# Patient Record
Sex: Female | Born: 1935
Health system: Southern US, Community
[De-identification: ages and names within clinical notes are randomized; demographics above are authoritative.]

## PROBLEM LIST (undated history)

## (undated) DIAGNOSIS — I509 Heart failure, unspecified: Secondary | ICD-10-CM

## (undated) DIAGNOSIS — Z86718 Personal history of other venous thrombosis and embolism: Secondary | ICD-10-CM

## (undated) DIAGNOSIS — I639 Cerebral infarction, unspecified: Secondary | ICD-10-CM

## (undated) DIAGNOSIS — Z8711 Personal history of peptic ulcer disease: Secondary | ICD-10-CM

## (undated) DIAGNOSIS — G43909 Migraine, unspecified, not intractable, without status migrainosus: Secondary | ICD-10-CM

## (undated) DIAGNOSIS — I10 Essential (primary) hypertension: Secondary | ICD-10-CM

## (undated) DIAGNOSIS — R634 Abnormal weight loss: Secondary | ICD-10-CM

## (undated) DIAGNOSIS — N3090 Cystitis, unspecified without hematuria: Secondary | ICD-10-CM

## (undated) DIAGNOSIS — J9620 Acute and chronic respiratory failure, unspecified whether with hypoxia or hypercapnia: Secondary | ICD-10-CM

## (undated) DIAGNOSIS — K625 Hemorrhage of anus and rectum: Secondary | ICD-10-CM

## (undated) DIAGNOSIS — I999 Unspecified disorder of circulatory system: Secondary | ICD-10-CM

## (undated) DIAGNOSIS — K279 Peptic ulcer, site unspecified, unspecified as acute or chronic, without hemorrhage or perforation: Secondary | ICD-10-CM

## (undated) DIAGNOSIS — G4700 Insomnia, unspecified: Secondary | ICD-10-CM

## (undated) DIAGNOSIS — J449 Chronic obstructive pulmonary disease, unspecified: Secondary | ICD-10-CM

## (undated) DIAGNOSIS — I723 Aneurysm of iliac artery: Secondary | ICD-10-CM

## (undated) DIAGNOSIS — E7800 Pure hypercholesterolemia, unspecified: Secondary | ICD-10-CM

## (undated) HISTORY — DX: Essential (primary) hypertension: I10

## (undated) HISTORY — DX: Peptic ulcer, site unspecified, unspecified as acute or chronic, without hemorrhage or perforation: K27.9

## (undated) HISTORY — DX: Cystitis, unspecified without hematuria: N30.90

## (undated) HISTORY — DX: Insomnia, unspecified: G47.00

## (undated) HISTORY — DX: Cerebral infarction, unspecified: I63.9

## (undated) HISTORY — DX: Abnormal weight loss: R63.4

## (undated) HISTORY — DX: Chronic obstructive pulmonary disease, unspecified: J44.9

## (undated) HISTORY — PX: APPENDECTOMY: SHX54

## (undated) HISTORY — PX: OTHER SURGICAL HISTORY: SHX169

## (undated) HISTORY — DX: Personal history of other venous thrombosis and embolism: Z86.718

## (undated) HISTORY — DX: Acute and chronic respiratory failure, unspecified whether with hypoxia or hypercapnia: J96.20

## (undated) HISTORY — DX: Pure hypercholesterolemia, unspecified: E78.00

## (undated) HISTORY — DX: Unspecified disorder of circulatory system: I99.9

## (undated) HISTORY — PX: PARTIAL HYSTERECTOMY: SHX80

## (undated) HISTORY — DX: Aneurysm of iliac artery: I72.3

## (undated) HISTORY — DX: Heart failure, unspecified: I50.9

## (undated) HISTORY — DX: Hemorrhage of anus and rectum: K62.5

## (undated) HISTORY — PX: ABDOMINAL HYSTERECTOMY: SHX81

## (undated) HISTORY — DX: Personal history of peptic ulcer disease: Z87.11

## (undated) HISTORY — DX: Migraine, unspecified, not intractable, without status migrainosus: G43.909

## (undated) HISTORY — PX: BACK SURGERY: SHX140

---

## 2004-07-04 ENCOUNTER — Ambulatory Visit: Payer: Self-pay | Admitting: Physician Assistant

## 2004-08-28 ENCOUNTER — Ambulatory Visit: Payer: Self-pay | Admitting: Family Medicine

## 2004-09-04 ENCOUNTER — Ambulatory Visit: Payer: Self-pay | Admitting: Family Medicine

## 2004-09-17 ENCOUNTER — Ambulatory Visit: Payer: Self-pay | Admitting: Family Medicine

## 2004-10-11 ENCOUNTER — Ambulatory Visit: Payer: Self-pay | Admitting: Family Medicine

## 2004-10-16 ENCOUNTER — Ambulatory Visit: Payer: Self-pay | Admitting: Physician Assistant

## 2004-12-19 ENCOUNTER — Ambulatory Visit: Payer: Self-pay

## 2004-12-20 ENCOUNTER — Ambulatory Visit: Payer: Self-pay

## 2005-03-26 ENCOUNTER — Ambulatory Visit: Payer: Self-pay | Admitting: Family Medicine

## 2005-03-29 ENCOUNTER — Ambulatory Visit: Payer: Self-pay | Admitting: Family Medicine

## 2005-04-25 ENCOUNTER — Ambulatory Visit: Payer: Self-pay | Admitting: Physician Assistant

## 2005-08-21 ENCOUNTER — Ambulatory Visit: Payer: Self-pay | Admitting: Physician Assistant

## 2005-12-18 ENCOUNTER — Ambulatory Visit: Payer: Self-pay | Admitting: Physician Assistant

## 2005-12-30 ENCOUNTER — Ambulatory Visit: Payer: Self-pay | Admitting: Pain Medicine

## 2005-12-31 ENCOUNTER — Ambulatory Visit: Payer: Self-pay | Admitting: Pain Medicine

## 2006-01-29 ENCOUNTER — Ambulatory Visit: Payer: Self-pay | Admitting: Physician Assistant

## 2006-04-14 ENCOUNTER — Ambulatory Visit: Payer: Self-pay | Admitting: Physician Assistant

## 2006-07-04 ENCOUNTER — Ambulatory Visit: Payer: Self-pay | Admitting: Family Medicine

## 2006-07-15 HISTORY — PX: ABDOMINAL AORTIC ANEURYSM REPAIR W/ ENDOLUMINAL GRAFT: SUR7

## 2006-07-15 HISTORY — PX: FEMORAL-POPLITEAL BYPASS GRAFT: SHX937

## 2006-07-29 ENCOUNTER — Ambulatory Visit: Payer: Self-pay | Admitting: Vascular Surgery

## 2006-08-04 ENCOUNTER — Ambulatory Visit: Payer: Self-pay | Admitting: Gastroenterology

## 2006-08-26 ENCOUNTER — Ambulatory Visit: Payer: Self-pay | Admitting: Vascular Surgery

## 2006-09-03 ENCOUNTER — Inpatient Hospital Stay: Payer: Self-pay | Admitting: Vascular Surgery

## 2006-09-15 ENCOUNTER — Ambulatory Visit: Payer: Self-pay | Admitting: Physician Assistant

## 2006-10-16 ENCOUNTER — Ambulatory Visit: Payer: Self-pay | Admitting: Physician Assistant

## 2007-01-07 ENCOUNTER — Ambulatory Visit: Payer: Self-pay | Admitting: Physician Assistant

## 2007-04-13 ENCOUNTER — Ambulatory Visit: Payer: Self-pay | Admitting: Physician Assistant

## 2007-10-21 ENCOUNTER — Ambulatory Visit: Payer: Self-pay | Admitting: Physician Assistant

## 2008-01-14 ENCOUNTER — Ambulatory Visit: Payer: Self-pay | Admitting: Physician Assistant

## 2008-04-14 ENCOUNTER — Ambulatory Visit: Payer: Self-pay | Admitting: Physician Assistant

## 2008-05-05 ENCOUNTER — Ambulatory Visit: Payer: Self-pay | Admitting: Family Medicine

## 2008-07-19 ENCOUNTER — Ambulatory Visit: Payer: Self-pay | Admitting: Physician Assistant

## 2008-10-13 ENCOUNTER — Ambulatory Visit: Payer: Self-pay | Admitting: Physician Assistant

## 2008-12-22 ENCOUNTER — Ambulatory Visit: Payer: Self-pay | Admitting: Physician Assistant

## 2008-12-29 ENCOUNTER — Ambulatory Visit: Payer: Self-pay | Admitting: Pain Medicine

## 2009-01-03 ENCOUNTER — Ambulatory Visit: Payer: Self-pay | Admitting: Physician Assistant

## 2009-02-15 ENCOUNTER — Ambulatory Visit: Payer: Self-pay | Admitting: Physician Assistant

## 2009-03-09 ENCOUNTER — Emergency Department: Payer: Self-pay

## 2009-05-17 ENCOUNTER — Ambulatory Visit: Payer: Self-pay | Admitting: Physician Assistant

## 2009-08-10 ENCOUNTER — Ambulatory Visit: Payer: Self-pay | Admitting: Physician Assistant

## 2009-10-27 ENCOUNTER — Ambulatory Visit: Payer: Self-pay | Admitting: Pain Medicine

## 2010-04-12 ENCOUNTER — Ambulatory Visit: Payer: Self-pay | Admitting: Pain Medicine

## 2010-04-26 ENCOUNTER — Ambulatory Visit: Payer: Self-pay | Admitting: Pain Medicine

## 2010-05-22 ENCOUNTER — Ambulatory Visit: Payer: Self-pay | Admitting: Pain Medicine

## 2010-05-24 ENCOUNTER — Ambulatory Visit: Payer: Self-pay | Admitting: Nurse Practitioner

## 2010-06-11 ENCOUNTER — Ambulatory Visit: Payer: Self-pay | Admitting: Pain Medicine

## 2010-07-15 HISTORY — PX: COLONOSCOPY: SHX174

## 2010-08-28 ENCOUNTER — Ambulatory Visit: Payer: Self-pay | Admitting: Gastroenterology

## 2010-09-21 ENCOUNTER — Ambulatory Visit: Payer: Self-pay | Admitting: Gastroenterology

## 2010-10-01 ENCOUNTER — Ambulatory Visit: Payer: Self-pay | Admitting: Pain Medicine

## 2010-11-12 ENCOUNTER — Ambulatory Visit: Payer: Self-pay | Admitting: Unknown Physician Specialty

## 2010-11-14 LAB — PATHOLOGY REPORT

## 2010-12-31 ENCOUNTER — Ambulatory Visit: Payer: Self-pay | Admitting: Pain Medicine

## 2011-03-25 ENCOUNTER — Ambulatory Visit: Payer: Self-pay | Admitting: Pain Medicine

## 2011-06-24 ENCOUNTER — Ambulatory Visit: Payer: Self-pay | Admitting: Pain Medicine

## 2011-07-30 DIAGNOSIS — H26019 Infantile and juvenile cortical, lamellar, or zonular cataract, unspecified eye: Secondary | ICD-10-CM | POA: Diagnosis not present

## 2011-10-01 DIAGNOSIS — E7800 Pure hypercholesterolemia, unspecified: Secondary | ICD-10-CM | POA: Diagnosis not present

## 2011-10-01 DIAGNOSIS — R197 Diarrhea, unspecified: Secondary | ICD-10-CM | POA: Diagnosis not present

## 2011-10-01 DIAGNOSIS — I10 Essential (primary) hypertension: Secondary | ICD-10-CM | POA: Diagnosis not present

## 2011-10-01 DIAGNOSIS — Z79899 Other long term (current) drug therapy: Secondary | ICD-10-CM | POA: Diagnosis not present

## 2011-10-03 DIAGNOSIS — R197 Diarrhea, unspecified: Secondary | ICD-10-CM | POA: Diagnosis not present

## 2011-10-23 ENCOUNTER — Ambulatory Visit: Payer: Self-pay | Admitting: Pain Medicine

## 2011-10-23 DIAGNOSIS — I714 Abdominal aortic aneurysm, without rupture: Secondary | ICD-10-CM | POA: Diagnosis not present

## 2011-10-23 DIAGNOSIS — Z79899 Other long term (current) drug therapy: Secondary | ICD-10-CM | POA: Diagnosis not present

## 2011-10-23 DIAGNOSIS — M545 Low back pain, unspecified: Secondary | ICD-10-CM | POA: Diagnosis not present

## 2011-10-23 DIAGNOSIS — M48061 Spinal stenosis, lumbar region without neurogenic claudication: Secondary | ICD-10-CM | POA: Diagnosis not present

## 2011-10-23 DIAGNOSIS — G8929 Other chronic pain: Secondary | ICD-10-CM | POA: Diagnosis not present

## 2011-10-23 DIAGNOSIS — E7800 Pure hypercholesterolemia, unspecified: Secondary | ICD-10-CM | POA: Diagnosis not present

## 2011-10-23 DIAGNOSIS — M47817 Spondylosis without myelopathy or radiculopathy, lumbosacral region: Secondary | ICD-10-CM | POA: Diagnosis not present

## 2011-10-23 DIAGNOSIS — M4120 Other idiopathic scoliosis, site unspecified: Secondary | ICD-10-CM | POA: Diagnosis not present

## 2011-10-23 DIAGNOSIS — M79609 Pain in unspecified limb: Secondary | ICD-10-CM | POA: Diagnosis not present

## 2011-10-23 DIAGNOSIS — M5106 Intervertebral disc disorders with myelopathy, lumbar region: Secondary | ICD-10-CM | POA: Diagnosis not present

## 2011-10-23 DIAGNOSIS — I10 Essential (primary) hypertension: Secondary | ICD-10-CM | POA: Diagnosis not present

## 2011-10-23 DIAGNOSIS — F17200 Nicotine dependence, unspecified, uncomplicated: Secondary | ICD-10-CM | POA: Diagnosis not present

## 2011-10-23 DIAGNOSIS — IMO0001 Reserved for inherently not codable concepts without codable children: Secondary | ICD-10-CM | POA: Diagnosis not present

## 2011-11-12 ENCOUNTER — Ambulatory Visit: Payer: Self-pay | Admitting: Internal Medicine

## 2011-11-12 ENCOUNTER — Inpatient Hospital Stay: Payer: Self-pay | Admitting: Internal Medicine

## 2011-11-12 DIAGNOSIS — R6889 Other general symptoms and signs: Secondary | ICD-10-CM | POA: Diagnosis not present

## 2011-11-12 DIAGNOSIS — J449 Chronic obstructive pulmonary disease, unspecified: Secondary | ICD-10-CM | POA: Diagnosis not present

## 2011-11-12 DIAGNOSIS — I714 Abdominal aortic aneurysm, without rupture, unspecified: Secondary | ICD-10-CM | POA: Diagnosis not present

## 2011-11-12 DIAGNOSIS — I70229 Atherosclerosis of native arteries of extremities with rest pain, unspecified extremity: Secondary | ICD-10-CM | POA: Diagnosis not present

## 2011-11-12 DIAGNOSIS — I739 Peripheral vascular disease, unspecified: Secondary | ICD-10-CM | POA: Diagnosis not present

## 2011-11-12 DIAGNOSIS — T82898A Other specified complication of vascular prosthetic devices, implants and grafts, initial encounter: Secondary | ICD-10-CM | POA: Diagnosis not present

## 2011-11-12 DIAGNOSIS — T82598A Other mechanical complication of other cardiac and vascular devices and implants, initial encounter: Secondary | ICD-10-CM | POA: Diagnosis not present

## 2011-11-12 DIAGNOSIS — Z6379 Other stressful life events affecting family and household: Secondary | ICD-10-CM | POA: Diagnosis not present

## 2011-11-12 DIAGNOSIS — I701 Atherosclerosis of renal artery: Secondary | ICD-10-CM | POA: Diagnosis not present

## 2011-11-12 DIAGNOSIS — Z9889 Other specified postprocedural states: Secondary | ICD-10-CM | POA: Diagnosis not present

## 2011-11-12 DIAGNOSIS — N390 Urinary tract infection, site not specified: Secondary | ICD-10-CM | POA: Diagnosis not present

## 2011-11-12 DIAGNOSIS — J4489 Other specified chronic obstructive pulmonary disease: Secondary | ICD-10-CM | POA: Diagnosis not present

## 2011-11-12 DIAGNOSIS — E042 Nontoxic multinodular goiter: Secondary | ICD-10-CM | POA: Diagnosis not present

## 2011-11-12 DIAGNOSIS — I999 Unspecified disorder of circulatory system: Secondary | ICD-10-CM | POA: Diagnosis not present

## 2011-11-12 DIAGNOSIS — E785 Hyperlipidemia, unspecified: Secondary | ICD-10-CM | POA: Diagnosis not present

## 2011-11-12 DIAGNOSIS — I743 Embolism and thrombosis of arteries of the lower extremities: Secondary | ICD-10-CM | POA: Diagnosis not present

## 2011-11-12 DIAGNOSIS — D649 Anemia, unspecified: Secondary | ICD-10-CM | POA: Diagnosis not present

## 2011-11-12 DIAGNOSIS — M79609 Pain in unspecified limb: Secondary | ICD-10-CM | POA: Diagnosis not present

## 2011-11-12 DIAGNOSIS — Z87891 Personal history of nicotine dependence: Secondary | ICD-10-CM | POA: Diagnosis not present

## 2011-11-12 DIAGNOSIS — R918 Other nonspecific abnormal finding of lung field: Secondary | ICD-10-CM | POA: Diagnosis not present

## 2011-11-12 DIAGNOSIS — I70209 Unspecified atherosclerosis of native arteries of extremities, unspecified extremity: Secondary | ICD-10-CM | POA: Diagnosis not present

## 2011-11-12 DIAGNOSIS — Z79899 Other long term (current) drug therapy: Secondary | ICD-10-CM | POA: Diagnosis not present

## 2011-11-12 DIAGNOSIS — R109 Unspecified abdominal pain: Secondary | ICD-10-CM | POA: Diagnosis not present

## 2011-11-12 DIAGNOSIS — R0902 Hypoxemia: Secondary | ICD-10-CM | POA: Diagnosis not present

## 2011-11-12 DIAGNOSIS — Z90710 Acquired absence of both cervix and uterus: Secondary | ICD-10-CM | POA: Diagnosis not present

## 2011-11-12 DIAGNOSIS — I779 Disorder of arteries and arterioles, unspecified: Secondary | ICD-10-CM | POA: Diagnosis not present

## 2011-11-12 DIAGNOSIS — I10 Essential (primary) hypertension: Secondary | ICD-10-CM | POA: Diagnosis not present

## 2011-11-12 DIAGNOSIS — Z87440 Personal history of urinary (tract) infections: Secondary | ICD-10-CM | POA: Diagnosis not present

## 2011-11-12 DIAGNOSIS — Z808 Family history of malignant neoplasm of other organs or systems: Secondary | ICD-10-CM | POA: Diagnosis not present

## 2011-11-12 LAB — URINALYSIS, COMPLETE
Bacteria: NONE SEEN
Bilirubin,UR: NEGATIVE
Nitrite: POSITIVE
Ph: 7 (ref 4.5–8.0)
RBC,UR: >30 /HPF (ref 0–5)
RBC,UR: 2 /HPF (ref 0–5)
Squamous Epithelial: NONE SEEN
Squamous Epithelial: NONE SEEN
WBC UR: 18 /HPF (ref 0–5)

## 2011-11-12 LAB — COMPREHENSIVE METABOLIC PANEL
Albumin: 3.7 g/dL (ref 3.4–5.0)
Anion Gap: 5 — ABNORMAL LOW (ref 7–16)
Bilirubin,Total: 0.4 mg/dL (ref 0.2–1.0)
Calcium, Total: 8.9 mg/dL (ref 8.5–10.1)
Chloride: 107 mmol/L (ref 98–107)
Co2: 27 mmol/L (ref 21–32)
Creatinine: 0.99 mg/dL (ref 0.60–1.30)
EGFR (African American): >60
EGFR (Non-African Amer.): 54 — ABNORMAL LOW
Glucose: 104 mg/dL — ABNORMAL HIGH (ref 65–99)
Potassium: 4.6 mmol/L (ref 3.5–5.1)
SGOT(AST): 19 U/L (ref 15–37)
SGPT (ALT): 13 U/L
Sodium: 139 mmol/L (ref 136–145)

## 2011-11-12 LAB — PROTIME-INR
INR: 1
Prothrombin Time: 13.5 secs (ref 11.5–14.7)

## 2011-11-12 LAB — CBC
MCH: 26.2 pg (ref 26.0–34.0)
MCV: 82 fL (ref 80–100)
Platelet: 439 10*3/uL (ref 150–440)
RBC: 4.47 10*6/uL (ref 3.80–5.20)
RDW: 16.7 % — ABNORMAL HIGH (ref 11.5–14.5)

## 2011-11-12 LAB — APTT: Activated PTT: 34.5 secs (ref 23.6–35.9)

## 2011-11-13 LAB — CBC WITH DIFFERENTIAL/PLATELET
Basophil #: 0 10*3/uL (ref 0.0–0.1)
Eosinophil #: 0.1 10*3/uL (ref 0.0–0.7)
Eosinophil %: 1.7 %
HGB: 10 g/dL — ABNORMAL LOW (ref 12.0–16.0)
Lymphocyte %: 29.6 %
Monocyte %: 8.7 %
Neutrophil %: 59.5 %
WBC: 8.1 10*3/uL (ref 3.6–11.0)

## 2011-11-13 LAB — URINALYSIS, COMPLETE
Leukocyte Esterase: NEGATIVE
RBC,UR: 4 /HPF (ref 0–5)
Specific Gravity: 1.046 (ref 1.003–1.030)
Squamous Epithelial: NONE SEEN
WBC UR: 3 /HPF (ref 0–5)

## 2011-11-13 LAB — LIPID PANEL
Cholesterol: 99 mg/dL (ref 0–200)
HDL Cholesterol: 29 mg/dL — ABNORMAL LOW (ref 40–60)
Ldl Cholesterol, Calc: 37 mg/dL (ref 0–100)
VLDL Cholesterol, Calc: 33 mg/dL (ref 5–40)

## 2011-11-13 LAB — TSH: Thyroid Stimulating Horm: 0.431 u[IU]/mL — ABNORMAL LOW

## 2011-11-13 LAB — BASIC METABOLIC PANEL
BUN: 8 mg/dL (ref 7–18)
Chloride: 112 mmol/L — ABNORMAL HIGH (ref 98–107)
Co2: 23 mmol/L (ref 21–32)
Creatinine: 0.89 mg/dL (ref 0.60–1.30)
EGFR (African American): >60
EGFR (Non-African Amer.): >60

## 2011-11-13 LAB — APTT: Activated PTT: 75.2 secs — ABNORMAL HIGH (ref 23.6–35.9)

## 2011-11-14 LAB — BASIC METABOLIC PANEL
Anion Gap: 8 (ref 7–16)
BUN: 8 mg/dL (ref 7–18)
Calcium, Total: 8 mg/dL — ABNORMAL LOW (ref 8.5–10.1)
Co2: 21 mmol/L (ref 21–32)
EGFR (Non-African Amer.): 57 — ABNORMAL LOW
Glucose: 118 mg/dL — ABNORMAL HIGH (ref 65–99)
Osmolality: 284 (ref 275–301)
Potassium: 4 mmol/L (ref 3.5–5.1)
Sodium: 143 mmol/L (ref 136–145)

## 2011-11-14 LAB — T4, FREE: Free Thyroxine: 0.92 ng/dL (ref 0.76–1.46)

## 2011-11-14 LAB — APTT: Activated PTT: 83.8 secs — ABNORMAL HIGH (ref 23.6–35.9)

## 2011-11-15 LAB — APTT: Activated PTT: 110 secs — ABNORMAL HIGH (ref 23.6–35.9)

## 2011-11-15 LAB — CBC WITH DIFFERENTIAL/PLATELET
Basophil #: 0 10*3/uL (ref 0.0–0.1)
Basophil %: 0.5 %
Eosinophil %: 1.8 %
HGB: 7.8 g/dL — ABNORMAL LOW (ref 12.0–16.0)
Lymphocyte %: 33.9 %
MCHC: 32.1 g/dL (ref 32.0–36.0)
MCV: 83 fL (ref 80–100)
Neutrophil #: 4.5 10*3/uL (ref 1.4–6.5)
Neutrophil %: 55.6 %
RBC: 2.94 10*6/uL — ABNORMAL LOW (ref 3.80–5.20)
WBC: 8.1 10*3/uL (ref 3.6–11.0)

## 2011-11-15 LAB — URINE CULTURE

## 2011-11-16 LAB — BASIC METABOLIC PANEL
BUN: 7 mg/dL (ref 7–18)
Chloride: 110 mmol/L — ABNORMAL HIGH (ref 98–107)
Co2: 25 mmol/L (ref 21–32)
Glucose: 93 mg/dL (ref 65–99)
Osmolality: 285 (ref 275–301)
Potassium: 3.4 mmol/L — ABNORMAL LOW (ref 3.5–5.1)
Sodium: 144 mmol/L (ref 136–145)

## 2011-11-16 LAB — HEMOGLOBIN: HGB: 9.7 g/dL — ABNORMAL LOW (ref 12.0–16.0)

## 2011-11-25 DIAGNOSIS — J449 Chronic obstructive pulmonary disease, unspecified: Secondary | ICD-10-CM | POA: Diagnosis not present

## 2011-11-25 DIAGNOSIS — I739 Peripheral vascular disease, unspecified: Secondary | ICD-10-CM | POA: Diagnosis not present

## 2011-11-25 DIAGNOSIS — I10 Essential (primary) hypertension: Secondary | ICD-10-CM | POA: Diagnosis not present

## 2011-11-25 DIAGNOSIS — F17200 Nicotine dependence, unspecified, uncomplicated: Secondary | ICD-10-CM | POA: Diagnosis not present

## 2011-12-06 DIAGNOSIS — E042 Nontoxic multinodular goiter: Secondary | ICD-10-CM | POA: Diagnosis not present

## 2011-12-12 DIAGNOSIS — I739 Peripheral vascular disease, unspecified: Secondary | ICD-10-CM | POA: Diagnosis not present

## 2011-12-12 DIAGNOSIS — I714 Abdominal aortic aneurysm, without rupture: Secondary | ICD-10-CM | POA: Diagnosis not present

## 2011-12-30 DIAGNOSIS — E0520 Thyrotoxicosis with toxic multinodular goiter without thyrotoxic crisis or storm: Secondary | ICD-10-CM | POA: Diagnosis not present

## 2011-12-30 DIAGNOSIS — E041 Nontoxic single thyroid nodule: Secondary | ICD-10-CM | POA: Diagnosis not present

## 2012-01-21 DIAGNOSIS — D649 Anemia, unspecified: Secondary | ICD-10-CM | POA: Diagnosis not present

## 2012-01-21 DIAGNOSIS — K277 Chronic peptic ulcer, site unspecified, without hemorrhage or perforation: Secondary | ICD-10-CM | POA: Diagnosis not present

## 2012-01-21 DIAGNOSIS — E785 Hyperlipidemia, unspecified: Secondary | ICD-10-CM | POA: Diagnosis not present

## 2012-01-21 DIAGNOSIS — I10 Essential (primary) hypertension: Secondary | ICD-10-CM | POA: Diagnosis not present

## 2012-01-27 DIAGNOSIS — N390 Urinary tract infection, site not specified: Secondary | ICD-10-CM | POA: Diagnosis not present

## 2012-02-03 ENCOUNTER — Ambulatory Visit: Payer: Self-pay | Admitting: Pain Medicine

## 2012-02-03 DIAGNOSIS — M545 Low back pain, unspecified: Secondary | ICD-10-CM | POA: Diagnosis not present

## 2012-02-03 DIAGNOSIS — G8929 Other chronic pain: Secondary | ICD-10-CM | POA: Diagnosis not present

## 2012-02-03 DIAGNOSIS — M5137 Other intervertebral disc degeneration, lumbosacral region: Secondary | ICD-10-CM | POA: Diagnosis not present

## 2012-02-03 DIAGNOSIS — M4716 Other spondylosis with myelopathy, lumbar region: Secondary | ICD-10-CM | POA: Diagnosis not present

## 2012-02-03 DIAGNOSIS — Z79899 Other long term (current) drug therapy: Secondary | ICD-10-CM | POA: Diagnosis not present

## 2012-02-03 DIAGNOSIS — I714 Abdominal aortic aneurysm, without rupture: Secondary | ICD-10-CM | POA: Diagnosis not present

## 2012-02-03 DIAGNOSIS — J449 Chronic obstructive pulmonary disease, unspecified: Secondary | ICD-10-CM | POA: Diagnosis not present

## 2012-02-03 DIAGNOSIS — IMO0001 Reserved for inherently not codable concepts without codable children: Secondary | ICD-10-CM | POA: Diagnosis not present

## 2012-02-03 DIAGNOSIS — Z9889 Other specified postprocedural states: Secondary | ICD-10-CM | POA: Diagnosis not present

## 2012-02-03 DIAGNOSIS — F17200 Nicotine dependence, unspecified, uncomplicated: Secondary | ICD-10-CM | POA: Diagnosis not present

## 2012-02-03 DIAGNOSIS — I10 Essential (primary) hypertension: Secondary | ICD-10-CM | POA: Diagnosis not present

## 2012-03-17 DIAGNOSIS — I70219 Atherosclerosis of native arteries of extremities with intermittent claudication, unspecified extremity: Secondary | ICD-10-CM | POA: Diagnosis not present

## 2012-03-17 DIAGNOSIS — I714 Abdominal aortic aneurysm, without rupture: Secondary | ICD-10-CM | POA: Diagnosis not present

## 2012-04-27 ENCOUNTER — Ambulatory Visit: Payer: Self-pay | Admitting: Pain Medicine

## 2012-04-27 DIAGNOSIS — IMO0001 Reserved for inherently not codable concepts without codable children: Secondary | ICD-10-CM | POA: Diagnosis not present

## 2012-04-27 DIAGNOSIS — Z79899 Other long term (current) drug therapy: Secondary | ICD-10-CM | POA: Diagnosis not present

## 2012-04-27 DIAGNOSIS — M4716 Other spondylosis with myelopathy, lumbar region: Secondary | ICD-10-CM | POA: Diagnosis not present

## 2012-04-27 DIAGNOSIS — M25559 Pain in unspecified hip: Secondary | ICD-10-CM | POA: Diagnosis not present

## 2012-04-27 DIAGNOSIS — M5137 Other intervertebral disc degeneration, lumbosacral region: Secondary | ICD-10-CM | POA: Diagnosis not present

## 2012-04-27 DIAGNOSIS — M79609 Pain in unspecified limb: Secondary | ICD-10-CM | POA: Diagnosis not present

## 2012-04-27 DIAGNOSIS — G8929 Other chronic pain: Secondary | ICD-10-CM | POA: Diagnosis not present

## 2012-04-27 DIAGNOSIS — M545 Low back pain: Secondary | ICD-10-CM | POA: Diagnosis not present

## 2012-06-11 IMAGING — XA IR VASCULAR PROCEDURE
14 of 21 series · 15 of 24 positions shown · IV contrast (IODINE)
Comparison: none

[Series 1: aorta · 1 of 2 slices shown (1 of 3)]
[im 1/2]
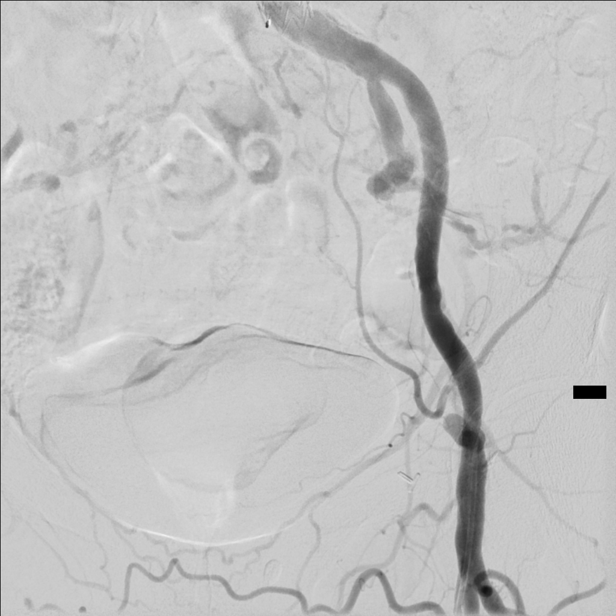

[Series 2: aorta · 1 of 2 slices shown (2 of 3)]
[im 1/2]
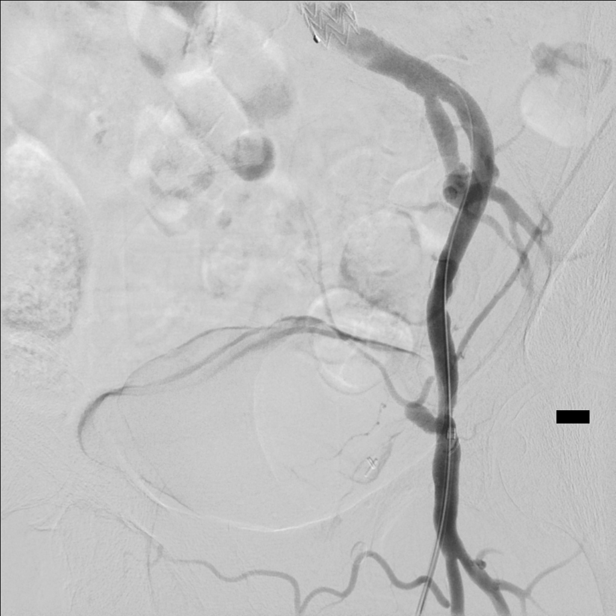

[Series 3: aorta · 2 of 2 slices shown (3 of 3)]
[im 1/2]
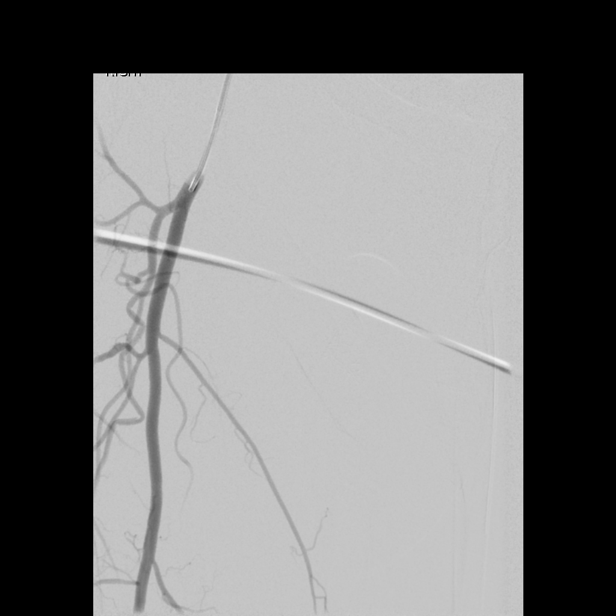
[im 2/2]
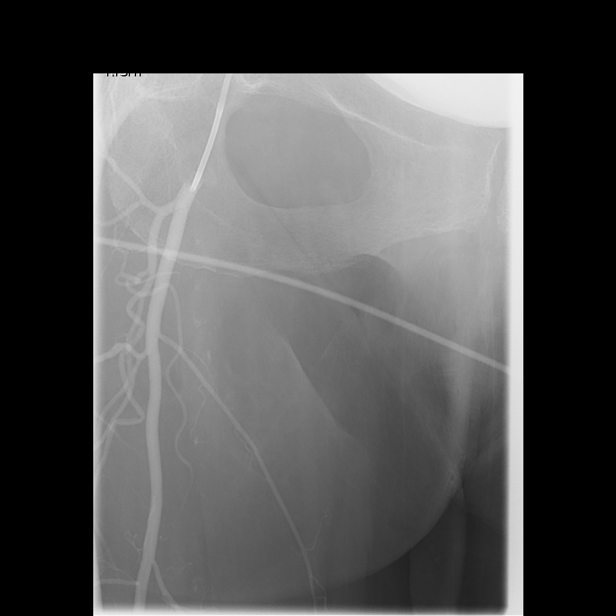

[Series 5: care sfa · 1 of 2 slices shown (1 of 8)]
[im 1/2]
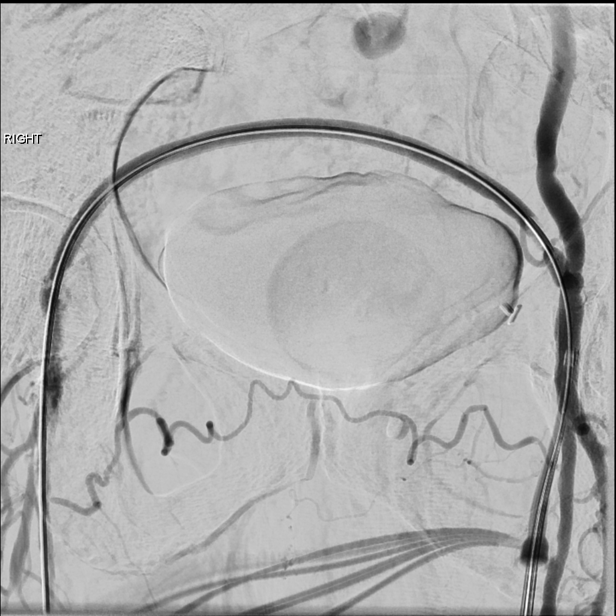

[Series 6: fl - angio · 1 of 1 slices shown (1 of 3)]
[im 1/1]
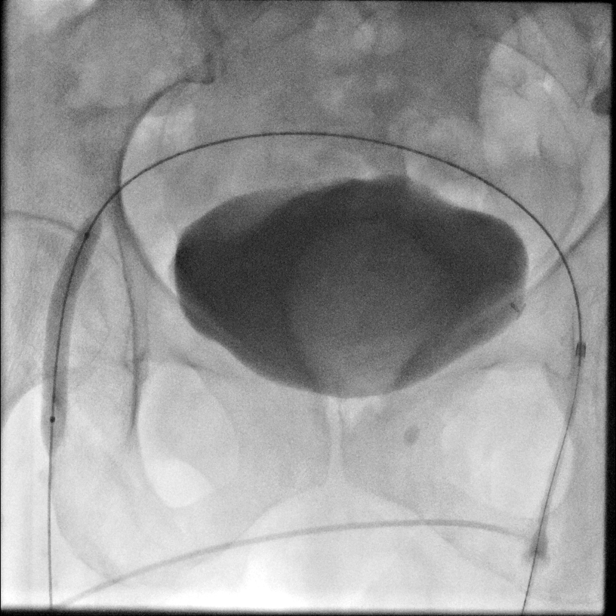

[Series 8: care sfa · 1 of 2 slices shown (2 of 8)]
[im 1/2]
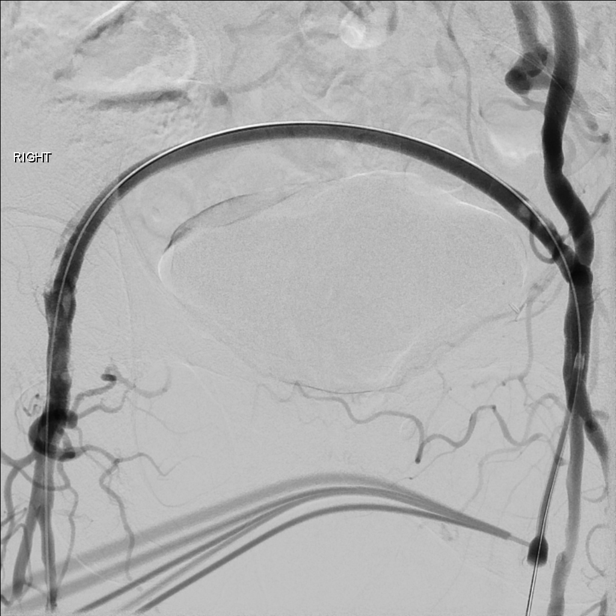

[Series 10: care sfa · 1 of 2 slices shown (3 of 8)]
[im 1/2]
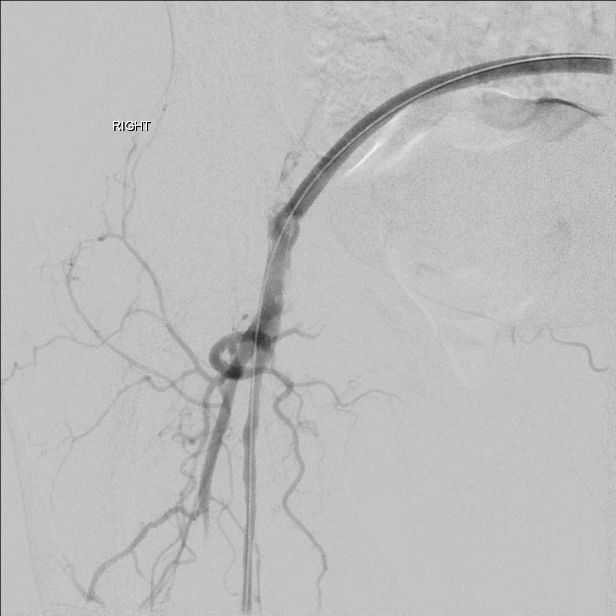

[Series 11: care sfa · 1 of 2 slices shown (4 of 8)]
[im 1/2]
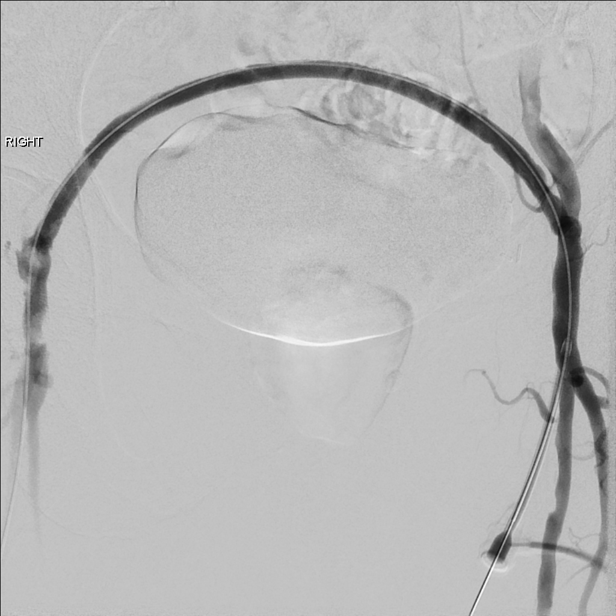

[Series 14: fl - angio · 1 of 1 slices shown (2 of 3)]
[im 1/1]
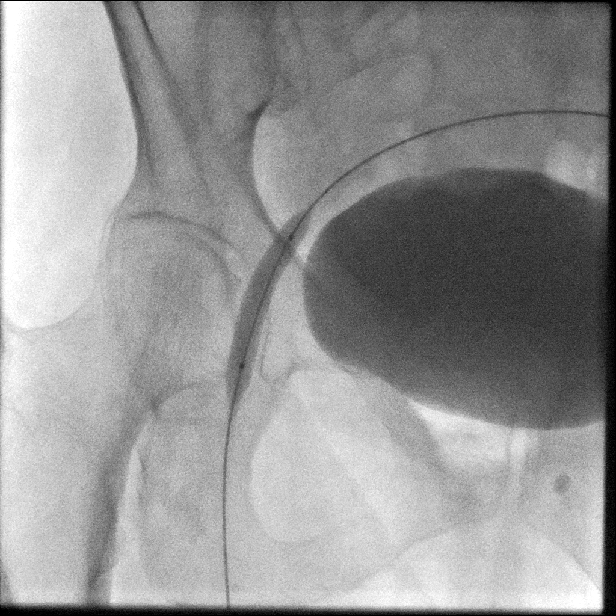

[Series 15: care sfa · 1 of 2 slices shown (5 of 8)]
[im 1/2]
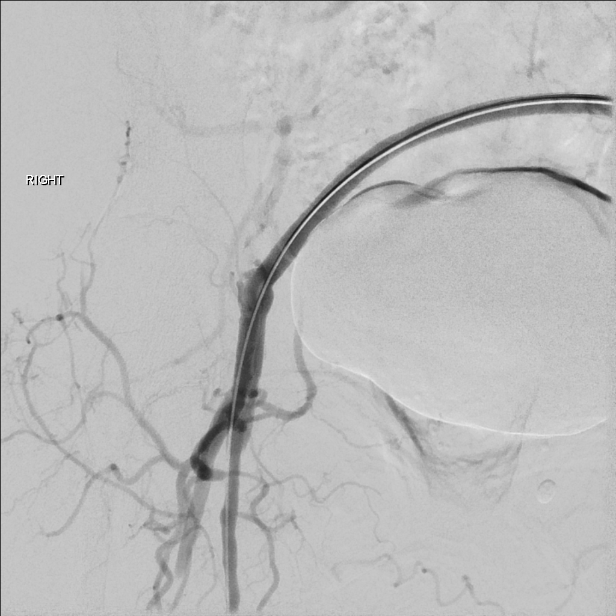

[Series 17: care sfa · 1 of 2 slices shown (6 of 8)]
[im 1/2]
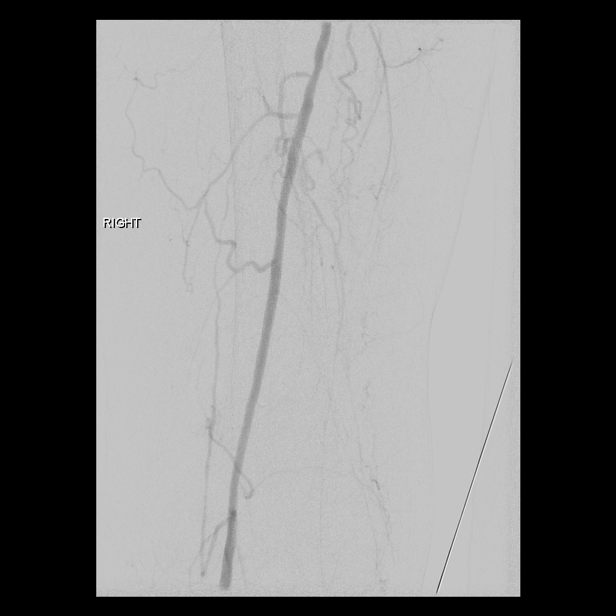

[Series 19: care sfa · 1 of 2 slices shown (7 of 8)]
[im 1/2]
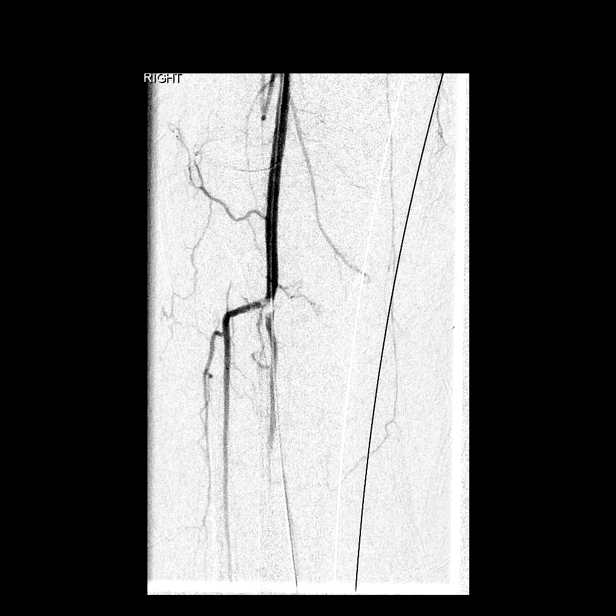

[Series 20: fl - angio · 1 of 1 slices shown (3 of 3)]
[im 1/1]
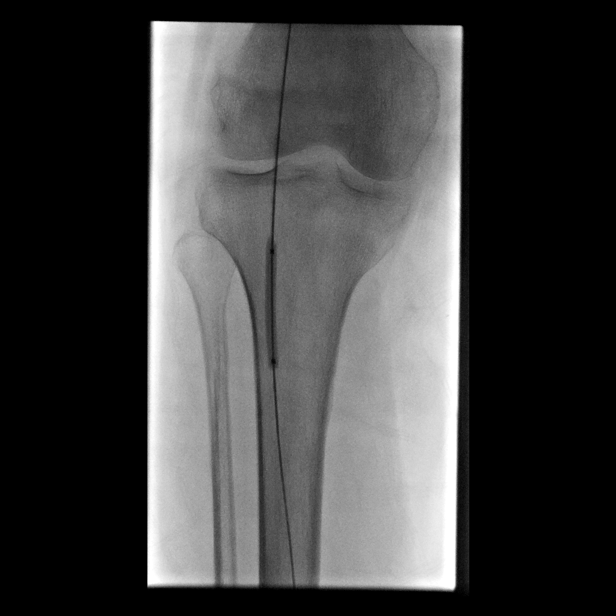

[Series 22: care sfa · 1 of 2 slices shown (8 of 8)]
[im 1/2]
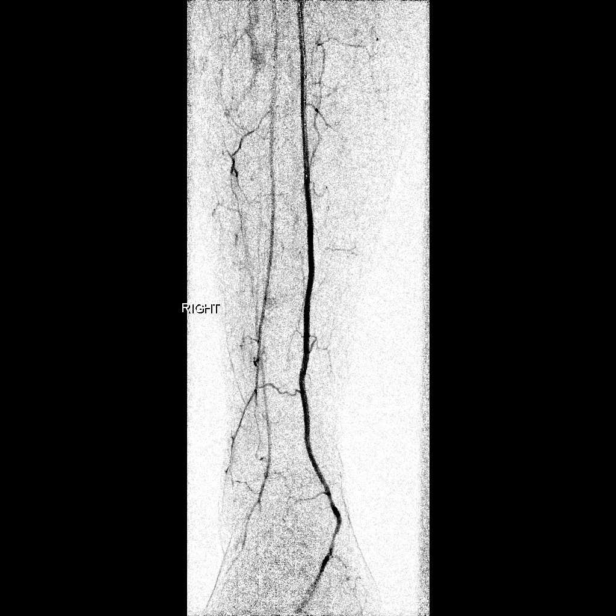

[15 of 24 positions shown; findings below may reference images not displayed]

IMAGES IMPORTED FROM THE [employer] WORKFLOW SYSTEM
NO DICTATION FOR STUDY

## 2012-08-03 ENCOUNTER — Emergency Department: Payer: Self-pay | Admitting: Emergency Medicine

## 2012-08-03 DIAGNOSIS — N10 Acute tubulo-interstitial nephritis: Secondary | ICD-10-CM | POA: Diagnosis not present

## 2012-08-03 DIAGNOSIS — R9431 Abnormal electrocardiogram [ECG] [EKG]: Secondary | ICD-10-CM | POA: Diagnosis not present

## 2012-08-03 DIAGNOSIS — N12 Tubulo-interstitial nephritis, not specified as acute or chronic: Secondary | ICD-10-CM | POA: Diagnosis not present

## 2012-08-03 LAB — COMPREHENSIVE METABOLIC PANEL
Alkaline Phosphatase: 123 U/L (ref 50–136)
BUN: 10 mg/dL (ref 7–18)
Bilirubin,Total: 0.3 mg/dL (ref 0.2–1.0)
Co2: 24 mmol/L (ref 21–32)
EGFR (Non-African Amer.): 52 — ABNORMAL LOW
Osmolality: 269 (ref 275–301)
Potassium: 3.9 mmol/L (ref 3.5–5.1)
Total Protein: 7.6 g/dL (ref 6.4–8.2)

## 2012-08-03 LAB — URINALYSIS, COMPLETE
Bacteria: NONE SEEN
Bilirubin,UR: NEGATIVE
Glucose,UR: NEGATIVE mg/dL (ref 0–75)
Nitrite: POSITIVE
Ph: 6 (ref 4.5–8.0)
Protein: NEGATIVE
RBC,UR: 2 /HPF (ref 0–5)
Specific Gravity: 1.009 (ref 1.003–1.030)
WBC UR: 315 /HPF (ref 0–5)

## 2012-08-03 LAB — CBC
HCT: 32.6 % — ABNORMAL LOW (ref 35.0–47.0)
MCV: 75 fL — ABNORMAL LOW (ref 80–100)
RBC: 4.35 10*6/uL (ref 3.80–5.20)
RDW: 17.8 % — ABNORMAL HIGH (ref 11.5–14.5)

## 2012-08-03 LAB — TROPONIN I: Troponin-I: <0.02 ng/mL

## 2012-08-31 ENCOUNTER — Ambulatory Visit: Payer: Self-pay | Admitting: Pain Medicine

## 2012-08-31 DIAGNOSIS — I10 Essential (primary) hypertension: Secondary | ICD-10-CM | POA: Diagnosis not present

## 2012-08-31 DIAGNOSIS — M4716 Other spondylosis with myelopathy, lumbar region: Secondary | ICD-10-CM | POA: Diagnosis not present

## 2012-08-31 DIAGNOSIS — I714 Abdominal aortic aneurysm, without rupture: Secondary | ICD-10-CM | POA: Diagnosis not present

## 2012-08-31 DIAGNOSIS — Z7902 Long term (current) use of antithrombotics/antiplatelets: Secondary | ICD-10-CM | POA: Diagnosis not present

## 2012-08-31 DIAGNOSIS — Z79899 Other long term (current) drug therapy: Secondary | ICD-10-CM | POA: Diagnosis not present

## 2012-08-31 DIAGNOSIS — J449 Chronic obstructive pulmonary disease, unspecified: Secondary | ICD-10-CM | POA: Diagnosis not present

## 2012-08-31 DIAGNOSIS — G8929 Other chronic pain: Secondary | ICD-10-CM | POA: Diagnosis not present

## 2012-08-31 DIAGNOSIS — F17200 Nicotine dependence, unspecified, uncomplicated: Secondary | ICD-10-CM | POA: Diagnosis not present

## 2012-08-31 DIAGNOSIS — Z86718 Personal history of other venous thrombosis and embolism: Secondary | ICD-10-CM | POA: Diagnosis not present

## 2012-08-31 DIAGNOSIS — G894 Chronic pain syndrome: Secondary | ICD-10-CM | POA: Diagnosis not present

## 2012-08-31 DIAGNOSIS — F449 Dissociative and conversion disorder, unspecified: Secondary | ICD-10-CM | POA: Diagnosis not present

## 2012-08-31 DIAGNOSIS — M5137 Other intervertebral disc degeneration, lumbosacral region: Secondary | ICD-10-CM | POA: Diagnosis not present

## 2012-10-14 DIAGNOSIS — I10 Essential (primary) hypertension: Secondary | ICD-10-CM | POA: Diagnosis not present

## 2012-10-14 DIAGNOSIS — E785 Hyperlipidemia, unspecified: Secondary | ICD-10-CM | POA: Diagnosis not present

## 2012-10-14 DIAGNOSIS — I739 Peripheral vascular disease, unspecified: Secondary | ICD-10-CM | POA: Diagnosis not present

## 2012-10-14 DIAGNOSIS — I714 Abdominal aortic aneurysm, without rupture: Secondary | ICD-10-CM | POA: Diagnosis not present

## 2012-10-27 DIAGNOSIS — I10 Essential (primary) hypertension: Secondary | ICD-10-CM | POA: Diagnosis not present

## 2012-10-27 DIAGNOSIS — G44229 Chronic tension-type headache, not intractable: Secondary | ICD-10-CM | POA: Diagnosis not present

## 2012-10-27 DIAGNOSIS — G4700 Insomnia, unspecified: Secondary | ICD-10-CM | POA: Diagnosis not present

## 2012-10-27 DIAGNOSIS — E785 Hyperlipidemia, unspecified: Secondary | ICD-10-CM | POA: Diagnosis not present

## 2012-12-24 DIAGNOSIS — Z79899 Other long term (current) drug therapy: Secondary | ICD-10-CM | POA: Diagnosis not present

## 2012-12-24 DIAGNOSIS — G894 Chronic pain syndrome: Secondary | ICD-10-CM | POA: Diagnosis not present

## 2012-12-25 ENCOUNTER — Ambulatory Visit: Payer: Self-pay | Admitting: Pain Medicine

## 2012-12-25 DIAGNOSIS — I10 Essential (primary) hypertension: Secondary | ICD-10-CM | POA: Diagnosis not present

## 2012-12-25 DIAGNOSIS — F17200 Nicotine dependence, unspecified, uncomplicated: Secondary | ICD-10-CM | POA: Diagnosis not present

## 2012-12-25 DIAGNOSIS — M4716 Other spondylosis with myelopathy, lumbar region: Secondary | ICD-10-CM | POA: Diagnosis not present

## 2012-12-25 DIAGNOSIS — G8929 Other chronic pain: Secondary | ICD-10-CM | POA: Diagnosis not present

## 2012-12-25 DIAGNOSIS — J449 Chronic obstructive pulmonary disease, unspecified: Secondary | ICD-10-CM | POA: Diagnosis not present

## 2012-12-25 DIAGNOSIS — Z86718 Personal history of other venous thrombosis and embolism: Secondary | ICD-10-CM | POA: Diagnosis not present

## 2012-12-25 DIAGNOSIS — IMO0001 Reserved for inherently not codable concepts without codable children: Secondary | ICD-10-CM | POA: Diagnosis not present

## 2012-12-25 DIAGNOSIS — I714 Abdominal aortic aneurysm, without rupture: Secondary | ICD-10-CM | POA: Diagnosis not present

## 2012-12-25 DIAGNOSIS — M545 Low back pain: Secondary | ICD-10-CM | POA: Diagnosis not present

## 2012-12-25 DIAGNOSIS — Z79899 Other long term (current) drug therapy: Secondary | ICD-10-CM | POA: Diagnosis not present

## 2012-12-25 DIAGNOSIS — G894 Chronic pain syndrome: Secondary | ICD-10-CM | POA: Diagnosis not present

## 2012-12-25 DIAGNOSIS — M5137 Other intervertebral disc degeneration, lumbosacral region: Secondary | ICD-10-CM | POA: Diagnosis not present

## 2013-01-06 DIAGNOSIS — F329 Major depressive disorder, single episode, unspecified: Secondary | ICD-10-CM | POA: Diagnosis not present

## 2013-01-06 DIAGNOSIS — I32 Pericarditis in diseases classified elsewhere: Secondary | ICD-10-CM | POA: Diagnosis not present

## 2013-01-06 DIAGNOSIS — D649 Anemia, unspecified: Secondary | ICD-10-CM | POA: Diagnosis not present

## 2013-01-06 DIAGNOSIS — G2581 Restless legs syndrome: Secondary | ICD-10-CM | POA: Diagnosis not present

## 2013-01-12 ENCOUNTER — Ambulatory Visit: Payer: Self-pay | Admitting: Pain Medicine

## 2013-01-12 DIAGNOSIS — IMO0002 Reserved for concepts with insufficient information to code with codable children: Secondary | ICD-10-CM | POA: Diagnosis not present

## 2013-01-20 ENCOUNTER — Ambulatory Visit: Payer: Self-pay | Admitting: Family Medicine

## 2013-01-20 DIAGNOSIS — B370 Candidal stomatitis: Secondary | ICD-10-CM | POA: Diagnosis not present

## 2013-01-20 DIAGNOSIS — I10 Essential (primary) hypertension: Secondary | ICD-10-CM | POA: Diagnosis not present

## 2013-01-20 DIAGNOSIS — E7800 Pure hypercholesterolemia, unspecified: Secondary | ICD-10-CM | POA: Diagnosis not present

## 2013-01-27 ENCOUNTER — Ambulatory Visit: Payer: Self-pay | Admitting: Pain Medicine

## 2013-01-27 DIAGNOSIS — M4716 Other spondylosis with myelopathy, lumbar region: Secondary | ICD-10-CM | POA: Diagnosis not present

## 2013-01-27 DIAGNOSIS — I714 Abdominal aortic aneurysm, without rupture: Secondary | ICD-10-CM | POA: Diagnosis not present

## 2013-01-27 DIAGNOSIS — J449 Chronic obstructive pulmonary disease, unspecified: Secondary | ICD-10-CM | POA: Diagnosis not present

## 2013-01-27 DIAGNOSIS — I729 Aneurysm of unspecified site: Secondary | ICD-10-CM | POA: Diagnosis not present

## 2013-01-27 DIAGNOSIS — G8929 Other chronic pain: Secondary | ICD-10-CM | POA: Diagnosis not present

## 2013-01-27 DIAGNOSIS — Z7902 Long term (current) use of antithrombotics/antiplatelets: Secondary | ICD-10-CM | POA: Diagnosis not present

## 2013-01-27 DIAGNOSIS — M545 Low back pain: Secondary | ICD-10-CM | POA: Diagnosis not present

## 2013-01-27 DIAGNOSIS — M5137 Other intervertebral disc degeneration, lumbosacral region: Secondary | ICD-10-CM | POA: Diagnosis not present

## 2013-01-27 DIAGNOSIS — Z79899 Other long term (current) drug therapy: Secondary | ICD-10-CM | POA: Diagnosis not present

## 2013-01-27 DIAGNOSIS — Z8673 Personal history of transient ischemic attack (TIA), and cerebral infarction without residual deficits: Secondary | ICD-10-CM | POA: Diagnosis not present

## 2013-01-27 DIAGNOSIS — IMO0001 Reserved for inherently not codable concepts without codable children: Secondary | ICD-10-CM | POA: Diagnosis not present

## 2013-01-27 DIAGNOSIS — F17200 Nicotine dependence, unspecified, uncomplicated: Secondary | ICD-10-CM | POA: Diagnosis not present

## 2013-01-27 DIAGNOSIS — I10 Essential (primary) hypertension: Secondary | ICD-10-CM | POA: Diagnosis not present

## 2013-02-01 DIAGNOSIS — B370 Candidal stomatitis: Secondary | ICD-10-CM | POA: Diagnosis not present

## 2013-02-01 DIAGNOSIS — B3781 Candidal esophagitis: Secondary | ICD-10-CM | POA: Diagnosis not present

## 2013-02-16 DIAGNOSIS — B3781 Candidal esophagitis: Secondary | ICD-10-CM | POA: Diagnosis not present

## 2013-02-16 DIAGNOSIS — F329 Major depressive disorder, single episode, unspecified: Secondary | ICD-10-CM | POA: Diagnosis not present

## 2013-02-16 DIAGNOSIS — B370 Candidal stomatitis: Secondary | ICD-10-CM | POA: Diagnosis not present

## 2013-02-16 DIAGNOSIS — I10 Essential (primary) hypertension: Secondary | ICD-10-CM | POA: Diagnosis not present

## 2013-02-20 ENCOUNTER — Inpatient Hospital Stay: Payer: Self-pay | Admitting: Internal Medicine

## 2013-02-20 DIAGNOSIS — K922 Gastrointestinal hemorrhage, unspecified: Secondary | ICD-10-CM | POA: Diagnosis not present

## 2013-02-20 DIAGNOSIS — K828 Other specified diseases of gallbladder: Secondary | ICD-10-CM | POA: Diagnosis present

## 2013-02-20 DIAGNOSIS — K449 Diaphragmatic hernia without obstruction or gangrene: Secondary | ICD-10-CM | POA: Diagnosis not present

## 2013-02-20 DIAGNOSIS — D649 Anemia, unspecified: Secondary | ICD-10-CM | POA: Diagnosis not present

## 2013-02-20 DIAGNOSIS — K921 Melena: Secondary | ICD-10-CM | POA: Diagnosis not present

## 2013-02-20 DIAGNOSIS — K2980 Duodenitis without bleeding: Secondary | ICD-10-CM | POA: Diagnosis not present

## 2013-02-20 DIAGNOSIS — J4489 Other specified chronic obstructive pulmonary disease: Secondary | ICD-10-CM | POA: Diagnosis not present

## 2013-02-20 DIAGNOSIS — E41 Nutritional marasmus: Secondary | ICD-10-CM | POA: Diagnosis not present

## 2013-02-20 DIAGNOSIS — K259 Gastric ulcer, unspecified as acute or chronic, without hemorrhage or perforation: Secondary | ICD-10-CM | POA: Diagnosis present

## 2013-02-20 DIAGNOSIS — K279 Peptic ulcer, site unspecified, unspecified as acute or chronic, without hemorrhage or perforation: Secondary | ICD-10-CM | POA: Diagnosis not present

## 2013-02-20 DIAGNOSIS — I10 Essential (primary) hypertension: Secondary | ICD-10-CM | POA: Diagnosis present

## 2013-02-20 DIAGNOSIS — Z8711 Personal history of peptic ulcer disease: Secondary | ICD-10-CM | POA: Diagnosis not present

## 2013-02-20 DIAGNOSIS — F17200 Nicotine dependence, unspecified, uncomplicated: Secondary | ICD-10-CM | POA: Diagnosis present

## 2013-02-20 DIAGNOSIS — I739 Peripheral vascular disease, unspecified: Secondary | ICD-10-CM | POA: Diagnosis present

## 2013-02-20 DIAGNOSIS — E785 Hyperlipidemia, unspecified: Secondary | ICD-10-CM | POA: Diagnosis present

## 2013-02-20 DIAGNOSIS — Z681 Body mass index (BMI) 19 or less, adult: Secondary | ICD-10-CM | POA: Diagnosis not present

## 2013-02-20 DIAGNOSIS — K920 Hematemesis: Secondary | ICD-10-CM | POA: Diagnosis not present

## 2013-02-20 DIAGNOSIS — Z8249 Family history of ischemic heart disease and other diseases of the circulatory system: Secondary | ICD-10-CM | POA: Diagnosis not present

## 2013-02-20 DIAGNOSIS — D500 Iron deficiency anemia secondary to blood loss (chronic): Secondary | ICD-10-CM | POA: Diagnosis not present

## 2013-02-20 DIAGNOSIS — K5730 Diverticulosis of large intestine without perforation or abscess without bleeding: Secondary | ICD-10-CM | POA: Diagnosis present

## 2013-02-20 DIAGNOSIS — R109 Unspecified abdominal pain: Secondary | ICD-10-CM | POA: Diagnosis not present

## 2013-02-20 DIAGNOSIS — E871 Hypo-osmolality and hyponatremia: Secondary | ICD-10-CM | POA: Diagnosis present

## 2013-02-20 DIAGNOSIS — R1084 Generalized abdominal pain: Secondary | ICD-10-CM | POA: Diagnosis not present

## 2013-02-20 DIAGNOSIS — K8020 Calculus of gallbladder without cholecystitis without obstruction: Secondary | ICD-10-CM | POA: Diagnosis present

## 2013-02-20 DIAGNOSIS — D62 Acute posthemorrhagic anemia: Secondary | ICD-10-CM | POA: Diagnosis present

## 2013-02-20 DIAGNOSIS — Z86718 Personal history of other venous thrombosis and embolism: Secondary | ICD-10-CM | POA: Diagnosis not present

## 2013-02-20 DIAGNOSIS — Z79899 Other long term (current) drug therapy: Secondary | ICD-10-CM | POA: Diagnosis not present

## 2013-02-20 DIAGNOSIS — Z823 Family history of stroke: Secondary | ICD-10-CM | POA: Diagnosis not present

## 2013-02-20 DIAGNOSIS — I714 Abdominal aortic aneurysm, without rupture: Secondary | ICD-10-CM | POA: Diagnosis present

## 2013-02-20 DIAGNOSIS — R000 Tachycardia, unspecified: Secondary | ICD-10-CM | POA: Diagnosis not present

## 2013-02-20 DIAGNOSIS — J449 Chronic obstructive pulmonary disease, unspecified: Secondary | ICD-10-CM | POA: Diagnosis not present

## 2013-02-20 DIAGNOSIS — E7800 Pure hypercholesterolemia, unspecified: Secondary | ICD-10-CM | POA: Diagnosis present

## 2013-02-20 DIAGNOSIS — K31819 Angiodysplasia of stomach and duodenum without bleeding: Secondary | ICD-10-CM | POA: Diagnosis present

## 2013-02-20 LAB — URINALYSIS, COMPLETE
Bacteria: NONE SEEN
Bilirubin,UR: NEGATIVE
Blood: NEGATIVE
Ph: 6 (ref 4.5–8.0)
RBC,UR: NONE SEEN /HPF (ref 0–5)
Specific Gravity: 1.029 (ref 1.003–1.030)
Squamous Epithelial: NONE SEEN

## 2013-02-20 LAB — COMPREHENSIVE METABOLIC PANEL
Albumin: 3 g/dL — ABNORMAL LOW (ref 3.4–5.0)
Anion Gap: 8 (ref 7–16)
Bilirubin,Total: 0.2 mg/dL (ref 0.2–1.0)
EGFR (African American): >60
SGPT (ALT): 17 U/L (ref 12–78)
Total Protein: 6.4 g/dL (ref 6.4–8.2)

## 2013-02-20 LAB — CBC WITH DIFFERENTIAL/PLATELET
Basophil #: 0.2 10*3/uL — ABNORMAL HIGH (ref 0.0–0.1)
Eosinophil #: 0 10*3/uL (ref 0.0–0.7)
HGB: 8.3 g/dL — ABNORMAL LOW (ref 12.0–16.0)
Lymphocyte #: 1.1 10*3/uL (ref 1.0–3.6)
MCH: 21.7 pg — ABNORMAL LOW (ref 26.0–34.0)
MCV: 68 fL — ABNORMAL LOW (ref 80–100)
Monocyte %: 3.5 %
Neutrophil #: 12.4 10*3/uL — ABNORMAL HIGH (ref 1.4–6.5)
Neutrophil %: 87.2 %
Platelet: 707 10*3/uL — ABNORMAL HIGH (ref 150–440)
RDW: 19.5 % — ABNORMAL HIGH (ref 11.5–14.5)
WBC: 14.2 10*3/uL — ABNORMAL HIGH (ref 3.6–11.0)

## 2013-02-20 LAB — TROPONIN I: Troponin-I: <0.02 ng/mL

## 2013-02-21 DIAGNOSIS — R109 Unspecified abdominal pain: Secondary | ICD-10-CM | POA: Diagnosis not present

## 2013-02-21 DIAGNOSIS — K921 Melena: Secondary | ICD-10-CM | POA: Diagnosis not present

## 2013-02-21 LAB — COMPREHENSIVE METABOLIC PANEL
BUN: 27 mg/dL — ABNORMAL HIGH (ref 7–18)
Calcium, Total: 7.8 mg/dL — ABNORMAL LOW (ref 8.5–10.1)
Chloride: 105 mmol/L (ref 98–107)
Co2: 26 mmol/L (ref 21–32)
Creatinine: 0.72 mg/dL (ref 0.60–1.30)
EGFR (African American): >60
EGFR (Non-African Amer.): >60
Glucose: 103 mg/dL — ABNORMAL HIGH (ref 65–99)
SGPT (ALT): 15 U/L (ref 12–78)
Sodium: 137 mmol/L (ref 136–145)
Total Protein: 5.6 g/dL — ABNORMAL LOW (ref 6.4–8.2)

## 2013-02-21 LAB — CBC WITH DIFFERENTIAL/PLATELET
Basophil #: 0.1 10*3/uL (ref 0.0–0.1)
Basophil %: 0.7 %
Eosinophil #: 0 10*3/uL (ref 0.0–0.7)
Eosinophil %: 0 %
HGB: 10.7 g/dL — ABNORMAL LOW (ref 12.0–16.0)
Lymphocyte #: 2.4 10*3/uL (ref 1.0–3.6)
MCH: 25.7 pg — ABNORMAL LOW (ref 26.0–34.0)
MCHC: 32.9 g/dL (ref 32.0–36.0)
MCV: 78 fL — ABNORMAL LOW (ref 80–100)
Neutrophil #: 9.7 10*3/uL — ABNORMAL HIGH (ref 1.4–6.5)
Platelet: 484 10*3/uL — ABNORMAL HIGH (ref 150–440)
RDW: 24.2 % — ABNORMAL HIGH (ref 11.5–14.5)

## 2013-02-22 LAB — HEMOGLOBIN: HGB: 10.6 g/dL — ABNORMAL LOW (ref 12.0–16.0)

## 2013-02-23 LAB — CBC WITH DIFFERENTIAL/PLATELET
Basophil #: 0.1 10*3/uL (ref 0.0–0.1)
Basophil %: 0.6 %
HCT: 27.1 % — ABNORMAL LOW (ref 35.0–47.0)
Lymphocyte %: 18.9 %
MCH: 28.4 pg (ref 26.0–34.0)
MCHC: 35.5 g/dL (ref 32.0–36.0)
Monocyte #: 0.7 x10 3/mm (ref 0.2–0.9)
Monocyte %: 7.1 %
Neutrophil %: 72.1 %
Platelet: 295 10*3/uL (ref 150–440)
RBC: 3.39 10*6/uL — ABNORMAL LOW (ref 3.80–5.20)
RDW: 20.9 % — ABNORMAL HIGH (ref 11.5–14.5)
WBC: 9.7 10*3/uL (ref 3.6–11.0)

## 2013-02-23 LAB — BASIC METABOLIC PANEL
Anion Gap: 7 (ref 7–16)
Chloride: 105 mmol/L (ref 98–107)
Co2: 25 mmol/L (ref 21–32)
Creatinine: 0.66 mg/dL (ref 0.60–1.30)
EGFR (African American): >60
EGFR (Non-African Amer.): >60
Osmolality: 271 (ref 275–301)
Potassium: 3.4 mmol/L — ABNORMAL LOW (ref 3.5–5.1)
Sodium: 137 mmol/L (ref 136–145)

## 2013-02-24 LAB — BASIC METABOLIC PANEL
Anion Gap: 6 — ABNORMAL LOW (ref 7–16)
Chloride: 104 mmol/L (ref 98–107)
Co2: 28 mmol/L (ref 21–32)
Creatinine: 0.66 mg/dL (ref 0.60–1.30)
EGFR (Non-African Amer.): >60
Glucose: 96 mg/dL (ref 65–99)
Osmolality: 273 (ref 275–301)
Sodium: 138 mmol/L (ref 136–145)

## 2013-02-24 LAB — CBC WITH DIFFERENTIAL/PLATELET
Basophil #: 0 10*3/uL (ref 0.0–0.1)
Eosinophil #: 0.1 10*3/uL (ref 0.0–0.7)
HCT: 28.1 % — ABNORMAL LOW (ref 35.0–47.0)
HGB: 9.7 g/dL — ABNORMAL LOW (ref 12.0–16.0)
Lymphocyte #: 1.8 10*3/uL (ref 1.0–3.6)
Lymphocyte %: 24.7 %
MCHC: 34.6 g/dL (ref 32.0–36.0)
MCV: 80 fL (ref 80–100)
Monocyte #: 0.6 x10 3/mm (ref 0.2–0.9)
Neutrophil #: 4.7 10*3/uL (ref 1.4–6.5)
Neutrophil %: 65.3 %
RBC: 3.5 10*6/uL — ABNORMAL LOW (ref 3.80–5.20)
RDW: 20.9 % — ABNORMAL HIGH (ref 11.5–14.5)

## 2013-02-26 ENCOUNTER — Inpatient Hospital Stay: Payer: Self-pay | Admitting: Internal Medicine

## 2013-02-26 DIAGNOSIS — E785 Hyperlipidemia, unspecified: Secondary | ICD-10-CM | POA: Diagnosis not present

## 2013-02-26 DIAGNOSIS — I998 Other disorder of circulatory system: Secondary | ICD-10-CM | POA: Diagnosis present

## 2013-02-26 DIAGNOSIS — K5730 Diverticulosis of large intestine without perforation or abscess without bleeding: Secondary | ICD-10-CM | POA: Diagnosis not present

## 2013-02-26 DIAGNOSIS — Z9089 Acquired absence of other organs: Secondary | ICD-10-CM | POA: Diagnosis not present

## 2013-02-26 DIAGNOSIS — R42 Dizziness and giddiness: Secondary | ICD-10-CM | POA: Diagnosis not present

## 2013-02-26 DIAGNOSIS — Z7902 Long term (current) use of antithrombotics/antiplatelets: Secondary | ICD-10-CM | POA: Diagnosis not present

## 2013-02-26 DIAGNOSIS — J449 Chronic obstructive pulmonary disease, unspecified: Secondary | ICD-10-CM | POA: Diagnosis not present

## 2013-02-26 DIAGNOSIS — K269 Duodenal ulcer, unspecified as acute or chronic, without hemorrhage or perforation: Secondary | ICD-10-CM | POA: Diagnosis present

## 2013-02-26 DIAGNOSIS — K279 Peptic ulcer, site unspecified, unspecified as acute or chronic, without hemorrhage or perforation: Secondary | ICD-10-CM | POA: Diagnosis not present

## 2013-02-26 DIAGNOSIS — Z79899 Other long term (current) drug therapy: Secondary | ICD-10-CM | POA: Diagnosis not present

## 2013-02-26 DIAGNOSIS — R1013 Epigastric pain: Secondary | ICD-10-CM | POA: Diagnosis not present

## 2013-02-26 DIAGNOSIS — K922 Gastrointestinal hemorrhage, unspecified: Secondary | ICD-10-CM | POA: Diagnosis not present

## 2013-02-26 DIAGNOSIS — Z90710 Acquired absence of both cervix and uterus: Secondary | ICD-10-CM | POA: Diagnosis not present

## 2013-02-26 DIAGNOSIS — K5731 Diverticulosis of large intestine without perforation or abscess with bleeding: Secondary | ICD-10-CM | POA: Diagnosis present

## 2013-02-26 DIAGNOSIS — I82409 Acute embolism and thrombosis of unspecified deep veins of unspecified lower extremity: Secondary | ICD-10-CM | POA: Diagnosis present

## 2013-02-26 DIAGNOSIS — Z86718 Personal history of other venous thrombosis and embolism: Secondary | ICD-10-CM | POA: Diagnosis not present

## 2013-02-26 DIAGNOSIS — H938X9 Other specified disorders of ear, unspecified ear: Secondary | ICD-10-CM | POA: Diagnosis not present

## 2013-02-26 DIAGNOSIS — F17200 Nicotine dependence, unspecified, uncomplicated: Secondary | ICD-10-CM | POA: Diagnosis present

## 2013-02-26 DIAGNOSIS — K219 Gastro-esophageal reflux disease without esophagitis: Secondary | ICD-10-CM | POA: Diagnosis present

## 2013-02-26 DIAGNOSIS — D638 Anemia in other chronic diseases classified elsewhere: Secondary | ICD-10-CM | POA: Diagnosis not present

## 2013-02-26 DIAGNOSIS — K259 Gastric ulcer, unspecified as acute or chronic, without hemorrhage or perforation: Secondary | ICD-10-CM | POA: Diagnosis present

## 2013-02-26 DIAGNOSIS — I10 Essential (primary) hypertension: Secondary | ICD-10-CM | POA: Diagnosis not present

## 2013-02-26 DIAGNOSIS — D649 Anemia, unspecified: Secondary | ICD-10-CM | POA: Diagnosis not present

## 2013-02-26 DIAGNOSIS — Z9889 Other specified postprocedural states: Secondary | ICD-10-CM | POA: Diagnosis not present

## 2013-02-26 LAB — BASIC METABOLIC PANEL
Anion Gap: 4 — ABNORMAL LOW (ref 7–16)
BUN: 7 mg/dL (ref 7–18)
Calcium, Total: 8.3 mg/dL — ABNORMAL LOW (ref 8.5–10.1)
Co2: 28 mmol/L (ref 21–32)
Creatinine: 0.75 mg/dL (ref 0.60–1.30)
EGFR (African American): >60
EGFR (Non-African Amer.): >60
Glucose: 113 mg/dL — ABNORMAL HIGH (ref 65–99)
Potassium: 3.8 mmol/L (ref 3.5–5.1)

## 2013-02-26 LAB — CBC
HCT: 35 % (ref 35.0–47.0)
MCH: 28.1 pg (ref 26.0–34.0)
MCHC: 34.6 g/dL (ref 32.0–36.0)
RBC: 4.31 10*6/uL (ref 3.80–5.20)

## 2013-02-27 LAB — BASIC METABOLIC PANEL
Anion Gap: 6 — ABNORMAL LOW (ref 7–16)
BUN: 4 mg/dL — ABNORMAL LOW (ref 7–18)
Calcium, Total: 8 mg/dL — ABNORMAL LOW (ref 8.5–10.1)
Chloride: 105 mmol/L (ref 98–107)
Co2: 26 mmol/L (ref 21–32)
EGFR (Non-African Amer.): >60
Glucose: 91 mg/dL (ref 65–99)
Potassium: 3.6 mmol/L (ref 3.5–5.1)

## 2013-02-27 LAB — CBC WITH DIFFERENTIAL/PLATELET
Basophil #: 0.1 10*3/uL (ref 0.0–0.1)
Eosinophil #: 0.1 10*3/uL (ref 0.0–0.7)
Eosinophil %: 1.6 %
HCT: 29.9 % — ABNORMAL LOW (ref 35.0–47.0)
Lymphocyte #: 2 10*3/uL (ref 1.0–3.6)
Lymphocyte %: 28.1 %
MCHC: 34.2 g/dL (ref 32.0–36.0)
Monocyte #: 0.8 x10 3/mm (ref 0.2–0.9)
Neutrophil %: 58.6 %
Platelet: 374 10*3/uL (ref 150–440)
RBC: 3.69 10*6/uL — ABNORMAL LOW (ref 3.80–5.20)
WBC: 7.2 10*3/uL (ref 3.6–11.0)

## 2013-02-28 LAB — CBC WITH DIFFERENTIAL/PLATELET
Basophil #: 0.1 10*3/uL (ref 0.0–0.1)
Eosinophil %: 1.3 %
HCT: 30.3 % — ABNORMAL LOW (ref 35.0–47.0)
HGB: 10.3 g/dL — ABNORMAL LOW (ref 12.0–16.0)
Lymphocyte #: 1.3 10*3/uL (ref 1.0–3.6)
MCH: 27.8 pg (ref 26.0–34.0)
MCV: 82 fL (ref 80–100)
Monocyte #: 0.9 x10 3/mm (ref 0.2–0.9)
Monocyte %: 9.9 %
Neutrophil #: 6.4 10*3/uL (ref 1.4–6.5)
Neutrophil %: 73.1 %
Platelet: 402 10*3/uL (ref 150–440)
RBC: 3.7 10*6/uL — ABNORMAL LOW (ref 3.80–5.20)
RDW: 21.1 % — ABNORMAL HIGH (ref 11.5–14.5)
WBC: 8.8 10*3/uL (ref 3.6–11.0)

## 2013-03-06 DIAGNOSIS — I739 Peripheral vascular disease, unspecified: Secondary | ICD-10-CM | POA: Diagnosis not present

## 2013-03-06 DIAGNOSIS — E789 Disorder of lipoprotein metabolism, unspecified: Secondary | ICD-10-CM | POA: Diagnosis not present

## 2013-03-06 DIAGNOSIS — I10 Essential (primary) hypertension: Secondary | ICD-10-CM | POA: Diagnosis not present

## 2013-03-06 DIAGNOSIS — J449 Chronic obstructive pulmonary disease, unspecified: Secondary | ICD-10-CM | POA: Diagnosis not present

## 2013-03-06 DIAGNOSIS — K922 Gastrointestinal hemorrhage, unspecified: Secondary | ICD-10-CM | POA: Diagnosis not present

## 2013-03-09 DIAGNOSIS — E789 Disorder of lipoprotein metabolism, unspecified: Secondary | ICD-10-CM | POA: Diagnosis not present

## 2013-03-09 DIAGNOSIS — I739 Peripheral vascular disease, unspecified: Secondary | ICD-10-CM | POA: Diagnosis not present

## 2013-03-09 DIAGNOSIS — K922 Gastrointestinal hemorrhage, unspecified: Secondary | ICD-10-CM | POA: Diagnosis not present

## 2013-03-09 DIAGNOSIS — I10 Essential (primary) hypertension: Secondary | ICD-10-CM | POA: Diagnosis not present

## 2013-03-09 DIAGNOSIS — J449 Chronic obstructive pulmonary disease, unspecified: Secondary | ICD-10-CM | POA: Diagnosis not present

## 2013-03-17 DIAGNOSIS — I32 Pericarditis in diseases classified elsewhere: Secondary | ICD-10-CM | POA: Diagnosis not present

## 2013-03-17 DIAGNOSIS — K274 Chronic or unspecified peptic ulcer, site unspecified, with hemorrhage: Secondary | ICD-10-CM | POA: Diagnosis not present

## 2013-03-17 DIAGNOSIS — M109 Gout, unspecified: Secondary | ICD-10-CM | POA: Diagnosis not present

## 2013-03-17 DIAGNOSIS — D649 Anemia, unspecified: Secondary | ICD-10-CM | POA: Diagnosis not present

## 2013-03-30 DIAGNOSIS — K259 Gastric ulcer, unspecified as acute or chronic, without hemorrhage or perforation: Secondary | ICD-10-CM | POA: Diagnosis not present

## 2013-04-14 HISTORY — PX: ESOPHAGOGASTRODUODENOSCOPY: SHX1529

## 2013-04-16 ENCOUNTER — Ambulatory Visit: Payer: Self-pay | Admitting: Pain Medicine

## 2013-04-16 DIAGNOSIS — M4716 Other spondylosis with myelopathy, lumbar region: Secondary | ICD-10-CM | POA: Diagnosis not present

## 2013-04-16 DIAGNOSIS — IMO0001 Reserved for inherently not codable concepts without codable children: Secondary | ICD-10-CM | POA: Diagnosis not present

## 2013-04-16 DIAGNOSIS — M545 Low back pain: Secondary | ICD-10-CM | POA: Diagnosis not present

## 2013-04-16 DIAGNOSIS — I714 Abdominal aortic aneurysm, without rupture: Secondary | ICD-10-CM | POA: Diagnosis not present

## 2013-04-16 DIAGNOSIS — I729 Aneurysm of unspecified site: Secondary | ICD-10-CM | POA: Diagnosis not present

## 2013-04-16 DIAGNOSIS — M5137 Other intervertebral disc degeneration, lumbosacral region: Secondary | ICD-10-CM | POA: Diagnosis not present

## 2013-04-16 DIAGNOSIS — Z86718 Personal history of other venous thrombosis and embolism: Secondary | ICD-10-CM | POA: Diagnosis not present

## 2013-04-16 DIAGNOSIS — Z9889 Other specified postprocedural states: Secondary | ICD-10-CM | POA: Diagnosis not present

## 2013-04-16 DIAGNOSIS — F17200 Nicotine dependence, unspecified, uncomplicated: Secondary | ICD-10-CM | POA: Diagnosis not present

## 2013-04-16 DIAGNOSIS — Z7902 Long term (current) use of antithrombotics/antiplatelets: Secondary | ICD-10-CM | POA: Diagnosis not present

## 2013-04-16 DIAGNOSIS — J449 Chronic obstructive pulmonary disease, unspecified: Secondary | ICD-10-CM | POA: Diagnosis not present

## 2013-04-16 DIAGNOSIS — I10 Essential (primary) hypertension: Secondary | ICD-10-CM | POA: Diagnosis not present

## 2013-04-16 DIAGNOSIS — Z5181 Encounter for therapeutic drug level monitoring: Secondary | ICD-10-CM | POA: Diagnosis not present

## 2013-04-16 DIAGNOSIS — Z79899 Other long term (current) drug therapy: Secondary | ICD-10-CM | POA: Diagnosis not present

## 2013-04-21 DIAGNOSIS — F17200 Nicotine dependence, unspecified, uncomplicated: Secondary | ICD-10-CM | POA: Diagnosis not present

## 2013-04-21 DIAGNOSIS — M79609 Pain in unspecified limb: Secondary | ICD-10-CM | POA: Diagnosis not present

## 2013-04-21 DIAGNOSIS — I739 Peripheral vascular disease, unspecified: Secondary | ICD-10-CM | POA: Diagnosis not present

## 2013-04-21 DIAGNOSIS — I70219 Atherosclerosis of native arteries of extremities with intermittent claudication, unspecified extremity: Secondary | ICD-10-CM | POA: Diagnosis not present

## 2013-05-13 ENCOUNTER — Ambulatory Visit: Payer: Self-pay | Admitting: Unknown Physician Specialty

## 2013-05-13 DIAGNOSIS — I739 Peripheral vascular disease, unspecified: Secondary | ICD-10-CM | POA: Diagnosis not present

## 2013-05-13 DIAGNOSIS — M545 Low back pain, unspecified: Secondary | ICD-10-CM | POA: Diagnosis not present

## 2013-05-13 DIAGNOSIS — F17200 Nicotine dependence, unspecified, uncomplicated: Secondary | ICD-10-CM | POA: Diagnosis not present

## 2013-05-13 DIAGNOSIS — K922 Gastrointestinal hemorrhage, unspecified: Secondary | ICD-10-CM | POA: Diagnosis not present

## 2013-05-13 DIAGNOSIS — Z79899 Other long term (current) drug therapy: Secondary | ICD-10-CM | POA: Diagnosis not present

## 2013-05-13 DIAGNOSIS — K31819 Angiodysplasia of stomach and duodenum without bleeding: Secondary | ICD-10-CM | POA: Diagnosis not present

## 2013-05-13 DIAGNOSIS — IMO0001 Reserved for inherently not codable concepts without codable children: Secondary | ICD-10-CM | POA: Diagnosis not present

## 2013-05-13 DIAGNOSIS — I10 Essential (primary) hypertension: Secondary | ICD-10-CM | POA: Diagnosis not present

## 2013-05-13 DIAGNOSIS — M810 Age-related osteoporosis without current pathological fracture: Secondary | ICD-10-CM | POA: Diagnosis not present

## 2013-05-13 DIAGNOSIS — F341 Dysthymic disorder: Secondary | ICD-10-CM | POA: Diagnosis not present

## 2013-05-13 DIAGNOSIS — Z885 Allergy status to narcotic agent status: Secondary | ICD-10-CM | POA: Diagnosis not present

## 2013-05-13 DIAGNOSIS — K449 Diaphragmatic hernia without obstruction or gangrene: Secondary | ICD-10-CM | POA: Diagnosis not present

## 2013-08-19 ENCOUNTER — Ambulatory Visit: Payer: Self-pay

## 2013-08-19 DIAGNOSIS — Z79899 Other long term (current) drug therapy: Secondary | ICD-10-CM | POA: Diagnosis not present

## 2013-08-19 DIAGNOSIS — N3090 Cystitis, unspecified without hematuria: Secondary | ICD-10-CM | POA: Diagnosis not present

## 2013-08-19 DIAGNOSIS — I10 Essential (primary) hypertension: Secondary | ICD-10-CM | POA: Diagnosis not present

## 2013-08-19 DIAGNOSIS — F17200 Nicotine dependence, unspecified, uncomplicated: Secondary | ICD-10-CM | POA: Diagnosis not present

## 2013-08-19 DIAGNOSIS — E7800 Pure hypercholesterolemia, unspecified: Secondary | ICD-10-CM | POA: Diagnosis not present

## 2013-08-19 DIAGNOSIS — N390 Urinary tract infection, site not specified: Secondary | ICD-10-CM | POA: Diagnosis not present

## 2013-08-19 LAB — URINALYSIS, COMPLETE: BACTERIA: NEGATIVE

## 2013-08-20 ENCOUNTER — Ambulatory Visit: Payer: Self-pay | Admitting: Pain Medicine

## 2013-08-20 DIAGNOSIS — F17200 Nicotine dependence, unspecified, uncomplicated: Secondary | ICD-10-CM | POA: Diagnosis not present

## 2013-08-20 DIAGNOSIS — M545 Low back pain, unspecified: Secondary | ICD-10-CM | POA: Diagnosis not present

## 2013-08-20 DIAGNOSIS — Z79899 Other long term (current) drug therapy: Secondary | ICD-10-CM | POA: Diagnosis not present

## 2013-08-20 DIAGNOSIS — I714 Abdominal aortic aneurysm, without rupture, unspecified: Secondary | ICD-10-CM | POA: Diagnosis not present

## 2013-08-20 DIAGNOSIS — Z86718 Personal history of other venous thrombosis and embolism: Secondary | ICD-10-CM | POA: Diagnosis not present

## 2013-08-20 DIAGNOSIS — Z7902 Long term (current) use of antithrombotics/antiplatelets: Secondary | ICD-10-CM | POA: Diagnosis not present

## 2013-08-20 DIAGNOSIS — M51379 Other intervertebral disc degeneration, lumbosacral region without mention of lumbar back pain or lower extremity pain: Secondary | ICD-10-CM | POA: Diagnosis not present

## 2013-08-20 DIAGNOSIS — J449 Chronic obstructive pulmonary disease, unspecified: Secondary | ICD-10-CM | POA: Diagnosis not present

## 2013-08-20 DIAGNOSIS — G894 Chronic pain syndrome: Secondary | ICD-10-CM | POA: Diagnosis not present

## 2013-08-20 DIAGNOSIS — IMO0001 Reserved for inherently not codable concepts without codable children: Secondary | ICD-10-CM | POA: Diagnosis not present

## 2013-08-20 DIAGNOSIS — I10 Essential (primary) hypertension: Secondary | ICD-10-CM | POA: Diagnosis not present

## 2013-08-20 DIAGNOSIS — M5137 Other intervertebral disc degeneration, lumbosacral region: Secondary | ICD-10-CM | POA: Diagnosis not present

## 2013-08-20 DIAGNOSIS — Z9889 Other specified postprocedural states: Secondary | ICD-10-CM | POA: Diagnosis not present

## 2013-08-21 LAB — URINE CULTURE

## 2013-09-21 ENCOUNTER — Ambulatory Visit: Payer: Self-pay | Admitting: Pain Medicine

## 2013-09-21 DIAGNOSIS — IMO0002 Reserved for concepts with insufficient information to code with codable children: Secondary | ICD-10-CM | POA: Diagnosis not present

## 2013-10-11 ENCOUNTER — Ambulatory Visit: Payer: Self-pay | Admitting: Pain Medicine

## 2013-10-11 DIAGNOSIS — I714 Abdominal aortic aneurysm, without rupture, unspecified: Secondary | ICD-10-CM | POA: Diagnosis not present

## 2013-10-11 DIAGNOSIS — G8929 Other chronic pain: Secondary | ICD-10-CM | POA: Diagnosis not present

## 2013-10-11 DIAGNOSIS — Z79899 Other long term (current) drug therapy: Secondary | ICD-10-CM | POA: Diagnosis not present

## 2013-10-11 DIAGNOSIS — M4716 Other spondylosis with myelopathy, lumbar region: Secondary | ICD-10-CM | POA: Diagnosis not present

## 2013-10-11 DIAGNOSIS — J449 Chronic obstructive pulmonary disease, unspecified: Secondary | ICD-10-CM | POA: Diagnosis not present

## 2013-10-11 DIAGNOSIS — M5137 Other intervertebral disc degeneration, lumbosacral region: Secondary | ICD-10-CM | POA: Diagnosis not present

## 2013-10-11 DIAGNOSIS — F17200 Nicotine dependence, unspecified, uncomplicated: Secondary | ICD-10-CM | POA: Diagnosis not present

## 2013-10-11 DIAGNOSIS — I10 Essential (primary) hypertension: Secondary | ICD-10-CM | POA: Diagnosis not present

## 2013-10-11 DIAGNOSIS — Z86718 Personal history of other venous thrombosis and embolism: Secondary | ICD-10-CM | POA: Diagnosis not present

## 2013-10-11 DIAGNOSIS — K279 Peptic ulcer, site unspecified, unspecified as acute or chronic, without hemorrhage or perforation: Secondary | ICD-10-CM | POA: Diagnosis not present

## 2013-10-11 DIAGNOSIS — M545 Low back pain, unspecified: Secondary | ICD-10-CM | POA: Diagnosis not present

## 2013-10-11 DIAGNOSIS — IMO0001 Reserved for inherently not codable concepts without codable children: Secondary | ICD-10-CM | POA: Diagnosis not present

## 2013-10-21 DIAGNOSIS — I714 Abdominal aortic aneurysm, without rupture, unspecified: Secondary | ICD-10-CM | POA: Diagnosis not present

## 2013-10-21 DIAGNOSIS — F17200 Nicotine dependence, unspecified, uncomplicated: Secondary | ICD-10-CM | POA: Diagnosis not present

## 2013-10-21 DIAGNOSIS — I70219 Atherosclerosis of native arteries of extremities with intermittent claudication, unspecified extremity: Secondary | ICD-10-CM | POA: Diagnosis not present

## 2013-10-21 DIAGNOSIS — I739 Peripheral vascular disease, unspecified: Secondary | ICD-10-CM | POA: Diagnosis not present

## 2013-11-22 ENCOUNTER — Ambulatory Visit: Payer: Self-pay | Admitting: Family Medicine

## 2013-11-22 DIAGNOSIS — S9030XA Contusion of unspecified foot, initial encounter: Secondary | ICD-10-CM | POA: Diagnosis not present

## 2013-11-22 DIAGNOSIS — G8929 Other chronic pain: Secondary | ICD-10-CM | POA: Diagnosis not present

## 2013-11-22 DIAGNOSIS — S8990XA Unspecified injury of unspecified lower leg, initial encounter: Secondary | ICD-10-CM | POA: Diagnosis not present

## 2013-11-22 DIAGNOSIS — N3090 Cystitis, unspecified without hematuria: Secondary | ICD-10-CM | POA: Diagnosis not present

## 2013-11-22 DIAGNOSIS — S99919A Unspecified injury of unspecified ankle, initial encounter: Secondary | ICD-10-CM | POA: Diagnosis not present

## 2013-11-22 DIAGNOSIS — M549 Dorsalgia, unspecified: Secondary | ICD-10-CM | POA: Diagnosis not present

## 2013-11-22 DIAGNOSIS — S93409A Sprain of unspecified ligament of unspecified ankle, initial encounter: Secondary | ICD-10-CM | POA: Diagnosis not present

## 2013-11-22 LAB — URINALYSIS, COMPLETE

## 2013-11-24 LAB — URINE CULTURE

## 2013-12-17 ENCOUNTER — Ambulatory Visit: Payer: Self-pay | Admitting: Pain Medicine

## 2013-12-17 DIAGNOSIS — M545 Low back pain, unspecified: Secondary | ICD-10-CM | POA: Diagnosis not present

## 2013-12-17 DIAGNOSIS — M4716 Other spondylosis with myelopathy, lumbar region: Secondary | ICD-10-CM | POA: Diagnosis not present

## 2013-12-17 DIAGNOSIS — I10 Essential (primary) hypertension: Secondary | ICD-10-CM | POA: Diagnosis not present

## 2013-12-17 DIAGNOSIS — J449 Chronic obstructive pulmonary disease, unspecified: Secondary | ICD-10-CM | POA: Diagnosis not present

## 2013-12-17 DIAGNOSIS — F17200 Nicotine dependence, unspecified, uncomplicated: Secondary | ICD-10-CM | POA: Diagnosis not present

## 2013-12-17 DIAGNOSIS — Z79899 Other long term (current) drug therapy: Secondary | ICD-10-CM | POA: Diagnosis not present

## 2013-12-17 DIAGNOSIS — Z86718 Personal history of other venous thrombosis and embolism: Secondary | ICD-10-CM | POA: Diagnosis not present

## 2013-12-17 DIAGNOSIS — IMO0001 Reserved for inherently not codable concepts without codable children: Secondary | ICD-10-CM | POA: Diagnosis not present

## 2013-12-17 DIAGNOSIS — Z7902 Long term (current) use of antithrombotics/antiplatelets: Secondary | ICD-10-CM | POA: Diagnosis not present

## 2013-12-17 DIAGNOSIS — M5137 Other intervertebral disc degeneration, lumbosacral region: Secondary | ICD-10-CM | POA: Diagnosis not present

## 2013-12-17 DIAGNOSIS — I714 Abdominal aortic aneurysm, without rupture, unspecified: Secondary | ICD-10-CM | POA: Diagnosis not present

## 2013-12-17 DIAGNOSIS — K279 Peptic ulcer, site unspecified, unspecified as acute or chronic, without hemorrhage or perforation: Secondary | ICD-10-CM | POA: Diagnosis not present

## 2013-12-17 DIAGNOSIS — G8929 Other chronic pain: Secondary | ICD-10-CM | POA: Diagnosis not present

## 2013-12-17 DIAGNOSIS — G894 Chronic pain syndrome: Secondary | ICD-10-CM | POA: Diagnosis not present

## 2014-01-18 DIAGNOSIS — K277 Chronic peptic ulcer, site unspecified, without hemorrhage or perforation: Secondary | ICD-10-CM | POA: Diagnosis not present

## 2014-01-18 DIAGNOSIS — G2581 Restless legs syndrome: Secondary | ICD-10-CM | POA: Diagnosis not present

## 2014-01-18 DIAGNOSIS — Z0000 Encounter for general adult medical examination without abnormal findings: Secondary | ICD-10-CM | POA: Diagnosis not present

## 2014-01-18 DIAGNOSIS — Z23 Encounter for immunization: Secondary | ICD-10-CM | POA: Diagnosis not present

## 2014-01-18 DIAGNOSIS — E785 Hyperlipidemia, unspecified: Secondary | ICD-10-CM | POA: Diagnosis not present

## 2014-01-18 DIAGNOSIS — D369 Benign neoplasm, unspecified site: Secondary | ICD-10-CM | POA: Diagnosis not present

## 2014-04-01 ENCOUNTER — Ambulatory Visit: Payer: Self-pay | Admitting: Pain Medicine

## 2014-04-01 DIAGNOSIS — M545 Low back pain, unspecified: Secondary | ICD-10-CM | POA: Diagnosis not present

## 2014-04-01 DIAGNOSIS — G894 Chronic pain syndrome: Secondary | ICD-10-CM | POA: Diagnosis not present

## 2014-04-01 DIAGNOSIS — M5137 Other intervertebral disc degeneration, lumbosacral region: Secondary | ICD-10-CM | POA: Diagnosis not present

## 2014-04-01 DIAGNOSIS — M546 Pain in thoracic spine: Secondary | ICD-10-CM | POA: Diagnosis not present

## 2014-04-01 DIAGNOSIS — Z79899 Other long term (current) drug therapy: Secondary | ICD-10-CM | POA: Diagnosis not present

## 2014-04-01 DIAGNOSIS — M79609 Pain in unspecified limb: Secondary | ICD-10-CM | POA: Diagnosis not present

## 2014-04-01 DIAGNOSIS — IMO0002 Reserved for concepts with insufficient information to code with codable children: Secondary | ICD-10-CM | POA: Diagnosis not present

## 2014-04-01 DIAGNOSIS — IMO0001 Reserved for inherently not codable concepts without codable children: Secondary | ICD-10-CM | POA: Diagnosis not present

## 2014-04-20 DIAGNOSIS — K274 Chronic or unspecified peptic ulcer, site unspecified, with hemorrhage: Secondary | ICD-10-CM | POA: Diagnosis not present

## 2014-04-20 DIAGNOSIS — D649 Anemia, unspecified: Secondary | ICD-10-CM | POA: Diagnosis not present

## 2014-04-20 DIAGNOSIS — I10 Essential (primary) hypertension: Secondary | ICD-10-CM | POA: Diagnosis not present

## 2014-04-20 DIAGNOSIS — F3341 Major depressive disorder, recurrent, in partial remission: Secondary | ICD-10-CM | POA: Diagnosis not present

## 2014-04-20 DIAGNOSIS — I739 Peripheral vascular disease, unspecified: Secondary | ICD-10-CM | POA: Diagnosis not present

## 2014-04-20 DIAGNOSIS — G44229 Chronic tension-type headache, not intractable: Secondary | ICD-10-CM | POA: Diagnosis not present

## 2014-04-20 DIAGNOSIS — R300 Dysuria: Secondary | ICD-10-CM | POA: Diagnosis not present

## 2014-04-25 DIAGNOSIS — I739 Peripheral vascular disease, unspecified: Secondary | ICD-10-CM | POA: Diagnosis not present

## 2014-05-03 DIAGNOSIS — I739 Peripheral vascular disease, unspecified: Secondary | ICD-10-CM | POA: Diagnosis not present

## 2014-08-16 DIAGNOSIS — G8929 Other chronic pain: Secondary | ICD-10-CM | POA: Diagnosis not present

## 2014-08-16 DIAGNOSIS — M545 Low back pain: Secondary | ICD-10-CM | POA: Diagnosis not present

## 2014-11-01 DIAGNOSIS — F17200 Nicotine dependence, unspecified, uncomplicated: Secondary | ICD-10-CM | POA: Diagnosis not present

## 2014-11-01 DIAGNOSIS — I714 Abdominal aortic aneurysm, without rupture: Secondary | ICD-10-CM | POA: Diagnosis not present

## 2014-11-01 DIAGNOSIS — I70212 Atherosclerosis of native arteries of extremities with intermittent claudication, left leg: Secondary | ICD-10-CM | POA: Diagnosis not present

## 2014-11-04 NOTE — Consult Note (Signed)
PATIENT NAME:  Tasha Reyes, Tasha Reyes MR#:  [id number] DATE OF BIRTH:  04-08-1931  DATE OF CONSULTATION:  02/27/2013  CONSULTING PHYSICIAN:  Lucilla Lame, MD CONSULTING SERVICE: Gastroenterology  REASON FOR CONSULTATION: Hematochezia, lower GI bleed.   HISTORY OF PRESENT ILLNESS: This patient is an 81 year old woman who was recently discharged from the hospital for GI bleed. At that time, the patient had been seen by Dr. Vira Agar and had an upper endoscopy on 2 consecutive days that showed the patient to have gastric ulcers and duodenitis without any active bleeding, and conservative treatment with a PPI and antacids was recommended. The patient reports that she had a colonoscopy approximately 2 years ago and was told that she had diverticulosis. This also was reported to be done by Dr. Vira Agar. The patient has a history of abdominal aortic aneurysm repair and had 2 attempts at a colonoscopy by Dr. Gustavo Lah that were incomplete. Then she followed up with Dr. Vira Agar, who was able to complete the colonoscopy, and she states that she was told that she had diverticulosis at that time. The patient was now admitted with bright red blood per rectum and some dark stools but states that she was put on iron. She was not hypotensive, nor did she have a very large drop in her hemoglobin except for some dilutional changes. On admission her hemoglobin was 12.2, and later that day it was 10.9 and has remained at 10.2. The patient denies any abdominal pain, nausea, vomiting, fevers or chills. She also has not had any further GI bleeding.   PAST MEDICAL HISTORY: Hypertension, hyperlipidemia, duodenal ulcers, duodenal angiectasia, right lower extremity deep vein thrombosis, history of abdominal aortic aneurysm status post repair, appendectomy, hysterectomy, hemorrhoidectomy.   ALLERGIES: No known drug allergies.  HOME MEDICATIONS: Lipitor, atenolol, iron, Protonix. She was on Plavix but stopped it 2 days ago at the time of  discharge because of her GI bleed.   SOCIAL HISTORY: The patient denies alcohol or drug abuse but smokes 1/2 pack of cigarettes per day.   FAMILY HISTORY: Noncontributory.   REVIEW OF SYSTEMS: Ten-point review of systems reviewed and was positive for only what was mentioned in the HPI.   PHYSICAL EXAMINATION: VITAL SIGNS: Temperature 98.4, pulse 67, respirations 19, blood pressure 126/71, pulse oximetry 93%.  HEENT: Normocephalic, atraumatic. Extraocular motor intact. Pupils equally round and reactive to light and accommodation. NECK: Without JVD, without lymphadenopathy.  LUNGS: Clear to auscultation bilaterally.  HEART: Regular rate and rhythm without murmurs, rubs or gallops.  ABDOMEN: Soft, nontender, nondistended without hepatosplenomegaly.  EXTREMITIES: Without cyanosis, clubbing or edema.  SKIN: Without any rashes or lesion. NEUROLOGICAL: Grossly intact.  LABORATORY DATA: The patient's white cell count is normal. Hemoglobin as stated above. Fecal occult stools positive.   ASSESSMENT AND PLAN: This patient is an 81 year old woman who comes in with what appears to be a lower gastrointestinal bleed. It is unlikely a rapid upper gastrointestinal bleed because the bleeding has stopped and she has not had continued melena, which would have resulted from the cathartic effect of having a massive upper gastrointestinal bleed. She also does not have hypotension and a large drop in hemoglobin, which would be indicative of an upper gastrointestinal bleed as the cause of her bright red blood per rectum. The patient has not had any further bleeding, therefore, will be monitored due to her relatively recent colonoscopy 2 years ago. The patient has been explained this and although she may need a repeat colonoscopy, that can be considered as  an outpatient. Acutely, I do not believe a colonoscopy is needed unless she continues to bleed.  Thank you very much for involving me in the care of this patient.  If you have any questions, please do not hesitate to call.   ____________________________ Lucilla Lame, MD dw:jm D: 02/27/2013 18:20:00 ET T: 02/27/2013 22:17:25 ET JOB#: [id number]  cc: Lucilla Lame, MD, <Dictator> Lucilla Lame MD ELECTRONICALLY SIGNED 03/01/2013 10:40

## 2014-11-04 NOTE — Discharge Summary (Signed)
PATIENT NAME:  Tasha Reyes, Tasha Reyes MR#:  [id number] DATE OF BIRTH:  [birth year]  DATE OF ADMISSION:  02/26/2013 DATE OF DISCHARGE:  03/01/2013  ADMITTING PHYSICIAN: Gladstone Lighter, MD  DISCHARGING PHYSICIAN:  Gladstone Lighter, MD  PRIMARY CARE PHYSICIAN: Dr. Halina Maidens.  PRIMARY GASTROENTEROLOGIST: Dr. Vira Agar.   CONSULTATIONS IN THE HOSPITAL: GI consultation by Dr. Lucilla Lame.     DISCHARGE DIAGNOSES: 1.  Lower gastrointestinal bleed from diverticulosis.  2.  Recent admission for upper gastrointestinal bleed last week from gastritis and ulcerative disease.  3.  Status post blood transfusions during past admission.  4.  Anemia of chronic disease from gastrointestinal bleed.  5.  Hypertension.  6.  Hyperlipidemia.  7.  Ongoing smoking.  8.  Dizziness related to inner ear problems.   DISCHARGE HOME MEDICATIONS:  1.  Lipitor 10 mg p.o. daily.  2.  Atenolol 25 mg p.o. daily.  3.  Protonix 40 mg p.o. b.i.d.  4.  Ferrous sulfate 325 mg p.o. 3 times a day.  5.  Tylenol 650 mg p.o. q.6 hours p.r.n. for pain.   DISCHARGE DIET: Low-sodium diet.   DISCHARGE ACTIVITY: As tolerated.     FOLLOWUP INSTRUCTIONS: 1.  Follow up with Dr. Vira Agar in 2 weeks.  2a PCP followup in 3 weeks.   LABORATORY DATA AND IMAGING STUDIES: Prior to discharge, WBC 8.8, hemoglobin 10.3, hematocrit 30.3, platelet count 402.   Sodium 137, potassium 3.6, chloride 105, bicarb 26, BUN 4, creatinine 0.72, glucose 91.   Abdominal x-ray for epigastric pain showing COPD changes in the lower lung fields. No evidence of bowel obstruction or perforation noted.   BRIEF HOSPITAL COURSE: Ms. Mossbarger is an 81 year old Caucasian female with past medical history significant for chronic anemia on iron supplements, hypertension, ongoing smoking, who was recently in the hospital from 20 February 2013 to 24 February 2013 for upper GI bleed requiring blood transfusions, and EGD showing ulcerative disease and gastritis. She was  discharged home on 02/24/2013, but came back again on 02/26/2013, to the ER secondary to blood in the stool. In the past admission, as mentioned above, the patient has required blood transfusion, but when she comes to the ER this admission, her hemoglobin was stable.    1.  Rectal bleed, likely lower gastrointestinal bleed this time. The patient had recent colonoscopies done twice in 2004, which showed diverticulosis at both times. She was admitted to the hospital, seen by GI, has not had further rectal bleed while in the hospital, and her hemoglobin has remained stable, around 10 without requiring any transfusion this admission. She was advised to follow up with GI as an outpatient. She was on Plavix prior to last hospitalization for previous history of thrombosis and that was discontinued, and the patient was advised to stay away from it for now.  2.  Dizziness. The patient has had dizziness, mostly related to position, so she was monitored for an extra day.  Orthostatic vital signs were taken, did not show any hypotension, and it was more like a benign positional vertigo. The patient has had this for a long period of time and has not required any meclizine while in the hospital.  3. Hypertension, continue atenolol.  4.  Gastroesophageal reflux disease. She is on Protonix b.i.d.  5.  Hyperlipidemia. She is on Lipitor.   Her course has been otherwise uneventful in the hospital.   DISCHARGE CONDITION: Stable.   DISCHARGE DISPOSITION: Home with home health.   TIME SPENT ON DISCHARGE: 40 minutes.  ____________________________ Gladstone Lighter, MD rk:dmm D: 03/08/2013 20:56:00 ET T: 03/08/2013 21:28:14 ET JOB#: [id number]  cc: Gladstone Lighter, MD, <Dictator> Manya Silvas, MD Halina Maidens, MD

## 2014-11-04 NOTE — Discharge Summary (Signed)
PATIENT NAME:  Tasha Reyes, Tasha Reyes MR#:  [id number] DATE OF BIRTH:  1931-02-16  DATE OF ADMISSION:  02/20/2013 DATE OF DISCHARGE:  02/24/2013  DISCHARGE DIAGNOSES: 1.  Upper gastrointestinal bleed. 2.  Peptic ulcer disease. 3.  Anemia due to blood loss.  4.  Tachycardia due to blood loss.  5.  Chronic obstructive pulmonary disease, chronic smoker.   CONDITION ON DISCHARGE: Stable.   CODE STATUS: FULL CODE.   DISCHARGE MEDICATIONS: 1.  Lipitor 10 mg oral tablet once a day.  2.  Atenolol 25 mg once a day.  3.  Pantoprazole 40 mg 2 times a day.  4.  Nicotine patch transdermal once a day.  5.  Ferrous sulfate 325 mg oral tablet 3 times a day   HOME HEALTH ON DISCHARGE:  Yes. Nurse Aide.   DIET ON DISCHARGE: Low sodium diet. Supplement: Ensure 2 times a day. Diet consistency: Regular.    FOLLOWUP:  Advised to follow within 2 to 4 weeks with Dr. Vira Agar to check hemoglobin and follow on gastric ulcer.   HISTORY OF PRESENT ILLNESS:  An 81 year old female with history of hypertension and  hyperlipidemia had AAA repair, right lower extremity DVT and was on Plavix, had problem with orthostasis over 1 to 2 weeks and was taken off atenolol  , but just adjusted the blood pressure medication.  Then next, she had onset of severe abdominal pain associated with black stool and vomiting of blood so she came to the Emergency Room. She was noted having tachycardia and anemia. She was pale, stool was guaiac-positive, so admitted with further management.    HOSPITAL COURSE AND STAY:  1.  For gastrointestinal bleed, she was given 2 units blood transfusion on admission. EGD was done which showed nonbleeding ulcers so she was transferred to floor but then again that night, she had bleeding and her hemoglobin dropped from 10 to 6.9. She received two more units of blood transfusion and continued on Protonix drip.  Repeat EGD was done which confirmed the findings with improvement in ulcers so she was monitored  with gradually starting diet, liquids and then regular. She tolerated it very well. No more episode of bleeding. Hemoglobin remained stable after transfusion of 2 more units on 11th of August and so discharged home with further intervention as outpatient with Dr. Percell [place] office.  2.  Hyperglycemia. The patient was n.p.o., so did not treat it and then later on it became normal. 3.  Hyponatremia. We corrected with normal saline.  4.  Peripheral vascular disease. Was on Plavix. Plavix was held due to peptic ulcer disease and advised to follow in GI clinic and can be started in the future. 5.  Benign hypertension. Blood pressure under control. No medications were needed.  6.  Hyperlipidemia. Initially she was n.p.o. so we stopped the medication, but then we resumed her medications on discharge.  7.  Chronic obstructive pulmonary disease, tobacco abuse, smoker. Smoking cessation counseling was done for 5 minutes, nicotine patch was offered and at the time of discharge prescribed for home use.    CONSULTATION IN THE HOSPITAL: Dr. Vira Agar for GI.    IMPORTANT LAB RESULTS IS IN THE HOSPITAL: WBC was 14,000, hemoglobin on presentation was 8.3, platelet count 707 and MCV was 68. Creatinine was 0.78 on presentation. CT abdomen and pelvis with contrast: Edema of the pyloric region and proximal portion of the duodenum may reflect peptic ulcer disease. No evidence of perforation.  A few stones in the dependent portion of  gallbladder. No pericholecystic inflammation . Hemoglobin came up to 10.7 on the 10th of August. Stool for occult blood was positive and hemoglobin dropped to 6.8 on 11th of August in the morning. After transfusion in the evening it came up to 10.6 again and then it remained at 9.6 and 9.7 on further follow-up.   TOTAL TIME SPENT ON THIS DISCHARGE: 45 minutes.     ____________________________ Ceasar Lund Anselm Jungling, MD vgv:dp D: 02/26/2013 08:08:00 ET T: 02/26/2013 08:49:28  ET JOB#: [id number]  cc: Ceasar Lund. Anselm Jungling, MD, <Dictator> Vaughan Basta MD ELECTRONICALLY SIGNED 03/16/2013 0:42

## 2014-11-04 NOTE — Consult Note (Signed)
PATIENT NAME:  Tasha Reyes, Tasha Reyes MR#:  [id number] DATE OF BIRTH:  Apr 07, 1931  DATE OF CONSULTATION:  02/20/2013  CONSULTING PHYSICIAN:  Manya Silvas, MD  The patient is an 81 year old white female who has onset of upper GI bleeding and she started having vomiting of blood about 11:00 a.m. this morning. She vomited initially red blood and then dark blood. She also has had dark blood in her stools today that started. For this problem, she came to the ER and was evaluated and made to admit her to the hospital for GI bleeding. I was asked to see her in consultation.   The patient has been taking BC and Goody's for pain. She also has been on Plavix because of a previous DVT and for a previous AAA/aneurysm, which was treated with a stent a few years ago by Dr. Hulda Humphrey.   MEDICATIONS: Lipitor 10 mg a day, Plavix 75 mg a day, Mag-Ox 400 mg daily.   PAST MEDICAL HISTORY: Status post AAA repair, status post hysterectomy, history of right lower extremity DVT, hypertension, recurrent cystitis.   ALLERGIES: No known drug allergies.   HABITS: History of tobacco use, at least 1/2 pack a day or more.   FAMILY HISTORY: Positive for strokes and coronary artery disease.   REVIEW OF SYSTEMS: The patient complaints of abdominal pain in the upper abdomen. Has vomiting and rectal bleeding. Denies wheezing. Denies coughing up blood. No chest pains. She did have a syncopal spell after a melenic stool today. No dysuria or hematuria. She has some back and abdominal pain. She did have a stroke in the 1970s. She has had previous colonoscopies and upper endoscopies at [facility]. Upper endoscopy done 02/12 showed grade B erosive esophagitis and gastritis, nonobstructing gastric ulcers with clean base. One nonbleeding crater duodenal ulcer was found in the second portion of the duodenum, 3 mm in size. She had a colonoscopy in 07/2006 that showed small-mouthed diverticula, a small 8 mm polyp that was removed, LA grade A reflux  esophagitis.   PHYSICAL EXAMINATION: GENERAL: Elderly white female, looks ill. VITAL SIGNS: Blood pressure 130/63 after a small fluid bolus, temp 97.5, pulse 114, respirations 18.  HEENT: Sclerae anicteric. Conjunctivae very pale. Tongue is not as pale. The head is atraumatic.  CHEST: Clear, with global decreased air flow.  HEART: Shows I/VI  systolic murmur.  ABDOMEN: Slightly distended. There is definite epigastric tenderness to palpation. No hepatosplenomegaly.  SKIN: Warm and dry.  NEUROLOGIC: The patient is awake, alert and oriented   LABORATORY AND RADIOLOGICAL DATA: Glucose 127, BUN 33, creatinine 0.78, sodium 135, potassium 4.2, chloride 103, CO2 of 24, calcium 8, total protein 6.4, albumin 3, total bilirubin 0.2, alkaline phosphatase 101, SGOT 26, SGPT 17. White blood count 14.2, hemoglobin 8.3, platelet count 707, MCV 68, MCH 21.7. A positive blood with a negative antibody screen. Urinalysis shows 1+ leukocyte esterase, 315 white cells per high-powered field. Chest x-ray shows no evidence of acute pneumonia or CHF, hyperinflation consistent with underlying COPD. A CAT scan of the abdomen showed fullness in the region of the edema in region of the pylorus and first portion of duodenum, possibly reflecting the presence of inflammation or ulceration, no evidence of perforation, no evidence of leakage from her previous AAA stent.   ASSESSMENT: Bleeding from nonsteroidal anti-inflammatory drugs causing ulceration and inflammation in the distal stomach and proximal duodenum with gastrointestinal bleeding in a patient who is also on Plavix. Her blood work indicates that she has probable iron deficiency  anemia, although iron studies have not been done yet. She has a very elevated platelet count and hypochromic microcytic indices. She may have been slowly bleeding in the past. The last hemoglobin I can pull up was in January of this year, and it was around a hemoglobin of 10.   PLAN: I agree with  plan to transfuse at least 2 units of blood. Will have a CBC drawn an hour after the second unit. Would like to transfuse her up before doing endoscopy. Plan to do endoscopy tomorrow morning after transfusions. I will follow with you.   ____________________________ Manya Silvas, MD rte:jm D: 02/20/2013 19:03:13 ET T: 02/20/2013 19:49:52 ET JOB#: [id number]  cc: Manya Silvas, MD, <Dictator> Leonie Douglas. Doy Hutching, MD Halina Maidens, MD Conni Slipper, MD Manya Silvas MD ELECTRONICALLY SIGNED 03/14/2013 16:22

## 2014-11-04 NOTE — H&P (Signed)
PATIENT NAME:  Tasha Reyes, STREET MR#:  [id number] DATE OF BIRTH:  May 16, 1931  DATE OF ADMISSION:  02/26/2013  ADMITTING PHYSICIAN:  Dr. Gladstone Lighter   PRIMARY CARE PHYSICIAN:   Dr. Halina Maidens  PRIMARY GI PHYSICIAN:  Dr. Vira Agar  CHIEF COMPLAINT:  Rectal bleed.   HISTORY OF PRESENT ILLNESS:  Ms. Ullman is an 81 year old Caucasian female with past medical history significant for ongoing smoking, abdominal aortic aneurysm status post repair, hypertension, hyperlipidemia, history of DVT on Plavix, who was here last week from 02/20/2013, and just got discharged on 02/24/2013 for upper GI bleed. Had an upper GI endoscopy done for hematemesis, which showed duodenal angiectasias and gastric and duodenal ulcers. The patient has actually required transfusion during that admission. She was discharged home. She felt fine, but woke up this morning and had 2 episodes of rectal bleeding. Fresh blood, according to the patient. Denies any abdominal cramping. Could not quantify the amount, but said it was mixed with stool and had filled the commode. She denies any nausea, vomiting, or hematemesis at this time. She comes back to the hospital, and her hemoglobin is actually 12.1, whereas her hemoglobin 2 days ago was 9.7, so she is being admitted for possible lower gastrointestinal bleed. The patient had colonoscopies twice in 2012, which showed diverticulosis at the time. Her upper GI endoscopy from last week in the past showed multiple gastric duodenal ulcers, and also angiectasias.    PAST MEDICAL HISTORY:  1.  Hypertension.  2.  Hyperlipidemia.  3.  History of gastric duodenal ulcers in duodenal angiectasias, resulting in upper GI bleed.  4.  Right lower extremity deep vein thrombosis.  5.  History of abdominal aortic aneurysm, status post repair.  6.  Ongoing smoking.   PAST SURGICAL HISTORY: 1.  Hemorrhoidectomy.  2.  Appendectomy.  3.  Hysterectomy.  4.  AAA repair.   ALLERGIES TO  MEDICATIONS:  No known drug allergies.   HOME MEDICATIONS: 1.  Lipitor 10 mg p.o. daily.  2.  Atenolol 25 mg p.o. daily.  3.  Ferrous sulfate 325 mg p.o. 3 times a day.  4.  Protonix 40 mg p.o. b.i.d.  The patient's Plavix was stopped 2 days ago at time of discharge because of her recent GI bleed.   SOCIAL HISTORY:  Lives at home with her son. Denies any alcohol use, but smokes about 1/2 pack per day.   FAMILY HISTORY:  Mom died during childbirth, and dad had a brain tumor and passed away in his 22s.   REVIEW OF SYSTEMS:    CONSTITUTIONAL:  No fever, fatigue or weakness.  EYES:  No blurry vision, double vision, inflammation or glaucoma. Uses reading glasses.  EARS, NOSE, THROAT:  No tinnitus, ear pain, hearing loss, epistaxis or discharge.  RESPIRATORY:  No cough, wheeze, hemoptysis or COPD.   CARDIOVASCULAR:  No chest pain, orthopnea, edema, arrhythmia, palpitations or syncope.  GASTROINTESTINAL: No nausea, vomiting, abdominal pain, hematemesis, melena. Positive for rectal bleed.  GENITOURINARY:  No dysuria, hematuria, renal calculus, frequency or incontinence.  ENDOCRINE:  No polyuria, nocturia, thyroid problems, heat or cold intolerance.  HEMATOLOGY:  No anemia, easy bruising or bleeding.  SKIN:  No acne, rash or lesions.  MUSCULOSKELETAL:  No neck, back, shoulder pain, arthritis or gout.  NEUROLOGIC:  No numbness, weakness, CVA, TIA, or seizures.  PSYCHOLOGICAL:  No anxiety, insomnia or depression.   PHYSICAL EXAMINATION: VITAL SIGNS:  Temperature 97.2 degrees Fahrenheit, pulse 91, respirations 18, blood pressure 125/60, pulse ox  94% on room air.  GENERAL EXAM:   A well-built, well-nourished female lying in bed, not in any acute distress.  HEENT: Normocephalic, atraumatic. Pupils equal, round, reacting to light. Anicteric sclerae. Extraocular movements intact. Oropharynx clear, without erythema, mass or exudates.  NECK:  Supple. No thyromegaly, JVD, or carotid bruits. No  lymphadenopathy.  LUNGS:  Moving air bilaterally. No wheeze or crackles. No use of accessory muscles for breathing.  CARDIOVASCULAR:  S1, S2. Regular rate and rhythm. No murmurs, rubs or gallops.  ABDOMEN:  Soft, nontender, nondistended. No hepatosplenomegaly. Normal bowel sounds.  EXTREMITIES:  No pedal edema. No clubbing or cyanosis. There are 2+ dorsalis pedis pulses palpable bilaterally.  SKIN:  No acne, rash or lesions.  LYMPHATIC:  No cervical or inguinal lymphadenopathy.  NEUROLOGIC:  Cranial nerves intact. No focal motor or sensory deficits.  PSYCHOLOGICAL:  The patient is awake, alert, oriented x 3.   LABORATORY DATA:  WBC 9.5, hemoglobin 12.1, hematocrit 35.0, platelet count 430. Sodium 131, potassium 3.8, chloride 99, bicarb 28, BUN 10, creatinine 0.75, glucose 113, and calcium of 8.3.   ASSESSMENT AND PLAN:  An 81 year old female with a history of hypertension, hyperlipidemia, history of deep vein thrombosis, recent admission 2 days ago for gastrointestinal bleed and esophagogastroduodenoscopy showing multiple ulcers. Discharged, and comes back with rectal bleed today.    1. Gastrointestinal bleed. It could be rapid upper GI bleed versus lower GI bleed. Last colonoscopy in 2012 twice revealed diverticulosis. EGD last week showing  duodenal and gastric ulcers and angiectasias. Start on Protonix IV drip, monitor hemoglobin. Hemoglobin appears to be stable, actually better than baseline. Baseline hemoglobin is at 10. She did require transfusion last admission. IV fluids and consult GI again.    2.  Hypertension. Continue atenolol. Monitor for any hypotension.   3.  Hyperlipidemia. She is on statin.   4.  Right leg deep vein thrombosis, off Plavix now, stable.  5.  Gastrointestinal and deep vein thrombosis prophylaxis. On Protonix drip, and also TEDS and SCDs.   CODE STATUS:  Full code.   Time spent on admission is  50 minutes.    ____________________________ Gladstone Lighter,  MD rk:mr D: 02/26/2013 21:33:40 ET T: 02/26/2013 22:25:02 ET JOB#: [id number]  cc: Gladstone Lighter, MD, <Dictator> Manya Silvas, MD Halina Maidens, MD   Gladstone Lighter MD ELECTRONICALLY SIGNED 03/08/2013 22:14

## 2014-11-04 NOTE — Consult Note (Signed)
Chief Complaint:  Subjective/Chief Complaint c/o mild epigastric distress but had been drinking OJ this AM.  Denies nausea or vomiting.   VITAL SIGNS/ANCILLARY NOTES: **Vital Signs.:   18-Aug-14 05:10  Temperature Temperature (F) 98  Celsius 36.6  Temperature Source oral  Pulse Pulse 61  Respirations Respirations 20  Systolic BP Systolic BP 599  Diastolic BP (mmHg) Diastolic BP (mmHg) 74  Mean BP 95  Systolic BP Systolic BP 774  Diastolic BP (mmHg) Diastolic BP (mmHg) 74  Systolic BP Systolic BP 142  Diastolic BP (mmHg) Diastolic BP (mmHg) 73  Systolic BP Systolic BP 395  Diastolic BP (mmHg) Diastolic BP (mmHg) 74  Pulse Ox % Pulse Ox % 92  Pulse Ox Activity Level  At rest  Oxygen Delivery Room Air/ 21 %   Brief Assessment:  GEN well developed, well nourished, no acute distress, A/Ox3.   Cardiac Regular   Respiratory normal resp effort   Gastrointestinal Normal   Gastrointestinal details normal Soft  Nontender  Nondistended  Bowel sounds normal  No rebound tenderness  No gaurding   EXTR negative cyanosis/clubbing, negative edema   Additional Physical Exam Skin: pink, warm, dry   Assessment/Plan:  Assessment/Plan:  Assessment Lower GI bleed: resolved Anemia: Hgb stable PUD   Plan 1) FU with [facility] for possible colonoscopy 2) Continue PPI daily   Electronic Signatures: Andria Meuse (NP)  (Signed 18-Aug-14 09:46)  Authored: Chief Complaint, VITAL SIGNS/ANCILLARY NOTES, Brief Assessment, Assessment/Plan   Last Updated: 18-Aug-14 09:46 by Andria Meuse (NP)

## 2014-11-04 NOTE — Consult Note (Signed)
Chief Complaint:  Subjective/Chief Complaint patient without any further bleeding. She has no complaints today. Her Hb is stable.   VITAL SIGNS/ANCILLARY NOTES: **Vital Signs.:   17-Aug-14 05:29  Vital Signs Type Routine  Temperature Temperature (F) 98.3  Celsius 36.8  Temperature Source oral  Pulse Pulse 74  Respirations Respirations 20  Systolic BP Systolic BP 975  Diastolic BP (mmHg) Diastolic BP (mmHg) 65  Mean BP 88  Pulse Ox % Pulse Ox % 91  Pulse Ox Activity Level  At rest  Oxygen Delivery Room Air/ 21 %   Brief Assessment:  GEN well developed, well nourished, no acute distress   Respiratory normal resp effort   Additional Physical Exam Alert and orientated times 3   Lab Results: Routine Hem:  17-Aug-14 06:48   WBC (CBC) 8.8  RBC (CBC)  3.70  Hemoglobin (CBC)  10.3  Hematocrit (CBC)  30.3  Platelet Count (CBC) 402  MCV 82  MCH 27.8  MCHC 33.9  RDW  21.1  Neutrophil % 73.1  Lymphocyte % 15.1  Monocyte % 9.9  Eosinophil % 1.3  Basophil % 0.6  Neutrophil # 6.4  Lymphocyte # 1.3  Monocyte # 0.9  Eosinophil # 0.1  Basophil # 0.1 (Result(s) reported on 28 Feb 2013 at 07:23AM.)   Assessment/Plan:  Assessment/Plan:  Assessment Lower GI bleed.   Plan No further bleeding. Last colonoscopy 2 years ago. Follow up as outpatient for possible repeat colonoscopy.   Electronic Signatures: Lucilla Lame (MD)  (Signed 17-Aug-14 07:53)  Authored: Chief Complaint, VITAL SIGNS/ANCILLARY NOTES, Brief Assessment, Lab Results, Assessment/Plan   Last Updated: 17-Aug-14 07:53 by Lucilla Lame (MD)

## 2014-11-04 NOTE — H&P (Signed)
PATIENT NAME:  Tasha Reyes, Tasha Reyes MR#:  [id number] DATE OF BIRTH:  Nov 13, 1930  DATE OF ADMISSION:  02/20/2013  REFERRING PHYSICIAN:  Dr. Cinda Quest  FAMILY PHYSICIAN:  Dr. Army Melia   REASON FOR ADMISSION:  Upper GI bleed.   HISTORY OF PRESENT ILLNESS:  The patient is an 81 year old female with a history of hypertension and hyperlipidemia. Has a history of AAA repair and right lower extremity DVT, on Plavix therapy. Has been having problems of orthostasis over the past 1 to 2 weeks as an outpatient. Was recently taken off atenolol. Presents to the Emergency Room today with acute onset of abdominal pain associated with melena and hematemesis. In the Emergency Room, the patient was noted to be tachycardic and anemic. She was pale. Stools guaiac-positive. She is now admitted for further evaluation.   PAST MEDICAL HISTORY: 1.  Benign hypertension.  2.  Hyperlipidemia.  3.  History of cystitis.  4.  History of right lower extremity DVT.   5.  Status post AAA repair.  6.  Status post hysterectomy.   MEDICATIONS: 1.  Lipitor 10 mg p.o. at bedtime.  2.  Plavix 75 mg p.o. daily.  3.  Mag-Ox 400 mg p.o. daily.   ALLERGIES:  No known drug allergies.   SOCIAL HISTORY:  The patient does have a history of tobacco abuse. No history of alcohol abuse.   FAMILY HISTORY:  Positive for stroke and coronary artery disease. Negative for breast or colon cancer.   REVIEW OF SYSTEMS:   CONSTITUTIONAL:  No fever or change in weight.  EYES:  No blurry or double vision. No glaucoma.  EARS, NOSE, THROAT: No tinnitus or hearing loss. No nasal discharge or bleeding. No difficulty swallowing.  RESPIRATORY:  No cough or wheezing. Denies hemoptysis.  CARDIOVASCULAR:  No chest pain or orthopnea. No palpitations. Did have syncope after a melanotic stool earlier today.  GASTROINTESTINAL: As per HPI.   GENITOURINARY:  No dysuria or hematuria. No incontinence.  ENDOCRINE:  No polyuria or polydipsia. No heat or cold  intolerance.  HEMATOLOGIC: The patient denies anemia or easy bruising. Bleeding as per HPI. LYMPHATIC:  No swollen glands.  MUSCULOSKELETAL: The patient does have some back and abdominal pain. No neck, shoulder, knee or hip pain. No gout.  NEUROLOGIC:  No numbness or migraines. Denies stroke or seizures. Does have weakness.  PSYCHIATRIC:  The patient denies anxiety, insomnia or depression.   PHYSICAL EXAMINATION: GENERAL:  The patient is acutely ill-appearing, in mild distress.  VITAL SIGNS:  Remarkable for a blood pressure of 129/60, heart rate of 115, and respiratory rate of 18. She is afebrile.  HEENT: Normocephalic, atraumatic. Pupils equally round and reactive to light and accommodation. Extraocular movements are intact. Sclerae are nonicteric. Conjunctivae are pale.  Oropharynx is dry, but clear.  NECK:  Supple, without JVD. No adenopathy or thyromegaly is noted.  LUNGS: Reveal decreased breath sounds, without wheezes, rales or rhonchi. No dullness. Respiratory effort is normal.  CARDIAC:  Exam is a rapid rate with a regular rhythm. Normal S1, S2. There is a 2/6 systolic murmur noted. No rubs or gallops are present.  ABDOMEN:  Soft, but diffusely tender. There is guarding, but no rebound. Normoactive bowel sounds. No organomegaly or masses were appreciated. No hernias or bruits were noted.  RECTAL:  Exam revealed guaiac stool, per the Emergency Room physician.  EXTREMITIES:  Without clubbing, cyanosis, edema. Pulses were 2+ bilaterally.  SKIN:  Warm and dry, without rash or lesions.  NEUROLOGIC: Exam revealed  cranial nerves II through XII grossly intact. Deep tendon reflexes were symmetric. Motor and sensory exams nonfocal.  PSYCHIATRIC: Revealed the patient was alert and oriented to person, place and time. She was cooperative and used good judgment.   LABORATORY DATA:  Glucose is 127, with a BUN of 33, creatinine of 0.78, with a GFR of greater than 60. Sodium 135, with potassium of 4.2.  Troponin less than 0.02. White count was 14.2, with a hemoglobin of 8.3, and a platelet count of 707. MCV was 68. Chest x-ray revealed COPD, but was otherwise unremarkable. CT of the abdomen and pelvis revealed edema of the pyloric region and proximal portion of the duodenum. There was no gastric outlet obstruction noted. She did have some gallstones. EKG revealed sinus tachycardia, with no acute ischemic changes.   ASSESSMENT: 1.  Acute upper gastrointestinal bleed, with hematemesis and melena.  2.  Anemia from acute hemorrhage.  3.  Tachycardia.  4.  Hyperglycemia.  5.  Hyponatremia.  6.  Peripheral vascular disease.  7.  Benign hypertension.  8.  Hyperlipidemia.  9.  Chronic obstructive pulmonary disease/tobacco abuse.   PLAN:  The patient will be admitted as a FULL CODE to the Intensive Care Unit on a Protonix drip and IV fluids. She will be n.p.o. except for ice chips and medications. Will go ahead and crossmatch 4 units of red blood cells, but transfuse 2 units at this time. Will consult GI urgently for further treatment and evaluation. Will guaiac all stools and follow her hemoglobin closely. Follow up routine labs in the morning post-transfusion. Further treatment and evaluation will depend upon the patient's progress.   Total time spent on this patient was 50 minutes.      ____________________________ Leonie Douglas Doy Hutching, MD jds:mr D: 02/20/2013 18:04:27 ET T: 02/20/2013 18:26:46 ET JOB#: [id number]  cc: Leonie Douglas. Doy Hutching, MD, <Dictator> Halina Maidens, MD   JEFFREY Lennice Sites MD ELECTRONICALLY SIGNED 02/20/2013 19:52

## 2014-11-04 NOTE — Consult Note (Signed)
CC: UGI bleed.  two ulcers in antrum, one with pigmented spots, no active bleeding at this time, no blood in stomach, low likelyhood of rebleeding.  Given on Plavix and its effect on clotting i decided to not treat this ulcer at this time.  Continue medical treatment and add in prn Mylanta, start clear liq diet without carbonated beverages.  Can go to floor later today.  Electronic Signatures: Manya Silvas (MD)  (Signed on 10-Aug-14 10:37)  Authored  Last Updated: 10-Aug-14 10:37 by Manya Silvas (MD)

## 2014-11-04 NOTE — Discharge Summary (Signed)
PATIENT NAME:  Tasha Reyes, Tasha Reyes MR#:  [id number] DATE OF BIRTH:  November 01, 1930  DATE OF ADMISSION:  02/26/2013 DATE OF DISCHARGE:  03/01/2013  ADMITTING PHYSICIAN: Gladstone Lighter, MD  DISCHARGING PHYSICIAN:  Gladstone Lighter, MD  PRIMARY CARE PHYSICIAN: Dr. Halina Maidens.  PRIMARY GASTROENTEROLOGIST: Dr. Vira Agar.   CONSULTATIONS IN THE HOSPITAL: GI consultation by Dr. Lucilla Lame.     DISCHARGE DIAGNOSES: 1.  Lower gastrointestinal bleed from diverticulosis.  2.  Recent admission for upper gastrointestinal bleed last week from gastritis and ulcerative disease.  3.  Status post blood transfusions during past admission.  4.  Anemia of chronic disease from gastrointestinal bleed.  5.  Hypertension.  6.  Hyperlipidemia.  7.  Ongoing smoking.  8.  Dizziness related to inner ear problems.   DISCHARGE HOME MEDICATIONS:  1.  Lipitor 10 mg p.o. daily.  2.  Atenolol 25 mg p.o. daily.  3.  Protonix 40 mg p.o. b.i.d.  4.  Ferrous sulfate 325 mg p.o. 3 times a day.  5.  Tylenol 650 mg p.o. q.6 hours p.r.n. for pain.   DISCHARGE DIET: Low-sodium diet.   DISCHARGE ACTIVITY: As tolerated.     FOLLOWUP INSTRUCTIONS: 1.  Follow up with Dr. Vira Agar in 2 weeks.  2a PCP followup in 3 weeks.   LABORATORY DATA AND IMAGING STUDIES: Prior to discharge, WBC 8.8, hemoglobin 10.3, hematocrit 30.3, platelet count 402.   Sodium 137, potassium 3.6, chloride 105, bicarb 26, BUN 4, creatinine 0.72, glucose 91.   Abdominal x-ray for epigastric pain showing COPD changes in the lower lung fields. No evidence of bowel obstruction or perforation noted.   BRIEF HOSPITAL COURSE: Tasha Reyes is an 81 year old Caucasian female with past medical history significant for chronic anemia on iron supplements, hypertension, ongoing smoking, who was recently in the hospital from 20 February 2013 to 24 February 2013 for upper GI bleed requiring blood transfusions, and EGD showing ulcerative disease and gastritis. She was  discharged home on 02/24/2013, but came back again on 02/26/2013, to the ER secondary to blood in the stool. In the past admission, as mentioned above, the patient has required blood transfusion, but when she comes to the ER this admission, her hemoglobin was stable.    1.  Rectal bleed, likely lower gastrointestinal bleed this time. The patient had recent colonoscopies done twice in 2004, which showed diverticulosis at both times. She was admitted to the hospital, seen by GI, has not had further rectal bleed while in the hospital, and her hemoglobin has remained stable, around 10 without requiring any transfusion this admission. She was advised to follow up with GI as an outpatient. She was on Plavix prior to last hospitalization for previous history of thrombosis and that was discontinued, and the patient was advised to stay away from it for now.  2.  Dizziness. The patient has had dizziness, mostly related to position, so she was monitored for an extra day.  Orthostatic vital signs were taken, did not show any hypotension, and it was more like a benign positional vertigo. The patient has had this for a long period of time and has not required any meclizine while in the hospital.  3. Hypertension, continue atenolol.  4.  Gastroesophageal reflux disease. She is on Protonix b.i.d.  5.  Hyperlipidemia. She is on Lipitor.   Her course has been otherwise uneventful in the hospital.   DISCHARGE CONDITION: Stable.   DISCHARGE DISPOSITION: Home with home health.   TIME SPENT ON DISCHARGE: 40 minutes.  ____________________________ Gladstone Lighter, MD rk:dmm D: 03/08/2013 20:56:00 ET T: 03/08/2013 21:28:14 ET JOB#: [id number]  cc: Gladstone Lighter, MD, <Dictator> Manya Silvas, MD Halina Maidens, MD  Gladstone Lighter MD ELECTRONICALLY SIGNED 03/24/2013 16:07

## 2014-11-04 NOTE — Consult Note (Signed)
CHIEF COMPLAINT and HISTORY:  Subjective/Chief Complaint GI bleed, consulted to r/o vascular stent related bleed   History of Present Illness 81 year old white female admitted 02/20/13 with GI bleed. Has had 1-2 weeks of orthostasis as outpatient. On Saturday (2 days ago) she began having hematemesis and melena which was the reason she presented to the emergency department. Found to be tachycardic and anemic. Also reports diffuse abdominal pain- currently 8/10 on pain scale. Has not had a bowel movement today but did he melena last night. No further vomiting. Denies prior GI bleed. She has hx of gastric and esophageal ulcers on EGD in 2012 and diverticulosis on colonoscopy in the past. She has received 4 units PRBC. Hgb today 10.7  Upper endoscopy 8/10 and 8/11 finding gastric ulcers with clean base, 2 nonbleeding angioectasias in stomach and one in duodenum treated with thermal therapy.  She has history of AAA and right iliac occlusion s/p endovascular repair and femoral to femoral bypass in 2008. S/p declot of femfem bypass in May 2013. Denies claudication or lower extremity ulcerations.   PAST MEDICAL/SURGICAL HISTORY:  Past Medical History:   Cystitis:    HTN:    high cholesterol:    Femoral femoral bypass: 2008   blood clot removed from right leg:    Hysterectomy - Partial:    AAA - Abdominal Aortic Aneurysm Repair: Feb 2008  ALLERGIES:  Allergies:  No Known Allergies:   HOME MEDICATIONS:  Home Medications: Medication Instructions Status  Lipitor 10 mg oral tablet 1 tab(s) orally once a day (at bedtime) Active  Plavix 75 mg oral tablet 1 tab(s) orally once a day (at bedtime) Active   Family and Social History:  Family History Hypertension  Cancer   Social History negative tobacco, negative Illicit drugs   Review of Systems:  Fever/Chills No   Abdominal Pain Yes   Diarrhea No   Nausea/Vomiting Yes   SOB/DOE No   Chest Pain No   Physical Exam:  GEN no acute  distress   HEENT PERRL, hearing intact to voice, moist oral mucosa   RESP normal resp effort  clear BS   CARD regular rate  no carotid bruits   VASCULAR ACCESS palpable femfem pulsation   ABD positive tenderness  soft  diffuse abdominal tenderness, nondistended, no guarding   LYMPH negative neck   EXTR negative cyanosis/clubbing, negative edema, no discoloration   SKIN normal to palpation, No rashes, No ulcers, pale   NEURO cranial nerves intact   PSYCH alert, A+O to time, place, person   LABS:  Laboratory Results: Routine UA:    09-Aug-14 18:57, Urinalysis  Color (UA) Yellow  Clarity (UA) Hazy  Glucose (UA) Negative  Bilirubin (UA) Negative  Ketones (UA) Trace  Specific Gravity (UA) 1.029  Blood (UA) Negative  pH (UA) 6.0  Protein (UA) Negative  Nitrite (UA) Negative  Leukocyte Esterase (UA) Negative  Result(s) reported on 20 Feb 2013 at 07:17PM.  RBC (UA)   NONE SEEN  WBC (UA) <1 /HPF  Bacteria (UA)   NONE SEEN  Epithelial Cells (UA)   NONE SEEN   Result(s) reported on 20 Feb 2013 at 07:17PM.  Routine Hem:    11-Aug-14 04:49, Hemoglobin  Hemoglobin (CBC) 6.8  Result(s) reported on 22 Feb 2013 at 06:07AM.   RADIOLOGY:  Radiology Results: LabUnknown:    09-Aug-14 16:39, CT Abdomen and Pelvis With Contrast  PACS Image  CT:  CT Abdomen and Pelvis With Contrast  REASON FOR EXAM:    (  1) abd pain, gi bleed; (2) abd pain, gi bleed  COMMENTS:       PROCEDURE: CT  - CT ABDOMEN / PELVIS  W  - Feb 20 2013  4:39PM     RESULT: Axial CT scanning was performed through the abdomen and pelvis   with reconstructions at 3 mm intervals and slice thicknesses following   intravenous administration of 100 cc of Isovue-300. The patient did not   receive oral contrast material.    The stomach is moderately distended with gas and food particles and   radiodense material. Someof the increased density may reflect blood   products. The perigastric soft tissues are normal in  appearance. There is   a small hiatal hernia. There is subjective edema of the pylorus and first   portion of the duodenum which may reflect the presence of the ulcer     disease. I do not see evidence to suggest perforation.     The second and third and fourth portions of the duodenum appear normal.   The small and large bowel exhibit no evidence of ileus nor obstruction.   There is sigmoid diverticulosis without evidence of acute diverticulitis.    There are a few radiodense stones layering in the dependent portion the   mildly distended gallbladder. The liver, pancreas, spleen, adrenal   glands, and kidneys are normal in appearance.    The patient has undergone gone previous aortoiliac stent grafting for a   mid abdominal aortic aneurysm. The maximal measured dimension of the   native lumen is 3.4 cm. There is no definite evidence of periaortic   leakage of blood. There is no contrasted blood within the right channel   of the iliac portion of the stent but there is a patent channel extending     from the proximal portion of the stent graft into the left iliac   component. The patient has undergone previous femoral-femoral bypass   grafting which supplies the right lower extremity. Overall the findings   appear stable since 2010.    In the pelvis there are hypodensities in the adnexal regions most   compatible with cystic ovarian processes. On the right the largest   measures 4.5 cmin greatest dimension. On the left at in greatest   dimension is 3.2 cm. There is no free pelvic fluid. The uterus is   surgically absent. The partially distended urinary bladder is normal in   appearance. There is no inguinal nor umbilical hernia.    IMPRESSION:   1. There is edema of the pyloric region and proximal portion of the   duodenum that may reflect peptic ulcer disease. I do not see evidence of     a perforated ulcer. There is no gastric outlet obstruction or duodenal   obstruction.  2.  There are a few stones layering in the dependent portion of the   gallbladder. There is no pericholecystic inflammatory change.  3. There is no acute urinary tract abnormality. There are hypodensities   within the adnexal regions bilaterally. On thestudy of August 2010 there   were similar findings demonstrated in the pelvis.  3. There is no acute bowel abnormality beyond the duodenum.  4. There are extensive vascular abnormalities associated with the   abdominal aorta, common iliac vessels, and femoral vessels. When compared   to the study of August 2010, there is no significant interval change.     Dictation Site: 5      Verified By: [employer]  A. [place], M.D., MD   ASSESSMENT AND PLAN:  Assessment/Admission Diagnosis 81 year old white female admitted 02/20/13 with GI bleed. Has had 1-2 weeks of orthostasis as outpatient. On Saturday (2 days ago) she began having hematemesis and melena which was the reason she presented to the emergency department. Found to be tachycardic and anemic. Also reports diffuse abdominal pain- currently 8/10 on pain scale. Has not had a bowel movement today but did he melena last night. No further vomiting. Denies prior GI bleed. She has hx of gastric and esophageal ulcers on EGD in 2012 and diverticulosis on colonoscopy in the past. She has received 4 units PRBC. Hgb today 10.7  Upper endoscopy 8/10 and 8/11 finding gastric ulcers with clean base, 2 nonbleeding angioectasias in stomach and one in duodenum treated with thermal therapy.  She has history of AAA and right iliac occlusion s/p endovascular repair and femoral to femoral bypass in 2008. S/p declot of femfem bypass in May 2013.   Plan Currently being treated for gastric ulcerations- PPI.  Ddx: GI bleed from gastric ulcerations or angioectasias, diverticular bleed, aortoenteric fistula hx AAA repair  D/w Dr. Delana Meyer. Dr. Delana Meyer will review CT scan. Consulted to r/o vascular stent related bleed. Her AAA was  repaired with endograft which has not been known to cause aortoenteric fistula. No emergent surgery. Continue treatment of gastric ulcerations and closey monitor H&H. Will follow.   Electronic Signatures: Su Grand (PA-C)  (Signed 11-Aug-14 19:17)  Authored: Chief Complaint and History, PAST MEDICAL/SURGICAL HISTORY, ALLERGIES, HOME MEDICATIONS, Family and Social History, Review of Systems, Physical Exam, LABS, RADIOLOGY, Assessment and Plan   Last Updated: 11-Aug-14 19:17 by Su Grand (PA-C)

## 2014-11-04 NOTE — Consult Note (Signed)
CC: GI bleeding.  Pt without bleeding during nite.  Hgb 9.6 today.  Vascular surgery did not feel previous grafts playing a role in current problems.  VSS afebrile.  Will start full liquids without grits.  If stable could go home tomorrow on carafate and PPI.  Her ulcers improved from Sunday to Monday with no visible vessell or pigmented areas yesterday.  Electronic Signatures: Manya Silvas (MD)  (Signed on 12-Aug-14 07:01)  Authored  Last Updated: 12-Aug-14 07:01 by Manya Silvas (MD)

## 2014-11-04 NOTE — Consult Note (Signed)
Brief Consult Note: Diagnosis: The patient came with what appears to be a lower GI bleed. She was ust discharged with an upper GI bleed but now has BRBPR and stable Hb and stable BP.   Patient was seen by consultant.   Comments: The patent has not had any bleeding since last nigh. Last colonoscopy 2 years ago with diverticulosis. Recent upper GI ulcer. Not likely the cause of BRBPR. Will follow.  Electronic Signatures: Lucilla Lame (MD)  (Signed 16-Aug-14 09:20)  Authored: Brief Consult Note   Last Updated: 16-Aug-14 09:20 by Lucilla Lame (MD)

## 2014-11-06 NOTE — Consult Note (Signed)
PATIENT NAME:  Tasha Reyes, Tasha Reyes MR#:  [id number] DATE OF BIRTH:  Jan 26, 1931  DATE OF CONSULTATION:  11/13/2011  REFERRING PHYSICIAN:   CONSULTING PHYSICIAN:  Algernon Huxley, MD  REASON FOR CONSULTATION: Right lower extremity ischemia.   HISTORY OF PRESENT ILLNESS: This is a 81 year old white female who was admitted with urinary tract infection symptoms, some hypoxia and right leg pain. We were consulted by the internal medicine service due to the fact that her right leg had been painful and cool for approximately a week. She has extensive vascular history and had a right common iliac artery occlusion and an abdominal aortic aneurysm treated with an aortouniiliac stent graft to the left with a femoral to femoral bypass left to right in 2008 by Dr. Hulda Humphrey. This has not been checked in some years. She was walking normally and not having leg pain up until about a week ago. She presents with some numbness and tingling of her forefoot and she has been started on a heparin drip due to presumed ischemia and this has not really improved her symptoms. A CT angiogram of the abdomen and pelvis as well as been performed which I have independently reviewed. This shows a patent stent graft from the aorta to the left iliac system. The left to right femoral to femoral bypass is occluded. Her right femoral artery and the common femoral, proximal profunda femoris, and superficial femoral artery are occluded. She does reconstitute more distally and seems to have reasonable runoff distally. The visualization was somewhat limited due to her malperfusion, in the right lower extremity though.  PAST MEDICAL HISTORY:  1. Hypertension.  2. Hyperlipidemia.  3. Chronic obstructive pulmonary disease.  4. Peripheral arterial disease, as described above.  5. Abdominal aortic aneurysm, as described above.   PAST SURGICAL HISTORY:  1. Abdominal aortic aneurysm repair.  2. Femoral to femoral bypass for right iliac occlusion.   3. Back surgery.  4. Hysterectomy.   ALLERGIES: No known drug allergies.   HOME MEDICATIONS:  1. Atenolol 25 mg daily. 2. Lipitor 10 mg daily.   SOCIAL HISTORY: She lives at home with her husband and smoked for many years but quit. She smoked at least a pack a day prior to quitting No alcohol abuse.  FAMILY HISTORY: Father had brain cancer. Mother died from childbirth.   REVIEW OF SYSTEMS: CONSTITUTIONAL: She did have some low-grade fevers and fatigue. No intentional weight loss or gain. EYES: No blurred or double vision. EARS: No tinnitus or ear pain. CARDIOVASCULAR: No chest pain or palpitations. RESPIRATORY: Chronic shortness of breath and chronic obstructive pulmonary disease, no acute worsening. GI: Positive for some nausea. No vomiting or diarrhea. GENITOURINARY: Positive for dysuria and frequency of urine. ENDOCRINE: No heat or cold intolerance. PSYCH: No anxiety or depression. NEURO: Some numbness in her right foot, but no other TIA, stroke, or seizure symptoms. SKIN: No new rashes or ulcers. MUSCULOSKELETAL: Right leg pain.   PHYSICAL EXAMINATION:   GENERAL: This is a well-developed, well-nourished white female who is not in acute distress.   VITAL SIGNS: Temperature 98, pulse 66, blood pressure 125/78, and saturations 95%.   HEAD: Normocephalic and atraumatic.   EYES: Sclerae anicteric. Conjunctivae are clear.   EARS: Normal external appearance. Hearing is slightly decreased.   NECK: Supple without adenopathy or jugular venous distention. Carotids have good upstroke. I do not appreciate bruits.  HEART: Regular rate and rhythm without murmurs.   LUNGS: Slightly diminished but clear bilaterally.   ABDOMEN: Soft, nondistended,  and nontender. There is not an increased aortic pulsation.  PULSE EXAM: She has no palpable right femoral pulse and no palpable right pedal pulse. She does have a monophasic posterior tibial and dorsalis pedis pulse by Doppler. On the left, her  femoral pulse is 2+. Her left posterior tibial pulse is 1+. Her dorsalis pedis pulses are trace.   NEUROLOGIC: She has some mild decreased sensation in the right foot. Gross motor and tone is normal.   PSYCH: Normal affect and mood.   SKIN: Warm and dry with the exception of the right lower extremity being cool, but not cold to the touch.   It is not mottled or cyanotic.  LABS/STUDIES: Sodium 142, potassium 3.9, chloride 112, CO2 23, BUN 8, creatinine 0.89, and glucose 116. White blood cell count 8.1, hemoglobin 10.0, and platelet count 343,000.   ASSESSMENT AND PLAN: This is a 81 year old white female with right lower extremity ischemia which is acute. She has chronic right iliac occlusion. Her femoral to femoral bypass graft is down on her CT scan and clinically. Right now she has ischemic rest pain due to her malperfusion and she was unable to bear weight or walk over the past couple of days. This is clearly a limb threatening situation. I have discussed with her the likelihood of amputation if we were unable to revascularize her. I have discussed different options for revascularization and will begin with an angiogram. In reviewing her CT scan, I do believe we can gain access to the graft from the left common femoral artery below the anastomosis. If this is technically possible, she would likely need a thrombolysis procedure. I have discussed the risks and benefits of that including life-threatening hemorrhage. She is interested in taking any measure to save her leg and we will proceed urgently.  This is a level 5 consultation.  ____________________________ Algernon Huxley, MD jsd:slb D: 11/14/2011 13:35:00 ET     T: 11/14/2011 16:41:32 ET        JOB#: [id number] cc: Algernon Huxley, MD, <Dictator> Algernon Huxley MD ELECTRONICALLY SIGNED 12/03/2011 12:09

## 2014-11-06 NOTE — H&P (Signed)
PATIENT NAME:  Tasha Reyes, Tasha Reyes MR#:  [id number] DATE OF BIRTH:  April 11, 1931  DATE OF ADMISSION:  11/12/2011  ADMITTING PHYSICIAN: Gladstone Lighter, MD   PRIMARY CARE PHYSICIAN: Currently none. She used to see Dr. Meredith Staggers but has not seen him for more than one year.   CHIEF COMPLAINT: Urinary tract infection symptoms and also right leg pain.   HISTORY OF PRESENT ILLNESS: Tasha Reyes is a 81 year old pleasant Caucasian female with past medical history significant for hypertension, hyperlipidemia, COPD, peripheral arterial disease with history of AAA and right common iliac artery obstruction in the past status post repair by Dr. Hulda Humphrey in 2008 who comes to the hospital with the above-mentioned complaints. The patient has had history of chronic cystitis and previous UTIs, the last one being more than six months ago treated with Bactrim orally. She actually went to Urgent Care today because she has been having increased frequency of urination, incontinent, and dysuria for two days now. Low-grade fevers of 100 degrees Fahrenheit at home with some nausea, vomiting, and hematuria. When she was at Urgent Care, she also mentioned that she has been having right leg pain, knee down, mostly on walking and also very tender to touch. Her right leg was cool, not able to palpate pulses, so she was sent in to ER. She did have Dopplerable femoral and popliteal pulse on the right leg and very diminished posterior tibial right leg pulse but her left leg pulses were normal. Dr. Lucky Cowboy from V[facility] Surgery was contacted and the patient will be having CT angiography tomorrow morning and started on heparin drip now.   PAST MEDICAL HISTORY:  1. Hypertension.  2. Hyperlipidemia.  3. COPD with prior tobacco use.  4. Peripheral arterial disease with history of right iliac artery obstruction status post fem-fem bypass in 2008 by Dr. Hulda Humphrey.  5. AAA status post endovascular stent placement in 2008.   PAST SURGICAL HISTORY:   1. AAA repair.  2. Fem-fem bypass for right leg.  3. Back surgery.  4. Partial hysterectomy.   ALLERGIES TO MEDICATIONS: No known drug allergies.   CURRENT MEDICATIONS AT HOME:  1. Atenolol 25 mg p.o. daily.  2. Lipitor 10 mg p.o. daily.   SOCIAL HISTORY: Lives at home with husband. Used to smoke about 1 pack per day, quit more than 10 years ago. No alcohol use.   FAMILY HISTORY: Dad had brain cancer and mom died from childbirth very young.  REVIEW OF SYSTEMS: CONSTITUTIONAL: Positive for low-grade fever, fatigue. No weight loss or weight gain. EYES: Uses reading glasses. No blurred vision, double vision, glaucoma, or cataracts. ENT: No tinnitus, ear pain, hearing loss, epistaxis, or discharge. RESPIRATORY: No cough, wheeze, hemoptysis, or painful respiration. Positive for COPD. CARDIOVASCULAR: No chest pain, orthopnea, edema, arrhythmia, palpitations, or syncope. GI: Positive for nausea. No vomiting, diarrhea, abdominal pain, hematemesis, or rectal bleed. GU: Positive for dysuria, hematuria, increased frequency, and incontinence. No renal calculus. ENDOCRINE: No polyuria, nocturia, thyroid problems, heat or cold intolerance. HEMATOLOGY: No anemia, easy bruising or bleeding. SKIN: No acne, rash, or lesions. MUSCULOSKELETAL: Positive for right leg pain and lower back pain. NEUROLOGIC: No numbness, weakness, CVA, TIA, or seizures. PSYCHOLOGICAL: No anxiety, insomnia, or depression.   PHYSICAL EXAMINATION:   VITAL SIGNS: Temperature 97.6 degrees Fahrenheit, pulse 70, respirations 16, blood pressure 171/76, pulse oximetry 91% on room air.   GENERAL: Well built, well nourished female lying in bed not in any acute distress.   HEENT: Normocephalic, atraumatic. Pupils equal, round, reacting to  light. Anicteric sclerae. Extraocular movements intact. Oropharynx clear without erythema, mass, or exudates.   NECK: Supple. No lymphadenopathy. Possible mild thyromegaly, maybe right lobe of the thyroid  gland enlarged. No tenderness. No JVD.   LUNGS: Moving air bilaterally. No wheeze or crackles. No use of accessory muscles for breathing.   CARDIOVASCULAR: S1, S2 regular rate and rhythm. No murmurs, rubs, or gallops.   ABDOMEN: Soft, nontender, nondistended. No hepatosplenomegaly. Normal bowel sounds.   EXTREMITIES: No pedal edema. No clubbing or cyanosis. Right foot is slightly mottled and unable to palpate any dorsalis pedis or popliteal pulse. Very feeble femoral pulse palpable. Left foot normal dorsalis pedis pulses and tibial pulses.   LYMPHATICS: No cervical lymphadenopathy.   NEUROLOGIC: Cranial nerves intact. No focal motor or sensory deficits.   PSYCHOLOGICAL: The patient is awake, alert, oriented x3.   LABORATORY, DIAGNOSTIC, AND RADIOLOGICAL DATA: WBC 10.7, hemoglobin 11.7, hematocrit 36.5, platelet count 439, sodium 139, potassium 4.6, chloride 107, bicarb 27, BUN 9, creatinine 0.99, glucose 104, calcium 8.9, ALT 13, AST 19, alkaline phosphatase 109, total bilirubin 0.4, albumin 3.7. Urinalysis showing nitrite positive, leukocyte esterase 1+, few WBCs, no bacteria seen. INR 1.0.   Chest x-ray showing mild hyperinflation consistent with COPD and early atelectasis or infiltrate in the right lower lobe posteriorly.   CT of the chest for pulmonary embolism showing no evidence of acute PE. Emphysematous changes in both lungs. No evidence of CHF or pneumonia. Soft tissue fullness in the superior mediastinum which may be related to thyroid gland.   ASSESSMENT AND PLAN: This is a 81 year old female with history of hypertension, hyperlipidemia, peripheral arterial disease with prior right iliac artery occlusion status post bypass surgery in 2008 who presents with urinary tract infection symptoms and also claudication of her right leg.  1. Peripheral arterial disease with right leg claudication and poor pulses for one week. Known history of PAD and right iliac artery occlusion disease in  the past and AAA status post repair by Dr. Hulda Humphrey in 2008. Discussed with Dr. Lucky Cowboy. Started on IV heparin drip. CT angiography for aortoiliac and fem artery in the morning and further treatment after that.  2. Urinary tract infection symptoms. Known history of chronic urinary tract infections. Urinalysis with nitrite and leukocyte esterase positivity. Urine culture sent. Started on Levaquin.  3. Hypertension. Continue atenolol.  4. Hyperlipidemia. On Lipitor.  5. COPD with transient hypoxia in ED. CT chest negative for PE. Currently stable. Wean oxygen as tolerated.   CODE STATUS: FULL CODE.   TIME SPENT ON ADMISSION: 50 minutes.   ____________________________ Gladstone Lighter, MD rk:drc D: 11/12/2011 22:19:14 ET T: 11/13/2011 08:28:11 ET JOB#: [id number]  cc: Gladstone Lighter, MD, <Dictator> Gladstone Lighter MD ELECTRONICALLY SIGNED 11/19/2011 14:25

## 2014-11-06 NOTE — Op Note (Signed)
PATIENT NAME:  Tasha Reyes, Tasha Reyes MR#:  [id number] DATE OF BIRTH:  06/16/1931  DATE OF PROCEDURE:  11/13/2011  PREOPERATIVE DIAGNOSES:  1. Acute right lower extremity ischemia with rest pain.  2. Occluded femoral to femoral bypass.  3. Abdominal aortic aneurysm, status post repair.   POSTOPERATIVE DIAGNOSES:  1. Acute right lower extremity ischemia with rest pain.  2. Occluded femoral to femoral bypass.  3. Abdominal aortic aneurysm, status post repair.   PROCEDURE: 1. Angiogram through existing catheters, fem-fem bypass right lower extremity. 2. Catheter placement into right posterior tibial artery from left femoral approach.  3. Mechanical rheolytic thrombectomy to right femoral anastomosis with the AngioJet AVX catheter.  4. Mechanical rheolytic thrombectomy to right distal popliteal artery, tibioperoneal trunk, and proximal posterior tibial artery with the Omni AngioJet catheter.  5. Percutaneous transluminal angioplasty of right femoral anastomosis with a 7- mm diameter angioplasty balloon.  6. Percutaneous transluminal angioplasty of distal popliteal artery, tibioperoneal trunk, and proximal posterior tibial artery with 4- mm diameter angioplasty balloon.  7. StarClose closure device left femoral artery.   SURGEON: Algernon Huxley, M.D.   ANESTHESIA: Local with moderate conscious sedation.   ESTIMATED BLOOD LOSS: During this procedure was minimal.   INDICATION FOR PROCEDURE: 81 year old white female who had lysis started earlier today, is brought back for a second-look angiogram and further revascularization if possible. Risks and benefits were discussed. Informed consent was obtained.   DESCRIPTION OF PROCEDURE: The patient is returned to the vascular interventional radiology suite. Her existing lysis catheter was rewired and removed, and angiogram through the left femoral sheath demonstrated the left femoral anastomosis to be patent. The proximal portion of the graft is patent.  There was residual thrombosis at the right femoral anastomosis, which represented the distal anastomosis.  A wire was placed into the superficial femoral artery and the AngioJet AVX catheter was used to treat this area with mechanical rheolytic thrombectomy. I then ballooned it with a 7-mm diameter angioplasty balloon. Following this, the graft was now patent. On completion of the runoff there was thrombosis at the tibioperoneal trunk. This was crossed with the Magic torque wire and the AngioJet Omni catheter was used to perform mechanical rheolytic thrombectomy in the distal popliteal artery, tibioperoneal trunk, and proximal posterior tibial artery. When there was residual thrombosis after mechanical rheolytic thrombectomy, a 4-mm diameter angioplasty balloon was then inflated in these locations. Waists were taken. Completion angiogram showed this now to be patent with two-vessel runoff to the foot, and I elected to terminate the procedure.  Imaging through the left femoral sheath was performed. We were below the bypass. A StarClose closure device was deployed in the usual fashion with excellent hemostatic result. The patient tolerated the procedure well and was taken to the recovery room in stable condition.    ____________________________ Algernon Huxley, MD jsd:bjt D: 11/13/2011 16:53:56 ET T: 11/14/2011 10:38:24 ET JOB#: [id number]  cc: Algernon Huxley, MD, <Dictator> Willene Hatchet, FNP Algernon Huxley MD ELECTRONICALLY SIGNED 11/14/2011 13:57

## 2014-11-06 NOTE — Consult Note (Signed)
PATIENT NAME:  Tasha Reyes, Tasha Reyes MR#:  [id number] DATE OF BIRTH:  1930/12/07  DATE OF CONSULTATION:  11/14/2011  REFERRING PHYSICIAN:  Mena Pauls, MD  CONSULTING PHYSICIAN:  A. Lavone Orn, MD  CHIEF COMPLAINT: Thyroid nodules.   HISTORY OF PRESENT ILLNESS: This is a 81 year old female admitted on 11/12/2011 for right leg pain and concern about urinary tract infection. She has a history of peripheral vascular disease and chronic obstructive pulmonary disease. She underwent angioplasty of the right lower extremity yesterday due to acute ischemia of the right leg. As part of her preoperative work-up she underwent carotid ultrasound which was notable for nodules within the thyroid gland. This was followed by a thyroid ultrasound yesterday and that report was reviewed. It showed that the right lobe of the thyroid measures 3.9 x 1.3 x 0.96 cm and the left lobe measures 5.1 x 2.3 x 2.9 cm. Isthmus measurement is not provided. Within the right lobe were reported "three tiny colloid cysts with the largest measuring 3.3 mm in diameter" and a 9 mm solid right lower pole hypoechoic nodule. Within the left lobe were three solid hypoechoic nodules. An upper lobe nodule measured 1.68 cm, a mid pole nodule measured 1.18 cm, and a lower pole nodule measured 2.1 cm. No calcifications were seen. The patient has not had a prior thyroid ultrasound. She has no known history of thyroid disease. She denies a family history of thyroid disease. She had thyroid function tests performed and her TSH was mildly low at 0.43 uIU/mL (reference range 0.45-4.5) with a normal FT4 of 0.92 ng/DL. She denies any heat intolerance. She denies tremor. She denies palpitations. She reports she has had some weight loss in the last few months, not known how much, which she attributes to decreased intake which she attributes to decreased intake. 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   PAST MEDICAL HISTORY:  1. Peripheral vascular disease.  2. Chronic obstructive pulmonary disease.  3. Hypertension.  4. Hyperlipidemia.   PAST SURGICAL HISTORY:  1. AAA repair, 2008.  2. Fem-pop bypass right lower extremity, 2008.  3. History of spine surgery.  4. Partial hysterectomy.   ALLERGIES: No known drug allergies.   SOCIAL HISTORY: The patient lives with her husband, who has pancreatic cancer. She does not smoke cigarettes, she quit more than 10 years ago. She denies alcohol use.   FAMILY HISTORY: No known thyroid disease.   CURRENT INPATIENT MEDICATIONS:  1. Vitamin C 2000 mg daily.  2. Tenormin 25 mg daily.  3. Lipitor 10 mg daily.  4. Levaquin 250 mg daily.  5. Plavix 75 mg daily.  6. IV heparin.  7. P.r.n. Tylenol.  8. P.r.n. Norco.  9. P.r.n. morphine.   REVIEW OF SYSTEMS: HEENT: Denies blurred vision. Denies headache. NECK: Denies neck pain. Denies dysphasia. CARDIAC: Denies chest pain or palpitation. PULMONARY: Denies cough. She has mild shortness of breath on exertion, no shortness of breath at rest. ABDOMEN: Denies abdominal pain. Reports fair appetite. No recent change in bowel habits. EXTREMITIES: No leg pain currently at rest. Denies leg swelling. SKIN: Denies rash or recent skin changes. Denies pruritus. ENDOCRINE: Denies heat or cold intolerance. HEME: Denies easy bruisability or recent bleeding.   PHYSICAL EXAMINATION:  VITAL SIGNS: Temperature 98.9, pulse 80, respirations 20, blood pressure 109/71, pulse oximetry 95% on room air.   GENERAL: Well-developed, well-nourished white female in no distress.  HEENT: Extraocular movements are intact. No proptosis. No lid lag or stare. Oropharynx is clear. Mucous membranes moist.   NECK: Supple. Thyroid is not enlarged. There is a palpable 2 cm left-sided thyroid nodule which is firm, nontender. No neck tenderness elicited.   LYMPH: No submandibular or anterior cervical lymphadenopathy.    CARDIAC: Regular rate and rhythm without murmur.   PULMONARY: Clear, bilaterally decreased breath sounds throughout.   ABDOMEN: Diffusely soft, nontender, nondistended.   EXTREMITIES: No edema is present.   SKIN: No appreciable rash or dermatopathy.   PSYCHIATRIC: Alert and oriented x3.   LABORATORY, DIAGNOSTIC, AND RADIOLOGICAL DATA: Glucose 118, BUN 8, creatinine 0.95, sodium 143, potassium 4, chloride 114, CO2 8, eGFR 57. Thyroid labs as per history of present illness.   ASSESSMENT: This is a 81 year old female with finding of incidental thyroid nodules. A dominant nodule is located in the left lobe in the lower pole and measures 2.1 cm in maximum diameter. A second issue is a mildly low TSH with a normal free T4 level. This is suggesting of subclinical hyperthyroidism versus euthyroid sick syndrome. Subclinical hyperthyroidism could be explain by a toxic multinodular goiter.  PLAN: We discussed thyroid nodules. She was advised that the vast majority of thyroid nodules are benign. However, in general, a biopsy is warranted for nodules greater than 1 cm in size. I gave her the option of scheduling outpatient biopsy of the nodules or perhaps a serial ultrasound to assess for growth in six months. She was not ready to make a decision today and thus,  I will schedule her for follow up in clinic in a few weeks and we can discuss her options again at that time. At minimum, she should have a repeat ultrasound in six months to assess for growth or change.   Thank you for the kind request for consultation.    ____________________________ A. Lavone Orn, MD ams:ap D: 11/14/2011 09:38:29 ET            T: 11/14/2011 11:35:49 ET            JOB#: [id number] cc: A. Lavone Orn, MD, <Dictator>  Domenick Gong, M.D.  Gracy Bruins Charlen Bakula MD ELECTRONICALLY SIGNED 11/20/2011 16:46

## 2014-11-06 NOTE — Consult Note (Signed)
Chief Complaint and History:   Referring Physician Dr. Lyndel Safe    Chief Complaint thyroid nodules   Allergies:  No Known Allergies:   Assessment/Plan:   Assessment/Plan This is a 81 year old female with a h/o HTN, HLP, COPD, and PVD admitted with rt leg pain and UTI symptoms. Found to have rt leg ischemia s/p angioplasty of rt leg. She had incidental finding of thyroid nodules on cartoid [place] from 5/1. Report was reviewed. There are 3 nodules in lt lobe >1 cm in size. She has no prior h/o thyroid disease. TSH slightly low at 0.43 uIU/ml with a normal free T4.  A/ 1. Multinodular goiter 2. Mildly low TSH (may have toxic MNG)  P/ Will schedule a recheck of thyroid labs and then follow-up in 2-3 weeks in clinic. If she has a repeat low TSH, then a thyroid uptake/scan may be warranted to differentiate between hot and cold nodules. If TSH is normal, then we can consider a biopsy of the larger nodules.   Full consult has been dictated.    Case Discussed With patient   Electronic Signatures: Judi Cong (MD)  (Signed [phone] 09:43)  Authored: Chief Complaint and History, ALLERGIES, Assessment/Plan   Last Updated: 02-May-13 09:43 by Judi Cong (MD)

## 2014-11-06 NOTE — Discharge Summary (Signed)
PATIENT NAME:  Tasha Reyes, Tasha Reyes MR#:  [id number] DATE OF BIRTH:  May 31, 1931  DATE OF ADMISSION:  11/12/2011 DATE OF DISCHARGE:  11/16/2011  DIAGNOSES:  1. Acute right lower extremity arterial occlusion with severe PVD status post thrombectomy. 2. Urinary tract infection. 3. Hypertension. 4. Hyperlipidemia. 5. Thyroid enlargement with nodules on ultrasound.  6. History of AAA, status post repair. 7. History of peptic ulcer disease. 8. Chronic obstructive pulmonary disease. 9. Anemia.   DISPOSITION: The patient is being discharged home.   FOLLOW-UP: Follow-up with Dr. Gabriel Carina, Dr. Lucky Cowboy, and Dr. Domenick Gong in 1 to 2 weeks after discharge.   DIET: Low sodium.   ACTIVITY: As tolerated.   OXYGEN: The patient was advised home oxygen, home health and PT, however, she refused.   DISCHARGE MEDICATIONS:  1. Levaquin 250 mg daily for five days.  2. Plavix 75 mg daily.  3. Vitamin C 2000 mg daily.  4. Omeprazole 40 mg daily.  5. Combivent 2 puffs q.6 hours p.r.n.  6. Atenolol 25 mg daily.  7. Lipitor 10 mg daily.    CONSULTATIONS:  1. Vascular Surgery consultation with Dr. Lucky Cowboy  2. Endocrinology consultation with Dr. Gabriel Carina   LABORATORY, DIAGNOSTIC, AND RADIOLOGICAL DATA: CT chest for PE showed no evidence of any PE, emphysema, soft tissue swelling in the superior mediastinum which appears to be related to thyroid gland.   CT angio stent graft is patent with AAA. No evidence of leakage. Occluded right common iliac artery aneurysm. The right common iliac artery is occluded. High-grade stenosis right renal artery. Stenosis left common femoral artery. High-grade stenosis right superficial femoral artery.   Thyroid ultrasound the left lobe is larger than the right. There are three solid nodules in the left lobe with the largest being at the lower pole and measuring 2.1 cm. At the lower pole of the right lobe there is a 9 mm hypoechoic solid nodule. In addition, there are three tiny anechoic  nodules compatible with tiny colloid cysts. No thyroid calcifications seen. Free T3 2.2 normal range. Urine culture grew 20,000 CFU Pseudomonas. Repeat culture no growth so far. Normal renal function. Potassium 3.4, supplemented. VLDL 33, LDL 37, cholesterol 164, HDL 29. TSH 0.431. Free T4 normal. Hemoglobin 11.7 to 9.7.   HOSPITAL COURSE: The patient is a 81 year old female with past medical history of PVD, hypertension, hyperlipidemia, AAA status post repair, and COPD who presented with right lower extremity foot pain. She was found to have acute arterial occlusion. She was started on a heparin drip. A Vascular Surgery consultation was obtained and the patient underwent thrombectomy. After thrombectomy her heparin drip was discontinued and she has been started on Plavix. She will need to follow-up with Vascular Surgery as an outpatient. The patient's hemoglobin was 11 on admission and it dropped down to 7.8. She received 1 unit of blood and her hemoglobin has come up to 9.7. The patient had a history of peptic ulcer disease and was still taking [employer]. She was advised to stop taking all NSAIDs and she has been started on a PPI. She was found to have a Pseudomonas urinary tract infection which was treated with Levaquin. Her hypertension remained controlled on atenolol. Her LDL is at goal on her current dose of Lipitor. CAT scan of the chest revealed that the patient also had thyroid enlargement with thyroid nodules. Her TSH was low but her T3/T4 were normal. She was evaluated by Dr. Gabriel Carina in the hospital and is to follow-up with Endocrinology as  an outpatient. The patient was mildly hypoxic on room, therefore, she was advised home oxygen therapy which the patient refused. She was also advised home health and physical therapy which the patient refused. The patient was discharged home in a stable condition.    TIME SPENT: 45 minutes.   ____________________________ Cherre Huger,  MD sp:drc D: 11/17/2011 13:35:43 ET T: 11/17/2011 14:21:53 ET JOB#: [id number]  cc: Cherre Huger, MD, <Dictator> A. Lavone Orn, MD Algernon Huxley, MD Fonnie Jarvis Ilene Qua, MD Cherre Huger MD ELECTRONICALLY SIGNED 11/18/2011 13:17

## 2014-11-06 NOTE — Op Note (Signed)
PATIENT NAME:  Tasha Reyes, Tasha Reyes MR#:  [id number] DATE OF BIRTH:  [birth year]  DATE OF PROCEDURE:  11/13/2011  PREOPERATIVE DIAGNOSES:  1. Acute ischemia of right lower extremity with rest pain.  2. Occluded femoral to femoral bypass.  3. History of abdominal aortic aneurysm status post aortouniiliac stent graft with femoral to femoral bypass.  4. Hypertension.   POSTOPERATIVE DIAGNOSES:  1. Acute ischemia of right lower extremity with rest pain.  2. Occluded femoral to femoral bypass.  3. History of abdominal aortic aneurysm status post aortouniiliac stent graft with femoral to femoral bypass.  4. Hypertension.   PROCEDURES PERFORMED: 1. Ultrasound guidance for vascular access, left femoral artery.  2. Catheter placement into right superficial femoral artery from left femoral approach.  3. Iliofemoral arteriogram bilaterally.  4. Catheter-directed thrombolysis with 4 mg of TPA directed through the AngioJet AVX catheter and power pulse spray to the bypass graft in the common femoral and superficial femoral arteries.  5. Mechanical rheolytic thrombectomy to same vessels.  6. Percutaneous transluminal angioplasty with a 7 mm diameter angioplasty balloon to proximal portion of the femorofemoral bypass graft (left).  7. Percutaneous transluminal angioplasty of right femoral anastomosis and proximal right superficial femoral artery with 7 mm diameter angioplasty balloon.  8. Placement of thrombolysis catheter for ongoing infusion of thrombolysis.   SURGEON: Algernon Huxley, M.D.   ANESTHESIA: Local with moderate conscious sedation.   ESTIMATED BLOOD LOSS: 25 mL.   INDICATION FOR PROCEDURE: This is a 81 year old white female who was admitted to the hospital with a litany of issues. She has several day history of right leg pain and inability to bear weight or walk. She was found to have occlusion on her left to right femoral to femoral bypass graft. She is brought down for an angiogram for  further evaluation for hopes of revascularization. The risks and benefits were discussed and informed consent was obtained.   DESCRIPTION OF PROCEDURE: The patient was brought to the vascular interventional radiology suite, groins were shaved and prepped, and a sterile surgical field was created. From her previous imaging it was found that her bypass graft was originally over the proximal and common femoral artery on the left and there were several centimeters of the common femoral artery below. I was to access just above the femoral bifurcation and place a 5 [place] sheath. With the help of a Kumpe catheter and stiff angled Glidewire and imaging through this sheath, the left iliac system was found to be patent. There was a short stump of the graft which was patent and occluded. I was able to access the graft with a Kumpe catheter and stiff angled Glidewire and advanced through the occluded graft and confirm intraluminal flow in the right superficial femoral artery below the occlusion. A Magic torque wire was then placed. Catheter-directed thrombolysis with 4 mg of TPA was instilled in a power pulse spray fashion from the origin of the left to right femoral to femoral bypass graft into the right superficial femoral artery.  This was performed and this was allowed to dwell for 20 minutes. Mechanical rheolytic thrombectomy was then performed over the same locations. Following this there was an area of stenosis with residual thrombosis at the bend in the proximal portion of the graft and at the right femoral anastomosis. These were both treated with a 7 mm diameter angioplasty balloon, but there was still some residual thrombosis remaining, and I elected to leave a lysis catheter for continuous indwelling infusion of TPA  to lyse the remaining thrombus. A 90 cm total length x 20 cm working length thrombolysis catheter was placed. Heparin will be infused through the 6 French sheath in the groin and she will be brought  back later in the day for second look angiography.  ____________________________ Algernon Huxley, MD jsd:slb D: 11/13/2011 15:55:30 ET     T: 11/13/2011 16:48:49 ET         JOB#: [id number] cc: Algernon Huxley, MD, <Dictator> Algernon Huxley MD ELECTRONICALLY SIGNED 11/14/2011 13:56

## 2014-11-06 NOTE — Consult Note (Signed)
Patient admitted with multiple ongoing issues, but has 1-2 weeks of right foot pain and weakness.  Foot is cooler but not cold.  Has no palpable pulses on right, present but decreased on the left.  CTA reviewed.  Had AUI with fem-fem in 2008, and the fem-fem is occluded.  Will see if this can be reopened, if not may need ax-fem.  If no good options for revascularization, amputation is possible.  Electronic Signatures: Algernon Huxley (MD)  (Signed on 01-May-13 09:51)  Authored  Last Updated: 01-May-13 09:51 by Algernon Huxley (MD)

## 2014-11-14 DIAGNOSIS — M4727 Other spondylosis with radiculopathy, lumbosacral region: Secondary | ICD-10-CM | POA: Diagnosis not present

## 2014-11-14 DIAGNOSIS — M5416 Radiculopathy, lumbar region: Secondary | ICD-10-CM | POA: Diagnosis not present

## 2014-11-14 DIAGNOSIS — Z79891 Long term (current) use of opiate analgesic: Secondary | ICD-10-CM | POA: Diagnosis not present

## 2014-11-14 DIAGNOSIS — G8929 Other chronic pain: Secondary | ICD-10-CM | POA: Diagnosis not present

## 2014-11-14 DIAGNOSIS — M545 Low back pain: Secondary | ICD-10-CM | POA: Diagnosis not present

## 2014-12-23 ENCOUNTER — Encounter: Payer: Self-pay | Admitting: Internal Medicine

## 2014-12-23 ENCOUNTER — Other Ambulatory Visit: Payer: Self-pay | Admitting: Internal Medicine

## 2014-12-23 DIAGNOSIS — G44221 Chronic tension-type headache, intractable: Secondary | ICD-10-CM | POA: Insufficient documentation

## 2014-12-23 DIAGNOSIS — G2581 Restless legs syndrome: Secondary | ICD-10-CM | POA: Insufficient documentation

## 2014-12-23 DIAGNOSIS — D369 Benign neoplasm, unspecified site: Secondary | ICD-10-CM | POA: Insufficient documentation

## 2014-12-23 DIAGNOSIS — Z862 Personal history of diseases of the blood and blood-forming organs and certain disorders involving the immune mechanism: Secondary | ICD-10-CM | POA: Insufficient documentation

## 2014-12-23 DIAGNOSIS — J449 Chronic obstructive pulmonary disease, unspecified: Secondary | ICD-10-CM | POA: Insufficient documentation

## 2014-12-23 DIAGNOSIS — Z8711 Personal history of peptic ulcer disease: Secondary | ICD-10-CM | POA: Insufficient documentation

## 2014-12-23 DIAGNOSIS — I999 Unspecified disorder of circulatory system: Secondary | ICD-10-CM | POA: Insufficient documentation

## 2014-12-23 DIAGNOSIS — E785 Hyperlipidemia, unspecified: Secondary | ICD-10-CM | POA: Insufficient documentation

## 2014-12-23 DIAGNOSIS — F17200 Nicotine dependence, unspecified, uncomplicated: Secondary | ICD-10-CM | POA: Insufficient documentation

## 2014-12-23 DIAGNOSIS — F325 Major depressive disorder, single episode, in full remission: Secondary | ICD-10-CM | POA: Insufficient documentation

## 2014-12-23 DIAGNOSIS — I10 Essential (primary) hypertension: Secondary | ICD-10-CM | POA: Insufficient documentation

## 2014-12-23 MED ORDER — PANTOPRAZOLE SODIUM 40 MG PO TBEC: 40.0000 mg | DELAYED_RELEASE_TABLET | Freq: Every day | ORAL | Status: DC

## 2014-12-28 ENCOUNTER — Ambulatory Visit
Admission: RE | Admit: 2014-12-28 | Discharge: 2014-12-28 | Disposition: A | Discharge status: Home / Self Care | Payer: Medicare Other | Source: Ambulatory Visit | Attending: Internal Medicine | Admitting: Internal Medicine

## 2014-12-28 ENCOUNTER — Other Ambulatory Visit: Payer: Self-pay | Admitting: Internal Medicine

## 2014-12-28 ENCOUNTER — Encounter: Payer: Self-pay | Admitting: Internal Medicine

## 2014-12-28 ENCOUNTER — Ambulatory Visit (INDEPENDENT_AMBULATORY_CARE_PROVIDER_SITE_OTHER): Payer: Medicare Other | Admitting: Internal Medicine

## 2014-12-28 ENCOUNTER — Encounter (INDEPENDENT_AMBULATORY_CARE_PROVIDER_SITE_OTHER): Payer: Self-pay

## 2014-12-28 VITALS — BP 141/76 | HR 80 | Ht 64.0 in | Wt 113.6 lb

## 2014-12-28 DIAGNOSIS — M25532 Pain in left wrist: Secondary | ICD-10-CM

## 2014-12-28 DIAGNOSIS — F17210 Nicotine dependence, cigarettes, uncomplicated: Secondary | ICD-10-CM | POA: Insufficient documentation

## 2014-12-28 DIAGNOSIS — R079 Chest pain, unspecified: Secondary | ICD-10-CM | POA: Diagnosis not present

## 2014-12-28 DIAGNOSIS — M25512 Pain in left shoulder: Secondary | ICD-10-CM | POA: Insufficient documentation

## 2014-12-28 DIAGNOSIS — F17200 Nicotine dependence, unspecified, uncomplicated: Secondary | ICD-10-CM | POA: Diagnosis not present

## 2014-12-28 DIAGNOSIS — F3341 Major depressive disorder, recurrent, in partial remission: Secondary | ICD-10-CM

## 2014-12-28 MED ORDER — MELOXICAM 7.5 MG PO TABS: 7.5000 mg | ORAL_TABLET | Freq: Every day | ORAL | Status: DC

## 2014-12-28 NOTE — Progress Notes (Signed)
Date:  12/28/2014   Name:  Tasha Reyes   DOB:  [birth year]   MRN:  [id number]   History of Present Illness:  This is a 81 y.o. female who is presenting with arm pain. Arm Pain  There was no injury mechanism. The pain is present in the left wrist and left shoulder. The quality of the pain is described as aching. The pain radiates to the left neck. The pain is mild. The pain has been fluctuating (worse in the AM - better with movement) since the incident. Associated symptoms include numbness (tingling in thumb and index finger). Pertinent negatives include no chest pain or tingling. Treatments tried: hydrocodone. The treatment provided mild relief.     Review of Systems:  Review of Systems  Constitutional: Negative for fever, appetite change and unexpected weight change.  Respiratory: Positive for cough and shortness of breath. Negative for wheezing.   Cardiovascular: Negative for chest pain and leg swelling.  Musculoskeletal: Positive for joint swelling and arthralgias.  Neurological: Positive for numbness (tingling in thumb and index finger). Negative for tingling and weakness (but there is pain and stiffness in the AM).    Patient Active Problem List   Diagnosis Date Noted  . Dyslipidemia 12/23/2014  . Hx of iron deficiency anemia 12/23/2014  . Compulsive tobacco user syndrome 12/23/2014  . Benign neoplasm 12/23/2014  . Chronic peptic ulcer with bleeding 12/23/2014  . Essential (primary) hypertension 12/23/2014  . Depression, major, recurrent, in partial remission 12/23/2014  . Alveolar emphysema of lung 12/23/2014  . Vascular disorder of lower extremity 12/23/2014  . Restless leg 12/23/2014  . Chronic tension-type headache, intractable 12/23/2014    Prior to Admission medications   Medication Sig Start Date End Date Taking? Authorizing Provider  atenolol (TENORMIN) 25 MG tablet Take 1 tablet by mouth daily. 09/21/14  Yes Historical Provider, MD  atorvastatin (LIPITOR) 10  MG tablet Take 1 tablet by mouth at bedtime. 07/25/14  Yes Historical Provider, MD  clopidogrel (PLAVIX) 75 MG tablet Take 1 tablet by mouth daily.   Yes Historical Provider, MD  ferrous sulfate 325 (65 FE) MG EC tablet Take 1 tablet by mouth daily. 04/15/14  Yes Historical Provider, MD  HYDROcodone-acetaminophen (NORCO/VICODIN) 5-325 MG per tablet Take 1 tablet by mouth 3 (three) times daily as needed. 12/15/14  Yes Historical Provider, MD  methocarbamol (ROBAXIN) 750 MG tablet Take 1 tablet by mouth 3 (three) times daily as needed. 10/30/14  Yes Historical Provider, MD  mirtazapine (REMERON SOL-TAB) 15 MG disintegrating tablet Take 1 tablet by mouth at bedtime. 08/24/14  Yes Historical Provider, MD  pantoprazole (PROTONIX) 40 MG tablet Take 1 tablet (40 mg total) by mouth daily. 12/23/14  Yes Glean Hess, MD  sertraline (ZOLOFT) 50 MG tablet Take 1.5 tablets by mouth at bedtime. 07/13/14  Yes Historical Provider, MD  MAGNESIUM-OXIDE 400 (241.3 MG) MG tablet Take 1 tablet by mouth daily. 10/30/14   Historical Provider, MD  pramipexole (MIRAPEX) 0.5 MG tablet Take 1 tablet by mouth at bedtime. 04/15/14   Historical Provider, MD    No Known Allergies  Past Surgical History  Procedure Laterality Date  . Partial hysterectomy    . Abdominal aortic aneurysm repair w/ endoluminal graft  2008  . Femoral-popliteal bypass graft Right 2008  . Back surgery    . Colonoscopy  2012  . Esophagogastroduodenoscopy  04/2013    gastric ulcers    History  Substance Use Topics  . Smoking status: Current Every Day Smoker  .  Smokeless tobacco: Not on file     Comment: patient not ready  . Alcohol Use: No     Medication list has been reviewed and updated.  Physical Examination:  Physical Exam  Constitutional: She appears well-developed. No distress.  Neck: Normal range of motion. Neck supple. No thyromegaly present.  Cardiovascular: Normal rate, regular rhythm and normal heart sounds.    Pulmonary/Chest: Effort normal. She has decreased breath sounds. She has no wheezes. She has no rhonchi.  Musculoskeletal:       Left shoulder: She exhibits decreased range of motion. She exhibits no bony tenderness and no effusion.       Left elbow: Normal.       Left wrist: She exhibits tenderness and swelling. She exhibits no crepitus and no deformity.  Lymphadenopathy:    She has no cervical adenopathy.    She has no axillary adenopathy.       Right: No supraclavicular adenopathy present.       Left: No supraclavicular adenopathy present.  Neurological: She has normal strength and normal reflexes.  Skin: She is not diaphoretic.  Psychiatric: Her speech is normal and behavior is normal. Thought content normal. Her affect is blunt.  Nursing note and vitals reviewed.   BP 168/78 mmHg  Pulse 80  Ht '5\' 4"'$  (1.626 m)  Wt 113 lb 9.6 oz (51.529 kg)  BMI 19.49 kg/m2  Assessment and Plan: 1. Left wrist pain - DG Wrist Complete Left; Future - meloxicam (MOBIC) 7.5 MG tablet; Take 1 tablet (7.5 mg total) by mouth daily.  Dispense: 30 tablet; Refill: 0 May need Ortho referral 2. Compulsive tobacco user syndrome Rule out pulmonary neoplasm as cause of left arm symptoms - DG Chest 2 View; Future 3. Depression Continue on zoloft and mirtazepine If symptoms persist, will discuss change in therapy  Halina Maidens, MD [facility]  12/28/2014

## 2015-01-11 ENCOUNTER — Other Ambulatory Visit: Payer: Self-pay | Admitting: Internal Medicine

## 2015-01-18 ENCOUNTER — Other Ambulatory Visit: Payer: Self-pay | Admitting: Internal Medicine

## 2015-01-24 ENCOUNTER — Other Ambulatory Visit: Payer: Self-pay | Admitting: Internal Medicine

## 2015-02-15 ENCOUNTER — Other Ambulatory Visit: Payer: Self-pay | Admitting: Internal Medicine

## 2015-03-01 ENCOUNTER — Other Ambulatory Visit: Payer: Self-pay | Admitting: Internal Medicine

## 2015-03-20 ENCOUNTER — Other Ambulatory Visit: Payer: Self-pay | Admitting: Internal Medicine

## 2015-03-31 ENCOUNTER — Other Ambulatory Visit: Payer: Self-pay | Admitting: Internal Medicine

## 2015-04-19 ENCOUNTER — Other Ambulatory Visit: Payer: Self-pay | Admitting: Internal Medicine

## 2015-05-03 ENCOUNTER — Encounter: Payer: Medicare Other | Admitting: Pain Medicine

## 2015-05-09 DIAGNOSIS — I739 Peripheral vascular disease, unspecified: Secondary | ICD-10-CM | POA: Diagnosis not present

## 2015-05-09 DIAGNOSIS — F17200 Nicotine dependence, unspecified, uncomplicated: Secondary | ICD-10-CM | POA: Diagnosis not present

## 2015-05-09 DIAGNOSIS — I70212 Atherosclerosis of native arteries of extremities with intermittent claudication, left leg: Secondary | ICD-10-CM | POA: Diagnosis not present

## 2015-05-09 DIAGNOSIS — I70213 Atherosclerosis of native arteries of extremities with intermittent claudication, bilateral legs: Secondary | ICD-10-CM | POA: Diagnosis not present

## 2015-05-09 DIAGNOSIS — I714 Abdominal aortic aneurysm, without rupture: Secondary | ICD-10-CM | POA: Diagnosis not present

## 2015-05-11 ENCOUNTER — Other Ambulatory Visit: Payer: Self-pay | Admitting: Pain Medicine

## 2015-05-11 ENCOUNTER — Encounter: Payer: Self-pay | Admitting: Pain Medicine

## 2015-05-11 ENCOUNTER — Ambulatory Visit: Payer: Medicare Other | Attending: Pain Medicine | Admitting: Pain Medicine

## 2015-05-11 VITALS — BP 141/60 | HR 62 | Temp 98.4°F | Resp 18 | Ht 65.0 in | Wt 113.0 lb

## 2015-05-11 DIAGNOSIS — I728 Aneurysm of other specified arteries: Secondary | ICD-10-CM | POA: Diagnosis not present

## 2015-05-11 DIAGNOSIS — M5417 Radiculopathy, lumbosacral region: Secondary | ICD-10-CM

## 2015-05-11 DIAGNOSIS — Z79899 Other long term (current) drug therapy: Secondary | ICD-10-CM

## 2015-05-11 DIAGNOSIS — G43909 Migraine, unspecified, not intractable, without status migrainosus: Secondary | ICD-10-CM | POA: Diagnosis not present

## 2015-05-11 DIAGNOSIS — F1190 Opioid use, unspecified, uncomplicated: Secondary | ICD-10-CM | POA: Insufficient documentation

## 2015-05-11 DIAGNOSIS — M47816 Spondylosis without myelopathy or radiculopathy, lumbar region: Secondary | ICD-10-CM | POA: Insufficient documentation

## 2015-05-11 DIAGNOSIS — Z8711 Personal history of peptic ulcer disease: Secondary | ICD-10-CM

## 2015-05-11 DIAGNOSIS — Z7902 Long term (current) use of antithrombotics/antiplatelets: Secondary | ICD-10-CM | POA: Insufficient documentation

## 2015-05-11 DIAGNOSIS — G8929 Other chronic pain: Secondary | ICD-10-CM | POA: Diagnosis not present

## 2015-05-11 DIAGNOSIS — Z79891 Long term (current) use of opiate analgesic: Secondary | ICD-10-CM | POA: Insufficient documentation

## 2015-05-11 DIAGNOSIS — M5410 Radiculopathy, site unspecified: Secondary | ICD-10-CM

## 2015-05-11 DIAGNOSIS — M5416 Radiculopathy, lumbar region: Secondary | ICD-10-CM

## 2015-05-11 DIAGNOSIS — Z9889 Other specified postprocedural states: Secondary | ICD-10-CM

## 2015-05-11 DIAGNOSIS — E7800 Pure hypercholesterolemia, unspecified: Secondary | ICD-10-CM | POA: Insufficient documentation

## 2015-05-11 DIAGNOSIS — J449 Chronic obstructive pulmonary disease, unspecified: Secondary | ICD-10-CM | POA: Diagnosis not present

## 2015-05-11 DIAGNOSIS — Z8679 Personal history of other diseases of the circulatory system: Secondary | ICD-10-CM | POA: Diagnosis not present

## 2015-05-11 DIAGNOSIS — M545 Low back pain, unspecified: Secondary | ICD-10-CM

## 2015-05-11 DIAGNOSIS — Z86718 Personal history of other venous thrombosis and embolism: Secondary | ICD-10-CM | POA: Insufficient documentation

## 2015-05-11 DIAGNOSIS — M47896 Other spondylosis, lumbar region: Secondary | ICD-10-CM | POA: Diagnosis not present

## 2015-05-11 DIAGNOSIS — I10 Essential (primary) hypertension: Secondary | ICD-10-CM | POA: Insufficient documentation

## 2015-05-11 DIAGNOSIS — Z9229 Personal history of other drug therapy: Secondary | ICD-10-CM | POA: Insufficient documentation

## 2015-05-11 DIAGNOSIS — Z7901 Long term (current) use of anticoagulants: Secondary | ICD-10-CM

## 2015-05-11 DIAGNOSIS — M79604 Pain in right leg: Secondary | ICD-10-CM | POA: Diagnosis not present

## 2015-05-11 DIAGNOSIS — M792 Neuralgia and neuritis, unspecified: Secondary | ICD-10-CM

## 2015-05-11 DIAGNOSIS — I739 Peripheral vascular disease, unspecified: Secondary | ICD-10-CM

## 2015-05-11 DIAGNOSIS — M5441 Lumbago with sciatica, right side: Secondary | ICD-10-CM

## 2015-05-11 DIAGNOSIS — M4726 Other spondylosis with radiculopathy, lumbar region: Secondary | ICD-10-CM

## 2015-05-11 DIAGNOSIS — M549 Dorsalgia, unspecified: Secondary | ICD-10-CM | POA: Diagnosis present

## 2015-05-11 DIAGNOSIS — I723 Aneurysm of iliac artery: Secondary | ICD-10-CM

## 2015-05-11 DIAGNOSIS — G894 Chronic pain syndrome: Secondary | ICD-10-CM

## 2015-05-11 HISTORY — DX: Aneurysm of iliac artery: I72.3

## 2015-05-11 HISTORY — DX: Personal history of peptic ulcer disease: Z87.11

## 2015-05-11 HISTORY — DX: Personal history of other venous thrombosis and embolism: Z86.718

## 2015-05-11 MED ORDER — HYDROCODONE-ACETAMINOPHEN 5-325 MG PO TABS: 1.0000 | ORAL_TABLET | Freq: Three times a day (TID) | ORAL | Status: DC | PRN

## 2015-05-11 NOTE — Progress Notes (Signed)
Patient's Name: Tasha Reyes MRN: [id number] DOB: [birth year] DOS: 05/11/2015  Primary Reason(s) for Visit: Encounter for Medication Management. CC: Back Pain   HPI:   Tasha Reyes is a 81 y.o. year old, female patient, who returns today as an established patient. She has Dyslipidemia; Hx of iron deficiency anemia; Compulsive tobacco user syndrome; Benign neoplasm; Chronic peptic ulcer with bleeding; Essential (primary) hypertension; Depression, major, recurrent, in partial remission ([place]); Alveolar emphysema of lung ([place]); Vascular disorder of lower extremity; Restless leg; Chronic tension-type headache, intractable; Chronic pain; Chronic low back pain; Chronic pain of right lower extremity; Lumbosacral Radiculopathy (Right); Chronic radicular lumbar pain (Right); Long term current use of opiate analgesic; Long term prescription opiate use; Opiate use; History of abdominal aortic aneurysm (AAA) repair; Peripheral vascular disease ([place]); Lumbar spondylosis; Hx of long term use of blood thinners (Plavix); Pain of right lower extremity; Chronic pain syndrome; Neurogenic pain; History of DVT (deep vein thrombosis); History of peptic ulcer disease; Facet hypertrophy of lumbar region; Facet syndrome, lumbar; and Aneurysm of iliac artery ([place]) on her problem list.. Her primarily concern today is the Back Pain   The patient returns today for follow-up evaluation. The patient indicates that she is doing rather well with her current medications. She is taking a total of 3 tablets of Norco per day. This is controlling her pain. The patient describes her primary pain as being that of the right lower extremity. The pain seems to be going all the way down into her foot. She has difficulty determining what area of the foot she's having more pain on. She describes this pain as a deep ache. Of note is the fact that in 2008 patient had an Court take abdominal aneurysm corrected with some bypass towards to the right  lower extremity. She indicates that her cardiologist have told her that some of the right leg pain may be secondary to poor blood flow. However, the patient denies any changes in color or temperature of the leg. In addition, the patient presented with bilateral low back pain with the right side also being worse than the left. This combination of the low back pain and leg pain would indicate that there are some problems in the lumbar region as well. She does admit that when she goes shopping she has to lean over the cart which is typical of patients with spinal stenosis. She is 81 years old and it is very possible that she has some degree of either central or foraminal stenosis. We will go ahead and proceed with a repeat MRI to evaluate the extent of its progress.  Pharmacotherapy Review: Side-effects or Adverse reactions: None reported. Effectiveness: Described as relatively effective, allowing for increase in activities of daily living (ADL). Onset of action: Within expected pharmacological parameters. Duration of action: Within normal limits for medication. Peak effect: Timing and results are as within normal expected parameters. [place] PMP: Compliant with practice rules and regulations. DST: Compliant with practice rules and regulations. Lab work: No new labs ordered by our practice. Treatment compliance: Compliant. Substance Use Disorder (SUD) Risk Level: Low Planned course of action: Continue therapy as is.  Allergies: Tasha Reyes has No Known Allergies.  Meds: The patient has a current medication list which includes the following prescription(s): atenolol, atorvastatin, clopidogrel, ferrous sulfate, hydrocodone-acetaminophen, magnesium-oxide, methocarbamol, mirtazapine, pantoprazole, pramipexole, promethazine, hydrocodone-acetaminophen, hydrocodone-acetaminophen, and hydrocodone-acetaminophen. Requested Prescriptions   Signed Prescriptions Disp Refills  . HYDROcodone-acetaminophen  (NORCO/VICODIN) 5-325 MG tablet 90 tablet 0    Sig: Take 1  tablet by mouth every 8 (eight) hours as needed for moderate pain.  Marland Kitchen HYDROcodone-acetaminophen (NORCO/VICODIN) 5-325 MG tablet 90 tablet 0    Sig: Take 1 tablet by mouth every 8 (eight) hours as needed for moderate pain.  Marland Kitchen HYDROcodone-acetaminophen (NORCO/VICODIN) 5-325 MG tablet 90 tablet 0    Sig: Take 1 tablet by mouth every 8 (eight) hours as needed for moderate pain.    ROS: Constitutional: Afebrile, no chills, well hydrated and well nourished Gastrointestinal: negative Musculoskeletal:negative Neurological: negative Behavioral/Psych: negative  PFSH: Medical:  Tasha Reyes  has a past medical history of [name]; Hypertension; COPD (chronic obstructive pulmonary disease) ([place]); Cystitis; Migraines; Stroke ([place]); [name]; Vascular disease; History of DVT (deep vein thrombosis) (05/11/2015); History of peptic ulcer disease (05/11/2015); and Aneurysm of iliac artery ([place]) (05/11/2015). Family: family history includes Brain cancer in her father; Kidney disease in her mother. Surgical:  has past surgical history that includes Partial hysterectomy; [name]; [name], 2008); Back surgery; Colonoscopy (2012); Esophagogastroduodenoscopy (04/2013); Abdominal hysterectomy; Appendectomy; and blood clot . Tobacco:  reports that she has been smoking.  She does not have any smokeless tobacco history on file. Alcohol:  reports that she does not drink alcohol. Drug:  has no drug history on file.  Physical Exam: Vitals:  Today's Vitals   05/11/15 1034 05/11/15 1037  BP: 141/60   Pulse: 62   Temp: 98.4 F (36.9 C)   TempSrc: Oral   Resp: 18   Height: '5\' 5"'$  (1.651 m)   Weight: 113 lb (51.256 kg)   SpO2: 93%   PainSc:  2   Calculated BMI: Body mass index is 18.8 kg/(m^2). General appearance: alert, cooperative, appears  stated age and no distress Eyes: conjunctivae/corneas clear. PERRL, EOM's intact. Fundi benign. Lungs: No evidence respiratory distress, no audible rales or ronchi and no use of accessory muscles of respiration Neck: no adenopathy, no carotid bruit, no JVD, supple, symmetrical, trachea midline and thyroid not enlarged, symmetric, no tenderness/mass/nodules Back: symmetric, no curvature. ROM normal. No CVA tenderness. Extremities: extremities normal, atraumatic, no cyanosis or edema Pulses: 2+ and symmetric Skin: Skin color, texture, turgor normal. No rashes or lesions Neurologic: Grossly normal    Assessment: Encounter Diagnosis:  Primary Diagnosis: Chronic pain [G89.29]  Plan: Orena was seen today for back pain.  Diagnoses and all orders for this visit:  Chronic pain -     HYDROcodone-acetaminophen (NORCO/VICODIN) 5-325 MG tablet; Take 1 tablet by mouth every 8 (eight) hours as needed for moderate pain. -     HYDROcodone-acetaminophen (NORCO/VICODIN) 5-325 MG tablet; Take 1 tablet by mouth every 8 (eight) hours as needed for moderate pain. -     HYDROcodone-acetaminophen (NORCO/VICODIN) 5-325 MG tablet; Take 1 tablet by mouth every 8 (eight) hours as needed for moderate pain.  Chronic low back pain  Chronic pain of right lower extremity  Lumbosacral radiculopathy -     MR Lumbar Spine Wo Contrast; Future  Chronic radicular lumbar pain (Right)  Long term current use of opiate analgesic  Long term prescription opiate use  Opiate use  History of abdominal aortic aneurysm (AAA) repair  Peripheral vascular disease ([facility])  Osteoarthritis of spine with radiculopathy, lumbar region  Hx of long term use of blood thinners (Plavix)  Pain of right lower extremity  Chronic pain syndrome  Neurogenic pain  History of DVT (deep vein thrombosis)  History of peptic ulcer disease  Facet hypertrophy of lumbar region  Facet syndrome, lumbar  Aneurysm of iliac artery  ([facility])     There are no Patient Instructions on file for this visit. Medications discontinued today:  Medications Discontinued During This Encounter  Medication Reason  . meloxicam (MOBIC) 7.5 MG tablet Error  . sertraline (ZOLOFT) 50 MG tablet Error   Medications administered today:  Ms. Kapfer had no medications administered during this visit.  Primary Care Physician: Halina Maidens, MD Location: [facility] Note by: Paulette Rockford A. Dossie Arbour, M.D, DABA, DABAPM, DABPM, DABIPP, FIPP

## 2015-05-11 NOTE — Progress Notes (Signed)
Pt did not bring meds today - instructed to bring for appt. States she has 2 pills left

## 2015-05-20 LAB — TOXASSURE SELECT 13 (MW), URINE: PDF: 0

## 2015-05-22 ENCOUNTER — Other Ambulatory Visit: Payer: Self-pay | Admitting: Internal Medicine

## 2015-05-22 MED ORDER — FERROUS SULFATE 325 (65 FE) MG PO TBEC: 1.0000 | DELAYED_RELEASE_TABLET | Freq: Every day | ORAL | Status: DC

## 2015-05-26 ENCOUNTER — Ambulatory Visit
Admission: RE | Admit: 2015-05-26 | Discharge: 2015-05-26 | Disposition: A | Discharge status: Home / Self Care | Payer: Medicare Other | Source: Ambulatory Visit | Attending: Pain Medicine | Admitting: Pain Medicine

## 2015-05-26 DIAGNOSIS — M4806 Spinal stenosis, lumbar region: Secondary | ICD-10-CM | POA: Insufficient documentation

## 2015-05-26 DIAGNOSIS — M5136 Other intervertebral disc degeneration, lumbar region: Secondary | ICD-10-CM | POA: Diagnosis not present

## 2015-05-26 DIAGNOSIS — M545 Low back pain: Secondary | ICD-10-CM | POA: Diagnosis present

## 2015-05-26 DIAGNOSIS — M79604 Pain in right leg: Secondary | ICD-10-CM | POA: Diagnosis present

## 2015-05-26 DIAGNOSIS — M5417 Radiculopathy, lumbosacral region: Secondary | ICD-10-CM

## 2015-05-26 DIAGNOSIS — M5126 Other intervertebral disc displacement, lumbar region: Secondary | ICD-10-CM | POA: Diagnosis not present

## 2015-06-05 ENCOUNTER — Other Ambulatory Visit: Payer: Self-pay | Admitting: Pain Medicine

## 2015-06-20 ENCOUNTER — Other Ambulatory Visit: Payer: Self-pay | Admitting: Pain Medicine

## 2015-06-20 DIAGNOSIS — M48061 Spinal stenosis, lumbar region without neurogenic claudication: Secondary | ICD-10-CM

## 2015-07-12 ENCOUNTER — Other Ambulatory Visit: Payer: Self-pay | Admitting: Internal Medicine

## 2015-07-25 ENCOUNTER — Other Ambulatory Visit: Payer: Self-pay

## 2015-07-25 MED ORDER — PANTOPRAZOLE SODIUM 40 MG PO TBEC: 40.0000 mg | DELAYED_RELEASE_TABLET | Freq: Two times a day (BID) | ORAL | Status: DC

## 2015-07-25 NOTE — Telephone Encounter (Signed)
Received fax from pharmacy requesting medication.

## 2015-07-26 NOTE — Telephone Encounter (Signed)
pts coming in on 3/31 for cpe

## 2015-08-09 ENCOUNTER — Ambulatory Visit: Payer: Medicare Other | Attending: Pain Medicine | Admitting: Pain Medicine

## 2015-08-09 ENCOUNTER — Encounter: Payer: Self-pay | Admitting: Pain Medicine

## 2015-08-09 ENCOUNTER — Other Ambulatory Visit: Payer: Self-pay | Admitting: Internal Medicine

## 2015-08-09 ENCOUNTER — Other Ambulatory Visit: Payer: Self-pay | Admitting: Pain Medicine

## 2015-08-09 VITALS — BP 160/73 | HR 67 | Temp 98.0°F | Resp 16 | Ht 64.0 in | Wt 113.0 lb

## 2015-08-09 DIAGNOSIS — D509 Iron deficiency anemia, unspecified: Secondary | ICD-10-CM | POA: Diagnosis not present

## 2015-08-09 DIAGNOSIS — J439 Emphysema, unspecified: Secondary | ICD-10-CM | POA: Diagnosis not present

## 2015-08-09 DIAGNOSIS — M5417 Radiculopathy, lumbosacral region: Secondary | ICD-10-CM | POA: Diagnosis not present

## 2015-08-09 DIAGNOSIS — F1190 Opioid use, unspecified, uncomplicated: Secondary | ICD-10-CM | POA: Diagnosis not present

## 2015-08-09 DIAGNOSIS — I10 Essential (primary) hypertension: Secondary | ICD-10-CM | POA: Insufficient documentation

## 2015-08-09 DIAGNOSIS — Z5181 Encounter for therapeutic drug level monitoring: Secondary | ICD-10-CM

## 2015-08-09 DIAGNOSIS — G44229 Chronic tension-type headache, not intractable: Secondary | ICD-10-CM | POA: Diagnosis not present

## 2015-08-09 DIAGNOSIS — Z7189 Other specified counseling: Secondary | ICD-10-CM

## 2015-08-09 DIAGNOSIS — M5416 Radiculopathy, lumbar region: Secondary | ICD-10-CM

## 2015-08-09 DIAGNOSIS — M4806 Spinal stenosis, lumbar region: Secondary | ICD-10-CM

## 2015-08-09 DIAGNOSIS — F329 Major depressive disorder, single episode, unspecified: Secondary | ICD-10-CM | POA: Insufficient documentation

## 2015-08-09 DIAGNOSIS — Z79891 Long term (current) use of opiate analgesic: Secondary | ICD-10-CM

## 2015-08-09 DIAGNOSIS — M25559 Pain in unspecified hip: Secondary | ICD-10-CM | POA: Diagnosis present

## 2015-08-09 DIAGNOSIS — E785 Hyperlipidemia, unspecified: Secondary | ICD-10-CM | POA: Insufficient documentation

## 2015-08-09 DIAGNOSIS — G8929 Other chronic pain: Secondary | ICD-10-CM

## 2015-08-09 DIAGNOSIS — Z9889 Other specified postprocedural states: Secondary | ICD-10-CM | POA: Insufficient documentation

## 2015-08-09 DIAGNOSIS — M48061 Spinal stenosis, lumbar region without neurogenic claudication: Secondary | ICD-10-CM

## 2015-08-09 MED ORDER — HYDROCODONE-ACETAMINOPHEN 5-325 MG PO TABS: 1.0000 | ORAL_TABLET | Freq: Three times a day (TID) | ORAL | Status: DC | PRN

## 2015-08-09 NOTE — Progress Notes (Signed)
Patient's Name: Tasha Reyes MRN: [id number] DOB: 14-Apr-1931 DOS: 08/09/2015  Primary Reason(s) for Visit: Encounter for Medication Management CC: Hip Pain   HPI  [name] is a 82 y.o. year old, female patient, who returns today as an established patient. She has Dyslipidemia; Hx of iron deficiency anemia; Compulsive tobacco user syndrome; Benign neoplasm; Chronic peptic ulcer with bleeding; Essential (primary) hypertension; Depression, major, recurrent, in partial remission ([place]); Alveolar emphysema of lung ([place]); Vascular disorder of lower extremity; Restless leg; Chronic tension-type headache, intractable; Chronic pain; Chronic low back pain (Location of Secondary source of pain) (Bilateral) (R>L); Chronic lower extremity pain (Location of Primary Source of Pain) (Right); Lumbosacral Radiculopathy (Right); Chronic radicular lumbar pain (Right); Long term current use of opiate analgesic; Long term prescription opiate use; Opiate use; History of abdominal aortic aneurysm (AAA) repair; Peripheral vascular disease ([place]); Lumbar spondylosis; Hx of long term use of blood thinners (Plavix); Lower extremity pain (Right); Chronic pain syndrome; Neurogenic pain; History of DVT (deep vein thrombosis); History of peptic ulcer disease; Lumbar facet hypertrophy; Facet syndrome, lumbar; Aneurysm of iliac artery ([place]); Lumbar foraminal stenosis (Right) (Severe at L4-5); Encounter for therapeutic drug level monitoring; and Encounter for chronic pain management on her problem list.. Her primarily concern today is the Hip Pain   The patient returns to the clinics today for pharmacological management of her chronic pain. In addition, today we have discussed in detail her MRI results. She has evidence of a severe right-sided L4-5 foraminal stenosis. We have recommended a neurosurgical consult with a late this. In addition, we have also offered the patient a right sided L4-5 transforaminal epidural steroid  injection under fluoroscopic guidance. She indicated being interested in this last option. We have provided him as a PRN procedure.  Today we spoke to the patient about her alternatives and we recommended a neurosurgical consult.  Reported Pain Score: 2  Reported level is inconsistent with clinical obrservations. Pain Type: Chronic pain Pain Location: Hip Pain Orientation: Lower Pain Descriptors / Indicators: Aching, Throbbing, Spasm Pain Frequency: Intermittent  Date of Last Visit: 05/11/15 Service Provided on Last Visit: Med Refill  Pharmacotherapy  Medication(s): Retreating this patient with Norco 5/325 1 tablet every 8 hours when necessary for the pain. Onset of action: Within expected pharmacological parameters Time to Peak effect: Timing and results are as within normal expected parameters Analgesic Effect: More than 50% Activity Facilitation: Medication(s) allow patient to sit, stand, walk, and do the basic ADLs Perceived Effectiveness: Described as relatively effective, allowing for increase in activities of daily living (ADL) Side-effects or Adverse reactions: None reported Duration of action: Within normal limits for medication [place] PMP: Compliant with practice rules and regulations UDS Results: The patient's last UDS was done on 05/11/2015 and found to be within normal limits for her opioids with no unexpected results except for Butalbital. UDS Interpretation: Patient appears to be compliant with practice rules and regulations Medication Assessment Form: Reviewed. Patient indicates being compliant with therapy Treatment compliance: Compliant Substance Use Disorder (SUD) Risk Level: Low Pharmacologic Plan: Continue therapy as is  Lab Work: Illicit Drugs No results found for: THCU, COCAINSCRNUR, PCPSCRNUR, MDMA, AMPHETMU, METHADONE, ETOH  Inflammation Markers No results found for: ESRSEDRATE, CRP  Renal Function Lab Results  Component Value Date   BUN 4* 02/27/2013    CREATININE 0.72 02/27/2013   GFRAA >60 02/27/2013   GFRNONAA >60 02/27/2013    Hepatic Function Lab Results  Component Value Date   AST 17 02/21/2013   ALT  15 02/21/2013   ALBUMIN 2.7* 02/21/2013    Electrolytes Lab Results  Component Value Date   NA 137 02/27/2013   K 3.6 02/27/2013   CL 105 02/27/2013   CALCIUM 8.0* 02/27/2013    Allergies  [name] has No Known Allergies.  Meds  The patient has a current medication list which includes the following prescription(s): atenolol, clopidogrel, ferrous sulfate, hydrocodone-acetaminophen, hydrocodone-acetaminophen, hydrocodone-acetaminophen, magnesium-oxide, methocarbamol, pantoprazole, promethazine, atorvastatin, and sertraline.  Current Outpatient Prescriptions on File Prior to Visit  Medication Sig  . atenolol (TENORMIN) 25 MG tablet TAKE 1 TABLET BY MOUTH DAILY  . clopidogrel (PLAVIX) 75 MG tablet Take 1 tablet by mouth daily.  . ferrous sulfate 325 (65 FE) MG EC tablet Take 1 tablet (325 mg total) by mouth daily.  Marland Kitchen MAGNESIUM-OXIDE 400 (241.3 MG) MG tablet Take 1 tablet by mouth daily.  . methocarbamol (ROBAXIN) 750 MG tablet Take 1 tablet by mouth 3 (three) times daily as needed.  . pantoprazole (PROTONIX) 40 MG tablet Take 1 tablet (40 mg total) by mouth 2 (two) times daily.  . promethazine (PHENERGAN) 12.5 MG tablet Take 12.5 mg by mouth every 6 (six) hours as needed for nausea or vomiting.   No current facility-administered medications on file prior to visit.    ROS  Constitutional: Afebrile, no chills, well hydrated and well nourished Gastrointestinal: negative Musculoskeletal:negative Neurological: negative Behavioral/Psych: negative  PFSH  Medical:  Ms. Nored  has a past medical history of [name]; Hypertension; COPD (chronic obstructive pulmonary disease) ([place]); Cystitis; Migraines; Stroke ([place]); [name]; Vascular disease; History of DVT (deep vein thrombosis)  (05/11/2015); History of peptic ulcer disease (05/11/2015); and Aneurysm of iliac artery ([place]) (05/11/2015). Family: family history includes Brain cancer in her father; Kidney disease in her mother. Surgical:  has past surgical history that includes Partial hysterectomy; [name]; [name], 2008); Back surgery; Colonoscopy (2012); Esophagogastroduodenoscopy (04/2013); Abdominal hysterectomy; Appendectomy; and blood clot . Tobacco:  reports that she has been smoking.  She does not have any smokeless tobacco history on file. Alcohol:  reports that she does not drink alcohol. Drug:  has no drug history on file.  Physical Exam  Vitals:  Today's Vitals   08/09/15 1021 08/09/15 1022  BP: 160/73   Pulse: 67   Temp: 98 F (36.7 C)   Resp: 16   Height: '5\' 4"'$  (1.626 m)   Weight: 113 lb (51.256 kg)   SpO2: 96%   PainSc: 2  2   PainLoc: Hip     Calculated BMI: Body mass index is 19.39 kg/(m^2).  General appearance: alert, cooperative, appears stated age and no distress Eyes: PERLA Respiratory: No evidence respiratory distress, no audible rales or ronchi and no use of accessory muscles of respiration  Lumbar Spine Inspection: No gross anomalies detected Alignment: Symetrical ROM: Decreased Palpation: Tender Gait: Antalgic (limping)  Lower Extremities Inspection: No gross anomalies detected ROM: Grossly normal Sensory: Normal Motor: Guarding  Toe walk (S1): WNL  Heal walk (L5): WNL Pulses: Palpable  Assessment & Plan  Primary Diagnosis & Pertinent Problem List: The primary encounter diagnosis was Chronic pain. Diagnoses of Long term current use of opiate analgesic, Encounter for therapeutic drug level monitoring, Encounter for chronic pain management, Lumbar foraminal stenosis (severe right L4-5), Lumbosacral Radiculopathy (Right), and Chronic radicular lumbar pain (Right) were also pertinent to this  visit.  Visit Diagnosis: 1. Chronic pain   2. Long term current use of opiate  analgesic   3. Encounter for therapeutic drug level monitoring   4. Encounter for chronic pain management   5. Lumbar foraminal stenosis (severe right L4-5)   6. Lumbosacral Radiculopathy (Right)   7. Chronic radicular lumbar pain (Right)     Assessment: No problem-specific assessment & plan notes found for this encounter.   Plan of Care  Pharmacotherapy (Medications Ordered): Meds ordered this encounter  Medications  . HYDROcodone-acetaminophen (NORCO/VICODIN) 5-325 MG tablet    Sig: Take 1 tablet by mouth every 8 (eight) hours as needed for moderate pain or severe pain.    Dispense:  90 tablet    Refill:  0    Do not place this medication, or any other prescription from our practice, on "Automatic Refill". Patient may have prescription filled one day early if pharmacy is closed on scheduled refill date. Do not fill until: 08/09/15 To last until: 09/08/15  . HYDROcodone-acetaminophen (NORCO/VICODIN) 5-325 MG tablet    Sig: Take 1 tablet by mouth every 8 (eight) hours as needed for moderate pain or severe pain.    Dispense:  90 tablet    Refill:  0    Do not place this medication, or any other prescription from our practice, on "Automatic Refill". Patient may have prescription filled one day early if pharmacy is closed on scheduled refill date. Do not fill until: 09/08/15 To last until: 10/05/15  . HYDROcodone-acetaminophen (NORCO/VICODIN) 5-325 MG tablet    Sig: Take 1 tablet by mouth every 8 (eight) hours as needed for moderate pain or severe pain.    Dispense:  90 tablet    Refill:  0    Do not place this medication, or any other prescription from our practice, on "Automatic Refill". Patient may have prescription filled one day early if pharmacy is closed on scheduled refill date. Do not fill until: 10/05/15 To last until: 11/04/15    [facility] & Procedure Ordered: Orders Placed This Encounter   Procedures  . EPIDURAL STEROID INJECTION    Standing Status: Standing     Number of Occurrences: 1     Standing Expiration Date: 08/08/2016    Scheduling Instructions:     Side: Right-sided     Sedation: Patient's choice     Timeframe: PRN Procedure. Patient will call to schedule.     Remember to stop Plavix before the procedure.  . Drugs of abuse screen w/o alc, rtn urine-sln    Volume: 10 ml(s). Minimum 3 ml of urine is needed. Document temperature of fresh sample. Indications: Long term (current) use of opiate analgesic (Z79.891) Test#: [id number] ([employer] Select-13)  . Ambulatory referral to Neurosurgery    Referral Priority:  Routine    Referral Type:  Surgical    Referral Reason:  Specialty Services Required    Requested Specialty:  Neurosurgery    Number of Visits Requested:  1    Imaging Ordered: AMB REFERRAL TO NEUROSURGERY  Interventional Therapies: Scheduled: None at this time. PRN Procedures: Right-sided L4-5 transforaminal epidural steroid injection under fluoroscopic guidance and IV sedation.    Referral(s) or Consult(s): Neurosurgery for surgical evaluation of the right L4-5 severe foraminal stenosis. The patient is afraid of surgery, but she is interested in looking at all of her possible alternatives.  Medications administered during this visit: Ms. Guarisco had no medications administered during this visit.  Future Appointments Date Time Provider [place]  10/13/2015 10:30 AM Glean Hess, MD [facility] None  10/30/2015 2:20 PM Milinda Pointer, MD [facility] None  Primary Care Physician: Halina Maidens, MD Location: [facility] Note by: Pinky Ravan A. Dossie Arbour, M.D, DABA, DABAPM, DABPM, DABIPP, FIPP

## 2015-08-09 NOTE — Patient Instructions (Addendum)
Smoking Cessation, Tips for Success If you are ready to quit smoking, congratulations! You have chosen to help yourself be healthier. Cigarettes bring nicotine, tar, carbon monoxide, and other irritants into your body. Your lungs, heart, and blood vessels will be able to work better without these poisons. There are many different ways to quit smoking. Nicotine gum, nicotine patches, a nicotine inhaler, or nicotine nasal spray can help with physical craving. Hypnosis, support groups, and medicines help break the habit of smoking. WHAT THINGS CAN I DO TO MAKE QUITTING EASIER?  Here are some tips to help you quit for good: 1. Pick a date when you will quit smoking completely. Tell all of your friends and family about your plan to quit on that date. 2. Do not try to slowly cut down on the number of cigarettes you are smoking. Pick a quit date and quit smoking completely starting on that day. 3. Throw away all cigarettes.  4. Clean and remove all ashtrays from your home, work, and car. 5. On a card, write down your reasons for quitting. Carry the card with you and read it when you get the urge to smoke. 6. Cleanse your body of nicotine. Drink enough water and fluids to keep your urine clear or pale yellow. Do this after quitting to flush the nicotine from your body. 7. Learn to predict your moods. Do not let a bad situation be your excuse to have a cigarette. Some situations in your life might tempt you into wanting a cigarette. 8. Never have "just one" cigarette. It leads to wanting another and another. Remind yourself of your decision to quit. 9. Change habits associated with smoking. If you smoked while driving or when feeling stressed, try other activities to replace smoking. Stand up when drinking your coffee. Brush your teeth after eating. Sit in a different chair when you read the paper. Avoid alcohol while trying to quit, and try to drink fewer caffeinated beverages. Alcohol and caffeine may urge you  to smoke. 10. Avoid foods and drinks that can trigger a desire to smoke, such as sugary or spicy foods and alcohol. 11. Ask people who smoke not to smoke around you. 12. Have something planned to do right after eating or having a cup of coffee. For example, plan to take a walk or exercise. 13. Try a relaxation exercise to calm you down and decrease your stress. Remember, you may be tense and nervous for the first 2 weeks after you quit, but this will pass. 14. Find new activities to keep your hands busy. Play with a pen, coin, or rubber band. Doodle or draw things on paper. 15. Brush your teeth right after eating. This will help cut down on the craving for the taste of tobacco after meals. You can also try mouthwash.  16. Use oral substitutes in place of cigarettes. Try using lemon drops, carrots, cinnamon sticks, or chewing gum. Keep them handy so they are available when you have the urge to smoke. 17. When you have the urge to smoke, try deep breathing. 18. Designate your home as a nonsmoking area. 19. If you are a heavy smoker, ask your health care provider about a prescription for nicotine chewing gum. It can ease your withdrawal from nicotine. 20. Reward yourself. Set aside the cigarette money you save and buy yourself something nice. 21. Look for support from others. Join a support group or smoking cessation program. Ask someone at home or at work to help you with your plan   to quit smoking. 22. Always ask yourself, "Do I need this cigarette or is this just a reflex?" Tell yourself, "Today, I choose not to smoke," or "I do not want to smoke." You are reminding yourself of your decision to quit. 23. Do not replace cigarette smoking with electronic cigarettes (commonly called e-cigarettes). The safety of e-cigarettes is unknown, and some may contain harmful chemicals. 24. If you relapse, do not give up! Plan ahead and think about what you will do the next time you get the urge to smoke. HOW WILL  I FEEL WHEN I QUIT SMOKING? You may have symptoms of withdrawal because your body is used to nicotine (the addictive substance in cigarettes). You may crave cigarettes, be irritable, feel very hungry, cough often, get headaches, or have difficulty concentrating. The withdrawal symptoms are only temporary. They are strongest when you first quit but will go away within 10-14 days. When withdrawal symptoms occur, stay in control. Think about your reasons for quitting. Remind yourself that these are signs that your body is healing and getting used to being without cigarettes. Remember that withdrawal symptoms are easier to treat than the major diseases that smoking can cause.  Even after the withdrawal is over, expect periodic urges to smoke. However, these cravings are generally short lived and will go away whether you smoke or not. Do not smoke! WHAT RESOURCES ARE AVAILABLE TO HELP ME QUIT SMOKING? Your health care provider can direct you to community resources or hospitals for support, which may include: 1. Group support. 2. Education. 3. Hypnosis. 4. Therapy.   This information is not intended to replace advice given to you by your health care provider. Make sure you discuss any questions you have with your health care provider.   Document Released: 03/29/2004 Document Revised: 07/22/2014 Document Reviewed: 12/17/2012 Elsevier Interactive Patient Education 2016 [employer] Epidural Steroid Injection Patient Information  Description: The epidural space surrounds the nerves as they exit the spinal cord.  In some patients, the nerves can be compressed and inflamed by a bulging disc or a tight spinal canal (spinal stenosis).  By injecting steroids into the epidural space, we can bring irritated nerves into direct contact with a potentially helpful medication.  These steroids act directly on the irritated nerves and can reduce swelling and inflammation which often leads to decreased pain.  Epidural  steroids may be injected anywhere along the spine and from the neck to the low back depending upon the location of your pain.   After numbing the skin with local anesthetic (like Novocaine), a small needle is passed into the epidural space slowly.  You may experience a sensation of pressure while this is being done.  The entire block usually last less than 10 minutes.  Conditions which may be treated by epidural steroids:   Low back and leg pain  Neck and arm pain  Spinal stenosis  Post-laminectomy syndrome  Herpes zoster (shingles) pain  Pain from compression fractures  Preparation for the injection:  5. Do not eat any solid food or dairy products within 6 hours of your appointment.  6. You may drink clear liquids up to 2 hours before appointment.  Clear liquids include water, black coffee, juice or soda.  No milk or cream please. 7. You may take your regular medication, including pain medications, with a sip of water before your appointment  Diabetics should hold regular insulin (if taken separately) and take 1/2 normal NPH dos the morning of the procedure.  Carry some  sugar containing items with you to your appointment. 8. A driver must accompany you and be prepared to drive you home after your procedure.  9. Bring all your current medications with your. 10. An IV may be inserted and sedation may be given at the discretion of the physician.   11. A blood pressure cuff, EKG and other monitors will often be applied during the procedure.  Some patients may need to have extra oxygen administered for a short period. 12. You will be asked to provide medical information, including your allergies, prior to the procedure.  We must know immediately if you are taking blood thinners (like Coumadin/Warfarin)  Or if you are allergic to IV iodine contrast (dye). We must know if you could possible be pregnant.  Possible side-effects:  Bleeding from needle site  Infection (rare, may require  surgery)  Nerve injury (rare)  Numbness & tingling (temporary)  Difficulty urinating (rare, temporary)  Spinal headache ( a headache worse with upright posture)  Light -headedness (temporary)  Pain at injection site (several days)  Decreased blood pressure (temporary)  Weakness in arm/leg (temporary)  Pressure sensation in back/neck (temporary)  Call if you experience:  Fever/chills associated with headache or increased back/neck pain.  Headache worsened by an upright position.  New onset weakness or numbness of an extremity below the injection site  Hives or difficulty breathing (go to the emergency room)  Inflammation or drainage at the infection site  Severe back/neck pain  Any new symptoms which are concerning to you  Please note:  Although the local anesthetic injected can often make your back or neck feel good for several hours after the injection, the pain will likely return.  It takes 3-7 days for steroids to work in the epidural space.  You may not notice any pain relief for at least that one week.  If effective, we will often do a series of three injections spaced 3-6 weeks apart to maximally decrease your pain.  After the initial series, we generally will wait several months before considering a repeat injection of the same type.  If you have any questions, please call [phone] [place]

## 2015-08-09 NOTE — Progress Notes (Signed)
Safety precautions to be maintained throughout the outpatient stay will include: orient to surroundings, keep bed in low position, maintain call bell within reach at all times, provide assistance with transfer out of bed and ambulation. Did not bring pill bottles.  Reminded to bring to all appointment.  Patient states she has 11 pills left.

## 2015-08-14 ENCOUNTER — Other Ambulatory Visit: Payer: Self-pay

## 2015-08-14 MED ORDER — MIRTAZAPINE 15 MG PO TBDP: 15.0000 mg | ORAL_TABLET | Freq: Every day | ORAL | Status: DC

## 2015-08-14 NOTE — Telephone Encounter (Signed)
Pharmacy Called requesting refill.

## 2015-08-17 LAB — TOXASSURE SELECT 13 (MW), URINE: PDF: 0

## 2015-10-11 ENCOUNTER — Other Ambulatory Visit: Payer: Self-pay | Admitting: Internal Medicine

## 2015-10-13 ENCOUNTER — Encounter: Payer: Self-pay | Admitting: Internal Medicine

## 2015-10-13 ENCOUNTER — Ambulatory Visit (INDEPENDENT_AMBULATORY_CARE_PROVIDER_SITE_OTHER): Payer: Medicare Other | Admitting: Internal Medicine

## 2015-10-13 VITALS — BP 141/80 | HR 76 | Ht 64.0 in | Wt 114.0 lb

## 2015-10-13 DIAGNOSIS — I10 Essential (primary) hypertension: Secondary | ICD-10-CM

## 2015-10-13 DIAGNOSIS — J431 Panlobular emphysema: Secondary | ICD-10-CM

## 2015-10-13 DIAGNOSIS — E785 Hyperlipidemia, unspecified: Secondary | ICD-10-CM

## 2015-10-13 DIAGNOSIS — F3341 Major depressive disorder, recurrent, in partial remission: Secondary | ICD-10-CM | POA: Diagnosis not present

## 2015-10-13 DIAGNOSIS — I739 Peripheral vascular disease, unspecified: Secondary | ICD-10-CM | POA: Diagnosis not present

## 2015-10-13 DIAGNOSIS — K274 Chronic or unspecified peptic ulcer, site unspecified, with hemorrhage: Secondary | ICD-10-CM | POA: Diagnosis not present

## 2015-10-13 DIAGNOSIS — Z0000 Encounter for general adult medical examination without abnormal findings: Secondary | ICD-10-CM

## 2015-10-13 DIAGNOSIS — Z23 Encounter for immunization: Secondary | ICD-10-CM | POA: Diagnosis not present

## 2015-10-13 LAB — POCT URINALYSIS DIPSTICK
Bilirubin, UA: NEGATIVE
GLUCOSE UA: NEGATIVE
Ketones, UA: NEGATIVE
Leukocytes, UA: NEGATIVE
NITRITE UA: NEGATIVE
PH UA: 6
Protein, UA: NEGATIVE
RBC UA: NEGATIVE
Spec Grav, UA: 1.01
UROBILINOGEN UA: 0.2

## 2015-10-13 NOTE — Progress Notes (Signed)
Patient: Tasha Reyes, Female    DOB: 1930/10/09, 82 y.o.   MRN: [id number] Visit Date: 10/13/2015  Today's Provider: Halina Maidens, MD   Chief Complaint  Patient presents with  . Medicare Wellness  . Hypertension  . Hyperlipidemia   Subjective:    Annual wellness visit Tasha Reyes is a 82 y.o. female who presents today for her Subsequent Annual Wellness Visit. She feels well. She reports exercising none. She reports she is sleeping fairly well.   ----------------------------------------------------------- Hypertension This is a chronic problem. The current episode started more than 1 year ago. The problem is unchanged. The problem is controlled. Associated symptoms include shortness of breath. Pertinent negatives include no chest pain, headaches or palpitations. Past treatments include beta blockers.  Hyperlipidemia This is a chronic problem. The current episode started more than 1 year ago. The problem is controlled. Recent lipid tests were reviewed and are normal. Associated symptoms include myalgias and shortness of breath. Pertinent negatives include no chest pain. Current antihyperlipidemic treatment includes statins.   Severe nerve impingement right L4-5 - she sees Dr. Wynona Canes for pain medication and ESI.  He feels that she needs to have surgery.  Depression/insomnia - she has been treated with zoloft and mirtazepine.  She weaned off due to somnolence and questionable benefit.  She feels that she is doing well currently and does not wish to try other medication.  Review of Systems  Constitutional: Negative for fever, chills, fatigue and unexpected weight change.  HENT: Negative for hearing loss.   Eyes: Negative for visual disturbance.  Respiratory: Positive for shortness of breath. Negative for cough, chest tightness and wheezing.   Cardiovascular: Negative for chest pain, palpitations and leg swelling.  Gastrointestinal: Positive for abdominal pain. Negative  for diarrhea and constipation.  Genitourinary: Negative for dysuria and hematuria.  Musculoskeletal: Positive for myalgias and back pain. Negative for joint swelling and neck stiffness.  Skin: Negative for rash and wound.  Neurological: Negative for dizziness, tremors, seizures, syncope and headaches.  Hematological: Negative for adenopathy.  Psychiatric/Behavioral: Negative for sleep disturbance and dysphoric mood. The patient is not nervous/anxious.     Social History   Social History  . Marital Status: Widowed    Spouse Name: N/A  . Number of Children: N/A  . Years of Education: N/A   Occupational History  . Not on file.   Social History Main Topics  . Smoking status: Current Every Day Smoker -- 0.50 packs/day for 61 years    Types: Cigarettes  . Smokeless tobacco: Not on file     Comment: patient not ready  . Alcohol Use: No  . Drug Use: No  . Sexual Activity: Not on file   Other Topics Concern  . Not on file   Social History Narrative    Patient Active Problem List   Diagnosis Date Noted  . Encounter for therapeutic drug level monitoring 08/09/2015  . Encounter for chronic pain management 08/09/2015  . Lumbar foraminal stenosis (Right) (Severe at L4-5) 06/20/2015  . Chronic pain 05/11/2015  . Chronic low back pain (Location of Secondary source of pain) (Bilateral) (R>L) 05/11/2015  . Chronic lower extremity pain (Location of Primary Source of Pain) (Right) 05/11/2015  . Lumbosacral Radiculopathy (Right) 05/11/2015  . Chronic radicular lumbar pain (Right) 05/11/2015  . Long term current use of opiate analgesic 05/11/2015  . Long term prescription opiate use 05/11/2015  . Opiate use 05/11/2015  . History of abdominal aortic aneurysm (AAA) repair 05/11/2015  .  Peripheral vascular disease ([place]) 05/11/2015  . Lumbar spondylosis 05/11/2015  . Hx of long term use of blood thinners (Plavix) 05/11/2015  . Lower extremity pain (Right) 05/11/2015  . Chronic pain  syndrome 05/11/2015  . Neurogenic pain 05/11/2015  . History of DVT (deep vein thrombosis) 05/11/2015  . History of peptic ulcer disease 05/11/2015  . Lumbar facet hypertrophy 05/11/2015  . Facet syndrome, lumbar 05/11/2015  . Aneurysm of iliac artery (HCC) 05/11/2015    Class: History of  . Dyslipidemia 12/23/2014  . Hx of iron deficiency anemia 12/23/2014  . Compulsive tobacco user syndrome 12/23/2014  . Benign neoplasm 12/23/2014  . Chronic peptic ulcer with bleeding 12/23/2014  . Essential (primary) hypertension 12/23/2014  . Depression, major, recurrent, in partial remission ([place]) 12/23/2014  . Alveolar emphysema of lung ([place]) 12/23/2014  . Vascular disorder of lower extremity 12/23/2014  . Restless leg 12/23/2014  . Chronic tension-type headache, intractable 12/23/2014    Past Surgical History  Procedure Laterality Date  . Partial hysterectomy    . Abdominal aortic aneurysm repair w/ endoluminal graft  2008  . Femoral-popliteal bypass graft Right 2008  . Back surgery    . Colonoscopy  2012  . Esophagogastroduodenoscopy  04/2013    gastric ulcers  . Abdominal hysterectomy    . Appendectomy    . Blood clot       removal right leg    Her family history includes Brain cancer in her father; Kidney disease in her mother.    Previous Medications   ATENOLOL (TENORMIN) 25 MG TABLET    TAKE 1 TABLET BY MOUTH DAILY   ATORVASTATIN (LIPITOR) 10 MG TABLET    TAKE 1 TABLET BY MOUTH EVERY NIGHT AT BEDTIME   CLOPIDOGREL (PLAVIX) 75 MG TABLET    Take 1 tablet by mouth daily.   FERROUS SULFATE 325 (65 FE) MG EC TABLET    Take 1 tablet (325 mg total) by mouth daily.   HYDROCODONE-ACETAMINOPHEN (NORCO/VICODIN) 5-325 MG TABLET    Take 1 tablet by mouth every 8 (eight) hours as needed for moderate pain or severe pain.   MAGNESIUM-OXIDE 400 (241.3 MG) MG TABLET    Take 1 tablet by mouth daily.   METHOCARBAMOL (ROBAXIN) 750 MG TABLET    Take 1 tablet by mouth 3 (three) times daily as  needed.   MIRTAZAPINE (REMERON SOL-TAB) 15 MG DISINTEGRATING TABLET    DISSOLVE 1 TABLET BY MOUTH AT BEDTIME ON THE TONGUE   PANTOPRAZOLE (PROTONIX) 40 MG TABLET    Take 1 tablet (40 mg total) by mouth 2 (two) times daily.   PROMETHAZINE (PHENERGAN) 12.5 MG TABLET    Take 12.5 mg by mouth every 6 (six) hours as needed for nausea or vomiting.    Patient Care Team: Glean Hess, MD as PCP - General (Family Medicine) [facility] as Surgeon (Vascular Surgery) Manya Silvas, MD (Gastroenterology)     Objective:   Vitals: BP 141/80 mmHg  Pulse 76  Ht '5\' 4"'$  (1.626 m)  Wt 114 lb (51.71 kg)  BMI 19.56 kg/m2  Physical Exam  Constitutional: She is oriented to person, place, and time. She appears well-developed and well-nourished. No distress.  HENT:  Head: Normocephalic and atraumatic.  Right Ear: No decreased hearing is noted.  Left Ear: No decreased hearing is noted.  Nose: Right sinus exhibits no maxillary sinus tenderness. Left sinus exhibits no maxillary sinus tenderness.  Mouth/Throat: Uvula is midline and oropharynx is clear and moist.  Excessive  cerumen bilaterally Portion of TM visualized is normal  Eyes: Conjunctivae and EOM are normal. Right eye exhibits no discharge. Left eye exhibits no discharge. No scleral icterus.  Neck: Normal range of motion. Carotid bruit is not present. No erythema present. No thyromegaly present.  Cardiovascular: Normal rate, regular rhythm and normal heart sounds.   Pulses:      Dorsalis pedis pulses are 1+ on the right side, and 1+ on the left side.       Posterior tibial pulses are 2+ on the right side, and 2+ on the left side.  Pulmonary/Chest: Effort normal. No respiratory distress. She has decreased breath sounds. She has no wheezes. She has no rhonchi. Right breast exhibits no mass, no nipple discharge, no skin change and no tenderness. Left breast exhibits no mass, no nipple discharge, no skin change and no tenderness.    Abdominal: Soft. Bowel sounds are normal. There is no hepatosplenomegaly. There is no tenderness. There is no CVA tenderness.  Musculoskeletal:       Right knee: She exhibits no swelling and no effusion.       Left knee: She exhibits no swelling and no effusion.  Lymphadenopathy:    She has no cervical adenopathy.    She has no axillary adenopathy.  Neurological: She is alert and oriented to person, place, and time. She has normal strength and normal reflexes. No cranial nerve deficit or sensory deficit.  Skin: Skin is warm, dry and intact. No rash noted.  Psychiatric: She has a normal mood and affect. Her speech is normal and behavior is normal. Thought content normal.  Nursing note and vitals reviewed.   Activities of Daily Living In your present state of health, do you have any difficulty performing the following activities: 12/28/2014  Hearing? N  Vision? N  Difficulty concentrating or making decisions? N  Walking or climbing stairs? N  Dressing or bathing? N  Doing errands, shopping? N    Fall Risk Assessment Fall Risk  08/09/2015 05/11/2015 12/28/2014  Falls in the past year? No No No      Depression Screen PHQ 2/9 Scores 08/09/2015 05/11/2015 12/28/2014  PHQ - 2 Score 0 0 5  PHQ- 9 Score - - 7    Cognitive Testing - 6-CIT   Correct? Score   What year is it? yes 0 Yes = 0    No = 4  What month is it? yes 0 Yes = 0    No = 3  Remember:     [address], [place], [place]     What time is it? yes 0 Yes = 0    No = 3  Count backwards from 20 to 1 yes 0 Correct = 0    1 error = 2   More than 1 error = 4  Say the months of the year in reverse. yes 0 Correct = 0    1 error = 2   More than 1 error = 4  What address did I ask you to remember? yes 0 Correct = 0  1 error = 2    2 error = 4    3 error = 6    4 error = 8    All wrong = 10       TOTAL SCORE  0/28   Interpretation:  Normal  Normal (0-7) Abnormal (8-28)        Medicare Annual Wellness Visit  Summary:  Reviewed patient's Family Medical History  Reviewed and updated list of patient's medical providers Assessment of cognitive impairment was done Assessed patient's functional ability Established a written schedule for health screening [place] Completed and Reviewed  Exercise Activities and Dietary recommendations Goals    None      Immunization History  Administered Date(s) Administered  . Pneumococcal Polysaccharide-23 01/18/2014    Health Maintenance  Topic Date Due  . TETANUS/TDAP  [birth year]28/1949  . ZOSTAVAX  07/27/1992  . DEXA SCAN  07/27/1997  . PNA vac Low Risk Adult (2 of 2 - PCV13) 01/19/2015  . INFLUENZA VACCINE  02/13/2016 (Originally 02/13/2015)     Discussed health benefits of physical activity, and encouraged her to engage in regular exercise appropriate for her age and condition.    ------------------------------------------------------------------------------------------------------------   Assessment & Plan:  1. Medicare annual wellness visit, subsequent Measures satisfied - POCT Urinalysis Dipstick  2. Essential (primary) hypertension controlled - CBC with Differential/Platelet - Comprehensive metabolic panel - TSH  3. Peripheral vascular disease ([place]) Stable; followed by Vascular surgery Continue Plavix - Lipid panel  4. Alveolar emphysema of lung ([place]) Pt is not interested in quitting smoking at this time and does not feel limited by her pulmonary status  5. Chronic peptic ulcer with bleeding No evidence of recurrence - continue PPI - CBC with Differential/Platelet  6. Dyslipidemia On statin therapy - Lipid panel  7. Depression, major, recurrent, in partial remission ([place]) Patient has weaned off of medication - does not want further treatment at this time  8. Need for pneumococcal vaccination - Pneumococcal conjugate vaccine 13-valent IM   Halina Maidens, MD [facility]  10/13/2015

## 2015-10-13 NOTE — Patient Instructions (Addendum)
Health Maintenance  Topic Date Due  . TETANUS/TDAP  07/28/1949  . ZOSTAVAX  07/27/1992  . DEXA SCAN  07/27/1997  . PNA vac Low Risk Adult (2 of 2 - PCV13) 01/19/2015  . INFLUENZA VACCINE  02/13/2016 (Originally 02/13/2015)   Breast Self-Awareness Practicing breast self-awareness may pick up problems early, prevent significant medical complications, and possibly save your life. By practicing breast self-awareness, you can become familiar with how your breasts look and feel and if your breasts are changing. This allows you to notice changes early. It can also offer you some reassurance that your breast health is good. One way to learn what is normal for your breasts and whether your breasts are changing is to do a breast self-exam. If you find a lump or something that was not present in the past, it is best to contact your caregiver right away. Other findings that should be evaluated by your caregiver include nipple discharge, especially if it is bloody; skin changes or reddening; areas where the skin seems to be pulled in (retracted); or new lumps and bumps. Breast pain is seldom associated with cancer (malignancy), but should also be evaluated by a caregiver. HOW TO PERFORM A BREAST SELF-EXAM The best time to examine your breasts is 5-7 days after your menstrual period is over. During menstruation, the breasts are lumpier, and it may be more difficult to pick up changes. If you do not menstruate, have reached menopause, or had your uterus removed (hysterectomy), you should examine your breasts at regular intervals, such as monthly. If you are breastfeeding, examine your breasts after a feeding or after using a breast pump. Breast implants do not decrease the risk for lumps or tumors, so continue to perform breast self-exams as recommended. Talk to your caregiver about how to determine the difference between the implant and breast tissue. Also, talk about the amount of pressure you should use during the  exam. Over time, you will become more familiar with the variations of your breasts and more comfortable with the exam. A breast self-exam requires you to remove all your clothes above the waist.  Look at your breasts and nipples. Stand in front of a mirror in a room with good lighting. With your hands on your hips, push your hands firmly downward. Look for a difference in shape, contour, and size from one breast to the other (asymmetry). Asymmetry includes puckers, dips, or bumps. Also, look for skin changes, such as reddened or scaly areas on the breasts. Look for nipple changes, such as discharge, dimpling, repositioning, or redness.  Carefully feel your breasts. This is best done either in the shower or tub while using soapy water or when flat on your back. Place the arm (on the side of the breast you are examining) above your head. Use the pads (not the fingertips) of your three middle fingers on your opposite hand to feel your breasts. Start in the underarm area and use  inch (2 cm) overlapping circles to feel your breast. Use 3 different levels of pressure (light, medium, and firm pressure) at each circle before moving to the next circle. The light pressure is needed to feel the tissue closest to the skin. The medium pressure will help to feel breast tissue a little deeper, while the firm pressure is needed to feel the tissue close to the ribs. Continue the overlapping circles, moving downward over the breast until you feel your ribs below your breast. Then, move one finger-width towards the center of the  body. Continue to use the  inch (2 cm) overlapping circles to feel your breast as you move slowly up toward the collar bone (clavicle) near the base of the neck. Continue the up and down exam using all 3 pressures until you reach the middle of the chest. Do this with each breast, carefully feeling for lumps or changes.   Keep a written record with breast changes or normal findings for each breast. By  writing this information down, you do not need to depend only on memory for size, tenderness, or location. Write down where you are in your menstrual cycle, if you are still menstruating. Breast tissue can have some lumps or thick tissue. However, see your caregiver if you find anything that concerns you.  SEEK MEDICAL CARE IF:  You see a change in shape, contour, or size of your breasts or nipples.   You see skin changes, such as reddened or scaly areas on the breasts or nipples.   You have an unusual discharge from your nipples.   You feel a new lump or unusually thick areas.    This information is not intended to replace advice given to you by your health care provider. Make sure you discuss any questions you have with your health care provider.   Document Released: 07/01/2005 Document Revised: 06/17/2012 Document Reviewed: 10/16/2011 Elsevier Interactive Patient Education 2016 Elsevier Inc. Pneumococcal Conjugate Vaccine (PCV13)  1. Why get vaccinated? Vaccination can protect both children and adults from pneumococcal disease. Pneumococcal disease is caused by bacteria that can spread from person to person through close contact. It can cause ear infections, and it can also lead to more serious infections of the:  Lungs (pneumonia),  Blood (bacteremia), and  Covering of the brain and spinal cord (meningitis). Pneumococcal pneumonia is most common among adults. Pneumococcal meningitis can cause deafness and brain damage, and it kills about 1 child in 10 who get it. Anyone can get pneumococcal disease, but children under 22 years of age and adults 11 years and older, people with certain medical conditions, and cigarette smokers are at the highest risk. Before there was a vaccine, the [place] saw:  more than 700 cases of meningitis,  about 13,000 blood infections,  about 5 million ear infections, and  about 200 deaths in children under 5 each year from pneumococcal  disease. Since vaccine became available, severe pneumococcal disease in these children has fallen by 88%. About 18,000 older adults die of pneumococcal disease each year in the [place]. Treatment of pneumococcal infections with penicillin and other drugs is not as effective as it used to be, because some strains of the disease have become resistant to these drugs. This makes prevention of the disease, through vaccination, even more important. 2. PCV13 vaccine Pneumococcal conjugate vaccine (called PCV13) protects against 13 types of pneumococcal bacteria. PCV13 is routinely given to children at 2, 4, 6, and 2-35 months of age. It is also recommended for children and adults 47 to 42 years of age with certain health conditions, and for all adults 65 years of age and older. Your doctor can give you details. 3. Some people should not get this vaccine Anyone who has ever had a life-threatening allergic reaction to a dose of this vaccine, to an earlier pneumococcal vaccine called PCV7, or to any vaccine containing diphtheria toxoid (for example, DTaP), should not get PCV13. Anyone with a severe allergy to any component of PCV13 should not get the vaccine. Tell your doctor if the  person being vaccinated has any severe allergies. If the person scheduled for vaccination is not feeling well, your healthcare provider might decide to reschedule the shot on another day. 4. Risks of a vaccine reaction With any medicine, including vaccines, there is a chance of reactions. These are usually mild and go away on their own, but serious reactions are also possible. Problems reported following PCV13 varied by age and dose in the series. The most common problems reported among children were:  About half became drowsy after the shot, had a temporary loss of appetite, or had redness or tenderness where the shot was given.  About 1 out of 3 had swelling where the shot was given.  About 1 out of 3 had a mild fever,  and about 1 in 20 had a fever over 102.71F.  Up to about 8 out of 10 became fussy or irritable. Adults have reported pain, redness, and swelling where the shot was given; also mild fever, fatigue, headache, chills, or muscle pain. Young children who get PCV13 along with inactivated flu vaccine at the same time may be at increased risk for seizures caused by fever. Ask your doctor for more information. Problems that could happen after any vaccine:  People sometimes faint after a medical procedure, including vaccination. Sitting or lying down for about 15 minutes can help prevent fainting, and injuries caused by a fall. Tell your doctor if you feel dizzy, or have vision changes or ringing in the ears.  Some older children and adults get severe pain in the shoulder and have difficulty moving the arm where a shot was given. This happens very rarely.  Any medication can cause a severe allergic reaction. Such reactions from a vaccine are very rare, estimated at about 1 in a million doses, and would happen within a few minutes to a few hours after the vaccination. As with any medicine, there is a very small chance of a vaccine causing a serious injury or death. The safety of vaccines is always being monitored. For more information, visit: http://www.aguilar.org/ 5. What if there is a serious reaction? What should I look for?  Look for anything that concerns you, such as signs of a severe allergic reaction, very high fever, or unusual behavior. Signs of a severe allergic reaction can include hives, swelling of the face and throat, difficulty breathing, a fast heartbeat, dizziness, and weakness-usually within a few minutes to a few hours after the vaccination. What should I do?  If you think it is a severe allergic reaction or other emergency that can't wait, call 9-1-1 or get the person to the nearest hospital. Otherwise, call your doctor. Reactions should be reported to the Vaccine Adverse Event  Reporting System (VAERS). Your doctor should file this report, or you can do it yourself through the VAERS web site at www.vaers.SamedayNews.es, or by calling 667-147-6180. VAERS does not give medical advice. 6. The National Vaccine Injury Compensation Program The [employer] Vaccine Injury Compensation Program (VICP) is a federal program that was created to compensate people who may have been injured by certain vaccines. Persons who believe they may have been injured by a vaccine can learn about the program and about filing a claim by calling 918 266 9630 or visiting the [place] website at GoldCloset.com.ee. There is a time limit to file a claim for compensation. 7. How can I learn more?  Ask your healthcare provider. He or she can give you the vaccine package insert or suggest other sources of information.  Call your  local or state health department.  Contact the Centers for Disease Control and Prevention (CDC):  Call 820-711-1663 (1-800-CDC-INFO) or  Visit CDC's website at http://hunter.com/ Vaccine Information Statement PCV13 Vaccine (05/19/2014)   This information is not intended to replace advice given to you by your health care provider. Make sure you discuss any questions you have with your health care provider.   Document Released: 04/28/2006 Document Revised: 07/22/2014 Document Reviewed: 05/26/2014 Elsevier Interactive Patient Education [employer].

## 2015-10-14 LAB — COMPREHENSIVE METABOLIC PANEL
ALT: 10 IU/L (ref 0–32)
AST: 19 IU/L (ref 0–40)
Albumin/Globulin Ratio: 1.4 (ref 1.2–2.2)
Albumin: 4.5 g/dL (ref 3.5–4.7)
Alkaline Phosphatase: 96 IU/L (ref 39–117)
BUN/Creatinine Ratio: 11 (ref 11–26)
BUN: 9 mg/dL (ref 8–27)
Bilirubin Total: 0.3 mg/dL (ref 0.0–1.2)
CALCIUM: 9.5 mg/dL (ref 8.7–10.3)
CO2: 25 mmol/L (ref 18–29)
CREATININE: 0.85 mg/dL (ref 0.57–1.00)
Chloride: 97 mmol/L (ref 96–106)
GFR, EST AFRICAN AMERICAN: 73 mL/min/{1.73_m2} (ref >59)
GFR, EST NON AFRICAN AMERICAN: 64 mL/min/{1.73_m2} (ref >59)
GLUCOSE: 99 mg/dL (ref 65–99)
Globulin, Total: 3.2 g/dL (ref 1.5–4.5)
Potassium: 4.5 mmol/L (ref 3.5–5.2)
Sodium: 139 mmol/L (ref 134–144)
TOTAL PROTEIN: 7.7 g/dL (ref 6.0–8.5)

## 2015-10-14 LAB — CBC WITH DIFFERENTIAL/PLATELET
BASOS ABS: 0 10*3/uL (ref 0.0–0.2)
BASOS: 0 %
EOS (ABSOLUTE): 0.1 10*3/uL (ref 0.0–0.4)
Eos: 1 %
Hematocrit: 44.7 % (ref 34.0–46.6)
Hemoglobin: 15 g/dL (ref 11.1–15.9)
IMMATURE GRANS (ABS): 0 10*3/uL (ref 0.0–0.1)
IMMATURE GRANULOCYTES: 0 %
LYMPHS: 26 %
Lymphocytes Absolute: 2.3 10*3/uL (ref 0.7–3.1)
MCH: 29.5 pg (ref 26.6–33.0)
MCHC: 33.6 g/dL (ref 31.5–35.7)
MCV: 88 fL (ref 79–97)
MONOCYTES: 9 %
Monocytes Absolute: 0.8 10*3/uL (ref 0.1–0.9)
NEUTROS PCT: 64 %
Neutrophils Absolute: 5.6 10*3/uL (ref 1.4–7.0)
PLATELETS: 469 10*3/uL — AB (ref 150–379)
RBC: 5.08 x10E6/uL (ref 3.77–5.28)
RDW: 14.2 % (ref 12.3–15.4)
WBC: 8.9 10*3/uL (ref 3.4–10.8)

## 2015-10-14 LAB — LIPID PANEL
CHOL/HDL RATIO: 2.7 ratio (ref 0.0–4.4)
Cholesterol, Total: 149 mg/dL (ref 100–199)
HDL: 55 mg/dL (ref >39)
LDL Calculated: 66 mg/dL (ref 0–99)
Triglycerides: 141 mg/dL (ref 0–149)
VLDL CHOLESTEROL CAL: 28 mg/dL (ref 5–40)

## 2015-10-14 LAB — TSH: TSH: 3.54 u[IU]/mL (ref 0.450–4.500)

## 2015-10-18 ENCOUNTER — Telehealth: Payer: Self-pay

## 2015-10-18 ENCOUNTER — Other Ambulatory Visit: Payer: Self-pay | Admitting: Internal Medicine

## 2015-10-18 NOTE — Telephone Encounter (Signed)
-----   Message from Glean Hess, MD sent at 10/15/2015  1:59 PM EDT ----- Labs are all normal.  Continue same medications.

## 2015-10-18 NOTE — Telephone Encounter (Signed)
Spoke with patient. Patient advised of all results and verbalized understanding. Will call back with any future questions or concerns. MAH  

## 2015-10-30 ENCOUNTER — Encounter: Payer: Medicare Other | Admitting: Pain Medicine

## 2015-11-01 ENCOUNTER — Encounter: Payer: Medicare Other | Admitting: Pain Medicine

## 2015-11-07 ENCOUNTER — Encounter: Payer: Self-pay | Admitting: Pain Medicine

## 2015-11-07 ENCOUNTER — Ambulatory Visit: Payer: Medicare Other | Attending: Pain Medicine | Admitting: Pain Medicine

## 2015-11-07 VITALS — BP 159/68 | HR 70 | Temp 97.7°F | Resp 16 | Ht 64.0 in | Wt 114.0 lb

## 2015-11-07 DIAGNOSIS — M7918 Myalgia, other site: Secondary | ICD-10-CM

## 2015-11-07 DIAGNOSIS — F329 Major depressive disorder, single episode, unspecified: Secondary | ICD-10-CM | POA: Insufficient documentation

## 2015-11-07 DIAGNOSIS — K5903 Drug induced constipation: Secondary | ICD-10-CM

## 2015-11-07 DIAGNOSIS — K219 Gastro-esophageal reflux disease without esophagitis: Secondary | ICD-10-CM | POA: Diagnosis not present

## 2015-11-07 DIAGNOSIS — G44221 Chronic tension-type headache, intractable: Secondary | ICD-10-CM | POA: Diagnosis not present

## 2015-11-07 DIAGNOSIS — M810 Age-related osteoporosis without current pathological fracture: Secondary | ICD-10-CM

## 2015-11-07 DIAGNOSIS — M79604 Pain in right leg: Secondary | ICD-10-CM | POA: Diagnosis not present

## 2015-11-07 DIAGNOSIS — Z79891 Long term (current) use of opiate analgesic: Secondary | ICD-10-CM

## 2015-11-07 DIAGNOSIS — M545 Low back pain: Secondary | ICD-10-CM | POA: Diagnosis not present

## 2015-11-07 DIAGNOSIS — J438 Other emphysema: Secondary | ICD-10-CM | POA: Diagnosis not present

## 2015-11-07 DIAGNOSIS — E785 Hyperlipidemia, unspecified: Secondary | ICD-10-CM | POA: Diagnosis not present

## 2015-11-07 DIAGNOSIS — M791 Myalgia: Secondary | ICD-10-CM | POA: Diagnosis not present

## 2015-11-07 DIAGNOSIS — Z86718 Personal history of other venous thrombosis and embolism: Secondary | ICD-10-CM | POA: Diagnosis not present

## 2015-11-07 DIAGNOSIS — R29898 Other symptoms and signs involving the musculoskeletal system: Secondary | ICD-10-CM | POA: Insufficient documentation

## 2015-11-07 DIAGNOSIS — G2581 Restless legs syndrome: Secondary | ICD-10-CM | POA: Diagnosis not present

## 2015-11-07 DIAGNOSIS — Z5181 Encounter for therapeutic drug level monitoring: Secondary | ICD-10-CM

## 2015-11-07 DIAGNOSIS — F17210 Nicotine dependence, cigarettes, uncomplicated: Secondary | ICD-10-CM | POA: Diagnosis not present

## 2015-11-07 DIAGNOSIS — G8929 Other chronic pain: Secondary | ICD-10-CM | POA: Diagnosis not present

## 2015-11-07 DIAGNOSIS — I739 Peripheral vascular disease, unspecified: Secondary | ICD-10-CM | POA: Diagnosis not present

## 2015-11-07 DIAGNOSIS — Z90710 Acquired absence of both cervix and uterus: Secondary | ICD-10-CM | POA: Diagnosis not present

## 2015-11-07 DIAGNOSIS — Z7902 Long term (current) use of antithrombotics/antiplatelets: Secondary | ICD-10-CM | POA: Diagnosis not present

## 2015-11-07 DIAGNOSIS — M5417 Radiculopathy, lumbosacral region: Secondary | ICD-10-CM | POA: Insufficient documentation

## 2015-11-07 DIAGNOSIS — I10 Essential (primary) hypertension: Secondary | ICD-10-CM | POA: Diagnosis not present

## 2015-11-07 DIAGNOSIS — K254 Chronic or unspecified gastric ulcer with hemorrhage: Secondary | ICD-10-CM | POA: Insufficient documentation

## 2015-11-07 DIAGNOSIS — M549 Dorsalgia, unspecified: Secondary | ICD-10-CM | POA: Diagnosis present

## 2015-11-07 DIAGNOSIS — T402X5A Adverse effect of other opioids, initial encounter: Secondary | ICD-10-CM | POA: Insufficient documentation

## 2015-11-07 DIAGNOSIS — J449 Chronic obstructive pulmonary disease, unspecified: Secondary | ICD-10-CM | POA: Insufficient documentation

## 2015-11-07 DIAGNOSIS — M4806 Spinal stenosis, lumbar region: Secondary | ICD-10-CM | POA: Diagnosis not present

## 2015-11-07 DIAGNOSIS — Z8679 Personal history of other diseases of the circulatory system: Secondary | ICD-10-CM | POA: Insufficient documentation

## 2015-11-07 DIAGNOSIS — D509 Iron deficiency anemia, unspecified: Secondary | ICD-10-CM | POA: Insufficient documentation

## 2015-11-07 MED ORDER — HYDROCODONE-ACETAMINOPHEN 5-325 MG PO TABS: 1.0000 | ORAL_TABLET | Freq: Three times a day (TID) | ORAL | Status: DC | PRN

## 2015-11-07 MED ORDER — LUBIPROSTONE 8 MCG PO CAPS: 8.0000 ug | ORAL_CAPSULE | Freq: Two times a day (BID) | ORAL | Status: DC

## 2015-11-07 MED ORDER — BENEFIBER PO POWD: ORAL | Status: DC

## 2015-11-07 MED ORDER — METHOCARBAMOL 750 MG PO TABS: 750.0000 mg | ORAL_TABLET | Freq: Three times a day (TID) | ORAL | Status: DC | PRN

## 2015-11-07 MED ORDER — MAGNESIUM-OXIDE 400 (241.3 MG) MG PO TABS: 1.0000 | ORAL_TABLET | Freq: Every day | ORAL | Status: DC

## 2015-11-07 NOTE — Progress Notes (Signed)
Patient's Name: Tasha Reyes  Patient type: Established  MRN: [id number]  Service setting: Ambulatory outpatient  DOB: 06-Sep-1930  Location: [facility]  DOS: 11/07/2015  Primary Care Physician: Halina Maidens, MD  Note by: Kathlen Brunswick. Dossie Arbour, M.D, DABA, DABAPM, DABPM, Milagros Evener, [place]  Referring Physician: Glean Hess, MD  Specialty: Board-Certified Interventional Pain Management     Primary Reason(s) for Visit: Encounter for prescription drug management (Level of risk: moderate) CC: Back Pain   HPI  Ms. Pinheiro is a 82 y.o. year old, female patient, who returns today as an established patient. She has Dyslipidemia; Hx of iron deficiency anemia; Compulsive tobacco user syndrome; Benign neoplasm; Chronic peptic ulcer with bleeding; Essential (primary) hypertension; Depression, major, recurrent, in partial remission ([place]); Alveolar emphysema of lung ([place]); Vascular disorder of lower extremity; Restless leg; Chronic tension-type headache, intractable; Chronic pain; Chronic low back pain (Location of Secondary source of pain) (Bilateral) (R>L); Chronic lower extremity pain (Location of Primary Source of Pain) (Right); Lumbosacral Radiculopathy (Right); Chronic radicular lumbar pain (Right); Long term current use of opiate analgesic; Long term prescription opiate use; Opiate use; History of abdominal aortic aneurysm (AAA) repair; Peripheral vascular disease ([place]); Lumbar spondylosis; Hx of long term use of blood thinners (Plavix); Lower extremity pain (Right); Chronic pain syndrome; Neurogenic pain; History of DVT (deep vein thrombosis); History of peptic ulcer disease; Lumbar facet hypertrophy; Facet syndrome, lumbar; Aneurysm of iliac artery ([place]); Lumbar foraminal stenosis (Right) (Severe at L4-5); Encounter for therapeutic drug level monitoring; Encounter for chronic pain management; Opioid-induced constipation (OIC); Osteoporosis, post-menopausal; Musculoskeletal  pain; and GERD (gastroesophageal reflux disease) on her problem list.. Her primarily concern today is the Back Pain   Pain Assessment: Self-Reported Pain Score: 3  Reported level is compatible with observation Pain Type: Chronic pain Pain Location: Back Pain Orientation: Lower Pain Descriptors / Indicators: Aching, Nagging Pain Frequency: Constant  The patient comes into the clinics today for pharmacological management of her chronic pain.  Date of Last Visit: 08/09/15 Service Provided on Last Visit: Med Refill  Controlled Substance Pharmacotherapy Assessment & REMS (Risk Evaluation and Mitigation Strategy)  Analgesic: Hydrocodone/APAP 5/325 one tablet every 8 hours (15 mg/day) Pill Count: Hydrocodone pill count # 26/90 Filled 10-13-15 MME/day: 15 mg/day Pharmacokinetics: Onset of action (Liberation/Absorption): Within expected pharmacological parameters Time to Peak effect (Distribution): Timing and results are as within normal expected parameters Duration of action (Metabolism/Excretion): Within normal limits for medication Pharmacodynamics: Analgesic Effect: More than 50% Activity Facilitation: Medication(s) allow patient to sit, stand, walk, and do the basic ADLs Perceived Effectiveness: Described as relatively effective, allowing for increase in activities of daily living (ADL) Side-effects or Adverse reactions: None reported Monitoring: [place] PMP: Online review of the past 70-monthperiod conducted. Compliant with practice rules and regulations UDS Results/interpretation: Last UDS done on 08/09/2015 came back within normal limits with no unexpected results. Medication Assessment Form: Reviewed. Patient indicates being compliant with therapy Treatment compliance: Compliant Risk Assessment: Aberrant Behavior: None observed today Substance Use Disorder (SUD) Risk Level: No change since last visit Risk of opioid abuse or dependence: 0.7-3.0% with doses ? 36 MME/day and 6.1-26%  with doses ? 120 MME/day. Opioid Risk Tool (ORT) Score: Total Score: 0 Low Risk for SUD (Score <3) Depression Scale Score: PHQ-2: PHQ-2 Total Score: 0 No depression (0) PHQ-9: PHQ-9 Total Score: 0 No depression (0-4)  Pharmacologic Plan: No change in therapy, at this time  Laboratory Chemistry  Inflammation Markers No results found for: ESRSEDRATE, CRP  Renal Function Lab Results  Component Value Date   BUN 9 10/13/2015   CREATININE 0.85 10/13/2015   GFRAA 73 10/13/2015   GFRNONAA 64 10/13/2015    Hepatic Function Lab Results  Component Value Date   AST 19 10/13/2015   ALT 10 10/13/2015   ALBUMIN 4.5 10/13/2015    Electrolytes Lab Results  Component Value Date   NA 139 10/13/2015   K 4.5 10/13/2015   CL 97 10/13/2015   CALCIUM 9.5 10/13/2015    Pain Modulating Vitamins No results found for: VD25OH, [id number], DE0814GY1, EH6314HF0, VITAMINB12  Coagulation Parameters Lab Results  Component Value Date   INR 1.1 02/21/2013   LABPROT 14.2 02/21/2013    Note: I personally reviewed the above data. Results shared with patient.  Meds  The patient has a current medication list which includes the following prescription(s): atenolol, atorvastatin, clopidogrel, ferrous sulfate, hydrocodone-acetaminophen, hydrocodone-acetaminophen, hydrocodone-acetaminophen, magnesium-oxide, methocarbamol, mirtazapine, pantoprazole, lubiprostone, and benefiber.  Current Outpatient Prescriptions on File Prior to Visit  Medication Sig  . atenolol (TENORMIN) 25 MG tablet TAKE 1 TABLET BY MOUTH DAILY  . atorvastatin (LIPITOR) 10 MG tablet TAKE 1 TABLET BY MOUTH EVERY NIGHT AT BEDTIME  . clopidogrel (PLAVIX) 75 MG tablet Take 1 tablet by mouth daily.  . ferrous sulfate 325 (65 FE) MG EC tablet Take 1 tablet (325 mg total) by mouth daily.  . pantoprazole (PROTONIX) 40 MG tablet TAKE 1 TABLET(40 MG) BY MOUTH TWICE DAILY   No current facility-administered medications on file prior to visit.     ROS  Constitutional: Afebrile, no chills, well hydrated and well nourished Gastrointestinal: No upper or lower GI bleeding, no nausea, no vomiting and no acute GI distress Musculoskeletal: No acute joint swelling or redness, no acute loss of range of motion and no acute onset weakness Neurological: Denies any acute onset apraxia, no episodes of paralysis, no acute loss of coordination, no acute loss of consciousness and no acute onset aphasia, dysarthria, agnosia, or amnesia  Allergies  Ms. Tillman has No Known Allergies.  [place]  Medical:  Ms. Pha  has a past medical history of [name]; Hypertension; COPD (chronic obstructive pulmonary disease) ([place]); Cystitis; Migraines; Stroke ([place]); [name]; Vascular disease; History of DVT (deep vein thrombosis) (05/11/2015); History of peptic ulcer disease (05/11/2015); and Aneurysm of iliac artery ([place]) (05/11/2015). Family: family history includes Brain cancer in her father; Kidney disease in her mother. Surgical:  has past surgical history that includes Partial hysterectomy; [name]; [name], 2008); Back surgery; Colonoscopy (2012); Esophagogastroduodenoscopy (04/2013); Abdominal hysterectomy; Appendectomy; and blood clot . Tobacco:  reports that she has been smoking Cigarettes.  She has a 30.5 pack-year smoking history. She does not have any smokeless tobacco history on file. Alcohol:  reports that she does not drink alcohol. Drug:  reports that she does not use illicit drugs.  Physical Examination  Constitutional Vitals: Blood pressure 159/68, pulse 70, temperature 97.7 F (36.5 C), resp. rate 16, height '5\' 4"'$  (1.626 m), weight 114 lb (51.71 kg), SpO2 97 %. Calculated BMI: Body mass index is 19.56 kg/(m^2).       General appearance: Alert, cooperative, oriented x 3, in no acute distress, well nourished, well developed, well  hydrated Eyes: PERLA Respiratory: No evidence respiratory distress, no audible rales or ronchi and no use of accessory muscles of respiration Psych: Alert, oriented to person, oriented to place and oriented to time  Cervical Spine Exam  Inspection: Normal  anatomy, no anomalies observed Cervical Lordosis: Normal Alignment: Symetrical Functional ROM: Within functional limits (WFL) AROM: WFL Sensory: No sensory anomalies reported or detected  Upper Extremity Exam    Right  Left  Inspection: No gross anomalies detected  Inspection: No gross anomalies detected  Functional ROM: Adequate  Functional ROM: Adequate  AROM: Adequate  AROM: Adequate  Sensory: No sensory anomalies reported or detected  Sensory: No sensory anomalies reported or detected  Motor: Unremarkable  Motor: Unremarkable  Vascular: Normal skin color, temperature, and hair growth. No peripheral edema or cyanosis  Vascular: Normal skin color, temperature, and hair growth. No peripheral edema or cyanosis   Thoracic Spine  Inspection: No gross anomalies detected Alignment: Symetrical Functional ROM: Within functional limits [facility]) AROM: Adequate Palpation: WNL  Lumbar Spine  Inspection: No gross anomalies detected Alignment: Symetrical Functional ROM: Within functional limits (WFL) AROM: Adequate Sensory: No sensory anomalies reported or detected Palpation: WNL Provocative Tests: Lumbar Hyperextension and rotation test: deferred Patrick's Maneuver: deferred  Gait Assessment  Gait: WNL  Lower Extremities    Right  Left  Inspection: No gross anomalies detected  Inspection: No gross anomalies detected  Functional ROM: Within functional limits [facility])  Functional ROM: Within functional limits (WFL)  AROM: Adequate  AROM: Adequate  Sensory: No sensory anomalies reported or detected  Sensory: No sensory anomalies reported or detected  Motor: Unremarkable  Motor: Unremarkable  Toe walk (S1): WNL  Toe walk (S1): WNL   Heal walk (L5): WNL  Heal walk (L5): WNL   Assessment & Plan  Primary Diagnosis & Pertinent Problem List: The primary encounter diagnosis was Chronic pain. Diagnoses of Encounter for therapeutic drug level monitoring, Long term current use of opiate analgesic, Chronic lower extremity pain (Location of Primary Source of Pain) (Right), Opioid-induced constipation (OIC), Osteoporosis, post-menopausal, Musculoskeletal pain, and Gastroesophageal reflux disease without esophagitis were also pertinent to this visit.  Visit Diagnosis: 1. Chronic pain   2. Encounter for therapeutic drug level monitoring   3. Long term current use of opiate analgesic   4. Chronic lower extremity pain (Location of Primary Source of Pain) (Right)   5. Opioid-induced constipation (OIC)   6. Osteoporosis, post-menopausal   7. Musculoskeletal pain   8. Gastroesophageal reflux disease without esophagitis     Problems updated and reviewed during this visit: Problem  Musculoskeletal Pain  Opioid-induced constipation (OIC)  Osteoporosis, Post-Menopausal  Gerd (Gastroesophageal Reflux Disease)    Problem-specific Plan(s): No problem-specific assessment & plan notes found for this encounter.  No new assessment & plan notes have been filed under this hospital service since the last note was generated. Service: Pain Management   Plan of Care   Problem List Items Addressed This Visit      High   Chronic lower extremity pain (Location of Primary Source of Pain) (Right) (Chronic)   Relevant Medications   mirtazapine (REMERON SOL-TAB) 15 MG disintegrating tablet   HYDROcodone-acetaminophen (NORCO/VICODIN) 5-325 MG tablet   HYDROcodone-acetaminophen (NORCO/VICODIN) 5-325 MG tablet   HYDROcodone-acetaminophen (NORCO/VICODIN) 5-325 MG tablet   methocarbamol (ROBAXIN) 750 MG tablet   Chronic pain - Primary (Chronic)   Relevant Medications   mirtazapine (REMERON SOL-TAB) 15 MG disintegrating tablet    HYDROcodone-acetaminophen (NORCO/VICODIN) 5-325 MG tablet   HYDROcodone-acetaminophen (NORCO/VICODIN) 5-325 MG tablet   HYDROcodone-acetaminophen (NORCO/VICODIN) 5-325 MG tablet   methocarbamol (ROBAXIN) 750 MG tablet   Other Relevant Orders   Comprehensive metabolic panel   C-reactive protein   Magnesium   Sedimentation rate   Musculoskeletal  pain (Chronic)   Relevant Medications   methocarbamol (ROBAXIN) 750 MG tablet     Medium   Encounter for therapeutic drug level monitoring   Long term current use of opiate analgesic (Chronic)   Relevant Orders   ToxASSURE Select 13 (MW), Urine   Opioid-induced constipation (OIC) (Chronic)   Relevant Medications   Wheat Dextrin (BENEFIBER) POWD   lubiprostone (AMITIZA) 8 MCG capsule     Low   GERD (gastroesophageal reflux disease)   Relevant Medications   MAGNESIUM-OXIDE 400 (241.3 Mg) MG tablet   Wheat Dextrin (BENEFIBER) POWD   lubiprostone (AMITIZA) 8 MCG capsule   Osteoporosis, post-menopausal   Relevant Orders   Vitamin D 1,25 dihydroxy       Pharmacotherapy (Medications Ordered): Meds ordered this encounter  Medications  . HYDROcodone-acetaminophen (NORCO/VICODIN) 5-325 MG tablet    Sig: Take 1 tablet by mouth every 8 (eight) hours as needed for moderate pain or severe pain.    Dispense:  90 tablet    Refill:  0    Do not place this medication, or any other prescription from our practice, on "Automatic Refill". Patient may have prescription filled one day early if pharmacy is closed on scheduled refill date. Do not fill until: 11/07/15 To last until: 12/07/15  . HYDROcodone-acetaminophen (NORCO/VICODIN) 5-325 MG tablet    Sig: Take 1 tablet by mouth every 8 (eight) hours as needed for moderate pain or severe pain.    Dispense:  90 tablet    Refill:  0    Do not place this medication, or any other prescription from our practice, on "Automatic Refill". Patient may have prescription filled one day early if pharmacy is closed  on scheduled refill date. Do not fill until: 12/07/15 To last until: 01/06/16  . HYDROcodone-acetaminophen (NORCO/VICODIN) 5-325 MG tablet    Sig: Take 1 tablet by mouth every 8 (eight) hours as needed for moderate pain or severe pain.    Dispense:  90 tablet    Refill:  0    Do not place this medication, or any other prescription from our practice, on "Automatic Refill". Patient may have prescription filled one day early if pharmacy is closed on scheduled refill date. Do not fill until: 01/06/16 To last until: 02/05/16  . MAGNESIUM-OXIDE 400 (241.3 Mg) MG tablet    Sig: Take 1 tablet (400 mg total) by mouth daily. Reported on 11/07/2015    Dispense:  30 tablet    Refill:  2    Do not add this medication to the electronic "Automatic Refill" notification system. Patient may have prescription filled one day early if pharmacy is closed on scheduled refill date.  . methocarbamol (ROBAXIN) 750 MG tablet    Sig: Take 1 tablet (750 mg total) by mouth 3 (three) times daily as needed for muscle spasms.    Dispense:  90 tablet    Refill:  2    Do not add this medication to the electronic "Automatic Refill" notification system. Patient may have prescription filled one day early if pharmacy is closed on scheduled refill date.  . Wheat Dextrin (BENEFIBER) POWD    Sig: Stir 2 tsp. TID into 4-8 oz of any non-carbonated beverage or soft food (hot or cold)    Dispense:  500 g    Refill:  PRN    Do not place this medication, or any other prescription from our practice, on "Automatic Refill". Patient may have prescription filled one day early if pharmacy is closed on scheduled  refill date.  . lubiprostone (AMITIZA) 8 MCG capsule    Sig: Take 1 capsule (8 mcg total) by mouth 2 (two) times daily with a meal. Swallow the medication whole. Do not break or chew the medication.    Dispense:  60 capsule    Refill:  PRN    Do not place this medication, or any other prescription from our practice, on "Automatic  Refill". Patient may have prescription filled one day early if pharmacy is closed on scheduled refill date.    Lab-work & Procedure Ordered: Orders Placed This Encounter  Procedures  . ToxASSURE Select 13 (MW), Urine  . Comprehensive metabolic panel  . C-reactive protein  . Magnesium  . Sedimentation rate  . Vitamin D 1,25 dihydroxy    Imaging Ordered: None  Interventional Therapies: Scheduled:  None at this time.    Considering:  Lumbar epidural steroid injection under fluoroscopic guidance and IV sedation.    PRN Procedures:  Palliative right-sided lumbar epidural steroid injection at the L4-L5 level under fluoroscopic guidance and IV sedation.    Referral(s) or Consult(s): None at this time.  New Prescriptions   LUBIPROSTONE (AMITIZA) 8 MCG CAPSULE    Take 1 capsule (8 mcg total) by mouth 2 (two) times daily with a meal. Swallow the medication whole. Do not break or chew the medication.   WHEAT DEXTRIN (BENEFIBER) POWD    Stir 2 tsp. TID into 4-8 oz of any non-carbonated beverage or soft food (hot or cold)    Medications administered during this visit: Ms. Muise had no medications administered during this visit.  Future Appointments Date Time Provider [place]  10/18/2016 10:30 AM Glean Hess, MD [facility] None    Primary Care Physician: Halina Maidens, MD Location: [facility] Note by: Kathlen Brunswick. Dossie Arbour, M.D, DABA, DABAPM, DABPM, DABIPP, FIPP  Pain Score Disclaimer: We use the NRS-11 scale. This is a self-reported, subjective measurement of pain severity with only modest accuracy. It is used primarily to identify changes within a particular patient. It must be understood that outpatient pain scales are significantly less accurate that those used for research, where they can be applied under ideal controlled circumstances with minimal exposure to variables. In reality, the score is likely to be a combination of pain  intensity and pain affect, where pain affect describes the degree of emotional arousal or changes in action readiness caused by the sensory experience of pain. Factors such as social and work situation, setting, emotional state, anxiety levels, expectation, and prior pain experience may influence pain perception and show large inter-individual differences that may also be affected by time variables.

## 2015-11-07 NOTE — Patient Instructions (Addendum)
Smoking Cessation, Tips for Success If you are ready to quit smoking, congratulations! You have chosen to help yourself be healthier. Cigarettes bring nicotine, tar, carbon monoxide, and other irritants into your body. Your lungs, heart, and blood vessels will be able to work better without these poisons. There are many different ways to quit smoking. Nicotine gum, nicotine patches, a nicotine inhaler, or nicotine nasal spray can help with physical craving. Hypnosis, support groups, and medicines help break the habit of smoking. WHAT THINGS CAN I DO TO MAKE QUITTING EASIER?  Here are some tips to help you quit for good:  Pick a date when you will quit smoking completely. Tell all of your friends and family about your plan to quit on that date.  Do not try to slowly cut down on the number of cigarettes you are smoking. Pick a quit date and quit smoking completely starting on that day.  Throw away all cigarettes.   Clean and remove all ashtrays from your home, work, and car.  On a card, write down your reasons for quitting. Carry the card with you and read it when you get the urge to smoke.  Cleanse your body of nicotine. Drink enough water and fluids to keep your urine clear or pale yellow. Do this after quitting to flush the nicotine from your body.  Learn to predict your moods. Do not let a bad situation be your excuse to have a cigarette. Some situations in your life might tempt you into wanting a cigarette.  Never have "just one" cigarette. It leads to wanting another and another. Remind yourself of your decision to quit.  Change habits associated with smoking. If you smoked while driving or when feeling stressed, try other activities to replace smoking. Stand up when drinking your coffee. Brush your teeth after eating. Sit in a different chair when you read the paper. Avoid alcohol while trying to quit, and try to drink fewer caffeinated beverages. Alcohol and caffeine may urge you to  smoke.  Avoid foods and drinks that can trigger a desire to smoke, such as sugary or spicy foods and alcohol.  Ask people who smoke not to smoke around you.  Have something planned to do right after eating or having a cup of coffee. For example, plan to take a walk or exercise.  Try a relaxation exercise to calm you down and decrease your stress. Remember, you may be tense and nervous for the first 2 weeks after you quit, but this will pass.  Find new activities to keep your hands busy. Play with a pen, coin, or rubber band. Doodle or draw things on paper.  Brush your teeth right after eating. This will help cut down on the craving for the taste of tobacco after meals. You can also try mouthwash.   Use oral substitutes in place of cigarettes. Try using lemon drops, carrots, cinnamon sticks, or chewing gum. Keep them handy so they are available when you have the urge to smoke.  When you have the urge to smoke, try deep breathing.  Designate your home as a nonsmoking area.  If you are a heavy smoker, ask your health care provider about a prescription for nicotine chewing gum. It can ease your withdrawal from nicotine.  Reward yourself. Set aside the cigarette money you save and buy yourself something nice.  Look for support from others. Join a support group or smoking cessation program. Ask someone at home or at work to help you with your plan   to quit smoking.  Always ask yourself, "Do I need this cigarette or is this just a reflex?" Tell yourself, "Today, I choose not to smoke," or "I do not want to smoke." You are reminding yourself of your decision to quit.  Do not replace cigarette smoking with electronic cigarettes (commonly called e-cigarettes). The safety of e-cigarettes is unknown, and some may contain harmful chemicals.  If you relapse, do not give up! Plan ahead and think about what you will do the next time you get the urge to smoke. HOW WILL I FEEL WHEN I QUIT SMOKING? You  may have symptoms of withdrawal because your body is used to nicotine (the addictive substance in cigarettes). You may crave cigarettes, be irritable, feel very hungry, cough often, get headaches, or have difficulty concentrating. The withdrawal symptoms are only temporary. They are strongest when you first quit but will go away within 10-14 days. When withdrawal symptoms occur, stay in control. Think about your reasons for quitting. Remind yourself that these are signs that your body is healing and getting used to being without cigarettes. Remember that withdrawal symptoms are easier to treat than the major diseases that smoking can cause.  Even after the withdrawal is over, expect periodic urges to smoke. However, these cravings are generally short lived and will go away whether you smoke or not. Do not smoke! WHAT RESOURCES ARE AVAILABLE TO HELP ME QUIT SMOKING? Your health care provider can direct you to community resources or hospitals for support, which may include:  Group support.  Education.  Hypnosis.  Therapy.   This information is not intended to replace advice given to you by your health care provider. Make sure you discuss any questions you have with your health care provider.   Document Released: 03/29/2004 Document Revised: 07/22/2014 Document Reviewed: 12/17/2012 Elsevier Interactive Patient Education 2016 [place] to get labwork drawn today in [facility] mall

## 2015-11-07 NOTE — Progress Notes (Signed)
Safety precautions to be maintained throughout the outpatient stay will include: orient to surroundings, keep bed in low position, maintain call bell within reach at all times, provide assistance with transfer out of bed and ambulation. Hydrocodone pill count # 26/90  Filled 10-13-15

## 2015-11-14 LAB — TOXASSURE SELECT 13 (MW), URINE: PDF: 0

## 2015-11-15 ENCOUNTER — Other Ambulatory Visit: Payer: Self-pay | Admitting: Internal Medicine

## 2015-11-15 MED ORDER — MIRTAZAPINE 15 MG PO TABS: 15.0000 mg | ORAL_TABLET | Freq: Every day | ORAL | Status: DC

## 2015-12-08 ENCOUNTER — Telehealth: Payer: Self-pay | Admitting: *Deleted

## 2015-12-08 NOTE — Telephone Encounter (Signed)
Made pt aware that on  01/23/16 she will need to come in at 10:20am instead of 1pm....TD

## 2015-12-13 DIAGNOSIS — F17200 Nicotine dependence, unspecified, uncomplicated: Secondary | ICD-10-CM | POA: Diagnosis not present

## 2015-12-13 DIAGNOSIS — I739 Peripheral vascular disease, unspecified: Secondary | ICD-10-CM | POA: Diagnosis not present

## 2015-12-13 DIAGNOSIS — I70212 Atherosclerosis of native arteries of extremities with intermittent claudication, left leg: Secondary | ICD-10-CM | POA: Diagnosis not present

## 2015-12-13 DIAGNOSIS — I10 Essential (primary) hypertension: Secondary | ICD-10-CM | POA: Diagnosis not present

## 2015-12-13 DIAGNOSIS — I714 Abdominal aortic aneurysm, without rupture: Secondary | ICD-10-CM | POA: Diagnosis not present

## 2015-12-13 DIAGNOSIS — I70213 Atherosclerosis of native arteries of extremities with intermittent claudication, bilateral legs: Secondary | ICD-10-CM | POA: Diagnosis not present

## 2015-12-13 DIAGNOSIS — E785 Hyperlipidemia, unspecified: Secondary | ICD-10-CM | POA: Diagnosis not present

## 2016-01-08 ENCOUNTER — Other Ambulatory Visit
Admission: RE | Admit: 2016-01-08 | Discharge: 2016-01-08 | Disposition: A | Discharge status: Home / Self Care | Payer: Medicare Other | Source: Ambulatory Visit | Attending: Pain Medicine | Admitting: Pain Medicine

## 2016-01-08 DIAGNOSIS — Z79891 Long term (current) use of opiate analgesic: Secondary | ICD-10-CM | POA: Diagnosis not present

## 2016-01-08 DIAGNOSIS — Z5181 Encounter for therapeutic drug level monitoring: Secondary | ICD-10-CM | POA: Insufficient documentation

## 2016-01-08 DIAGNOSIS — M79604 Pain in right leg: Secondary | ICD-10-CM | POA: Insufficient documentation

## 2016-01-08 DIAGNOSIS — G8929 Other chronic pain: Secondary | ICD-10-CM | POA: Insufficient documentation

## 2016-01-08 DIAGNOSIS — M791 Myalgia: Secondary | ICD-10-CM | POA: Insufficient documentation

## 2016-01-08 DIAGNOSIS — M545 Low back pain: Secondary | ICD-10-CM | POA: Insufficient documentation

## 2016-01-08 DIAGNOSIS — M810 Age-related osteoporosis without current pathological fracture: Secondary | ICD-10-CM | POA: Insufficient documentation

## 2016-01-08 DIAGNOSIS — K219 Gastro-esophageal reflux disease without esophagitis: Secondary | ICD-10-CM | POA: Diagnosis not present

## 2016-01-08 DIAGNOSIS — K5903 Drug induced constipation: Secondary | ICD-10-CM | POA: Insufficient documentation

## 2016-01-08 LAB — MAGNESIUM: MAGNESIUM: 2 mg/dL (ref 1.7–2.4)

## 2016-01-08 LAB — SEDIMENTATION RATE: SED RATE: 3 mm/h (ref 0–30)

## 2016-01-08 LAB — COMPREHENSIVE METABOLIC PANEL
ALBUMIN: 4.2 g/dL (ref 3.5–5.0)
ALT: 12 U/L — ABNORMAL LOW (ref 14–54)
ANION GAP: 9 (ref 5–15)
AST: 18 U/L (ref 15–41)
Alkaline Phosphatase: 82 U/L (ref 38–126)
BUN: 11 mg/dL (ref 6–20)
CALCIUM: 9.1 mg/dL (ref 8.9–10.3)
CHLORIDE: 102 mmol/L (ref 101–111)
CO2: 25 mmol/L (ref 22–32)
Creatinine, Ser: 0.91 mg/dL (ref 0.44–1.00)
GFR calc non Af Amer: 57 mL/min — ABNORMAL LOW (ref >60)
GLUCOSE: 116 mg/dL — AB (ref 65–99)
POTASSIUM: 4.2 mmol/L (ref 3.5–5.1)
SODIUM: 136 mmol/L (ref 135–145)
Total Bilirubin: 0.5 mg/dL (ref 0.3–1.2)
Total Protein: 7.9 g/dL (ref 6.5–8.1)

## 2016-01-08 LAB — C-REACTIVE PROTEIN: CRP: 1.1 mg/dL — ABNORMAL HIGH (ref <1.0)

## 2016-01-09 LAB — VITAMIN D 25 HYDROXY (VIT D DEFICIENCY, FRACTURES): Vit D, 25-Hydroxy: 5 ng/mL — ABNORMAL LOW (ref 30.0–100.0)

## 2016-01-10 ENCOUNTER — Ambulatory Visit: Payer: Medicare Other | Attending: Pain Medicine | Admitting: Pain Medicine

## 2016-01-10 ENCOUNTER — Encounter (INDEPENDENT_AMBULATORY_CARE_PROVIDER_SITE_OTHER): Payer: Self-pay

## 2016-01-10 ENCOUNTER — Encounter: Payer: Self-pay | Admitting: Pain Medicine

## 2016-01-10 VITALS — BP 159/61 | HR 63 | Temp 97.8°F | Resp 18 | Ht 64.0 in | Wt 113.0 lb

## 2016-01-10 DIAGNOSIS — E559 Vitamin D deficiency, unspecified: Secondary | ICD-10-CM | POA: Diagnosis not present

## 2016-01-10 DIAGNOSIS — M47896 Other spondylosis, lumbar region: Secondary | ICD-10-CM

## 2016-01-10 DIAGNOSIS — M4806 Spinal stenosis, lumbar region: Secondary | ICD-10-CM | POA: Diagnosis not present

## 2016-01-10 DIAGNOSIS — G8929 Other chronic pain: Secondary | ICD-10-CM | POA: Insufficient documentation

## 2016-01-10 DIAGNOSIS — M79606 Pain in leg, unspecified: Secondary | ICD-10-CM | POA: Diagnosis present

## 2016-01-10 DIAGNOSIS — G44229 Chronic tension-type headache, not intractable: Secondary | ICD-10-CM | POA: Insufficient documentation

## 2016-01-10 DIAGNOSIS — D509 Iron deficiency anemia, unspecified: Secondary | ICD-10-CM | POA: Diagnosis not present

## 2016-01-10 DIAGNOSIS — M545 Low back pain: Secondary | ICD-10-CM | POA: Diagnosis not present

## 2016-01-10 DIAGNOSIS — Z8673 Personal history of transient ischemic attack (TIA), and cerebral infarction without residual deficits: Secondary | ICD-10-CM | POA: Diagnosis not present

## 2016-01-10 DIAGNOSIS — Z8711 Personal history of peptic ulcer disease: Secondary | ICD-10-CM | POA: Diagnosis not present

## 2016-01-10 DIAGNOSIS — M79604 Pain in right leg: Secondary | ICD-10-CM | POA: Insufficient documentation

## 2016-01-10 DIAGNOSIS — K219 Gastro-esophageal reflux disease without esophagitis: Secondary | ICD-10-CM | POA: Insufficient documentation

## 2016-01-10 DIAGNOSIS — Z5181 Encounter for therapeutic drug level monitoring: Secondary | ICD-10-CM

## 2016-01-10 DIAGNOSIS — M5417 Radiculopathy, lumbosacral region: Secondary | ICD-10-CM

## 2016-01-10 DIAGNOSIS — J449 Chronic obstructive pulmonary disease, unspecified: Secondary | ICD-10-CM | POA: Diagnosis not present

## 2016-01-10 DIAGNOSIS — M5416 Radiculopathy, lumbar region: Secondary | ICD-10-CM

## 2016-01-10 DIAGNOSIS — Z79891 Long term (current) use of opiate analgesic: Secondary | ICD-10-CM | POA: Insufficient documentation

## 2016-01-10 DIAGNOSIS — E785 Hyperlipidemia, unspecified: Secondary | ICD-10-CM | POA: Insufficient documentation

## 2016-01-10 DIAGNOSIS — M4726 Other spondylosis with radiculopathy, lumbar region: Secondary | ICD-10-CM | POA: Diagnosis not present

## 2016-01-10 DIAGNOSIS — J438 Other emphysema: Secondary | ICD-10-CM | POA: Insufficient documentation

## 2016-01-10 DIAGNOSIS — M47816 Spondylosis without myelopathy or radiculopathy, lumbar region: Secondary | ICD-10-CM

## 2016-01-10 DIAGNOSIS — G2581 Restless legs syndrome: Secondary | ICD-10-CM | POA: Diagnosis not present

## 2016-01-10 DIAGNOSIS — M549 Dorsalgia, unspecified: Secondary | ICD-10-CM | POA: Diagnosis present

## 2016-01-10 DIAGNOSIS — F17210 Nicotine dependence, cigarettes, uncomplicated: Secondary | ICD-10-CM | POA: Insufficient documentation

## 2016-01-10 DIAGNOSIS — Z86718 Personal history of other venous thrombosis and embolism: Secondary | ICD-10-CM | POA: Insufficient documentation

## 2016-01-10 DIAGNOSIS — M791 Myalgia: Secondary | ICD-10-CM

## 2016-01-10 DIAGNOSIS — M7918 Myalgia, other site: Secondary | ICD-10-CM

## 2016-01-10 DIAGNOSIS — M48061 Spinal stenosis, lumbar region without neurogenic claudication: Secondary | ICD-10-CM

## 2016-01-10 MED ORDER — METHOCARBAMOL 750 MG PO TABS: 750.0000 mg | ORAL_TABLET | Freq: Three times a day (TID) | ORAL | Status: DC | PRN

## 2016-01-10 MED ORDER — VITAMIN D3 50 MCG (2000 UT) PO CAPS: ORAL_CAPSULE | ORAL | Status: DC

## 2016-01-10 MED ORDER — HYDROCODONE-ACETAMINOPHEN 5-325 MG PO TABS: 1.0000 | ORAL_TABLET | Freq: Three times a day (TID) | ORAL | Status: DC | PRN

## 2016-01-10 MED ORDER — ERGOCALCIFEROL 1.25 MG (50000 UT) PO CAPS: 50000.0000 [IU] | ORAL_CAPSULE | ORAL | Status: DC

## 2016-01-10 MED ORDER — MAGNESIUM-OXIDE 400 (241.3 MG) MG PO TABS: 1.0000 | ORAL_TABLET | Freq: Every day | ORAL | Status: DC

## 2016-01-10 NOTE — Patient Instructions (Signed)
Smoking Cessation, Tips for Success If you are ready to quit smoking, congratulations! You have chosen to help yourself be healthier. Cigarettes bring nicotine, tar, carbon monoxide, and other irritants into your body. Your lungs, heart, and blood vessels will be able to work better without these poisons. There are many different ways to quit smoking. Nicotine gum, nicotine patches, a nicotine inhaler, or nicotine nasal spray can help with physical craving. Hypnosis, support groups, and medicines help break the habit of smoking. WHAT THINGS CAN I DO TO MAKE QUITTING EASIER?  Here are some tips to help you quit for good:  Pick a date when you will quit smoking completely. Tell all of your friends and family about your plan to quit on that date.  Do not try to slowly cut down on the number of cigarettes you are smoking. Pick a quit date and quit smoking completely starting on that day.  Throw away all cigarettes.   Clean and remove all ashtrays from your home, work, and car.  On a card, write down your reasons for quitting. Carry the card with you and read it when you get the urge to smoke.  Cleanse your body of nicotine. Drink enough water and fluids to keep your urine clear or pale yellow. Do this after quitting to flush the nicotine from your body.  Learn to predict your moods. Do not let a bad situation be your excuse to have a cigarette. Some situations in your life might tempt you into wanting a cigarette.  Never have "just one" cigarette. It leads to wanting another and another. Remind yourself of your decision to quit.  Change habits associated with smoking. If you smoked while driving or when feeling stressed, try other activities to replace smoking. Stand up when drinking your coffee. Brush your teeth after eating. Sit in a different chair when you read the paper. Avoid alcohol while trying to quit, and try to drink fewer caffeinated beverages. Alcohol and caffeine may urge you to  smoke.  Avoid foods and drinks that can trigger a desire to smoke, such as sugary or spicy foods and alcohol.  Ask people who smoke not to smoke around you.  Have something planned to do right after eating or having a cup of coffee. For example, plan to take a walk or exercise.  Try a relaxation exercise to calm you down and decrease your stress. Remember, you may be tense and nervous for the first 2 weeks after you quit, but this will pass.  Find new activities to keep your hands busy. Play with a pen, coin, or rubber band. Doodle or draw things on paper.  Brush your teeth right after eating. This will help cut down on the craving for the taste of tobacco after meals. You can also try mouthwash.   Use oral substitutes in place of cigarettes. Try using lemon drops, carrots, cinnamon sticks, or chewing gum. Keep them handy so they are available when you have the urge to smoke.  When you have the urge to smoke, try deep breathing.  Designate your home as a nonsmoking area.  If you are a heavy smoker, ask your health care provider about a prescription for nicotine chewing gum. It can ease your withdrawal from nicotine.  Reward yourself. Set aside the cigarette money you save and buy yourself something nice.  Look for support from others. Join a support group or smoking cessation program. Ask someone at home or at work to help you with your plan   to quit smoking.  Always ask yourself, "Do I need this cigarette or is this just a reflex?" Tell yourself, "Today, I choose not to smoke," or "I do not want to smoke." You are reminding yourself of your decision to quit.  Do not replace cigarette smoking with electronic cigarettes (commonly called e-cigarettes). The safety of e-cigarettes is unknown, and some may contain harmful chemicals.  If you relapse, do not give up! Plan ahead and think about what you will do the next time you get the urge to smoke. HOW WILL I FEEL WHEN I QUIT SMOKING? You  may have symptoms of withdrawal because your body is used to nicotine (the addictive substance in cigarettes). You may crave cigarettes, be irritable, feel very hungry, cough often, get headaches, or have difficulty concentrating. The withdrawal symptoms are only temporary. They are strongest when you first quit but will go away within 10-14 days. When withdrawal symptoms occur, stay in control. Think about your reasons for quitting. Remind yourself that these are signs that your body is healing and getting used to being without cigarettes. Remember that withdrawal symptoms are easier to treat than the major diseases that smoking can cause.  Even after the withdrawal is over, expect periodic urges to smoke. However, these cravings are generally short lived and will go away whether you smoke or not. Do not smoke! WHAT RESOURCES ARE AVAILABLE TO HELP ME QUIT SMOKING? Your health care provider can direct you to community resources or hospitals for support, which may include:  Group support.  Education.  Hypnosis.  Therapy.   This information is not intended to replace advice given to you by your health care provider. Make sure you discuss any questions you have with your health care provider.   Document Released: 03/29/2004 Document Revised: 07/22/2014 Document Reviewed: 12/17/2012 [employer] Interactive Patient Education 2016 [employer]  

## 2016-01-10 NOTE — Progress Notes (Signed)
Patient's Name: Tasha Reyes  Patient type: Established  MRN: [id number]  Service setting: Ambulatory outpatient  DOB: 1931-02-18  Location: [facility]  DOS: 01/10/2016  Primary Care Physician: Halina Maidens, MD  Note by: Kathlen Brunswick. Dossie Arbour, M.D, DABA, [place], DABPM, Milagros Evener, [place]  Referring Physician: Glean Hess, MD  Specialty: Board-Certified Interventional Pain Management  Last Visit to Pain Management: 12/08/2015   Primary Reason(s) for Visit: Encounter for prescription drug management (Level of risk: moderate) CC: Back Pain and Leg Pain   HPI  Ms. Trowbridge is a 82 y.o. year old, female patient, who returns today as an established patient. She has Dyslipidemia; Hx of iron deficiency anemia; Compulsive tobacco user syndrome; Benign neoplasm; Chronic peptic ulcer with bleeding; Essential (primary) hypertension; Depression, major, recurrent, in partial remission ([place]); Alveolar emphysema of lung ([place]); Vascular disorder of lower extremity; Restless leg; Chronic tension-type headache, intractable; Chronic pain; Chronic low back pain (Location of Secondary source of pain) (Bilateral) (R>L); Chronic lower extremity pain (Location of Primary Source of Pain) (Right); Lumbosacral Radiculopathy (Right); Chronic radicular lumbar pain (Right); Long term current use of opiate analgesic; Long term prescription opiate use; Opiate use; History of abdominal aortic aneurysm (AAA) repair; Peripheral vascular disease ([place]); Lumbar spondylosis; Hx of long term use of blood thinners (Plavix); Lower extremity pain (Right); Chronic pain syndrome; Neurogenic pain; History of DVT (deep vein thrombosis); History of peptic ulcer disease; Lumbar facet hypertrophy; Facet syndrome, lumbar; Aneurysm of iliac artery ([place]); Lumbar foraminal stenosis (Right) (Severe at L4-5); Encounter for therapeutic drug level monitoring; Encounter for chronic pain management; Opioid-induced constipation  ([facility]IC); Osteoporosis, post-menopausal; Musculoskeletal pain; GERD (gastroesophageal reflux disease); and Vitamin D deficiency on her problem list.. Her primarily concern today is the Back Pain and Leg Pain   Pain Assessment: Self-Reported Pain Score: 3  (pain scale information given) Reported level is compatible with observation          The patient comes into the clinics today for pharmacological management of her chronic pain. I last saw this patient on 11/07/2015. The patient  reports that she does not use illicit drugs. Her body mass index is 19.39 kg/(m^2).  Date of Last Visit: 11/07/15 Service Provided on Last Visit: Med Refill  Controlled Substance Pharmacotherapy Assessment & REMS (Risk Evaluation and Mitigation Strategy)  Analgesic: Hydrocodone/APAP 5/325 one tablet every 8 hours (15 mg/day) MME/day: 15 mg/day Pill Count: Hydrocodone pill count # 29/90 Filled 12-21-15. Pharmacokinetics: Onset of action (Liberation/Absorption): Within expected pharmacological parameters Time to Peak effect (Distribution): Timing and results are as within normal expected parameters Duration of action (Metabolism/Excretion): Within normal limits for medication Pharmacodynamics: Analgesic Effect: More than 50% Activity Facilitation: Medication(s) allow patient to sit, stand, walk, and do the basic ADLs Perceived Effectiveness: Described as relatively effective, allowing for increase in activities of daily living (ADL) Side-effects or Adverse reactions: None reported Monitoring: [place] PMP: Online review of the past 50-monthperiod conducted. Compliant with practice rules and regulations Last UDS on record: TOXASSURE SELECT 13  Date Value Ref Range Status  11/07/2015 FINAL  Final    Comment:    ==================================================================== TOXASSURE SELECT 13 (MW) ==================================================================== Test                             Result        Flag       Units Drug Present and Declared for Prescription Verification   Hydrocodone  775          EXPECTED   ng/mg creat   Hydromorphone                  333          EXPECTED   ng/mg creat   Dihydrocodeine                 140          EXPECTED   ng/mg creat   Norhydrocodone                 968          EXPECTED   ng/mg creat    Sources of hydrocodone include scheduled prescription    medications. Hydromorphone, dihydrocodeine and norhydrocodone are    expected metabolites of hydrocodone. Hydromorphone and    dihydrocodeine are also available as scheduled prescription    medications. ==================================================================== Test                      Result    Flag   Units      Ref Range   Creatinine              104              mg/dL      >=20 ==================================================================== Declared Medications:  The flagging and interpretation on this report are based on the  following declared medications.  Unexpected results may arise from  inaccuracies in the declared medications.  **Note: The testing scope of this panel includes these medications:  Hydrocodone (Norco)  Hydrocodone (Vicodin)  **Note: The testing scope of this panel does not include following  reported medications:  Acetaminophen (Norco)  Acetaminophen (Vicodin)  Atenolol (Tenormin)  Atorvastatin (Lipitor)  Clopidogrel (Plavix)  Iron (Ferrous Sulfate)  Lubiprostone (Amitiza)  Magnesium (Mag-Ox)  Methocarbamol (Robaxin)  Mirtazapine (Remeron)  Pantoprazole (Protonix)  Supplement ==================================================================== For clinical consultation, please call [phone]. ====================================================================    UDS interpretation: Compliant          Medication Assessment Form: Reviewed. Patient indicates being compliant with therapy Treatment compliance: Compliant Risk  Assessment: Aberrant Behavior: None observed today Substance Use Disorder (SUD) Risk Level: No change since last visit Risk of opioid abuse or dependence: 0.7-3.0% with doses ? 36 MME/day and 6.1-26% with doses ? 120 MME/day. Opioid Risk Tool (ORT) Score: Total Score: 0 Low Risk for SUD (Score <3) Depression Scale Score: PHQ-2: PHQ-2 Total Score: 0 No depression (0) PHQ-9: PHQ-9 Total Score: 0 No depression (0-4)  Pharmacologic Plan: No change in therapy, at this time  Laboratory Chemistry  Inflammation Markers Lab Results  Component Value Date   ESRSEDRATE 3 01/08/2016   CRP 1.1* 01/08/2016    Renal Function Lab Results  Component Value Date   BUN 11 01/08/2016   CREATININE 0.91 01/08/2016   GFRAA >60 01/08/2016   GFRNONAA 57* 01/08/2016    Hepatic Function Lab Results  Component Value Date   AST 18 01/08/2016   ALT 12* 01/08/2016   ALBUMIN 4.2 01/08/2016    Electrolytes Lab Results  Component Value Date   NA 136 01/08/2016   K 4.2 01/08/2016   CL 102 01/08/2016   CALCIUM 9.1 01/08/2016   MG 2.0 01/08/2016    Pain Modulating Vitamins Lab Results  Component Value Date   VD25OH 5.0* 01/08/2016    Coagulation Parameters Lab Results  Component Value Date   INR 1.1 02/21/2013  LABPROT 14.2 02/21/2013   APTT 110.0* 11/15/2011   PLT 469* 10/13/2015    Note: Labs Reviewed.  Recent Diagnostic Imaging  No results found.  Meds  The patient has a current medication list which includes the following prescription(s): atenolol, atorvastatin, clopidogrel, ferrous sulfate, hydrocodone-acetaminophen, hydrocodone-acetaminophen, hydrocodone-acetaminophen, magnesium-oxide, methocarbamol, mirtazapine, pantoprazole, vitamin d3, and ergocalciferol.  Current Outpatient Prescriptions on File Prior to Visit  Medication Sig  . atenolol (TENORMIN) 25 MG tablet TAKE 1 TABLET BY MOUTH DAILY  . atorvastatin (LIPITOR) 10 MG tablet TAKE 1 TABLET BY MOUTH EVERY NIGHT AT BEDTIME   . clopidogrel (PLAVIX) 75 MG tablet Take 1 tablet by mouth daily.  . ferrous sulfate 325 (65 FE) MG EC tablet Take 1 tablet (325 mg total) by mouth daily.  . mirtazapine (REMERON) 15 MG tablet Take 1 tablet (15 mg total) by mouth at bedtime.  . pantoprazole (PROTONIX) 40 MG tablet TAKE 1 TABLET(40 MG) BY MOUTH TWICE DAILY   No current facility-administered medications on file prior to visit.    ROS  Constitutional: Denies any fever or chills Gastrointestinal: No reported hemesis, hematochezia, vomiting, or acute GI distress Musculoskeletal: Denies any acute onset joint swelling, redness, loss of ROM, or weakness Neurological: No reported episodes of acute onset apraxia, aphasia, dysarthria, agnosia, amnesia, paralysis, loss of coordination, or loss of consciousness  Allergies  Ms. Sitar has No Known Allergies.  [place]  Medical:  Ms. Wrenn  has a past medical history of [name]; Hypertension; COPD (chronic obstructive pulmonary disease) ([place]); Cystitis; Migraines; Stroke ([place]); [name]; Vascular disease; History of DVT (deep vein thrombosis) (05/11/2015); History of peptic ulcer disease (05/11/2015); and Aneurysm of iliac artery ([place]) (05/11/2015). Family: family history includes Brain cancer in her father; Kidney disease in her mother. Surgical:  has past surgical history that includes Partial hysterectomy; [name]; [name], 2008); Back surgery; Colonoscopy (2012); Esophagogastroduodenoscopy (04/2013); Abdominal hysterectomy; Appendectomy; and blood clot . Tobacco:  reports that she has been smoking Cigarettes.  She has a 30.5 pack-year smoking history. She does not have any smokeless tobacco history on file. Alcohol:  reports that she does not drink alcohol. Drug:  reports that she does not use illicit drugs.  Constitutional Exam  Vitals: Blood pressure 159/61, pulse  63, temperature 97.8 F (36.6 C), resp. rate 18, height '5\' 4"'$  (1.626 m), weight 113 lb (51.256 kg), SpO2 98 %. General appearance: Well nourished, well developed, and well hydrated. In no acute distress Calculated BMI/Body habitus: Body mass index is 19.39 kg/(m^2). (18.5-24.9 kg/m2) Ideal body weight Psych/Mental status: Alert and oriented x 3 (person, place, & time) Eyes: PERLA Respiratory: No evidence of acute respiratory distress  Cervical Spine Exam  Inspection: No masses, redness, or swelling Alignment: Symmetrical ROM: Functional: ROM is within functional limits [facility]) Stability: No instability detected Muscle strength & Tone: Functionally intact Sensory: Unimpaired Palpation: No complaints of tenderness  Upper Extremity (UE) Exam    Side: Right upper extremity  Side: Left upper extremity  Inspection: No masses, redness, swelling, or asymmetry  Inspection: No masses, redness, swelling, or asymmetry  ROM:  ROM:  Functional: ROM is within functional limits [facility])  Functional: ROM is within functional limits [facility])  Muscle strength & Tone: Functionally intact  Muscle strength & Tone: Functionally intact  Sensory: Unimpaired  Sensory: Unimpaired  Palpation: Non-contributory  Palpation: Non-contributory   Thoracic Spine Exam  Inspection: No masses, redness, or swelling Alignment: Symmetrical ROM: Functional: ROM is  within functional limits [facility]) Stability: No instability detected Sensory: Unimpaired Muscle strength & Tone: Functionally intact Palpation: No complaints of tenderness  Lumbar Spine Exam  Inspection: No masses, redness, or swelling Alignment: Symmetrical ROM: Functional: ROM is within functional limits [facility]) Stability: No instability detected Muscle strength & Tone: Functionally intact Sensory: Unimpaired Palpation: No complaints of tenderness Provocative Tests: Lumbar Hyperextension and rotation test: deferred Patrick's Maneuver: deferred  Gait & Posture  Assessment  Ambulation: Unassisted Gait: Unaffected Posture: WNL  Lower Extremity Exam    Side: Right lower extremity  Side: Left lower extremity  Inspection: No masses, redness, swelling, or asymmetry ROM:  Inspection: No masses, redness, swelling, or asymmetry ROM:  Functional: ROM is within functional limits [facility])  Functional: ROM is within functional limits [facility])  Muscle strength & Tone: Functionally intact  Muscle strength & Tone: Functionally intact  Sensory: Unimpaired  Sensory: Unimpaired  Palpation: Non-contributory  Palpation: Non-contributory   Assessment & Plan  Primary Diagnosis & Pertinent Problem List: The primary encounter diagnosis was Chronic pain. Diagnoses of Encounter for therapeutic drug level monitoring, Long term current use of opiate analgesic, Chronic low back pain (Location of Secondary source of pain) (Bilateral) (R>L), Chronic lower extremity pain (Location of Primary Source of Pain) (Right), Lumbar foraminal stenosis (Right) (Severe at L4-5), Musculoskeletal pain, Gastroesophageal reflux disease without esophagitis, Vitamin D deficiency, Chronic radicular lumbar pain (Right), Facet syndrome, lumbar, Lower extremity pain (Right), Lumbar facet hypertrophy, Lumbar spondylosis, unspecified spinal osteoarthritis, and Lumbosacral Radiculopathy (Right) were also pertinent to this visit.  Visit Diagnosis: 1. Chronic pain   2. Encounter for therapeutic drug level monitoring   3. Long term current use of opiate analgesic   4. Chronic low back pain (Location of Secondary source of pain) (Bilateral) (R>L)   5. Chronic lower extremity pain (Location of Primary Source of Pain) (Right)   6. Lumbar foraminal stenosis (Right) (Severe at L4-5)   7. Musculoskeletal pain   8. Gastroesophageal reflux disease without esophagitis   9. Vitamin D deficiency   10. Chronic radicular lumbar pain (Right)   11. Facet syndrome, lumbar   12. Lower extremity pain (Right)   13. Lumbar  facet hypertrophy   14. Lumbar spondylosis, unspecified spinal osteoarthritis   15. Lumbosacral Radiculopathy (Right)     Problems updated and reviewed during this visit: No problems updated.  Problem-specific Plan(s): No problem-specific assessment & plan notes found for this encounter.  No new assessment & plan notes have been filed under this hospital service since the last note was generated. Service: Pain Management   Plan of Care   Problem List Items Addressed This Visit      High   Chronic low back pain (Location of Secondary source of pain) (Bilateral) (R>L) (Chronic)   Relevant Medications   methocarbamol (ROBAXIN) 750 MG tablet   HYDROcodone-acetaminophen (NORCO/VICODIN) 5-325 MG tablet   HYDROcodone-acetaminophen (NORCO/VICODIN) 5-325 MG tablet   HYDROcodone-acetaminophen (NORCO/VICODIN) 5-325 MG tablet   Other Relevant Orders   LUMBAR FACET(MEDIAL BRANCH NERVE BLOCK) MBNB   Chronic lower extremity pain (Location of Primary Source of Pain) (Right) (Chronic)   Relevant Medications   methocarbamol (ROBAXIN) 750 MG tablet   HYDROcodone-acetaminophen (NORCO/VICODIN) 5-325 MG tablet   HYDROcodone-acetaminophen (NORCO/VICODIN) 5-325 MG tablet   HYDROcodone-acetaminophen (NORCO/VICODIN) 5-325 MG tablet   Other Relevant Orders   Lumbar Transforaminal epidural without steroid   LUMBAR EPIDURAL STEROID INJECTION   Chronic pain - Primary (Chronic)   Relevant Medications   methocarbamol (ROBAXIN) 750 MG tablet  HYDROcodone-acetaminophen (NORCO/VICODIN) 5-325 MG tablet   HYDROcodone-acetaminophen (NORCO/VICODIN) 5-325 MG tablet   HYDROcodone-acetaminophen (NORCO/VICODIN) 5-325 MG tablet   Chronic radicular lumbar pain (Right) (Chronic)   Relevant Orders   Lumbar Transforaminal epidural without steroid   LUMBAR EPIDURAL STEROID INJECTION   Facet syndrome, lumbar (Chronic)   Relevant Medications   methocarbamol (ROBAXIN) 750 MG tablet   HYDROcodone-acetaminophen  (NORCO/VICODIN) 5-325 MG tablet   HYDROcodone-acetaminophen (NORCO/VICODIN) 5-325 MG tablet   HYDROcodone-acetaminophen (NORCO/VICODIN) 5-325 MG tablet   Other Relevant Orders   LUMBAR FACET(MEDIAL BRANCH NERVE BLOCK) MBNB   Lower extremity pain (Right) (Chronic)   Relevant Orders   Lumbar Transforaminal epidural without steroid   LUMBAR EPIDURAL STEROID INJECTION   Lumbar facet hypertrophy (Chronic)   Relevant Orders   LUMBAR FACET(MEDIAL BRANCH NERVE BLOCK) MBNB   Lumbar foraminal stenosis (Right) (Severe at L4-5) (Chronic)   Relevant Orders   Lumbar Transforaminal epidural without steroid   Lumbar spondylosis (Chronic)   Relevant Medications   methocarbamol (ROBAXIN) 750 MG tablet   HYDROcodone-acetaminophen (NORCO/VICODIN) 5-325 MG tablet   HYDROcodone-acetaminophen (NORCO/VICODIN) 5-325 MG tablet   HYDROcodone-acetaminophen (NORCO/VICODIN) 5-325 MG tablet   Other Relevant Orders   Lumbar Transforaminal epidural without steroid   LUMBAR EPIDURAL STEROID INJECTION   LUMBAR FACET(MEDIAL BRANCH NERVE BLOCK) MBNB   Lumbosacral Radiculopathy (Right) (Chronic)   Relevant Medications   methocarbamol (ROBAXIN) 750 MG tablet   Other Relevant Orders   Lumbar Transforaminal epidural without steroid   LUMBAR EPIDURAL STEROID INJECTION   Musculoskeletal pain (Chronic)   Relevant Medications   methocarbamol (ROBAXIN) 750 MG tablet     Medium   Encounter for therapeutic drug level monitoring   Long term current use of opiate analgesic (Chronic)     Low   GERD (gastroesophageal reflux disease)   Relevant Medications   MAGNESIUM-OXIDE 400 (241.3 Mg) MG tablet   Vitamin D deficiency   Relevant Medications   Cholecalciferol (VITAMIN D3) 2000 units capsule   ergocalciferol (VITAMIN D2) 50000 units capsule       Pharmacotherapy (Medications Ordered): Meds ordered this encounter  Medications  . methocarbamol (ROBAXIN) 750 MG tablet    Sig: Take 1 tablet (750 mg total) by mouth 3  (three) times daily as needed for muscle spasms.    Dispense:  90 tablet    Refill:  2    Do not add this medication to the electronic "Automatic Refill" notification system. Patient may have prescription filled one day early if pharmacy is closed on scheduled refill date.  Marland Kitchen MAGNESIUM-OXIDE 400 (241.3 Mg) MG tablet    Sig: Take 1 tablet (400 mg total) by mouth daily. Reported on 11/07/2015    Dispense:  30 tablet    Refill:  2    Do not add this medication to the electronic "Automatic Refill" notification system. Patient may have prescription filled one day early if pharmacy is closed on scheduled refill date.  Marland Kitchen HYDROcodone-acetaminophen (NORCO/VICODIN) 5-325 MG tablet    Sig: Take 1 tablet by mouth every 8 (eight) hours as needed for severe pain.    Dispense:  90 tablet    Refill:  0    Do not place this medication, or any other prescription from our practice, on "Automatic Refill". Patient may have prescription filled one day early if pharmacy is closed on scheduled refill date. Do not fill until: 02/05/16 To last until: 03/06/16  . HYDROcodone-acetaminophen (NORCO/VICODIN) 5-325 MG tablet    Sig: Take 1 tablet by mouth every 8 (eight)  hours as needed for severe pain.    Dispense:  90 tablet    Refill:  0    Do not place this medication, or any other prescription from our practice, on "Automatic Refill". Patient may have prescription filled one day early if pharmacy is closed on scheduled refill date. Do not fill until: 03/06/16 To last until: 04/05/16  . HYDROcodone-acetaminophen (NORCO/VICODIN) 5-325 MG tablet    Sig: Take 1 tablet by mouth every 8 (eight) hours as needed for severe pain.    Dispense:  90 tablet    Refill:  0    Do not place this medication, or any other prescription from our practice, on "Automatic Refill". Patient may have prescription filled one day early if pharmacy is closed on scheduled refill date. Do not fill until: 04/05/16 To last until: 05/05/16  .  Cholecalciferol (VITAMIN D3) 2000 units capsule    Sig: Take 1 capsule (2,000 Units total) by mouth daily.    Dispense:  30 capsule    Refill:  PRN    Do not place this medication, or any other prescription from our practice, on "Automatic Refill".  . ergocalciferol (VITAMIN D2) 50000 units capsule    Sig: Take 1 capsule (50,000 Units total) by mouth 2 (two) times a week. X 6 weeks.    Dispense:  12 capsule    Refill:  0    Do not add this medication to the electronic "Automatic Refill" notification system. Patient may have prescription filled one day early if pharmacy is closed on scheduled refill date.    Lab-work & Procedure Ordered: Orders Placed This Encounter  Procedures  . Lumbar Transforaminal epidural without steroid  . LUMBAR EPIDURAL STEROID INJECTION  . LUMBAR FACET(MEDIAL BRANCH NERVE BLOCK) MBNB    Imaging Ordered: None  Interventional Therapies: Scheduled:  None at this time.    Considering:   1. Diagnostic right L4-5 transforaminal epidural steroid injection under fluoroscopic guidance, with a without sedation.  2. Diagnostic right L4-5 lumbar epidural steroid injection under fluoroscopic guidance, with a without sedation.  3. Diagnostic bilateral lumbar facet block under fluoroscopic guidance and IV sedation.  4. Possible bilateral lumbar facet radiofrequency ablation under fluoroscopic guidance and IV sedation.    PRN Procedures:   1. Diagnostic right L4-5 transforaminal epidural steroid injection under fluoroscopic guidance, with a without sedation.  2. Diagnostic right L4-5 lumbar epidural steroid injection under fluoroscopic guidance, with a without sedation.  3. Diagnostic bilateral lumbar facet block under fluoroscopic guidance and IV sedation.    Referral(s) or Consult(s): None at this time.  New Prescriptions   CHOLECALCIFEROL (VITAMIN D3) 2000 UNITS CAPSULE    Take 1 capsule (2,000 Units total) by mouth daily.   ERGOCALCIFEROL (VITAMIN D2) 50000  UNITS CAPSULE    Take 1 capsule (50,000 Units total) by mouth 2 (two) times a week. X 6 weeks.    Medications administered during this visit: Ms. Scurlock had no medications administered during this visit.  Requested PM Follow-up: Return in about 3 months (around 04/15/2016) for Medication Management, (3-Mo), Procedure (PRN - Patient will call).  Future Appointments Date Time Provider [place]  04/15/2016 1:20 PM Milinda Pointer, MD [facility] None  10/18/2016 10:30 AM Glean Hess, MD [facility] None    Primary Care Physician: Halina Maidens, MD Location: [facility] Note by: Kathlen Brunswick. Dossie Arbour, M.D, DABA, DABAPM, DABPM, DABIPP, FIPP  Pain Score Disclaimer: We use the NRS-11 scale. This is a self-reported, subjective measurement of  pain severity with only modest accuracy. It is used primarily to identify changes within a particular patient. It must be understood that outpatient pain scales are significantly less accurate that those used for research, where they can be applied under ideal controlled circumstances with minimal exposure to variables. In reality, the score is likely to be a combination of pain intensity and pain affect, where pain affect describes the degree of emotional arousal or changes in action readiness caused by the sensory experience of pain. Factors such as social and work situation, setting, emotional state, anxiety levels, expectation, and prior pain experience may influence pain perception and show large inter-individual differences that may also be affected by time variables.  Patient instructions provided during this appointment: Patient Instructions  Smoking Cessation, Tips for Success If you are ready to quit smoking, congratulations! You have chosen to help yourself be healthier. Cigarettes bring nicotine, tar, carbon monoxide, and other irritants into your body. Your lungs, heart, and blood vessels will be able to work  better without these poisons. There are many different ways to quit smoking. Nicotine gum, nicotine patches, a nicotine inhaler, or nicotine nasal spray can help with physical craving. Hypnosis, support groups, and medicines help break the habit of smoking. WHAT THINGS CAN I DO TO MAKE QUITTING EASIER?  Here are some tips to help you quit for good:  Pick a date when you will quit smoking completely. Tell all of your friends and family about your plan to quit on that date.  Do not try to slowly cut down on the number of cigarettes you are smoking. Pick a quit date and quit smoking completely starting on that day.  Throw away all cigarettes.   Clean and remove all ashtrays from your home, work, and car.  On a card, write down your reasons for quitting. Carry the card with you and read it when you get the urge to smoke.  Cleanse your body of nicotine. Drink enough water and fluids to keep your urine clear or pale yellow. Do this after quitting to flush the nicotine from your body.  Learn to predict your moods. Do not let a bad situation be your excuse to have a cigarette. Some situations in your life might tempt you into wanting a cigarette.  Never have "just one" cigarette. It leads to wanting another and another. Remind yourself of your decision to quit.  Change habits associated with smoking. If you smoked while driving or when feeling stressed, try other activities to replace smoking. Stand up when drinking your coffee. Brush your teeth after eating. Sit in a different chair when you read the paper. Avoid alcohol while trying to quit, and try to drink fewer caffeinated beverages. Alcohol and caffeine may urge you to smoke.  Avoid foods and drinks that can trigger a desire to smoke, such as sugary or spicy foods and alcohol.  Ask people who smoke not to smoke around you.  Have something planned to do right after eating or having a cup of coffee. For example, plan to take a walk or  exercise.  Try a relaxation exercise to calm you down and decrease your stress. Remember, you may be tense and nervous for the first 2 weeks after you quit, but this will pass.  Find new activities to keep your hands busy. Play with a pen, coin, or rubber band. Doodle or draw things on paper.  Brush your teeth right after eating. This will help cut down on the craving for the  taste of tobacco after meals. You can also try mouthwash.   Use oral substitutes in place of cigarettes. Try using lemon drops, carrots, cinnamon sticks, or chewing gum. Keep them handy so they are available when you have the urge to smoke.  When you have the urge to smoke, try deep breathing.  Designate your home as a nonsmoking area.  If you are a heavy smoker, ask your health care provider about a prescription for nicotine chewing gum. It can ease your withdrawal from nicotine.  Reward yourself. Set aside the cigarette money you save and buy yourself something nice.  Look for support from others. Join a support group or smoking cessation program. Ask someone at home or at work to help you with your plan to quit smoking.  Always ask yourself, "Do I need this cigarette or is this just a reflex?" Tell yourself, "Today, I choose not to smoke," or "I do not want to smoke." You are reminding yourself of your decision to quit.  Do not replace cigarette smoking with electronic cigarettes (commonly called e-cigarettes). The safety of e-cigarettes is unknown, and some may contain harmful chemicals.  If you relapse, do not give up! Plan ahead and think about what you will do the next time you get the urge to smoke. HOW WILL I FEEL WHEN I QUIT SMOKING? You may have symptoms of withdrawal because your body is used to nicotine (the addictive substance in cigarettes). You may crave cigarettes, be irritable, feel very hungry, cough often, get headaches, or have difficulty concentrating. The withdrawal symptoms are only temporary.  They are strongest when you first quit but will go away within 10-14 days. When withdrawal symptoms occur, stay in control. Think about your reasons for quitting. Remind yourself that these are signs that your body is healing and getting used to being without cigarettes. Remember that withdrawal symptoms are easier to treat than the major diseases that smoking can cause.  Even after the withdrawal is over, expect periodic urges to smoke. However, these cravings are generally short lived and will go away whether you smoke or not. Do not smoke! WHAT RESOURCES ARE AVAILABLE TO HELP ME QUIT SMOKING? Your health care provider can direct you to community resources or hospitals for support, which may include:  Group support.  Education.  Hypnosis.  Therapy.   This information is not intended to replace advice given to you by your health care provider. Make sure you discuss any questions you have with your health care provider.   Document Released: 03/29/2004 Document Revised: 07/22/2014 Document Reviewed: 12/17/2012 Elsevier Interactive Patient Education [employer].

## 2016-01-10 NOTE — Progress Notes (Signed)
Safety precautions to be maintained throughout the outpatient stay will include: orient to surroundings, keep bed in low position, maintain call bell within reach at all times, provide assistance with transfer out of bed and ambulation. Hydrocodone pill count # [phone]  Filled 12-21-15

## 2016-01-22 ENCOUNTER — Other Ambulatory Visit: Payer: Self-pay | Admitting: Pain Medicine

## 2016-01-22 DIAGNOSIS — E559 Vitamin D deficiency, unspecified: Secondary | ICD-10-CM

## 2016-01-22 NOTE — Progress Notes (Signed)
Quick Note:  Low Vitamin D Results Normal levels: between 30 and 100 ng/mL. Vitamin D Insufficiency: Levels between 20-30 ng/ml are defined as a "Vitamin D insufficiency". Vitamin D Deficiency: Levels below 20 ng/ml, is diagnosed as a "Vitamin D Deficiency".  Common causes include: dietary insufficiency; inadequate sun exposure; inability to absorb vitamin D from the intestines; or inability to process it due to kidney or liver disease. Low 25-hydroxyvitamin D: A low blood level of 25-hydroxyvitamin D may mean that a person is not getting enough exposure to sunlight or enough dietary vitamin D to meet his or her body's demand or that there is a problem with its absorption from the intestines. Occasionally, drugs used to treat seizures, particularly phenytoin (Dilantin), can interfere with the production of 25-hydroxyvitamin D in the liver. There is some evidence that vitamin D deficiency may increase the risk of some cancers, immune diseases, and cardiovascular disease. Low 1,25-dihydroxyvitamin D: A low level of 1,25-dihydroxyvitamin D can be seen in kidney disease and is one of the earliest changes to occur in persons with early kidney failure. Associated complications may include: hypocalcemia, hypophosphatemia, and reduced bone density. Associated symptoms: Vitamin D deficiencies and insufficiencies may be associated with fatigue, weakness, bone pain, joint pain, and muscle pain. Recommendations: Patient may benefit from taking over-the-counter Vitamin D3 supplements. I recommend a vitamin D + Calcium supplements. "Natures Bounty", a brand easily found in most pharmacies, has a formulation containing Calcium 1200 mg plus Vitamin D3 1000 IU, in Softgels capsules that are easy to swallow. This should be taken once a day, preferably in the morning as vitamin D will increase energy levels and make it difficult to fall asleep, if taken at night. Patients with levels lower than 20 ng/ml should contact  their primary care physicians to receive replacement therapy. Vitamin D3 can be obtained over-the-counter, without a prescription. Vitamin D2 requires a prescription and it is used for replacement therapy.  Normal levels of C-Reactive Protein for our Lab are less than 1.0 mg/L. C-reactive protein (CRP) is produced by the liver. The level of CRP rises when there is inflammation throughout the body. CRP goes up in response to inflammation. High levels suggests the presence of chronic inflammation but do not identify its location or cause. High levels have been observed in obese patients, individuals with bacterial infections, chronic inflammation, or flare-ups of inflammatory conditions. Drops of previously elevated levels suggest that the inflammation or infection is subsiding and/or responding to treatment.  Normal fasting (NPO x 8 hours) glucose levels are between 65-99 mg/dl, with 2 hour fasting, levels are usually less than 140 mg/dl. Any random blood glucose level greater than 200 mg/dl is considered to be Diabetes.  While most low ALT level results indicate a normal healthy liver, that may not always be the case. A low-functioning or non-functioning liver, lacking normal levels of ALT activity to begin with, would not release a lot of ALT into the blood when damaged. People infected with the hepatitis C virus initially show high ALT levels in their blood, but these levels fall over time. Because the ALT test measures ALT levels at only one point in time, people with chronic hepatitis C infection may already have experienced the ALT peak well before blood was drawn for the ALT test. Urinary tract infections or malnutrition may also cause low blood ALT levels. eGFR (Estimated Glomerular Filtration Rate) results are reported as milliliters/minute/1.70m (mL/min/1.767m. Because some laboratories do not collect information on a patient's race when the sample  is collected for testing, they may report  calculated results for both African Americans and non-African Americans.  The [employer] [facility]) suggests only reporting actual results once values are < 60 mL/min. 1. Normal values: 90-120 mL/min 2. Below 60 mL/min suggests that some kidney damage has occurred. 3. Between 24 and 30 indicate (Moderate) Stage 3 kidney disease. 4. Between 29 and 15 represent (Severe) Stage 4 kidney disease. 5. Less than 15 is considered (Kidney Failure) Stage 5. ______

## 2016-01-23 ENCOUNTER — Encounter: Payer: Medicare Other | Admitting: Pain Medicine

## 2016-01-30 ENCOUNTER — Other Ambulatory Visit: Payer: Self-pay | Admitting: Internal Medicine

## 2016-02-16 ENCOUNTER — Other Ambulatory Visit: Payer: Self-pay | Admitting: Pain Medicine

## 2016-02-16 DIAGNOSIS — K219 Gastro-esophageal reflux disease without esophagitis: Secondary | ICD-10-CM

## 2016-02-18 ENCOUNTER — Other Ambulatory Visit: Payer: Self-pay | Admitting: Pain Medicine

## 2016-02-18 DIAGNOSIS — E559 Vitamin D deficiency, unspecified: Secondary | ICD-10-CM

## 2016-04-07 ENCOUNTER — Other Ambulatory Visit: Payer: Self-pay | Admitting: Internal Medicine

## 2016-04-15 ENCOUNTER — Other Ambulatory Visit
Admission: RE | Admit: 2016-04-15 | Discharge: 2016-04-15 | Disposition: A | Discharge status: Home / Self Care | Payer: Medicare Other | Source: Ambulatory Visit | Attending: Pain Medicine | Admitting: Pain Medicine

## 2016-04-15 ENCOUNTER — Ambulatory Visit: Payer: Medicare Other | Attending: Pain Medicine | Admitting: Pain Medicine

## 2016-04-15 ENCOUNTER — Encounter: Payer: Self-pay | Admitting: Pain Medicine

## 2016-04-15 VITALS — BP 155/54 | HR 61 | Temp 98.3°F | Resp 18 | Ht 64.0 in | Wt 114.0 lb

## 2016-04-15 DIAGNOSIS — E559 Vitamin D deficiency, unspecified: Secondary | ICD-10-CM | POA: Diagnosis not present

## 2016-04-15 DIAGNOSIS — G8929 Other chronic pain: Secondary | ICD-10-CM

## 2016-04-15 DIAGNOSIS — T402X5A Adverse effect of other opioids, initial encounter: Secondary | ICD-10-CM

## 2016-04-15 DIAGNOSIS — M79604 Pain in right leg: Secondary | ICD-10-CM

## 2016-04-15 DIAGNOSIS — F1190 Opioid use, unspecified, uncomplicated: Secondary | ICD-10-CM

## 2016-04-15 DIAGNOSIS — K5903 Drug induced constipation: Secondary | ICD-10-CM

## 2016-04-15 DIAGNOSIS — M791 Myalgia: Secondary | ICD-10-CM

## 2016-04-15 DIAGNOSIS — K219 Gastro-esophageal reflux disease without esophagitis: Secondary | ICD-10-CM

## 2016-04-15 DIAGNOSIS — M7918 Myalgia, other site: Secondary | ICD-10-CM

## 2016-04-15 DIAGNOSIS — Z79891 Long term (current) use of opiate analgesic: Secondary | ICD-10-CM

## 2016-04-15 MED ORDER — ERGOCALCIFEROL 1.25 MG (50000 UT) PO CAPS: 50000.0000 [IU] | ORAL_CAPSULE | ORAL | 0 refills | Status: DC

## 2016-04-15 MED ORDER — HYDROCODONE-ACETAMINOPHEN 5-325 MG PO TABS: 1.0000 | ORAL_TABLET | Freq: Three times a day (TID) | ORAL | 0 refills | Status: DC | PRN

## 2016-04-15 MED ORDER — BENEFIBER PO POWD: ORAL | 2 refills | Status: DC

## 2016-04-15 MED ORDER — METHOCARBAMOL 750 MG PO TABS: 750.0000 mg | ORAL_TABLET | Freq: Three times a day (TID) | ORAL | 2 refills | Status: DC | PRN

## 2016-04-15 MED ORDER — MAGNESIUM-OXIDE 400 (241.3 MG) MG PO TABS: 1.0000 | ORAL_TABLET | Freq: Every day | ORAL | 0 refills | Status: DC

## 2016-04-15 NOTE — Progress Notes (Signed)
Bottle labeled hydrocodone/apap #[id number] last filled 02/20/16

## 2016-04-15 NOTE — Progress Notes (Signed)
Safety precautions to be maintained throughout the outpatient stay will include: orient to surroundings, keep bed in low position, maintain call bell within reach at all times, provide assistance with transfer out of bed and ambulation.  

## 2016-04-15 NOTE — Progress Notes (Signed)
Patient's Name: Tasha Reyes  MRN: [id number]  Referring Provider: Glean Hess, MD  DOB: Jun 21, 1931  PCP: Halina Maidens, MD  DOS: 04/15/2016  Note by: Kathlen Brunswick. Dossie Arbour, MD  Service setting: Ambulatory outpatient  Specialty: Interventional Pain Management  Location: [facility] (AMB) Pain Management Facility    Patient type: Established   Primary Reason(s) for Visit: Encounter for prescription drug management (Level of risk: moderate) CC: Back Pain (lower back right side )  HPI  Tasha Reyes is a 82 y.o. year old, female patient, who comes today for an initial evaluation. She has Dyslipidemia; Hx of iron deficiency anemia; Compulsive tobacco user syndrome; Benign neoplasm; Chronic peptic ulcer with bleeding; Essential (primary) hypertension; Depression, major, recurrent, in partial remission ([place]); Alveolar emphysema of lung ([place]); Vascular disorder of lower extremity; Restless leg; Chronic tension-type headache, intractable; Chronic pain; Chronic low back pain (Location of Secondary source of pain) (Bilateral) (R>L); Chronic lower extremity pain (Location of Primary Source of Pain) (Right); Lumbosacral Radiculopathy (Right); Chronic radicular lumbar pain (Right); Long term current use of opiate analgesic; Long term prescription opiate use; Opiate use; History of abdominal aortic aneurysm (AAA) repair; Peripheral vascular disease ([place]); Lumbar spondylosis; Hx of long term use of blood thinners (Plavix); Lower extremity pain (Right); Chronic pain syndrome; Neurogenic pain; History of DVT (deep vein thrombosis); History of peptic ulcer disease; Lumbar facet hypertrophy; Facet syndrome, lumbar; Aneurysm of iliac artery ([place]); Lumbar foraminal stenosis (Right) (Severe at L4-5); Encounter for therapeutic drug level monitoring; Encounter for chronic pain management; Opioid-induced constipation (OIC); Osteoporosis, post-menopausal; Musculoskeletal pain; GERD (gastroesophageal reflux disease); and Vitamin  D deficiency on her problem list.. Her primarily concern today is the Back Pain (lower back right side )  Pain Assessment: Self-Reported Pain Score: 2 /10             Reported level is compatible with observation.       Pain Type: Chronic pain Pain Location: Back (lower) Pain Orientation: Right, Lower Pain Descriptors / Indicators: Aching Pain Frequency: Constant  The patient comes into the clinics today for pharmacological management of her chronic pain. I last saw this patient on 02/18/2016. The patient  reports that she does not use drugs. Her body mass index is 19.57 kg/m. She recently completed her vitamin D2 replacement. Today we will check the levels again since she started so low. If it comes back low again, we will have to repeat the therapy with the vitamin D2.  Date of Last Visit: 01/10/16 Service Provided on Last Visit: Med Refill  Controlled Substance Pharmacotherapy Assessment & REMS (Risk Evaluation and Mitigation Strategy)  Analgesic: Hydrocodone/APAP 5/325 one tablet every 8 hours (15 mg/day) MME/day: 15 mg/day Pill Count: Bottle labeled hydrocodone/apap #59/90 last filled 02/20/16. Pharmacokinetics: Onset of action (Liberation/Absorption): Within expected pharmacological parameters Time to Peak effect (Distribution): Timing and results are as within normal expected parameters Duration of action (Metabolism/Excretion): Within normal limits for medication Pharmacodynamics: Analgesic Effect: More than 50% Activity Facilitation: Medication(s) allow patient to sit, stand, walk, and do the basic ADLs Perceived Effectiveness: Described as relatively effective, allowing for increase in activities of daily living (ADL) Side-effects or Adverse reactions: None reported Monitoring: [place] PMP: Online review of the past 71-monthperiod conducted. Compliant with practice rules and regulations List of all UDS test(s) done:  Lab Results  Component Value Date   TOXASSSELUR FINAL  11/07/2015   TOXASSSELUR FINAL 08/09/2015   TOXASSSELUR FINAL 05/11/2015   Last UDS on record: ToxAssure Select 13  Date  Value Ref Range Status  11/07/2015 FINAL  Final    Comment:    ==================================================================== TOXASSURE SELECT 13 (MW) ==================================================================== Test                             Result       Flag       Units Drug Present and Declared for Prescription Verification   Hydrocodone                    775          EXPECTED   ng/mg creat   Hydromorphone                  333          EXPECTED   ng/mg creat   Dihydrocodeine                 140          EXPECTED   ng/mg creat   Norhydrocodone                 968          EXPECTED   ng/mg creat    Sources of hydrocodone include scheduled prescription    medications. Hydromorphone, dihydrocodeine and norhydrocodone are    expected metabolites of hydrocodone. Hydromorphone and    dihydrocodeine are also available as scheduled prescription    medications. ==================================================================== Test                      Result    Flag   Units      Ref Range   Creatinine              104              mg/dL      >=20 ==================================================================== Declared Medications:  The flagging and interpretation on this report are based on the  following declared medications.  Unexpected results may arise from  inaccuracies in the declared medications.  **Note: The testing scope of this panel includes these medications:  Hydrocodone (Norco)  Hydrocodone (Vicodin)  **Note: The testing scope of this panel does not include following  reported medications:  Acetaminophen (Norco)  Acetaminophen (Vicodin)  Atenolol (Tenormin)  Atorvastatin (Lipitor)  Clopidogrel (Plavix)  Iron (Ferrous Sulfate)  Lubiprostone (Amitiza)  Magnesium (Mag-Ox)  Methocarbamol (Robaxin)  Mirtazapine (Remeron)   Pantoprazole (Protonix)  Supplement ==================================================================== For clinical consultation, please call [phone]. ====================================================================    UDS interpretation: Compliant          Medication Assessment Form: Reviewed. Patient indicates being compliant with therapy Treatment compliance: Compliant Risk Assessment: Aberrant Behavior: None observed today Substance Use Disorder (SUD) Risk Level: Low-to-moderate Risk of opioid abuse or dependence: 0.7-3.0% with doses ? 36 MME/day and 6.1-26% with doses ? 120 MME/day. Opioid Risk Tool (ORT) Score: 3   Low Risk for SUD (Score <3) Depression Scale Score: PHQ-2: 0   No depression (0) PHQ-9: 0   No depression (0-4)  Pharmacologic Plan: No change in therapy, at this time  Laboratory Chemistry  Inflammation Markers Lab Results  Component Value Date   ESRSEDRATE 3 01/08/2016   CRP 1.1 (H) 01/08/2016   Renal Function Lab Results  Component Value Date   BUN 11 01/08/2016   CREATININE 0.91 01/08/2016   GFRAA >60 01/08/2016   GFRNONAA 57 (L) 01/08/2016   Hepatic Function Lab Results  Component Value Date   AST 18 01/08/2016   ALT 12 (L) 01/08/2016   ALBUMIN 4.2 01/08/2016   Electrolytes Lab Results  Component Value Date   NA 136 01/08/2016   K 4.2 01/08/2016   CL 102 01/08/2016   CALCIUM 9.1 01/08/2016   MG 2.0 01/08/2016   Pain Modulating Vitamins Lab Results  Component Value Date   VD25OH 5.0 (L) 01/08/2016   Coagulation Parameters Lab Results  Component Value Date   INR 1.1 02/21/2013   LABPROT 14.2 02/21/2013   APTT 110.0 (H) 11/15/2011   PLT 469 (H) 10/13/2015   Cardiovascular Lab Results  Component Value Date   HGB 10.3 (L) 02/28/2013   HCT 44.7 10/13/2015    Note: Lab results reviewed.  Recent Diagnostic Imaging  No results found. Meds  The patient has a current medication list which includes the following  prescription(s): atenolol, atorvastatin, vitamin d3, clopidogrel, ergocalciferol, ferrous sulfate, hydrocodone-acetaminophen, hydrocodone-acetaminophen, hydrocodone-acetaminophen, magnesium-oxide, methocarbamol, mirtazapine, pantoprazole, polyethylene glycol powder, and benefiber.  Current Outpatient Prescriptions on File Prior to Visit  Medication Sig  . atenolol (TENORMIN) 25 MG tablet TAKE 1 TABLET BY MOUTH DAILY  . atorvastatin (LIPITOR) 10 MG tablet TAKE 1 TABLET BY MOUTH EVERY NIGHT AT BEDTIME  . Cholecalciferol (VITAMIN D3) 2000 units capsule Take 1 capsule (2,000 Units total) by mouth daily.  . clopidogrel (PLAVIX) 75 MG tablet Take 1 tablet by mouth daily.  . ferrous sulfate 325 (65 FE) MG EC tablet Take 1 tablet (325 mg total) by mouth daily.  . mirtazapine (REMERON) 15 MG tablet Take 1 tablet (15 mg total) by mouth at bedtime.  . pantoprazole (PROTONIX) 40 MG tablet TAKE 1 TABLET(40 MG) BY MOUTH TWICE DAILY   No current facility-administered medications on file prior to visit.    ROS  Constitutional: Denies any fever or chills Gastrointestinal: No reported hemesis, hematochezia, vomiting, or acute GI distress Musculoskeletal: Denies any acute onset joint swelling, redness, loss of ROM, or weakness Neurological: No reported episodes of acute onset apraxia, aphasia, dysarthria, agnosia, amnesia, paralysis, loss of coordination, or loss of consciousness  Allergies  Ms. Yuhas has No Known Allergies.  [place]  Medical:  Ms. Lesh  has a past medical history of Aneurysm of iliac artery ([place]) (05/11/2015); COPD (chronic obstructive pulmonary disease) ([place]); Cystitis; History of DVT (deep vein thrombosis) (05/11/2015); History of peptic ulcer disease (05/11/2015); [name]; Hypertension; Migraines; [name]; Stroke [facility]); and Vascular disease. Family: family history includes Brain cancer in her father; Kidney disease in her mother. Surgical:  has a past  surgical history that includes Partial hysterectomy; [name]; [name], 2008); Back surgery; Colonoscopy (2012); Esophagogastroduodenoscopy (04/2013); Abdominal hysterectomy; Appendectomy; and blood clot . Tobacco:  reports that she has been smoking Cigarettes.  She has a 30.50 pack-year smoking history. She has never used smokeless tobacco. Alcohol:  reports that she does not drink alcohol. Drug:  reports that she does not use drugs.  Constitutional Exam  General appearance: Well nourished, well developed, and well hydrated. In no acute distress Vitals:   04/15/16 1339  BP: (!) 155/54  Pulse: 61  Resp: 18  Temp: 98.3 F (36.8 C)  SpO2: 93%  Weight: 114 lb (51.7 kg)  Height: '5\' 4"'$  (1.626 m)  BMI Assessment: Estimated body mass index is 19.57 kg/m as calculated from the following:   Height as of this encounter: '5\' 4"'$  (1.626 m).   Weight as of this encounter: 114  lb (51.7 kg).   BMI interpretation: (18.5-24.9 kg/m2) = Ideal body weight BMI Readings from Last 4 Encounters:  04/15/16 19.57 kg/m  01/10/16 19.40 kg/m  11/07/15 19.57 kg/m  10/13/15 19.57 kg/m   Wt Readings from Last 4 Encounters:  04/15/16 114 lb (51.7 kg)  01/10/16 113 lb (51.3 kg)  11/07/15 114 lb (51.7 kg)  10/13/15 114 lb (51.7 kg)  Psych/Mental status: Alert and oriented x 3 (person, place, & time) Eyes: PERLA Respiratory: No evidence of acute respiratory distress  Cervical Spine Exam  Inspection: No masses, redness, or swelling Alignment: Symmetrical Functional ROM: Unrestricted ROM Stability: No instability detected Muscle strength & Tone: Functionally intact Sensory: Unimpaired Palpation: Non-contributory  Upper Extremity (UE) Exam    Side: Right upper extremity  Side: Left upper extremity  Inspection: No masses, redness, swelling, or asymmetry  Inspection: No masses, redness, swelling, or asymmetry  Functional ROM:  Unrestricted ROM         Functional ROM: Unrestricted ROM          Muscle strength & Tone: Functionally intact  Muscle strength & Tone: Functionally intact  Sensory: Unimpaired  Sensory: Unimpaired  Palpation: Non-contributory  Palpation: Non-contributory   Thoracic Spine Exam  Inspection: No masses, redness, or swelling Alignment: Symmetrical Functional ROM: Unrestricted ROM Stability: No instability detected Sensory: Unimpaired Muscle strength & Tone: Functionally intact Palpation: Non-contributory  Lumbar Spine Exam  Inspection: No masses, redness, or swelling Alignment: Symmetrical Functional ROM: Decreased ROM Stability: No instability detected Muscle strength & Tone: Functionally intact Sensory: Movement-associated discomfort Palpation: Tender Provocative Tests: Lumbar Hyperextension and rotation test: Positive bilaterally for facet joint pain. Patrick's Maneuver: evaluation deferred today              Gait & Posture Assessment  Ambulation: Unassisted Gait: Relatively normal for age and body habitus Posture: WNL   Lower Extremity Exam    Side: Right lower extremity  Side: Left lower extremity  Inspection: No masses, redness, swelling, or asymmetry  Inspection: No masses, redness, swelling, or asymmetry  Functional ROM: Unrestricted ROM          Functional ROM: Unrestricted ROM          Muscle strength & Tone: Functionally intact  Muscle strength & Tone: Functionally intact  Sensory: Unimpaired  Sensory: Unimpaired  Palpation: Non-contributory  Palpation: Non-contributory   Assessment  Primary Diagnosis & Pertinent Problem List: The primary encounter diagnosis was Other chronic pain. Diagnoses of Chronic lower extremity pain (Location of Primary Source of Pain) (Right), Long term current use of opiate analgesic, Opiate use, Vitamin D deficiency, Gastroesophageal reflux disease without esophagitis, Musculoskeletal pain, and Opioid-induced constipation (OIC) were also  pertinent to this visit.  Visit Diagnosis: 1. Other chronic pain   2. Chronic lower extremity pain (Location of Primary Source of Pain) (Right)   3. Long term current use of opiate analgesic   4. Opiate use   5. Vitamin D deficiency   6. Gastroesophageal reflux disease without esophagitis   7. Musculoskeletal pain   8. Opioid-induced constipation (OIC)    Plan of Care  Pharmacotherapy (Medications Ordered): Meds ordered this encounter  Medications  . MAGNESIUM-OXIDE 400 (241.3 Mg) MG tablet    Sig: Take 1 tablet (400 mg total) by mouth daily. Reported on 11/07/2015    Dispense:  90 tablet    Refill:  0    Do not add this medication to the electronic "Automatic Refill" notification system. Patient may have prescription filled one day early if  pharmacy is closed on scheduled refill date.  . methocarbamol (ROBAXIN) 750 MG tablet    Sig: Take 1 tablet (750 mg total) by mouth 3 (three) times daily as needed for muscle spasms.    Dispense:  90 tablet    Refill:  2    Do not add this medication to the electronic "Automatic Refill" notification system. Patient may have prescription filled one day early if pharmacy is closed on scheduled refill date.  Marland Kitchen HYDROcodone-acetaminophen (NORCO/VICODIN) 5-325 MG tablet    Sig: Take 1 tablet by mouth every 8 (eight) hours as needed for severe pain.    Dispense:  90 tablet    Refill:  0    Do not place this medication, or any other prescription from our practice, on "Automatic Refill". Patient may have prescription filled one day early if pharmacy is closed on scheduled refill date. Do not fill until: 05/05/16 To last until: 06/04/16  . HYDROcodone-acetaminophen (NORCO/VICODIN) 5-325 MG tablet    Sig: Take 1 tablet by mouth every 8 (eight) hours as needed for severe pain.    Dispense:  90 tablet    Refill:  0    Do not place this medication, or any other prescription from our practice, on "Automatic Refill". Patient may have prescription filled one  day early if pharmacy is closed on scheduled refill date. Do not fill until: 06/04/16 To last until: 07/04/16  . HYDROcodone-acetaminophen (NORCO/VICODIN) 5-325 MG tablet    Sig: Take 1 tablet by mouth every 8 (eight) hours as needed for severe pain.    Dispense:  90 tablet    Refill:  0    Do not place this medication, or any other prescription from our practice, on "Automatic Refill". Patient may have prescription filled one day early if pharmacy is closed on scheduled refill date. Do not fill until: 07/04/16 To last until: 08/03/16  . ergocalciferol (VITAMIN D2) 50000 units capsule    Sig: Take 1 capsule (50,000 Units total) by mouth 2 (two) times a week. X 6 weeks.    Dispense:  12 capsule    Refill:  0    Do not add this medication to the electronic "Automatic Refill" notification system. Patient may have prescription filled one day early if pharmacy is closed on scheduled refill date.  . Wheat Dextrin (BENEFIBER) POWD    Sig: Stir 2 tsp. TID into 4-8 oz of any non-carbonated beverage or soft food (hot or cold)    Dispense:  500 g    Refill:  2    This is an OTC product. This prescription is to serve as a reminder to the patient as to our preference.   New Prescriptions   WHEAT DEXTRIN (BENEFIBER) POWD    Stir 2 tsp. TID into 4-8 oz of any non-carbonated beverage or soft food (hot or cold)   Medications administered during this visit: Ms. Harries had no medications administered during this visit. Lab-work, Procedure(s), & Referral(s) Ordered: Orders Placed This Encounter  Procedures  . 25-Hydroxyvitamin D Lcms D2+D3   Imaging & Referral(s) Ordered: None  Interventional Therapies: Scheduled:  None at this time.    Considering:   Diagnostic right L4-5 transforaminal epidural steroid injection under fluoroscopic guidance, with a without sedation.  Diagnostic right L4-5 lumbar epidural steroid injection under fluoroscopic guidance, with a without sedation.  Diagnostic  bilateral lumbar facet block under fluoroscopic guidance and IV sedation.  Possible bilateral lumbar facet radiofrequency ablation under fluoroscopic guidance and IV sedation.  PRN Procedures:  Diagnostic right L4-5 transforaminal epidural steroid injection under fluoroscopic guidance, with a without sedation.  Diagnostic right L4-5 lumbar epidural steroid injection under fluoroscopic guidance, with a without sedation.  Diagnostic bilateral lumbar facet block under fluoroscopic guidance and IV sedation.    Requested PM Follow-up: Return in 3 months (on 07/30/2016) for Med-Mgmt.  Future Appointments Date Time Provider [place]  07/30/2016 1:30 PM Milinda Pointer, MD [facility] None  10/18/2016 10:30 AM Glean Hess, MD [facility] None   Primary Care Physician: Halina Maidens, MD Location: [facility] [place] [facility] Note by: Kathlen Brunswick. Dossie Arbour, M.D, DABA, DABAPM, DABPM, DABIPP, FIPP  Pain Score Disclaimer: We use the NRS-11 scale. This is a self-reported, subjective measurement of pain severity with only modest accuracy. It is used primarily to identify changes within a particular patient. It must be understood that outpatient pain scales are significantly less accurate that those used for research, where they can be applied under ideal controlled circumstances with minimal exposure to variables. In reality, the score is likely to be a combination of pain intensity and pain affect, where pain affect describes the degree of emotional arousal or changes in action readiness caused by the sensory experience of pain. Factors such as social and work situation, setting, emotional state, anxiety levels, expectation, and prior pain experience may influence pain perception and show large inter-individual differences that may also be affected by time variables.  Patient instructions provided during this appointment: There are no Patient Instructions on file for this visit.

## 2016-04-19 LAB — 25-HYDROXYVITAMIN D LCMS D2+D3
25-HYDROXY, VITAMIN D-2: 32 ng/mL
25-HYDROXY, VITAMIN D-3: 26 ng/mL

## 2016-04-19 LAB — 25-HYDROXY VITAMIN D LCMS D2+D3: 25-Hydroxy, Vitamin D: 58 ng/mL

## 2016-05-01 ENCOUNTER — Other Ambulatory Visit: Payer: Self-pay | Admitting: Internal Medicine

## 2016-05-08 ENCOUNTER — Other Ambulatory Visit: Payer: Self-pay | Admitting: Internal Medicine

## 2016-05-27 ENCOUNTER — Other Ambulatory Visit: Payer: Self-pay | Admitting: Pain Medicine

## 2016-05-27 DIAGNOSIS — E559 Vitamin D deficiency, unspecified: Secondary | ICD-10-CM

## 2016-05-29 ENCOUNTER — Other Ambulatory Visit: Payer: Self-pay | Admitting: Internal Medicine

## 2016-06-05 ENCOUNTER — Other Ambulatory Visit: Payer: Self-pay | Admitting: Internal Medicine

## 2016-07-29 ENCOUNTER — Encounter: Payer: Medicare Other | Admitting: Pain Medicine

## 2016-07-30 ENCOUNTER — Encounter: Payer: Self-pay | Admitting: Pain Medicine

## 2016-07-30 ENCOUNTER — Ambulatory Visit: Payer: Medicare Other | Attending: Pain Medicine | Admitting: Pain Medicine

## 2016-07-30 VITALS — BP 146/54 | HR 74 | Temp 98.1°F | Resp 16 | Ht 64.0 in | Wt 113.0 lb

## 2016-07-30 DIAGNOSIS — M79604 Pain in right leg: Secondary | ICD-10-CM | POA: Diagnosis not present

## 2016-07-30 DIAGNOSIS — M79605 Pain in left leg: Secondary | ICD-10-CM | POA: Insufficient documentation

## 2016-07-30 DIAGNOSIS — E559 Vitamin D deficiency, unspecified: Secondary | ICD-10-CM | POA: Insufficient documentation

## 2016-07-30 DIAGNOSIS — M5416 Radiculopathy, lumbar region: Secondary | ICD-10-CM | POA: Diagnosis not present

## 2016-07-30 DIAGNOSIS — Z79891 Long term (current) use of opiate analgesic: Secondary | ICD-10-CM | POA: Diagnosis not present

## 2016-07-30 DIAGNOSIS — M9983 Other biomechanical lesions of lumbar region: Secondary | ICD-10-CM | POA: Diagnosis not present

## 2016-07-30 DIAGNOSIS — M545 Low back pain: Secondary | ICD-10-CM | POA: Diagnosis not present

## 2016-07-30 DIAGNOSIS — T402X5A Adverse effect of other opioids, initial encounter: Secondary | ICD-10-CM

## 2016-07-30 DIAGNOSIS — D509 Iron deficiency anemia, unspecified: Secondary | ICD-10-CM | POA: Diagnosis not present

## 2016-07-30 DIAGNOSIS — F1190 Opioid use, unspecified, uncomplicated: Secondary | ICD-10-CM

## 2016-07-30 DIAGNOSIS — G894 Chronic pain syndrome: Secondary | ICD-10-CM | POA: Insufficient documentation

## 2016-07-30 DIAGNOSIS — M5441 Lumbago with sciatica, right side: Secondary | ICD-10-CM

## 2016-07-30 DIAGNOSIS — Z7902 Long term (current) use of antithrombotics/antiplatelets: Secondary | ICD-10-CM | POA: Diagnosis not present

## 2016-07-30 DIAGNOSIS — I10 Essential (primary) hypertension: Secondary | ICD-10-CM | POA: Insufficient documentation

## 2016-07-30 DIAGNOSIS — Z8673 Personal history of transient ischemic attack (TIA), and cerebral infarction without residual deficits: Secondary | ICD-10-CM | POA: Diagnosis not present

## 2016-07-30 DIAGNOSIS — M48061 Spinal stenosis, lumbar region without neurogenic claudication: Secondary | ICD-10-CM | POA: Insufficient documentation

## 2016-07-30 DIAGNOSIS — K5900 Constipation, unspecified: Secondary | ICD-10-CM | POA: Insufficient documentation

## 2016-07-30 DIAGNOSIS — J449 Chronic obstructive pulmonary disease, unspecified: Secondary | ICD-10-CM | POA: Diagnosis not present

## 2016-07-30 DIAGNOSIS — R112 Nausea with vomiting, unspecified: Secondary | ICD-10-CM | POA: Diagnosis not present

## 2016-07-30 DIAGNOSIS — Z86718 Personal history of other venous thrombosis and embolism: Secondary | ICD-10-CM | POA: Diagnosis not present

## 2016-07-30 DIAGNOSIS — G8929 Other chronic pain: Secondary | ICD-10-CM

## 2016-07-30 DIAGNOSIS — K5903 Drug induced constipation: Secondary | ICD-10-CM

## 2016-07-30 DIAGNOSIS — M549 Dorsalgia, unspecified: Secondary | ICD-10-CM | POA: Diagnosis present

## 2016-07-30 DIAGNOSIS — K219 Gastro-esophageal reflux disease without esophagitis: Secondary | ICD-10-CM | POA: Diagnosis not present

## 2016-07-30 DIAGNOSIS — M7918 Myalgia, other site: Secondary | ICD-10-CM

## 2016-07-30 DIAGNOSIS — F17210 Nicotine dependence, cigarettes, uncomplicated: Secondary | ICD-10-CM | POA: Insufficient documentation

## 2016-07-30 DIAGNOSIS — E7800 Pure hypercholesterolemia, unspecified: Secondary | ICD-10-CM | POA: Diagnosis not present

## 2016-07-30 DIAGNOSIS — M791 Myalgia: Secondary | ICD-10-CM | POA: Diagnosis not present

## 2016-07-30 DIAGNOSIS — I739 Peripheral vascular disease, unspecified: Secondary | ICD-10-CM | POA: Insufficient documentation

## 2016-07-30 MED ORDER — VITAMIN D3 50 MCG (2000 UT) PO CAPS: ORAL_CAPSULE | ORAL | 99 refills | Status: DC

## 2016-07-30 MED ORDER — HYDROCODONE-ACETAMINOPHEN 5-325 MG PO TABS: 1.0000 | ORAL_TABLET | Freq: Three times a day (TID) | ORAL | 0 refills | Status: DC | PRN

## 2016-07-30 MED ORDER — METHOCARBAMOL 750 MG PO TABS: 750.0000 mg | ORAL_TABLET | Freq: Three times a day (TID) | ORAL | 2 refills | Status: DC | PRN

## 2016-07-30 MED ORDER — BENEFIBER PO POWD: ORAL | 2 refills | Status: DC

## 2016-07-30 MED ORDER — MAGNESIUM-OXIDE 400 (241.3 MG) MG PO TABS: 1.0000 | ORAL_TABLET | Freq: Every day | ORAL | 0 refills | Status: DC

## 2016-07-30 MED ORDER — PROMETHAZINE HCL 25 MG PO TABS: 25.0000 mg | ORAL_TABLET | Freq: Four times a day (QID) | ORAL | 0 refills | Status: DC | PRN
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
## 2016-07-30 NOTE — Progress Notes (Signed)
Nursing Pain Medication Assessment:  Safety precautions to be maintained throughout the outpatient stay will include: orient to surroundings, keep bed in low position, maintain call bell within reach at all times, provide assistance with transfer out of bed and ambulation.  Medication Inspection Compliance: Ms. Witting did not comply with our request to bring her pills to be counted. She was reminded that bringing the medication bottles, even when empty, is a requirement. Pill Count: No pills available to be counted today. Bottle Appearance: No container available. Did not bring bottle(s) to appointment. Medication: None brought in. Filled Date: N/A Medication last intake: 07/11/2016

## 2016-08-19 ENCOUNTER — Encounter: Payer: Self-pay | Admitting: *Deleted

## 2016-08-19 ENCOUNTER — Encounter: Payer: Self-pay | Admitting: Emergency Medicine

## 2016-08-19 ENCOUNTER — Emergency Department: Payer: Medicare Other

## 2016-08-19 ENCOUNTER — Inpatient Hospital Stay
Admission: EM | Admit: 2016-08-19 | Discharge: 2016-08-21 | DRG: 193 | Disposition: A | Discharge status: Home Health | Payer: Medicare Other | Attending: Internal Medicine | Admitting: Internal Medicine

## 2016-08-19 ENCOUNTER — Ambulatory Visit (INDEPENDENT_AMBULATORY_CARE_PROVIDER_SITE_OTHER)
Admission: EM | Admit: 2016-08-19 | Discharge: 2016-08-19 | Disposition: A | Discharge status: Other Acute Inpatient Hospital | Payer: Medicare Other | Source: Home / Self Care | Attending: Family Medicine | Admitting: Family Medicine

## 2016-08-19 DIAGNOSIS — I10 Essential (primary) hypertension: Secondary | ICD-10-CM | POA: Diagnosis present

## 2016-08-19 DIAGNOSIS — Z86718 Personal history of other venous thrombosis and embolism: Secondary | ICD-10-CM

## 2016-08-19 DIAGNOSIS — R0600 Dyspnea, unspecified: Secondary | ICD-10-CM | POA: Diagnosis not present

## 2016-08-19 DIAGNOSIS — J449 Chronic obstructive pulmonary disease, unspecified: Secondary | ICD-10-CM | POA: Diagnosis present

## 2016-08-19 DIAGNOSIS — E7800 Pure hypercholesterolemia, unspecified: Secondary | ICD-10-CM | POA: Diagnosis present

## 2016-08-19 DIAGNOSIS — Z808 Family history of malignant neoplasm of other organs or systems: Secondary | ICD-10-CM | POA: Diagnosis not present

## 2016-08-19 DIAGNOSIS — Z87440 Personal history of urinary (tract) infections: Secondary | ICD-10-CM | POA: Diagnosis not present

## 2016-08-19 DIAGNOSIS — R0902 Hypoxemia: Secondary | ICD-10-CM

## 2016-08-19 DIAGNOSIS — R0602 Shortness of breath: Secondary | ICD-10-CM | POA: Diagnosis not present

## 2016-08-19 DIAGNOSIS — R05 Cough: Secondary | ICD-10-CM | POA: Diagnosis not present

## 2016-08-19 DIAGNOSIS — Z681 Body mass index (BMI) 19 or less, adult: Secondary | ICD-10-CM

## 2016-08-19 DIAGNOSIS — R300 Dysuria: Secondary | ICD-10-CM | POA: Diagnosis not present

## 2016-08-19 DIAGNOSIS — Z841 Family history of disorders of kidney and ureter: Secondary | ICD-10-CM

## 2016-08-19 DIAGNOSIS — J181 Lobar pneumonia, unspecified organism: Secondary | ICD-10-CM

## 2016-08-19 DIAGNOSIS — I951 Orthostatic hypotension: Secondary | ICD-10-CM | POA: Diagnosis present

## 2016-08-19 DIAGNOSIS — E785 Hyperlipidemia, unspecified: Secondary | ICD-10-CM | POA: Diagnosis present

## 2016-08-19 DIAGNOSIS — F17210 Nicotine dependence, cigarettes, uncomplicated: Secondary | ICD-10-CM | POA: Diagnosis present

## 2016-08-19 DIAGNOSIS — Z8673 Personal history of transient ischemic attack (TIA), and cerebral infarction without residual deficits: Secondary | ICD-10-CM | POA: Diagnosis not present

## 2016-08-19 DIAGNOSIS — E43 Unspecified severe protein-calorie malnutrition: Secondary | ICD-10-CM | POA: Diagnosis present

## 2016-08-19 DIAGNOSIS — J9601 Acute respiratory failure with hypoxia: Secondary | ICD-10-CM | POA: Diagnosis present

## 2016-08-19 DIAGNOSIS — Z7902 Long term (current) use of antithrombotics/antiplatelets: Secondary | ICD-10-CM

## 2016-08-19 DIAGNOSIS — R059 Cough, unspecified: Secondary | ICD-10-CM

## 2016-08-19 DIAGNOSIS — J189 Pneumonia, unspecified organism: Secondary | ICD-10-CM | POA: Diagnosis present

## 2016-08-19 DIAGNOSIS — Z8711 Personal history of peptic ulcer disease: Secondary | ICD-10-CM

## 2016-08-19 LAB — CBC
HEMATOCRIT: 40.9 % (ref 35.0–47.0)
HEMOGLOBIN: 13.7 g/dL (ref 12.0–16.0)
MCH: 29.2 pg (ref 26.0–34.0)
MCHC: 33.4 g/dL (ref 32.0–36.0)
MCV: 87.3 fL (ref 80.0–100.0)
Platelets: 476 10*3/uL — ABNORMAL HIGH (ref 150–440)
RBC: 4.68 MIL/uL (ref 3.80–5.20)
RDW: 15.4 % — AB (ref 11.5–14.5)
WBC: 11.9 10*3/uL — ABNORMAL HIGH (ref 3.6–11.0)

## 2016-08-19 LAB — COMPREHENSIVE METABOLIC PANEL
ALBUMIN: 3.2 g/dL — AB (ref 3.5–5.0)
ALK PHOS: 69 U/L (ref 38–126)
ALT: 17 U/L (ref 14–54)
ANION GAP: 7 (ref 5–15)
AST: 23 U/L (ref 15–41)
BILIRUBIN TOTAL: 0.4 mg/dL (ref 0.3–1.2)
BUN: 9 mg/dL (ref 6–20)
CALCIUM: 8.5 mg/dL — AB (ref 8.9–10.3)
CO2: 31 mmol/L (ref 22–32)
Chloride: 98 mmol/L — ABNORMAL LOW (ref 101–111)
Creatinine, Ser: 0.88 mg/dL (ref 0.44–1.00)
GFR calc Af Amer: >60 mL/min (ref >60)
GFR calc non Af Amer: 59 mL/min — ABNORMAL LOW (ref >60)
GLUCOSE: 116 mg/dL — AB (ref 65–99)
POTASSIUM: 4.1 mmol/L (ref 3.5–5.1)
SODIUM: 136 mmol/L (ref 135–145)
TOTAL PROTEIN: 7.2 g/dL (ref 6.5–8.1)

## 2016-08-19 LAB — INFLUENZA PANEL BY PCR (TYPE A & B)
Influenza A By PCR: NEGATIVE
Influenza B By PCR: NEGATIVE

## 2016-08-19 LAB — TROPONIN I: Troponin I: <0.03 ng/mL (ref <0.03)

## 2016-08-19 LAB — BRAIN NATRIURETIC PEPTIDE: B Natriuretic Peptide: 416 pg/mL — ABNORMAL HIGH (ref 0.0–100.0)

## 2016-08-19 MED ORDER — METHOCARBAMOL 750 MG PO TABS
750.0000 mg | ORAL_TABLET | Freq: Three times a day (TID) | ORAL | Status: DC | PRN
  Filled 2016-08-19: qty 1

## 2016-08-19 MED ORDER — DEXTROSE 5 % IV SOLN: 1.0000 g | INTRAVENOUS | Status: DC

## 2016-08-19 MED ORDER — DEXTROSE 5 % IV SOLN
500.0000 mg | Freq: Once | INTRAVENOUS | Status: AC
  Administered 2016-08-19: 500 mg via INTRAVENOUS
  Filled 2016-08-19: qty 500

## 2016-08-19 MED ORDER — CLOPIDOGREL BISULFATE 75 MG PO TABS
75.0000 mg | ORAL_TABLET | Freq: Every day | ORAL | Status: DC
  Administered 2016-08-19 – 2016-08-20 (×2): 75 mg via ORAL
  Filled 2016-08-19 (×3): qty 1

## 2016-08-19 MED ORDER — HYDROCODONE-ACETAMINOPHEN 5-325 MG PO TABS
1.0000 | ORAL_TABLET | Freq: Four times a day (QID) | ORAL | Status: DC | PRN
  Administered 2016-08-19 – 2016-08-21 (×4): 1 via ORAL
  Filled 2016-08-19 (×4): qty 1

## 2016-08-19 MED ORDER — AZITHROMYCIN 500 MG PO TABS
500.0000 mg | ORAL_TABLET | Freq: Every day | ORAL | Status: AC
  Administered 2016-08-20 – 2016-08-21 (×2): 500 mg via ORAL
  Filled 2016-08-19 (×2): qty 1

## 2016-08-19 MED ORDER — ENOXAPARIN SODIUM 40 MG/0.4ML ~~LOC~~ SOLN
40.0000 mg | SUBCUTANEOUS | Status: DC
  Administered 2016-08-19 – 2016-08-20 (×2): 40 mg via SUBCUTANEOUS
  Filled 2016-08-19 (×2): qty 0.4

## 2016-08-19 MED ORDER — PROMETHAZINE HCL 25 MG PO TABS: 25.0000 mg | ORAL_TABLET | Freq: Four times a day (QID) | ORAL | Status: DC | PRN

## 2016-08-19 MED ORDER — ONDANSETRON HCL 4 MG PO TABS: 4.0000 mg | ORAL_TABLET | Freq: Four times a day (QID) | ORAL | Status: DC | PRN

## 2016-08-19 MED ORDER — MAGNESIUM OXIDE 400 (241.3 MG) MG PO TABS
400.0000 mg | ORAL_TABLET | Freq: Every day | ORAL | Status: DC
  Administered 2016-08-19 – 2016-08-20 (×2): 400 mg via ORAL
  Filled 2016-08-19 (×3): qty 1

## 2016-08-19 MED ORDER — CEFTRIAXONE SODIUM-DEXTROSE 1-3.74 GM-% IV SOLR
1.0000 g | INTRAVENOUS | Status: DC
  Administered 2016-08-20: 18:00:00 1 g via INTRAVENOUS
  Filled 2016-08-19: qty 50

## 2016-08-19 MED ORDER — PANTOPRAZOLE SODIUM 40 MG PO TBEC
40.0000 mg | DELAYED_RELEASE_TABLET | Freq: Every day | ORAL | Status: DC
  Administered 2016-08-19 – 2016-08-20 (×2): 40 mg via ORAL
  Filled 2016-08-19 (×3): qty 1

## 2016-08-19 MED ORDER — CEFTRIAXONE SODIUM-DEXTROSE 1-3.74 GM-% IV SOLR
1.0000 g | Freq: Once | INTRAVENOUS | Status: AC
  Administered 2016-08-19: 1 g via INTRAVENOUS
  Filled 2016-08-19: qty 50

## 2016-08-19 MED ORDER — ALBUTEROL SULFATE (2.5 MG/3ML) 0.083% IN NEBU: 5.0000 mg | INHALATION_SOLUTION | Freq: Once | RESPIRATORY_TRACT | Status: DC

## 2016-08-19 MED ORDER — ATORVASTATIN CALCIUM 20 MG PO TABS
10.0000 mg | ORAL_TABLET | Freq: Every day | ORAL | Status: DC
  Administered 2016-08-19 – 2016-08-20 (×2): 10 mg via ORAL
  Filled 2016-08-19 (×2): qty 1

## 2016-08-19 MED ORDER — DEXTROSE 5 % IV SOLN: 1.0000 g | Freq: Once | INTRAVENOUS | Status: DC

## 2016-08-19 MED ORDER — POLYETHYLENE GLYCOL 3350 17 G PO PACK
17.0000 g | PACK | Freq: Every day | ORAL | Status: DC
  Filled 2016-08-19: qty 1

## 2016-08-19 MED ORDER — SODIUM CHLORIDE 0.9% FLUSH
3.0000 mL | Freq: Two times a day (BID) | INTRAVENOUS | Status: DC
  Administered 2016-08-20 – 2016-08-21 (×2): 3 mL via INTRAVENOUS

## 2016-08-19 MED ORDER — ATENOLOL 25 MG PO TABS
25.0000 mg | ORAL_TABLET | Freq: Every day | ORAL | Status: DC
  Administered 2016-08-20: 11:00:00 25 mg via ORAL
  Filled 2016-08-19 (×2): qty 1

## 2016-08-19 MED ORDER — ALBUTEROL SULFATE (2.5 MG/3ML) 0.083% IN NEBU
2.5000 mg | INHALATION_SOLUTION | RESPIRATORY_TRACT | Status: DC | PRN
Start: 2016-08-19 — End: 2016-08-21

## 2016-08-19 MED ORDER — ONDANSETRON HCL 4 MG/2ML IJ SOLN: 4.0000 mg | Freq: Four times a day (QID) | INTRAMUSCULAR | Status: DC | PRN

## 2016-08-19 MED ORDER — SODIUM CHLORIDE 0.9 % IV SOLN
INTRAVENOUS | Status: DC
  Administered 2016-08-19: 21:00:00 via INTRAVENOUS

## 2016-08-19 NOTE — ED Provider Notes (Signed)
[facility] ____________________________________________  Time seen: Approximately 1:40 PM  I have reviewed the triage vital signs and the nursing notes.   HISTORY  Chief Complaint Dysuria; Urinary Frequency; Urinary Urgency; Fever; Flank Pain; and Abdominal Pain   HPI Tasha Reyes is a 83 y.o. female presenting with son at bedside for the complaints of shortness of breath and chest congestion. Patient reports present 2 weeks ago she believes she had the flu, she reports she had quick onset of cough, congestion and fever. Reports fever maximum 103 orally. Patient reports she tried treating at home and reports the nasal congestion improved but the chest congestion gradually worsened. Patient reports fevers had resolved but then returned to the last 2 days stating low-grade fevers not more than 100 orally. Patient reports chest congestion and a feeling of shortness of breath. Patient reports shortness of breath worse with exertion but also present at rest. Patient reports some accompanying intermittent dizziness. Denies chest pain. Patient also reports her last 2 days she's had urinary frequency, urinary urgency consistent with her previous urinary tract infections. Patient reports has not been as active as normal in the last few days. Reports continues to drink water, not drinking as much is normal. Reports not eating as much as normal. Reports some over-the-counter medications help but no resolution.  Denies chest pain, abdominal pain, dysuria, or rash. Denies recent sickness. Denies recent antibiotic use. Denies changes in chronic back pain.   Halina Maidens, MD: PCP   Past Medical History:  Diagnosis Date  . Aneurysm of iliac artery (HCC) 05/11/2015  . COPD (chronic obstructive pulmonary disease) ([place])   . Cystitis   . History of DVT (deep vein thrombosis) 05/11/2015  . History of peptic ulcer disease 05/11/2015  . [name]   . Hypertension   .  Migraines   . [name]   . Stroke ([place])   . Vascular disease     Patient Active Problem List   Diagnosis Date Noted  . Nausea and vomiting 07/30/2016  . Vitamin D deficiency 01/10/2016  . Opioid-induced constipation (OIC) 11/07/2015  . Osteoporosis, post-menopausal 11/07/2015  . Musculoskeletal pain 11/07/2015  . GERD (gastroesophageal reflux disease) 11/07/2015  . Encounter for therapeutic drug level monitoring 08/09/2015  . Encounter for chronic pain management 08/09/2015  . Lumbar foraminal stenosis (Right) (Severe at L4-5) 06/20/2015  . Chronic low back pain (Location of Secondary source of pain) (Bilateral) (R>L) 05/11/2015  . Chronic lower extremity pain (Location of Primary Source of Pain) (Right) 05/11/2015  . Lumbosacral Radiculopathy (Right) 05/11/2015  . Chronic radicular lumbar pain (Right) 05/11/2015  . Long term current use of opiate analgesic 05/11/2015  . Long term prescription opiate use 05/11/2015  . Opiate use 05/11/2015  . History of abdominal aortic aneurysm (AAA) repair 05/11/2015  . Peripheral vascular disease ([place]) 05/11/2015  . Lumbar spondylosis 05/11/2015  . Hx of long term use of blood thinners (Plavix) 05/11/2015  . Lower extremity pain (Right) 05/11/2015  . Chronic pain syndrome 05/11/2015  . Neurogenic pain 05/11/2015  . History of DVT (deep vein thrombosis) 05/11/2015  . History of peptic ulcer disease 05/11/2015  . Lumbar facet hypertrophy 05/11/2015  . Facet syndrome, lumbar 05/11/2015  . Aneurysm of iliac artery (HCC) 05/11/2015    Class: History of  . Dyslipidemia 12/23/2014  . Hx of iron deficiency anemia 12/23/2014  . Compulsive tobacco user syndrome 12/23/2014  . Benign neoplasm 12/23/2014  . Chronic peptic ulcer with bleeding 12/23/2014  . Essential (  primary) hypertension 12/23/2014  . Depression, major, recurrent, in partial remission ([place]) 12/23/2014  . Alveolar emphysema of lung ([place]) 12/23/2014  . Vascular  disorder of lower extremity 12/23/2014  . Restless leg 12/23/2014  . Chronic tension-type headache, intractable 12/23/2014    Past Surgical History:  Procedure Laterality Date  . ABDOMINAL AORTIC ANEURYSM REPAIR W/ ENDOLUMINAL GRAFT  2008  . ABDOMINAL HYSTERECTOMY    . APPENDECTOMY    . BACK SURGERY    . blood clot      removal right leg  . COLONOSCOPY  2012  . ESOPHAGOGASTRODUODENOSCOPY  04/2013   gastric ulcers  . FEMORAL-POPLITEAL BYPASS GRAFT Right 2008  . PARTIAL HYSTERECTOMY       No current facility-administered medications for this encounter.   Current Outpatient Prescriptions:  .  atenolol (TENORMIN) 25 MG tablet, TAKE 1 TABLET BY MOUTH DAILY, Disp: 30 tablet, Rfl: 2 .  atorvastatin (LIPITOR) 10 MG tablet, TAKE 1 TABLET BY MOUTH EVERY NIGHT AT BEDTIME, Disp: 90 tablet, Rfl: 2 .  Cholecalciferol (VITAMIN D3) 2000 units capsule, Take 1 capsule (2,000 Units total) by mouth daily., Disp: 30 capsule, Rfl: PRN .  clopidogrel (PLAVIX) 75 MG tablet, Take 1 tablet by mouth daily., Disp: , Rfl:  .  ferrous sulfate 325 (65 FE) MG tablet, TAKE 1 TABLET(325 MG) BY MOUTH DAILY, Disp: 30 tablet, Rfl: 5 .  HYDROcodone-acetaminophen (NORCO/VICODIN) 5-325 MG tablet, Take 1 tablet by mouth every 8 (eight) hours as needed for severe pain., Disp: 90 tablet, Rfl: 0 .  MAGNESIUM-OXIDE 400 (241.3 Mg) MG tablet, Take 1 tablet (400 mg total) by mouth daily. Reported on 11/07/2015, Disp: 90 tablet, Rfl: 0 .  methocarbamol (ROBAXIN) 750 MG tablet, Take 1 tablet (750 mg total) by mouth 3 (three) times daily as needed for muscle spasms., Disp: 90 tablet, Rfl: 2 .  pantoprazole (PROTONIX) 40 MG tablet, TAKE 1 TABLET(40 MG) BY MOUTH TWICE DAILY, Disp: 60 tablet, Rfl: 5 .  polyethylene glycol powder (GLYCOLAX/MIRALAX) powder, MIX AND TK 17 GRAMS PO D., Disp: , Rfl: 5 .  promethazine (PHENERGAN) 25 MG tablet, Take 1 tablet (25 mg total) by mouth every 6 (six) hours as needed for nausea or vomiting., Disp: 30  tablet, Rfl: 0 .  [START ON 09/02/2016] HYDROcodone-acetaminophen (NORCO/VICODIN) 5-325 MG tablet, Take 1 tablet by mouth every 8 (eight) hours as needed for severe pain., Disp: 90 tablet, Rfl: 0 .  [START ON 10/02/2016] HYDROcodone-acetaminophen (NORCO/VICODIN) 5-325 MG tablet, Take 1 tablet by mouth every 8 (eight) hours as needed for severe pain., Disp: 90 tablet, Rfl: 0 .  mirtazapine (REMERON) 15 MG tablet, TAKE 1 TABLET(15 MG) BY MOUTH AT BEDTIME, Disp: 30 tablet, Rfl: 5 .  Wheat Dextrin (BENEFIBER) POWD, Stir 2 tsp. TID into 4-8 oz of any non-carbonated beverage or soft food (hot or cold), Disp: 500 g, Rfl: 2  Facility-Administered Medications Ordered in Other Encounters:  .  albuterol (PROVENTIL) (2.5 MG/3ML) 0.083% nebulizer solution 5 mg, 5 mg, Nebulization, Once, Daymon Larsen, MD  Allergies Patient has no known allergies.  Family History  Problem Relation Age of Onset  . Brain cancer Father   . Kidney disease Mother     Social History Social History  Substance Use Topics  . Smoking status: Current Every Day Smoker    Packs/day: 0.50    Years: 61.00    Types: Cigarettes  . Smokeless tobacco: Never Used  . Alcohol use No    Review of Systems Constitutional: As above.  Eyes: No visual changes. ENT: No sore throat. Cardiovascular: Denies chest pain. Respiratory: Positive shortness of breath. Gastrointestinal:  No nausea, no vomiting.  No diarrhea.  No constipation. Genitourinary: Negative for dysuria. Musculoskeletal: Reports chronic back pain, unchanged. Skin: Negative for rash. Neurological: Negative for  focal weakness or numbness.  10-point ROS otherwise negative.  ____________________________________________   PHYSICAL EXAM:  VITAL SIGNS: ED Triage Vitals [08/19/16 1312]  Enc Vitals Group     BP 136/76     Pulse Rate 72     Resp 16     Temp 98.1 F (36.7 C)     Temp Source Oral     SpO2 (!) 86 %     Weight      Height      Head Circumference        Peak Flow      Pain Score      Pain Loc      Pain Edu?      Excl. in [place]?    Vitals:   08/19/16 1312 08/19/16 1331  BP: 136/76   Pulse: 72   Resp: 16   Temp: 98.1 F (36.7 C)   TempSrc: Oral   SpO2: (!) 86% 94%  2 L nasal cannula.  Constitutional: Alert and oriented. Well appearing and in no acute distress. Eyes: Conjunctivae are normal. PERRL. EOMI. ENT      Head: Normocephalic and atraumatic.      Nose: No congestion/rhinnorhea.      Mouth/Throat: Slightly dry mucous membranes orally. No pharyngeal erythema. Neck: No stridor. Supple without meningismus.  Hematological/Lymphatic/Immunilogical: No cervical lymphadenopathy. Cardiovascular: Normal rate, regular rhythm. Grossly normal heart sounds.  Good peripheral circulation. Respiratory: Normal respiratory effort without tachypnea nor retractions. Mild diminished bilateral base breath sounds. No wheezes. Mild scattered rhonchi. No focal area of consolidation auscultated. Intermittent cough noted in room. Gastrointestinal: Soft and nontender.  Musculoskeletal:  Ambulatory with one person assist.       Right lower leg:  No tenderness or edema. Unable to palpate right posterior tibialis, per patient chronic secondary to previous vascular surgery.       Left lower leg:  Left lower posterior calf mild tenderness palpation, no edema. Left lower extremity pulses easily palpated. Neurologic:  Normal speech and language.  Speech is normal. No gait instability.  Skin:  Skin is warm, dry and intact. No rash noted. Psychiatric: Mood and affect are normal. Speech and behavior are normal. Patient exhibits appropriate insight and judgment   ___________________________________________   LABS (all labs ordered are listed, but only abnormal results are displayed)  Labs Reviewed - No data to display ____________________________________________  EKG  ED ECG REPORT I, Marylene Land, the attending provider, personally viewed and  interpreted this ECG.   Date: 08/19/2016  EKG Time: 1348   Rate: 68  Rhythm:  normal sinus rhythm  Axis: left axis deviation  Intervals:none  ST&T Change: no acute ST or T waves changes noted in comparison to previous ecg Similar to ecg 02/20/2013  ____________________________________________  RADIOLOGY  No results found. ____________________________________________   PROCEDURES Procedures    INITIAL IMPRESSION / ASSESSMENT AND PLAN / ED COURSE  Pertinent labs & imaging results that were available during my care of the patient were reviewed by me and considered in my medical decision making (see chart for details).  Patient presenting with son at bedside for complaints of 2 weeks of cough and chest congestion with gradual onset of shortness of breath and dyspnea on exertion.  Patient noted to be hypoxic in urgent care at 84-86% room air. Patient dates that she has a history of COPD, but denies previously abnormal oxygenation saturations. Patient with recent description of flulike symptoms and fevers, concerning for secondary infection. Discussed in detail with patient and son due to concern of acute respiratory failure, recommend patient be seen and further evaluated to the emergency room at this time and recommend EMS transfer. Patient on 2 L O2 nasal cannula and reporting shortness breath mildly improved with oxygenation at 94%. Patient and son agree to this plan. EMS called in at bedside. Patient stable at time of EMS transfer.  Greg RN detergents or comments regional called and report.  Discussed follow up with Primary care physician this week. Discussed follow up and return parameters including no resolution or any worsening concerns. Patient verbalized understanding and agreed to plan.   ____________________________________________   FINAL CLINICAL IMPRESSION(S) / ED DIAGNOSES  Final diagnoses:  Hypoxia  Dyspnea, unspecified type  Cough  Dysuria     Discharge  Medication List as of 08/19/2016  2:04 PM      Note: This dictation was prepared with Dragon dictation along with smaller phrase technology. Any transcriptional errors that result from this process are unintentional.         Marylene Land, NP 08/19/16 1438

## 2016-08-19 NOTE — ED Triage Notes (Signed)
Pt to ED via EMS from [facility] with c/o SOB and syncopal episode x5 within a week. Pt A&Ox4, denies any CP. Per facility pt was 80s on RA, given 2L [place] .

## 2016-08-19 NOTE — ED Provider Notes (Signed)
[facility] Emergency Department Provider Note   ____________________________________________   First MD Initiated Contact with Patient 08/19/16 1506     (approximate)  I have reviewed the triage vital signs and the nursing notes.   HISTORY  Chief Complaint Shortness of Breath    HPI SHAHARA HARTSFIELD is a 83 y.o. female who comes in from urgent care. She was sent here for O2 sats of 86%. She reports she's had a flu the flu last week and is still sick. She is complaining of a cough productive of clear phlegm shortness of breath and a fever up to 103. She still having some chest congestion. She reports that in the last week when she stands she gets lightheaded and has actually passed out 5 times.  Past Medical History:  Diagnosis Date  . Aneurysm of iliac artery (HCC) 05/11/2015  . COPD (chronic obstructive pulmonary disease) ([place])   . Cystitis   . History of DVT (deep vein thrombosis) 05/11/2015  . History of peptic ulcer disease 05/11/2015  . [name]   . Hypertension   . Migraines   . [name]   . Stroke ([place])   . Vascular disease     Patient Active Problem List   Diagnosis Date Noted  . Nausea and vomiting 07/30/2016  . Vitamin D deficiency 01/10/2016  . Opioid-induced constipation (OIC) 11/07/2015  . Osteoporosis, post-menopausal 11/07/2015  . Musculoskeletal pain 11/07/2015  . GERD (gastroesophageal reflux disease) 11/07/2015  . Encounter for therapeutic drug level monitoring 08/09/2015  . Encounter for chronic pain management 08/09/2015  . Lumbar foraminal stenosis (Right) (Severe at L4-5) 06/20/2015  . Chronic low back pain (Location of Secondary source of pain) (Bilateral) (R>L) 05/11/2015  . Chronic lower extremity pain (Location of Primary Source of Pain) (Right) 05/11/2015  . Lumbosacral Radiculopathy (Right) 05/11/2015  . Chronic radicular lumbar pain (Right) 05/11/2015  . Long term current use of  opiate analgesic 05/11/2015  . Long term prescription opiate use 05/11/2015  . Opiate use 05/11/2015  . History of abdominal aortic aneurysm (AAA) repair 05/11/2015  . Peripheral vascular disease ([place]) 05/11/2015  . Lumbar spondylosis 05/11/2015  . Hx of long term use of blood thinners (Plavix) 05/11/2015  . Lower extremity pain (Right) 05/11/2015  . Chronic pain syndrome 05/11/2015  . Neurogenic pain 05/11/2015  . History of DVT (deep vein thrombosis) 05/11/2015  . History of peptic ulcer disease 05/11/2015  . Lumbar facet hypertrophy 05/11/2015  . Facet syndrome, lumbar 05/11/2015  . Aneurysm of iliac artery (HCC) 05/11/2015    Class: History of  . Dyslipidemia 12/23/2014  . Hx of iron deficiency anemia 12/23/2014  . Compulsive tobacco user syndrome 12/23/2014  . Benign neoplasm 12/23/2014  . Chronic peptic ulcer with bleeding 12/23/2014  . Essential (primary) hypertension 12/23/2014  . Depression, major, recurrent, in partial remission ([place]) 12/23/2014  . Alveolar emphysema of lung ([place]) 12/23/2014  . Vascular disorder of lower extremity 12/23/2014  . Restless leg 12/23/2014  . Chronic tension-type headache, intractable 12/23/2014    Past Surgical History:  Procedure Laterality Date  . ABDOMINAL AORTIC ANEURYSM REPAIR W/ ENDOLUMINAL GRAFT  2008  . ABDOMINAL HYSTERECTOMY    . APPENDECTOMY    . BACK SURGERY    . blood clot      removal right leg  . COLONOSCOPY  2012  . ESOPHAGOGASTRODUODENOSCOPY  04/2013   gastric ulcers  . FEMORAL-POPLITEAL BYPASS GRAFT Right 2008  . PARTIAL HYSTERECTOMY  Prior to Admission medications   Medication Sig Start Date End Date Taking? Authorizing Provider  atenolol (TENORMIN) 25 MG tablet TAKE 1 TABLET BY MOUTH DAILY 05/29/16   Glean Hess, MD  atorvastatin (LIPITOR) 10 MG tablet TAKE 1 TABLET BY MOUTH EVERY NIGHT AT BEDTIME 01/30/16   Glean Hess, MD  Cholecalciferol (VITAMIN D3) 2000 units capsule Take 1 capsule (2,000  Units total) by mouth daily. 08/03/16   Milinda Pointer, MD  clopidogrel (PLAVIX) 75 MG tablet Take 1 tablet by mouth daily.    Historical Provider, MD  ferrous sulfate 325 (65 FE) MG tablet TAKE 1 TABLET(325 MG) BY MOUTH DAILY 06/05/16   Glean Hess, MD  HYDROcodone-acetaminophen (NORCO/VICODIN) 5-325 MG tablet Take 1 tablet by mouth every 8 (eight) hours as needed for severe pain. 08/03/16 09/02/16  Milinda Pointer, MD  HYDROcodone-acetaminophen (NORCO/VICODIN) 5-325 MG tablet Take 1 tablet by mouth every 8 (eight) hours as needed for severe pain. 09/02/16 10/02/16  Milinda Pointer, MD  HYDROcodone-acetaminophen (NORCO/VICODIN) 5-325 MG tablet Take 1 tablet by mouth every 8 (eight) hours as needed for severe pain. 10/02/16 11/01/16  Milinda Pointer, MD  MAGNESIUM-OXIDE 400 (241.3 Mg) MG tablet Take 1 tablet (400 mg total) by mouth daily. Reported on 11/07/2015 08/03/16 11/01/16  Milinda Pointer, MD  methocarbamol (ROBAXIN) 750 MG tablet Take 1 tablet (750 mg total) by mouth 3 (three) times daily as needed for muscle spasms. 08/03/16 11/01/16  Milinda Pointer, MD  mirtazapine (REMERON) 15 MG tablet TAKE 1 TABLET(15 MG) BY MOUTH AT BEDTIME 05/02/16   Glean Hess, MD  pantoprazole (PROTONIX) 40 MG tablet TAKE 1 TABLET(40 MG) BY MOUTH TWICE DAILY 05/08/16   Glean Hess, MD  polyethylene glycol powder (GLYCOLAX/MIRALAX) powder MIX AND TK 17 GRAMS PO D. 01/30/16   Historical Provider, MD  promethazine (PHENERGAN) 25 MG tablet Take 1 tablet (25 mg total) by mouth every 6 (six) hours as needed for nausea or vomiting. 07/30/16 08/29/16  Milinda Pointer, MD  Wheat Dextrin (BENEFIBER) POWD Stir 2 tsp. TID into 4-8 oz of any non-carbonated beverage or soft food (hot or cold) 08/03/16   Milinda Pointer, MD    Allergies Patient has no known allergies.  Family History  Problem Relation Age of Onset  . Brain cancer Father   . Kidney disease Mother     Social History Social History    Substance Use Topics  . Smoking status: Current Every Day Smoker    Packs/day: 0.50    Years: 61.00    Types: Cigarettes  . Smokeless tobacco: Never Used  . Alcohol use No    Review of Systems Constitutional: fever Eyes: No visual changes. ENT: No sore throat. Cardiovascular: Denies chest pain. Respiratory:  shortness of breath. Gastrointestinal: No abdominal pain.  No nausea, no vomiting.  No diarrhea.  No constipation. Genitourinary: Negative for dysuria. Musculoskeletal: Negative for back pain. Skin: Negative for rash. Neurological: Negative for headaches, focal weakness or numbness.  10-point ROS otherwise negative.  ____________________________________________   PHYSICAL EXAM:  VITAL SIGNS: ED Triage Vitals  Enc Vitals Group     BP 08/19/16 1439 (!) 145/63     Pulse Rate 08/19/16 1439 68     Resp 08/19/16 1439 16     Temp 08/19/16 1439 98.2 F (36.8 C)     Temp Source 08/19/16 1439 Oral     SpO2 08/19/16 1439 (!) 85 %     Weight --      Height --  Head Circumference --      Peak Flow --      Pain Score 08/19/16 1441 3     Pain Loc --      Pain Edu? --      Excl. in [place]? --     Constitutional: Alert and oriented. Well appearing and in no acute distress. Eyes: Conjunctivae are normal. PERRL. EOMI. Head: Atraumatic. Nose: No congestion/rhinnorhea. Mouth/Throat: Mucous membranes are moist.  Oropharynx non-erythematous. Neck: No stridor.  Cardiovascular: Normal rate, regular rhythm. Grossly normal heart sounds.  Good peripheral circulation. Respiratory: Normal respiratory effort.  No retractions. Lungs CTAB! Gastrointestinal: Soft and nontender. No distention. No abdominal bruits. No CVA tenderness. Musculoskeletal: No lower extremity tenderness nor edema.  No joint effusions. Neurologic:  Normal speech and language. No gross focal neurologic deficits are appreciated. No gait instability. Skin:  Skin is warm, dry and intact. No rash  noted.   ____________________________________________   LABS (all labs ordered are listed, but only abnormal results are displayed)  Labs Reviewed  COMPREHENSIVE METABOLIC PANEL - Abnormal; Notable for the following:       Result Value   Chloride 98 (*)    Glucose, Bld 116 (*)    Calcium 8.5 (*)    Albumin 3.2 (*)    GFR calc non Af Amer 59 (*)    All other components within normal limits  CBC - Abnormal; Notable for the following:    WBC 11.9 (*)    RDW 15.4 (*)    Platelets 476 (*)    All other components within normal limits  BRAIN NATRIURETIC PEPTIDE - Abnormal; Notable for the following:    B Natriuretic Peptide 416.0 (*)    All other components within normal limits  TROPONIN I   ____________________________________________  EKG  KG read and interpreted by me shows sinus rhythm rate of 64 left axis nonspecific intraventricular conduction delay and poor R-wave progression ____________________________________________  RADIOLOGY  Study Result   CLINICAL DATA:  Cough and fever.  Syncopal episode.  EXAM: PORTABLE CHEST 1 VIEW  COMPARISON:  Two-view chest x-ray a 12/28/2014  FINDINGS: The heart size is normal. Atherosclerotic calcifications are again seen at the aortic arch. Emphysema is noted. New right middle lobe airspace disease is evident. The left lung is clear. The visualized soft tissues and bony thorax are unremarkable.  IMPRESSION: 1. Right middle lobe pneumonia. 2. Emphysema. 3. Aortic atherosclerosis.   Electronically Signed   By: San Morelle M.D.   On: 08/19/2016 15:41    ____________________________________________   PROCEDURES  Procedure(s) performed:   Procedures  Critical Care performed:   ____________________________________________   INITIAL IMPRESSION / ASSESSMENT AND PLAN / ED COURSE  Pertinent labs & imaging results that were available during my care of the patient were reviewed by me and considered in  my medical decision making (see chart for details).        ____________________________________________   FINAL CLINICAL IMPRESSION(S) / ED DIAGNOSES       Final diagnoses:  Hypoxia  Community acquired pneumonia of right middle lobe of lung ([place])      NEW MEDICATIONS STARTED DURING THIS VISIT:  New Prescriptions   No medications on file     Note:  This document was prepared using Dragon voice recognition software and may include unintentional dictation errors.    Nena Polio, MD 08/19/16 [id number]

## 2016-08-19 NOTE — ED Triage Notes (Signed)
Fever last week, dysuria, flank pain, urinary freq/urg, Suprapubic abd pain, nausea, presently.

## 2016-08-19 NOTE — ED Triage Notes (Signed)
DOE x2 weeks, productive cough- clear. Dizziness with posture changes.

## 2016-08-19 NOTE — Progress Notes (Signed)
Family Meeting Note  Advance Directive:yes  Today a meeting took place with the Patient, son and MD   The following clinical team members were present during this meeting:MD  The following were discussed:Patient's diagnosis: , Patient's progosis: > 12 months and Goals for treatment: Full Code, Sundays and get the diagnosis, plan of care the diagnosis, and goals of treatment are discussed with the patient and son - [id number]   not considering palliative care at this time -  Follow-up will be provided by hospitalist team    Time spent during discussion:20 minutes  Caydn Justen, Illene Silver, MD

## 2016-08-19 NOTE — ED Notes (Signed)
Sandwich tray given 

## 2016-08-19 NOTE — ED Notes (Addendum)
Pt states she has had aprox 5 syncopal episodes this week, dizziness upon standing per pt. Also states she was flu pos last week.  Pt placed on 2L [place] at 97%, hx of COPD

## 2016-08-19 NOTE — H&P (Signed)
[place] [facility] [place] NAME: Tasha Reyes    MR#:  [id number]  DATE OF BIRTH:  [birth year]  DATE OF ADMISSION:  08/19/2016  PRIMARY CARE PHYSICIAN: Halina Maidens, MD   REQUESTING/REFERRING PHYSICIAN: Cinda Quest  CHIEF COMPLAINT:   sob HISTORY OF PRESENT ILLNESS:  Tasha Reyes  is a 83 y.o. female with a known history of COPD, history of DVT, peptic ulcer disease a number, stroke and multiple other medical problems is presenting to the ED with a chief complaint of shortness of breath. Patient went to the urgent care where her pulse ox was at 86%. Reporting cough with shortness of breath and fever up to 103. Chest x-ray has revealed right middle lobe pneumonia. Patient is orthostatic in the emergency department. Hospitalist team is called to admit the patient  PAST MEDICAL HISTORY:   Past Medical History:  Diagnosis Date  . Aneurysm of iliac artery (HCC) 05/11/2015  . COPD (chronic obstructive pulmonary disease) ([place])   . Cystitis   . History of DVT (deep vein thrombosis) 05/11/2015  . History of peptic ulcer disease 05/11/2015  . [name]   . Hypertension   . Migraines   . [name]   . Stroke ([place])   . Vascular disease     PAST SURGICAL HISTOIRY:   Past Surgical History:  Procedure Laterality Date  . ABDOMINAL AORTIC ANEURYSM REPAIR W/ ENDOLUMINAL GRAFT  2008  . ABDOMINAL HYSTERECTOMY    . APPENDECTOMY    . BACK SURGERY    . blood clot      removal right leg  . COLONOSCOPY  2012  . ESOPHAGOGASTRODUODENOSCOPY  04/2013   gastric ulcers  . FEMORAL-POPLITEAL BYPASS GRAFT Right 2008  . PARTIAL HYSTERECTOMY      SOCIAL HISTORY:   Social History  Substance Use Topics  . Smoking status: Current Every Day Smoker    Packs/day: 0.50    Years: 61.00    Types: Cigarettes  . Smokeless tobacco: Never Used  . Alcohol use No    FAMILY HISTORY:   Family History  Problem Relation Age of  Onset  . Brain cancer Father   . Kidney disease Mother     DRUG ALLERGIES:  No Known Allergies  REVIEW OF SYSTEMS:  CONSTITUTIONAL: No fever, fatigue or weakness.  EYES: No blurred or double vision.  EARS, NOSE, AND THROAT: No tinnitus or ear pain.  RESPIRATORY: has cough, shortness of breath, no  wheezing or hemoptysis.  CARDIOVASCULAR: No chest pain, orthopnea, edema.  GASTROINTESTINAL: No nausea, vomiting, diarrhea or abdominal pain.  GENITOURINARY: No dysuria, hematuria.  ENDOCRINE: No polyuria, nocturia,  HEMATOLOGY: No anemia, easy bruising or bleeding SKIN: No rash or lesion. MUSCULOSKELETAL: No joint pain or arthritis.   NEUROLOGIC: No tingling, numbness, weakness.  PSYCHIATRY: No anxiety or depression.   MEDICATIONS AT HOME:   Prior to Admission medications   Medication Sig Start Date End Date Taking? Authorizing Provider  atenolol (TENORMIN) 25 MG tablet TAKE 1 TABLET BY MOUTH DAILY 05/29/16  Yes Glean Hess, MD  atorvastatin (LIPITOR) 10 MG tablet TAKE 1 TABLET BY MOUTH EVERY NIGHT AT BEDTIME 01/30/16  Yes Glean Hess, MD  Cholecalciferol (VITAMIN D3) 2000 units capsule Take 1 capsule (2,000 Units total) by mouth daily. 08/03/16  Yes Milinda Pointer, MD  clopidogrel (PLAVIX) 75 MG tablet Take 1 tablet by mouth daily.   Yes Historical Provider, MD  ferrous sulfate 325 (65 FE) MG tablet TAKE 1  TABLET(325 MG) BY MOUTH DAILY 06/05/16  Yes Glean Hess, MD  HYDROcodone-acetaminophen (NORCO/VICODIN) 5-325 MG tablet Take 1 tablet by mouth every 8 (eight) hours as needed for severe pain. 08/03/16 09/02/16 Yes Milinda Pointer, MD  MAGNESIUM-OXIDE 400 (241.3 Mg) MG tablet Take 1 tablet (400 mg total) by mouth daily. Reported on 11/07/2015 08/03/16 11/01/16 Yes Milinda Pointer, MD  methocarbamol (ROBAXIN) 750 MG tablet Take 1 tablet (750 mg total) by mouth 3 (three) times daily as needed for muscle spasms. 08/03/16 11/01/16 Yes Milinda Pointer, MD  pantoprazole  (PROTONIX) 40 MG tablet TAKE 1 TABLET(40 MG) BY MOUTH TWICE DAILY Patient taking differently: TAKE 1 TABLET(40 MG) BY MOUTH ONCE DAILY 05/08/16  Yes Glean Hess, MD  polyethylene glycol powder (GLYCOLAX/MIRALAX) powder MIX AND TAKE 17 GRAMS BY MOUTH DAILY. 01/30/16  Yes Historical Provider, MD  promethazine (PHENERGAN) 25 MG tablet Take 1 tablet (25 mg total) by mouth every 6 (six) hours as needed for nausea or vomiting. 07/30/16 08/29/16 Yes Milinda Pointer, MD  HYDROcodone-acetaminophen (NORCO/VICODIN) 5-325 MG tablet Take 1 tablet by mouth every 8 (eight) hours as needed for severe pain. 09/02/16 10/02/16  Milinda Pointer, MD  HYDROcodone-acetaminophen (NORCO/VICODIN) 5-325 MG tablet Take 1 tablet by mouth every 8 (eight) hours as needed for severe pain. 10/02/16 11/01/16  Milinda Pointer, MD  mirtazapine (REMERON) 15 MG tablet TAKE 1 TABLET(15 MG) BY MOUTH AT BEDTIME Patient not taking: Reported on 08/19/2016 05/02/16   Glean Hess, MD  Wheat Dextrin (BENEFIBER) POWD Stir 2 tsp. TID into 4-8 oz of any non-carbonated beverage or soft food (hot or cold) Patient not taking: Reported on 08/19/2016 08/03/16   Milinda Pointer, MD      VITAL SIGNS:  Blood pressure (!) 145/63, pulse 68, temperature 98.2 F (36.8 C), temperature source Oral, resp. rate 16, SpO2 (!) 85 %.  PHYSICAL EXAMINATION:  GENERAL:  83 y.o.-year-old patient lying in the bed with no acute distress.  EYES: Pupils equal, round, reactive to light and accommodation. No scleral icterus. Extraocular muscles intact.  HEENT: Head atraumatic, normocephalic. Oropharynx and nasopharynx clear.  NECK:  Supple, no jugular venous distention. No thyroid enlargement, no tenderness.  LUNGS: Diminished breath sounds bilaterally, no wheezing, rales,rhonchi . Positive crepitation. No use of accessory muscles of respiration.  CARDIOVASCULAR: S1, S2 normal. No murmurs, rubs, or gallops.  ABDOMEN: Soft, nontender, nondistended. Bowel sounds  present. No organomegaly or mass.  EXTREMITIES: No pedal edema, cyanosis, or clubbing.  NEUROLOGIC: Cranial nerves II through XII are intact. Muscle strength 5/5 in all extremities. Sensation intact. Gait not checked.  PSYCHIATRIC: The patient is alert and oriented x 3.  SKIN: No obvious rash, lesion, or ulcer.   LABORATORY PANEL:   CBC  Recent Labs Lab 08/19/16 1446  WBC 11.9*  HGB 13.7  HCT 40.9  PLT 476*   ------------------------------------------------------------------------------------------------------------------  Chemistries   Recent Labs Lab 08/19/16 1446  NA 136  K 4.1  CL 98*  CO2 31  GLUCOSE 116*  BUN 9  CREATININE 0.88  CALCIUM 8.5*  AST 23  ALT 17  ALKPHOS 69  BILITOT 0.4   ------------------------------------------------------------------------------------------------------------------  Cardiac Enzymes  Recent Labs Lab 08/19/16 1446  TROPONINI <0.03   ------------------------------------------------------------------------------------------------------------------  RADIOLOGY:  Dg Chest Portable 1 View  Result Date: 08/19/2016 CLINICAL DATA:  Cough and fever.  Syncopal episode. EXAM: PORTABLE CHEST 1 VIEW COMPARISON:  Two-view chest x-ray a 12/28/2014 FINDINGS: The heart size is normal. Atherosclerotic calcifications are again seen at the aortic arch. Emphysema  is noted. New right middle lobe airspace disease is evident. The left lung is clear. The visualized soft tissues and bony thorax are unremarkable. IMPRESSION: 1. Right middle lobe pneumonia. 2. Emphysema. 3. Aortic atherosclerosis. Electronically Signed   By: San Morelle M.D.   On: 08/19/2016 15:41    EKG:   Orders placed or performed during the hospital encounter of 08/19/16  . ED EKG  . ED EKG  . EKG 12-Lead  . EKG 12-Lead    IMPRESSION AND PLAN:   Tasha Reyes  is a 83 y.o. female with a known history of COPD, history of DVT, peptic ulcer disease a number, stroke  and multiple other medical problems is presenting to the ED with a chief complaint of shortness of breath. Patient went to the urgent care where her pulse ox was at 86%. Reporting cough with shortness of breath and fever up to 103. Chest x-ray has revealed right middle lobe pneumonia. Patient is orthostatic   #Comminuted acquired pneumonia Admit to MedSurg unit Sputum culture and sensitivity IV Rocephin and azithromycin Symptomatic treatment Antitussive as needed  #Orthostatic hypotension provide IV fluids  #Chronic history of COPD no exacerbation. Continue home dose inhalers and nebulizer's as needed  #Essential hypertension continue home dose atenolol 20 mg by mouth once daily #Hyperlipidemia continue statin #Peptic ulcer disease continue PPI pantoprazole   All the records are reviewed and case discussed with ED provider. Management plans discussed with the patient, family and they are in agreement.  CODE STATUS: fc, son is HCPOA  TOTAL TIME TAKING CARE OF THIS PATIENT: 45 minutes.   Note: This dictation was prepared with Dragon dictation along with smaller phrase technology. Any transcriptional errors that result from this process are unintentional.  Nicholes Mango M.D on 08/19/2016 at 6:57 PM  Between 7am to 6pm - Pager - [phone]  After 6pm go to [email or web address] - password [email or web address] [facility]  Office  [phone]  CC: Primary care physician; Halina Maidens, MD

## 2016-08-20 DIAGNOSIS — E43 Unspecified severe protein-calorie malnutrition: Secondary | ICD-10-CM | POA: Insufficient documentation

## 2016-08-20 LAB — COMPREHENSIVE METABOLIC PANEL
ALT: 13 U/L — AB (ref 14–54)
AST: 17 U/L (ref 15–41)
Albumin: 2.6 g/dL — ABNORMAL LOW (ref 3.5–5.0)
Alkaline Phosphatase: 55 U/L (ref 38–126)
Anion gap: 5 (ref 5–15)
BUN: 8 mg/dL (ref 6–20)
CALCIUM: 7.9 mg/dL — AB (ref 8.9–10.3)
CHLORIDE: 102 mmol/L (ref 101–111)
CO2: 31 mmol/L (ref 22–32)
CREATININE: 0.72 mg/dL (ref 0.44–1.00)
Glucose, Bld: 115 mg/dL — ABNORMAL HIGH (ref 65–99)
Potassium: 3.7 mmol/L (ref 3.5–5.1)
SODIUM: 138 mmol/L (ref 135–145)
Total Bilirubin: 0.4 mg/dL (ref 0.3–1.2)
Total Protein: 5.8 g/dL — ABNORMAL LOW (ref 6.5–8.1)

## 2016-08-20 LAB — CBC
HCT: 34.6 % — ABNORMAL LOW (ref 35.0–47.0)
Hemoglobin: 12.1 g/dL (ref 12.0–16.0)
MCH: 30.4 pg (ref 26.0–34.0)
MCHC: 34.9 g/dL (ref 32.0–36.0)
MCV: 86.9 fL (ref 80.0–100.0)
PLATELETS: 412 10*3/uL (ref 150–440)
RBC: 3.98 MIL/uL (ref 3.80–5.20)
RDW: 14.8 % — AB (ref 11.5–14.5)
WBC: 8.7 10*3/uL (ref 3.6–11.0)

## 2016-08-20 MED ORDER — BENZONATATE 100 MG PO CAPS
100.0000 mg | ORAL_CAPSULE | Freq: Three times a day (TID) | ORAL | Status: DC
  Administered 2016-08-20 – 2016-08-21 (×3): 100 mg via ORAL
  Filled 2016-08-20 (×3): qty 1

## 2016-08-20 MED ORDER — GUAIFENESIN 100 MG/5ML PO SOLN
10.0000 mL | Freq: Four times a day (QID) | ORAL | Status: DC | PRN
  Filled 2016-08-20: qty 10

## 2016-08-20 NOTE — Progress Notes (Signed)
Initial Nutrition Assessment  DOCUMENTATION CODES:   Severe malnutrition in context of acute illness/injury  INTERVENTION:  Recommend liberalizing diet from Heart Healthy to Regular. Paged MD regarding recommendation.  Provide Carnation Instant Breakfast in 2% milk TID on trays, each supplement provides 252 kcal and 13 grams of protein.  Provide snacks po BID between meals. Ordered by RD.  NUTRITION DIAGNOSIS:   Inadequate oral intake related to poor appetite, nausea, other (see comment) (abdominal pain) as evidenced by per patient/family report.  GOAL:   Patient will meet greater than or equal to 90% of their needs  MONITOR:   PO intake, Supplement acceptance, Labs, I & O's, Weight trends  REASON FOR ASSESSMENT:   Malnutrition Screening Tool    ASSESSMENT:   83 year old female with PMHx of PUD, HTN, COPD, stroke, who presents with shortness of breath found to have PNA.    Spoke with patient at bedside. She reports she has had a poor appetite for two weeks now in setting of nausea, abdominal pain, and diarrhea (diarrhea has now improved). Patient denies difficulty chewing/swallowing. She has only been having 2-3 bites of her meals and drinking milk the past 2 weeks. Typical intake is 3 meals daily. Patient is amenable to drinking CIB in milk and having yogurt as snacks between meals.  Patient reports UBW 118 lbs and that she has lost 7 lbs (5.9% weight loss) over 2 weeks. If weights in chart are accurate, patient has not weighed 118 lbs in the past two years and has only lost 2 lbs (1.8% body weight) over 2 weeks, which is not significant for time frame.  Medications reviewed and include: azithromycin, ceftriaxone, magnesium oxide 400 mg daily, pantoprazole, Miralax.  Labs reviewed: Glucose 115.   Nutrition-Focused physical exam completed. Findings are moderate fat depletion, moderate-severe muscle depletion, and no edema.   Discussed with RN.   Diet Order:  Diet Heart  Room service appropriate? Yes; Fluid consistency: Thin  Skin:  Reviewed, no issues  Last BM:  08/18/2016  Height:   Ht Readings from Last 1 Encounters:  08/19/16 '5\' 5"'$  (1.651 m)    Weight:   Wt Readings from Last 1 Encounters:  08/19/16 111 lb 3.2 oz (50.4 kg)    Ideal Body Weight:  56.8 kg  BMI:  Body mass index is 18.5 kg/m.  Estimated Nutritional Needs:   Kcal:  1230-1430 (HBE x 1.2-1.4)  Protein:  60-70 grams (1.2-1.4 grams/kg)  Fluid:  1.25 L/day (25 ml/kg)  EDUCATION NEEDS:   No education needs identified at this time  Willey Blade, MS, RD, LDN Pager: [phone] After Hours Pager: [phone]

## 2016-08-20 NOTE — Progress Notes (Signed)
[facility] at [place] NAME: Tasha Reyes    MR#:  [id number]  DATE OF BIRTH:  June 24, 1931  SUBJECTIVE:  Feels better today. Some cough  REVIEW OF SYSTEMS:   Review of Systems  Constitutional: Negative for chills, fever and weight loss.  HENT: Negative for ear discharge, ear pain and nosebleeds.   Eyes: Negative for blurred vision, pain and discharge.  Respiratory: Positive for cough, sputum production and shortness of breath. Negative for wheezing and stridor.   Cardiovascular: Negative for chest pain, palpitations, orthopnea and PND.  Gastrointestinal: Negative for abdominal pain, diarrhea, nausea and vomiting.  Genitourinary: Negative for frequency and urgency.  Musculoskeletal: Negative for back pain and joint pain.  Neurological: Positive for weakness. Negative for sensory change, speech change and focal weakness.  Psychiatric/Behavioral: Negative for depression and hallucinations. The patient is not nervous/anxious.    Tolerating Diet: Tolerating PT:   DRUG ALLERGIES:  No Known Allergies  VITALS:  Blood pressure (!) 131/51, pulse 62, temperature 98.8 F (37.1 C), temperature source Oral, resp. rate 16, height '5\' 5"'$  (1.651 m), weight 50.4 kg (111 lb 3.2 oz), SpO2 91 %.  PHYSICAL EXAMINATION:   Physical Exam  GENERAL:  83 y.o.-year-old patient lying in the bed with no acute distress. Thin cachectic EYES: Pupils equal, round, reactive to light and accommodation. No scleral icterus. Extraocular muscles intact.  HEENT: Head atraumatic, normocephalic. Oropharynx and nasopharynx clear.  NECK:  Supple, no jugular venous distention. No thyroid enlargement, no tenderness.  LUNGS: Normal breath sounds bilaterally, no wheezing, rales, rhonchi. No use of accessory muscles of respiration.  CARDIOVASCULAR: S1, S2 normal. No murmurs, rubs, or gallops.  ABDOMEN: Soft, nontender, nondistended. Bowel sounds present. No organomegaly or  mass.  EXTREMITIES: No cyanosis, clubbing or edema b/l.    NEUROLOGIC: Cranial nerves II through XII are intact. No focal Motor or sensory deficits b/l.   PSYCHIATRIC:  patient is alert and oriented x 3.  SKIN: No obvious rash, lesion, or ulcer.   LABORATORY PANEL:  CBC  Recent Labs Lab 08/20/16 0518  WBC 8.7  HGB 12.1  HCT 34.6*  PLT 412    Chemistries   Recent Labs Lab 08/20/16 0518  NA 138  K 3.7  CL 102  CO2 31  GLUCOSE 115*  BUN 8  CREATININE 0.72  CALCIUM 7.9*  AST 17  ALT 13*  ALKPHOS 55  BILITOT 0.4   Cardiac Enzymes  Recent Labs Lab 08/19/16 1446  TROPONINI <0.03   RADIOLOGY:  Dg Chest Portable 1 View  Result Date: 08/19/2016 CLINICAL DATA:  Cough and fever.  Syncopal episode. EXAM: PORTABLE CHEST 1 VIEW COMPARISON:  Two-view chest x-ray a 12/28/2014 FINDINGS: The heart size is normal. Atherosclerotic calcifications are again seen at the aortic arch. Emphysema is noted. New right middle lobe airspace disease is evident. The left lung is clear. The visualized soft tissues and bony thorax are unremarkable. IMPRESSION: 1. Right middle lobe pneumonia. 2. Emphysema. 3. Aortic atherosclerosis. Electronically Signed   By: San Morelle M.D.   On: 08/19/2016 15:41   ASSESSMENT AND PLAN:  Tasha Reyes  is a 83 y.o. female with a known history of COPD, history of DVT, peptic ulcer disease a number, stroke and multiple other medical problems is presenting to the ED with a chief complaint of shortness of breath. Patient went to the urgent care where her pulse ox was at 86%. Reporting cough with shortness of breath and fever up to  103. Chest x-ray has revealed right middle lobe pneumonia. Patient is orthostatic   #Comminuted acquired pneumonia IV Rocephin and azithromycin Symptomatic treatment Antitussive as needed  #Chronic history of COPD no exacerbation. Continue home dose inhalers and nebulizer's as needed  #Essential hypertension continue home  dose atenolol 20 mg by mouth once daily  #Hyperlipidemia continue statin  #Peptic ulcer disease continue PPI pantoprazole  # severe Protein calorie malnutrition -follow dietitian recommendation  Case discussed with Care Management/Social Worker. Management plans discussed with the patient, family and they are in agreement.  CODE STATUS:full DVT Prophylaxis: lovenox  TOTAL TIME TAKING CARE OF THIS PATIENT:1mnutes.  >50% time spent on counselling and coordination of care  POSSIBLE D/C IN 1 DAYS, DEPENDING ON CLINICAL CONDITION.  Note: This dictation was prepared with Dragon dictation along with smaller phrase technology. Any transcriptional errors that result from this process are unintentional.  Gizelle Whetsel M.D on 08/20/2016 at 7:36 PM  Between 7am to 6pm - Pager - [phone]  After 6pm go to [email or web address] - password [email or web address] [facility]  Office  [phone] CC: Primary care physician; LHalina Maidens MD

## 2016-08-21 ENCOUNTER — Other Ambulatory Visit: Payer: Self-pay | Admitting: Internal Medicine

## 2016-08-21 MED ORDER — TIOTROPIUM BROMIDE MONOHYDRATE 18 MCG IN CAPS
18.0000 ug | ORAL_CAPSULE | Freq: Every day | RESPIRATORY_TRACT | Status: DC
  Administered 2016-08-21: 09:00:00 18 ug via RESPIRATORY_TRACT
  Filled 2016-08-21: qty 5

## 2016-08-21 MED ORDER — ALBUTEROL SULFATE (2.5 MG/3ML) 0.083% IN NEBU: 2.5000 mg | INHALATION_SOLUTION | RESPIRATORY_TRACT | 12 refills | Status: DC | PRN

## 2016-08-21 MED ORDER — IPRATROPIUM-ALBUTEROL 20-100 MCG/ACT IN AERS: 1.0000 | INHALATION_SPRAY | Freq: Four times a day (QID) | RESPIRATORY_TRACT | 0 refills | Status: DC | PRN

## 2016-08-21 MED ORDER — CEFUROXIME AXETIL 500 MG PO TABS
500.0000 mg | ORAL_TABLET | Freq: Two times a day (BID) | ORAL | Status: DC
  Administered 2016-08-21: 500 mg via ORAL
  Filled 2016-08-21: qty 1

## 2016-08-21 MED ORDER — BENZONATATE 100 MG PO CAPS: 100.0000 mg | ORAL_CAPSULE | Freq: Three times a day (TID) | ORAL | 0 refills | Status: DC

## 2016-08-21 MED ORDER — TIOTROPIUM BROMIDE MONOHYDRATE 18 MCG IN CAPS: 18.0000 ug | ORAL_CAPSULE | Freq: Every day | RESPIRATORY_TRACT | 12 refills | Status: DC

## 2016-08-21 MED ORDER — AZITHROMYCIN 250 MG PO TABS: 250.0000 mg | ORAL_TABLET | Freq: Every day | ORAL | 0 refills | Status: DC

## 2016-08-21 MED ORDER — GUAIFENESIN 100 MG/5ML PO SOLN: 10.0000 mL | Freq: Four times a day (QID) | ORAL | 0 refills | Status: DC | PRN

## 2016-08-21 MED ORDER — IPRATROPIUM-ALBUTEROL 20-100 MCG/ACT IN AERS
1.0000 | INHALATION_SPRAY | Freq: Four times a day (QID) | RESPIRATORY_TRACT | Status: DC | PRN
  Filled 2016-08-21: qty 4

## 2016-08-21 MED ORDER — CEFUROXIME AXETIL 500 MG PO TABS: 500.0000 mg | ORAL_TABLET | Freq: Two times a day (BID) | ORAL | 0 refills | Status: DC

## 2016-08-21 NOTE — Progress Notes (Addendum)
SATURATION QUALIFICATIONS: (This note is used to comply with regulatory documentation for home oxygen)  Patient Saturations on Room Air at Rest = 84%  Put patient on 2L O2 and she is 95% at rest.

## 2016-08-21 NOTE — Care Management Note (Addendum)
Case Management Note  Patient Details  Name: Tasha Reyes MRN: [id number] Date of Birth: 01-Sep-1930  Subjective/Objective:  Met with patient at bedside to discuss discharge planning. Offered choice of home health agencies. Referral to [facility]  for nursing. New O2 and nebulizer need. Ordered both from [facility]. Patient lives at home alone. PCP is Oneita Kras. Patient denies difficulty paying for medications.  Patient agreeable to POC. Primary nurse, Stanton Kidney updated.                  Action/Plan: O2 and nebulizer from [facility]. [facility] for home health nursing  Expected Discharge Date:  08/21/16               Expected Discharge Plan:  [place]  In-House Referral:     Discharge planning Services  CM Consult  Post Acute Care Choice:  Durable Medical Equipment, Home Health Choice offered to:  Patient  DME Arranged:  Oxygen, Nebulizer machine DME Agency:  [place]:  RN [facility] Agency:  [facility]  Status of Service:  Completed, signed off  If discussed at [place] of Stay Meetings, dates discussed:    Additional Comments:  Jolly Mango, RN 08/21/2016, 12:40 PM

## 2016-08-21 NOTE — Discharge Instructions (Signed)
Use inhalers and nebs as instructed Stop smoking!

## 2016-08-21 NOTE — Progress Notes (Signed)
Patient d/ced home.  She qualified for home oxygen and that has been set up along with a nebulizer. She was 84% at rest.   Patient seems depressed today. She is withdrawn and not engaging.  Didn't want to walk.  Poor PO intake.  F/u appt will be made by patient.  IV removed and d/c instructions reviewed.

## 2016-08-21 NOTE — Discharge Summary (Addendum)
Tasha Reyes at [facility] NAME: Tasha Reyes    MR#:  [id number]  DATE OF BIRTH:  1930-10-02  DATE OF ADMISSION:  08/19/2016 ADMITTING PHYSICIAN: Tasha Mango, MD  DATE OF DISCHARGE: 08/21/16  PRIMARY CARE PHYSICIAN: Tasha Maidens, MD    ADMISSION DIAGNOSIS:  Hypoxia [R09.02] Community acquired pneumonia of right middle lobe of lung ([place]) [J18.1]  DISCHARGE DIAGNOSIS:  Community acquired Pneumonia Emphysema with tobacco abuse Acute hypoxic respiratory failure due to pneumonia and emphysema-now on oxygen  SECONDARY DIAGNOSIS:   Past Medical History:  Diagnosis Date  . Aneurysm of iliac artery (HCC) 05/11/2015  . COPD (chronic obstructive pulmonary disease) ([place])   . Cystitis   . History of DVT (deep vein thrombosis) 05/11/2015  . History of peptic ulcer disease 05/11/2015  . [name]   . Hypertension   . Migraines   . [name]   . Stroke ([place])   . Vascular disease     HOSPITAL COURSE:   Tasha Reyes a 83 y.o. femalewith a known history of COPD, history of DVT, peptic ulcer disease a number, stroke and multiple other medical problems is presenting to the ED with a chief complaint of shortness of breath. Patient went to the urgent care where her pulse ox was at 86%. Reporting cough with shortness of breath and fever up to 103. Chest x-ray has revealed right middle lobe pneumonia. Patient is orthostatic   #Comminuted acquired pneumonia IV Rocephin and azithromycin---change to oral abxs Symptomatic treatment Antitussive as needed  #Acute hypoxic respiratory failure due to pneumonia and Chronic history of COPD no exacerbation. Continue home dose inhalers and nebulizer's as needed Assess for home oxygen need--pt qualifies for home oxygen  #Essential hypertension continue home dose atenolol 20 mg by mouth once daily  #Hyperlipidemia continue statin  #Peptic ulcer disease continue  PPI pantoprazole  # severe Protein calorie malnutrition -follow dietitian recommendation  Will d/c home later today CONSULTS OBTAINED:    DRUG ALLERGIES:  No Known Allergies  DISCHARGE MEDICATIONS:   Current Discharge Medication List    START taking these medications   Details  albuterol (PROVENTIL) (2.5 MG/3ML) 0.083% nebulizer solution Take 3 mLs (2.5 mg total) by nebulization every 4 (four) hours as needed for wheezing or shortness of breath. Qty: 75 mL, Refills: 12    azithromycin (ZITHROMAX) 250 MG tablet Take 1 tablet (250 mg total) by mouth daily. Qty: 4 tablet, Refills: 0    benzonatate (TESSALON) 100 MG capsule Take 1 capsule (100 mg total) by mouth 3 (three) times daily. Qty: 20 capsule, Refills: 0    cefUROXime (CEFTIN) 500 MG tablet Take 1 tablet (500 mg total) by mouth 2 (two) times daily with a meal. Qty: 8 tablet, Refills: 0    guaiFENesin (ROBITUSSIN) 100 MG/5ML SOLN Take 10 mLs (200 mg total) by mouth every 6 (six) hours as needed for cough or to loosen phlegm. Qty: 1200 mL, Refills: 0    Ipratropium-Albuterol (COMBIVENT) 20-100 MCG/ACT AERS respimat Inhale 1 puff into the lungs every 6 (six) hours as needed for wheezing. Qty: 1 Inhaler, Refills: 0    tiotropium (SPIRIVA) 18 MCG inhalation capsule Place 1 capsule (18 mcg total) into inhaler and inhale daily. Qty: 30 capsule, Refills: 12      CONTINUE these medications which have NOT CHANGED   Details  atorvastatin (LIPITOR) 10 MG tablet TAKE 1 TABLET BY MOUTH EVERY NIGHT AT BEDTIME Qty: 90 tablet, Refills: 2  Cholecalciferol (VITAMIN D3) 2000 units capsule Take 1 capsule (2,000 Units total) by mouth daily. Qty: 30 capsule, Refills: PRN   Associated Diagnoses: Vitamin D deficiency    clopidogrel (PLAVIX) 75 MG tablet Take 1 tablet by mouth daily.    ferrous sulfate 325 (65 FE) MG tablet TAKE 1 TABLET(325 MG) BY MOUTH DAILY Qty: 30 tablet, Refills: 5    !! HYDROcodone-acetaminophen  (NORCO/VICODIN) 5-325 MG tablet Take 1 tablet by mouth every 8 (eight) hours as needed for severe pain. Qty: 90 tablet, Refills: 0   Associated Diagnoses: Chronic pain syndrome    MAGNESIUM-OXIDE 400 (241.3 Mg) MG tablet Take 1 tablet (400 mg total) by mouth daily. Reported on 11/07/2015 Qty: 90 tablet, Refills: 0   Associated Diagnoses: Gastroesophageal reflux disease without esophagitis    methocarbamol (ROBAXIN) 750 MG tablet Take 1 tablet (750 mg total) by mouth 3 (three) times daily as needed for muscle spasms. Qty: 90 tablet, Refills: 2   Associated Diagnoses: Musculoskeletal pain    pantoprazole (PROTONIX) 40 MG tablet TAKE 1 TABLET(40 MG) BY MOUTH TWICE DAILY Qty: 60 tablet, Refills: 5    polyethylene glycol powder (GLYCOLAX/MIRALAX) powder MIX AND TAKE 17 GRAMS BY MOUTH DAILY. Refills: 5    promethazine (PHENERGAN) 25 MG tablet Take 1 tablet (25 mg total) by mouth every 6 (six) hours as needed for nausea or vomiting. Qty: 30 tablet, Refills: 0   Associated Diagnoses: Nausea and vomiting, intractability of vomiting not specified, unspecified vomiting type    atenolol (TENORMIN) 25 MG tablet TAKE 1 TABLET BY MOUTH DAILY Qty: 30 tablet, Refills: 0    !! HYDROcodone-acetaminophen (NORCO/VICODIN) 5-325 MG tablet Take 1 tablet by mouth every 8 (eight) hours as needed for severe pain. Qty: 90 tablet, Refills: 0   Associated Diagnoses: Chronic pain syndrome    mirtazapine (REMERON) 15 MG tablet TAKE 1 TABLET(15 MG) BY MOUTH AT BEDTIME Qty: 30 tablet, Refills: 5    Wheat Dextrin (BENEFIBER) POWD Stir 2 tsp. TID into 4-8 oz of any non-carbonated beverage or soft food (hot or cold) Qty: 500 g, Refills: 2   Associated Diagnoses: Therapeutic opioid-induced constipation (OIC)     !! - Potential duplicate medications found. Please discuss with provider.      If you experience worsening of your admission symptoms, develop shortness of breath, life threatening emergency, suicidal or  homicidal thoughts you must seek medical attention immediately by calling 911 or calling your MD immediately  if symptoms less severe.  You Must read complete instructions/literature along with all the possible adverse reactions/side effects for all the Medicines you take and that have been prescribed to you. Take any new Medicines after you have completely understood and accept all the possible adverse reactions/side effects.   Please note  You were cared for by a hospitalist during your hospital stay. If you have any questions about your discharge medications or the care you received while you were in the hospital after you are discharged, you can call the unit and asked to speak with the hospitalist on call if the hospitalist that took care of you is not available. Once you are discharged, your primary care physician will handle any further medical issues. Please note that NO REFILLS for any discharge medications will be authorized once you are discharged, as it is imperative that you return to your primary care physician (or establish a relationship with a primary care physician if you do not have one) for your aftercare needs so that they can reassess  your need for medications and monitor your lab values. Today   SUBJECTIVE   Feels weak  VITAL SIGNS:  Blood pressure 118/60, pulse 68, temperature 98.3 F (36.8 C), temperature source Oral, resp. rate 20, height '5\' 5"'$  (1.651 m), weight 50.4 kg (111 lb 3.2 oz), SpO2 92 %.  I/O:   Intake/Output Summary (Last 24 hours) at 08/21/16 0911 Last data filed at 08/20/16 1700  Gross per 24 hour  Intake              120 ml  Output                0 ml  Net              120 ml    PHYSICAL EXAMINATION:  GENERAL:  83 y.o.-year-old patient lying in the bed with no acute distress. Thin,cachectic EYES: Pupils equal, round, reactive to light and accommodation. No scleral icterus. Extraocular muscles intact.  HEENT: Head atraumatic, normocephalic.  Oropharynx and nasopharynx clear.  NECK:  Supple, no jugular venous distention. No thyroid enlargement, no tenderness.  LUNGS: distant breath sounds bilaterally, no wheezing, rales,rhonchi or crepitation. No use of accessory muscles of respiration.  CARDIOVASCULAR: S1, S2 normal. No murmurs, rubs, or gallops.  ABDOMEN: Soft, non-tender, non-distended. Bowel sounds present. No organomegaly or mass.  EXTREMITIES: No pedal edema, cyanosis, or clubbing.  NEUROLOGIC: Cranial nerves II through XII are intact. Muscle strength 5/5 in all extremities. Sensation intact. Gait not checked.  PSYCHIATRIC: The patient is alert and oriented x 3.  SKIN: No obvious rash, lesion, or ulcer.   DATA REVIEW:   CBC   Recent Labs Lab 08/20/16 0518  WBC 8.7  HGB 12.1  HCT 34.6*  PLT 412    Chemistries   Recent Labs Lab 08/20/16 0518  NA 138  K 3.7  CL 102  CO2 31  GLUCOSE 115*  BUN 8  CREATININE 0.72  CALCIUM 7.9*  AST 17  ALT 13*  ALKPHOS 55  BILITOT 0.4    Microbiology Results   No results found for this or any previous visit (from the past 240 hour(s)).  RADIOLOGY:  Dg Chest Portable 1 View  Result Date: 08/19/2016 CLINICAL DATA:  Cough and fever.  Syncopal episode. EXAM: PORTABLE CHEST 1 VIEW COMPARISON:  Two-view chest x-ray a 12/28/2014 FINDINGS: The heart size is normal. Atherosclerotic calcifications are again seen at the aortic arch. Emphysema is noted. New right middle lobe airspace disease is evident. The left lung is clear. The visualized soft tissues and bony thorax are unremarkable. IMPRESSION: 1. Right middle lobe pneumonia. 2. Emphysema. 3. Aortic atherosclerosis. Electronically Signed   By: San Morelle M.D.   On: 08/19/2016 15:41     Management plans discussed with the patient, family and they are in agreement.  CODE STATUS:     Code Status Orders        Start     Ordered   08/19/16 2015  Full code  Continuous     08/19/16 2014    Code Status History     Date Active Date Inactive Code Status Order ID Comments User Context   This patient has a current code status but no historical code status.    Advance Directive Documentation   Flowsheet Row Most Recent Value  Type of Advance Directive  Healthcare Power of Attorney, Living will  Pre-existing out of facility DNR order (yellow form or pink MOST form)  No data  "MOST" Form in Place?  No data      TOTAL TIME TAKING CARE OF THIS PATIENT: 40 minutes.    Aristeo Hankerson M.D on 08/21/2016 at 9:11 AM  Between 7am to 6pm - Pager - [phone] After 6pm go to [email or web address] - password [email or web address] [facility]  Office  [phone]  CC: Primary care physician; Tasha Maidens, MD

## 2016-08-22 DIAGNOSIS — I739 Peripheral vascular disease, unspecified: Secondary | ICD-10-CM | POA: Diagnosis not present

## 2016-08-22 DIAGNOSIS — G894 Chronic pain syndrome: Secondary | ICD-10-CM | POA: Diagnosis not present

## 2016-08-22 DIAGNOSIS — E785 Hyperlipidemia, unspecified: Secondary | ICD-10-CM | POA: Diagnosis not present

## 2016-08-22 DIAGNOSIS — F329 Major depressive disorder, single episode, unspecified: Secondary | ICD-10-CM | POA: Diagnosis not present

## 2016-08-22 DIAGNOSIS — F17210 Nicotine dependence, cigarettes, uncomplicated: Secondary | ICD-10-CM | POA: Diagnosis not present

## 2016-08-22 DIAGNOSIS — I10 Essential (primary) hypertension: Secondary | ICD-10-CM | POA: Diagnosis not present

## 2016-08-22 DIAGNOSIS — K219 Gastro-esophageal reflux disease without esophagitis: Secondary | ICD-10-CM | POA: Diagnosis not present

## 2016-08-22 DIAGNOSIS — D509 Iron deficiency anemia, unspecified: Secondary | ICD-10-CM | POA: Diagnosis not present

## 2016-08-22 DIAGNOSIS — M810 Age-related osteoporosis without current pathological fracture: Secondary | ICD-10-CM | POA: Diagnosis not present

## 2016-08-22 DIAGNOSIS — J181 Lobar pneumonia, unspecified organism: Secondary | ICD-10-CM | POA: Diagnosis not present

## 2016-08-22 DIAGNOSIS — E43 Unspecified severe protein-calorie malnutrition: Secondary | ICD-10-CM | POA: Diagnosis not present

## 2016-08-22 DIAGNOSIS — Z9981 Dependence on supplemental oxygen: Secondary | ICD-10-CM | POA: Diagnosis not present

## 2016-08-22 DIAGNOSIS — J438 Other emphysema: Secondary | ICD-10-CM | POA: Diagnosis not present

## 2016-08-22 DIAGNOSIS — M792 Neuralgia and neuritis, unspecified: Secondary | ICD-10-CM | POA: Diagnosis not present

## 2016-08-25 ENCOUNTER — Other Ambulatory Visit: Payer: Self-pay | Admitting: Internal Medicine

## 2016-08-28 DIAGNOSIS — E43 Unspecified severe protein-calorie malnutrition: Secondary | ICD-10-CM | POA: Diagnosis not present

## 2016-08-28 DIAGNOSIS — I10 Essential (primary) hypertension: Secondary | ICD-10-CM | POA: Diagnosis not present

## 2016-08-28 DIAGNOSIS — J181 Lobar pneumonia, unspecified organism: Secondary | ICD-10-CM | POA: Diagnosis not present

## 2016-08-28 DIAGNOSIS — J438 Other emphysema: Secondary | ICD-10-CM | POA: Diagnosis not present

## 2016-08-28 DIAGNOSIS — D509 Iron deficiency anemia, unspecified: Secondary | ICD-10-CM | POA: Diagnosis not present

## 2016-08-28 DIAGNOSIS — I739 Peripheral vascular disease, unspecified: Secondary | ICD-10-CM | POA: Diagnosis not present

## 2016-08-30 ENCOUNTER — Telehealth: Payer: Self-pay

## 2016-08-30 ENCOUNTER — Other Ambulatory Visit: Payer: Self-pay

## 2016-08-30 DIAGNOSIS — J181 Lobar pneumonia, unspecified organism: Secondary | ICD-10-CM | POA: Diagnosis not present

## 2016-08-30 DIAGNOSIS — I10 Essential (primary) hypertension: Secondary | ICD-10-CM | POA: Diagnosis not present

## 2016-08-30 DIAGNOSIS — D509 Iron deficiency anemia, unspecified: Secondary | ICD-10-CM | POA: Diagnosis not present

## 2016-08-30 DIAGNOSIS — E43 Unspecified severe protein-calorie malnutrition: Secondary | ICD-10-CM | POA: Diagnosis not present

## 2016-08-30 DIAGNOSIS — I739 Peripheral vascular disease, unspecified: Secondary | ICD-10-CM | POA: Diagnosis not present

## 2016-08-30 DIAGNOSIS — J438 Other emphysema: Secondary | ICD-10-CM | POA: Diagnosis not present

## 2016-08-30 NOTE — Telephone Encounter (Signed)
[facility] Nurse called and stated that pt has been discharged from hospital. Is still having diarrhea every time she urinates, even though she is finished with antibiotics. Pt does not have a hosp follow up appt with you yet, so I transferred her to front to sched that.

## 2016-09-02 ENCOUNTER — Encounter: Payer: Self-pay | Admitting: Internal Medicine

## 2016-09-02 ENCOUNTER — Ambulatory Visit (INDEPENDENT_AMBULATORY_CARE_PROVIDER_SITE_OTHER): Payer: Medicare Other | Admitting: Internal Medicine

## 2016-09-02 VITALS — BP 138/68 | HR 62 | Ht 66.14 in | Wt 112.0 lb

## 2016-09-02 DIAGNOSIS — R300 Dysuria: Secondary | ICD-10-CM

## 2016-09-02 DIAGNOSIS — E43 Unspecified severe protein-calorie malnutrition: Secondary | ICD-10-CM | POA: Diagnosis not present

## 2016-09-02 DIAGNOSIS — J431 Panlobular emphysema: Secondary | ICD-10-CM

## 2016-09-02 DIAGNOSIS — J181 Lobar pneumonia, unspecified organism: Secondary | ICD-10-CM | POA: Diagnosis not present

## 2016-09-02 DIAGNOSIS — J189 Pneumonia, unspecified organism: Secondary | ICD-10-CM

## 2016-09-02 LAB — POCT URINALYSIS DIPSTICK
Bilirubin, UA: NEGATIVE
GLUCOSE UA: NEGATIVE
Ketones, UA: NEGATIVE
LEUKOCYTES UA: NEGATIVE
NITRITE UA: NEGATIVE
Protein, UA: NEGATIVE
RBC UA: NEGATIVE
Spec Grav, UA: 1.01
UROBILINOGEN UA: 0.2
pH, UA: 5

## 2016-09-02 MED ORDER — MIRTAZAPINE 15 MG PO TABS: ORAL_TABLET | ORAL | 5 refills | Status: DC

## 2016-09-02 MED ORDER — BENZONATATE 100 MG PO CAPS: 100.0000 mg | ORAL_CAPSULE | Freq: Two times a day (BID) | ORAL | 2 refills | Status: DC | PRN

## 2016-09-02 NOTE — Patient Instructions (Addendum)
Start using Spiriva - one capsule per day inhaled  Continue nebulizer  Restart Mirtazapine at night  Wear the oxygen as much as you can tolerate it.  Especially use it when you go out of the house.  Return in 3 weeks for office visit and CXR

## 2016-09-02 NOTE — Progress Notes (Signed)
Date:  09/02/2016   Name:  Tasha Reyes   DOB:  04/03/1931   MRN:  [id number]   Chief Complaint: Hospitalization Follow-up (Pt states she is still having difficulty breathing, and feels weak.) Admitted to [facility] 08/19/16 to 08/21/16 with community acquired pneumonia of the RML.  She was discharged to home on O2, zithromax, ceftin and tessalon.  Other medications were unchanged. Home health nurse has been visiting.  She does not like the O2 concentrator - she gets a headache.  Son turned it down to ?1 lpm which is better.  She is here without portable O2 today.  She is still short of breath and home O2 is about 88%. She was prescribed Combivent and Spiriva.  She did not get combivent due to cost.  She got Spiriva but has not been taking it.  She is using Duonebs tid.  It is unclear - but I think she also did not get the Ceftin.  She was having loose stools until 2 days ago - now normal after eating yogurt daily.  Dysuria - still having symptoms.  This was not checked at [facility].  She has urgency but no hematuria.  Decreased appetite - pt has been on mirtazapine until October.  At that time she says the pharmacy told her that I refused to refill it.  She was up to 116 lbs but is now down again to 112 lb.  Her appetite has decreased and her sleep is not as restful.  Review of Systems  Constitutional: Positive for appetite change, fatigue and unexpected weight change (lost more weight). Negative for chills and fever.  Eyes: Negative for visual disturbance.  Respiratory: Positive for cough and shortness of breath. Negative for chest tightness and wheezing.   Cardiovascular: Negative for chest pain, palpitations and leg swelling.  Gastrointestinal: Negative for abdominal pain, constipation and diarrhea (resolved).  Genitourinary: Positive for frequency. Negative for dysuria, hematuria and urgency.  Skin: Negative for rash.  Neurological: Positive for headaches. Negative for dizziness, tremors and  syncope.  Psychiatric/Behavioral: Positive for sleep disturbance. Negative for dysphoric mood.    Patient Active Problem List   Diagnosis Date Noted  . Protein-calorie malnutrition, severe 08/20/2016  . PNA (pneumonia) 08/19/2016  . Nausea and vomiting 07/30/2016  . Vitamin D deficiency 01/10/2016  . Opioid-induced constipation (OIC) 11/07/2015  . Osteoporosis, post-menopausal 11/07/2015  . GERD (gastroesophageal reflux disease) 11/07/2015  . Lumbar foraminal stenosis (Right) (Severe at L4-5) 06/20/2015  . Lumbosacral Radiculopathy (Right) 05/11/2015  . Chronic radicular lumbar pain (Right) 05/11/2015  . Long term prescription opiate use 05/11/2015  . Peripheral vascular disease ([place]) 05/11/2015  . Lumbar spondylosis 05/11/2015  . Hx of long term use of blood thinners (Plavix) 05/11/2015  . Neurogenic pain 05/11/2015  . History of DVT (deep vein thrombosis) 05/11/2015  . Lumbar facet hypertrophy 05/11/2015  . Dyslipidemia 12/23/2014  . Compulsive tobacco user syndrome 12/23/2014  . Chronic peptic ulcer with bleeding 12/23/2014  . Essential (primary) hypertension 12/23/2014  . Depression, major, recurrent, in partial remission ([place]) 12/23/2014  . Alveolar emphysema of lung ([place]) 12/23/2014  . Vascular disorder of lower extremity 12/23/2014  . Restless leg 12/23/2014  . Chronic tension-type headache, intractable 12/23/2014    Prior to Admission medications   Medication Sig Start Date End Date Taking? Authorizing Provider  albuterol (PROVENTIL) (2.5 MG/3ML) 0.083% nebulizer solution Take 3 mLs (2.5 mg total) by nebulization every 4 (four) hours as needed for wheezing or shortness of breath. 08/21/16  Yes Fritzi Mandes, MD  atorvastatin (LIPITOR) 10 MG tablet TAKE 1 TABLET BY MOUTH EVERY NIGHT AT BEDTIME 08/26/16  Yes Glean Hess, MD  Cholecalciferol (VITAMIN D3) 2000 units capsule Take 1 capsule (2,000 Units total) by mouth daily. 08/03/16  Yes Milinda Pointer, MD  clopidogrel  (PLAVIX) 75 MG tablet Take 1 tablet by mouth daily.   Yes Historical Provider, MD  ferrous sulfate 325 (65 FE) MG tablet TAKE 1 TABLET(325 MG) BY MOUTH DAILY 06/05/16  Yes Glean Hess, MD  HYDROcodone-acetaminophen (NORCO/VICODIN) 5-325 MG tablet Take 1 tablet by mouth every 8 (eight) hours as needed for severe pain. 08/03/16 09/02/16 Yes Milinda Pointer, MD  Ipratropium-Albuterol (COMBIVENT) 20-100 MCG/ACT AERS respimat Inhale 1 puff into the lungs every 6 (six) hours as needed for wheezing. 08/21/16  Yes Fritzi Mandes, MD  MAGNESIUM-OXIDE 400 (241.3 Mg) MG tablet Take 1 tablet (400 mg total) by mouth daily. Reported on 11/07/2015 08/03/16 11/01/16 Yes Milinda Pointer, MD  methocarbamol (ROBAXIN) 750 MG tablet Take 1 tablet (750 mg total) by mouth 3 (three) times daily as needed for muscle spasms. 08/03/16 11/01/16 Yes Milinda Pointer, MD  pantoprazole (PROTONIX) 40 MG tablet TAKE 1 TABLET(40 MG) BY MOUTH TWICE DAILY Patient taking differently: TAKE 1 TABLET(40 MG) BY MOUTH ONCE DAILY 05/08/16  Yes Glean Hess, MD  tiotropium (SPIRIVA) 18 MCG inhalation capsule Place 1 capsule (18 mcg total) into inhaler and inhale daily. 08/21/16  Yes Fritzi Mandes, MD  atenolol (TENORMIN) 25 MG tablet TAKE 1 TABLET BY MOUTH DAILY Patient not taking: Reported on 09/02/2016 08/21/16   Glean Hess, MD  benzonatate (TESSALON) 100 MG capsule Take 1 capsule (100 mg total) by mouth 3 (three) times daily. Patient not taking: Reported on 09/02/2016 08/21/16   Fritzi Mandes, MD  cefUROXime (CEFTIN) 500 MG tablet Take 1 tablet (500 mg total) by mouth 2 (two) times daily with a meal. Patient not taking: Reported on 09/02/2016 08/21/16   Fritzi Mandes, MD  guaiFENesin (ROBITUSSIN) 100 MG/5ML SOLN Take 10 mLs (200 mg total) by mouth every 6 (six) hours as needed for cough or to loosen phlegm. Patient not taking: Reported on 09/02/2016 08/21/16   Fritzi Mandes, MD  HYDROcodone-acetaminophen (NORCO/VICODIN) 5-325 MG tablet Take 1 tablet by mouth  every 8 (eight) hours as needed for severe pain. Patient not taking: Reported on 09/02/2016 09/02/16 10/02/16  Milinda Pointer, MD  mirtazapine (REMERON) 15 MG tablet TAKE 1 TABLET(15 MG) BY MOUTH AT BEDTIME Patient not taking: Reported on 08/19/2016 05/02/16   Glean Hess, MD  polyethylene glycol powder (GLYCOLAX/MIRALAX) powder MIX AND TAKE 17 GRAMS BY MOUTH DAILY. 01/30/16   Historical Provider, MD  promethazine (PHENERGAN) 25 MG tablet Take 1 tablet (25 mg total) by mouth every 6 (six) hours as needed for nausea or vomiting. 07/30/16 08/29/16  Milinda Pointer, MD  Wheat Dextrin (BENEFIBER) POWD Stir 2 tsp. TID into 4-8 oz of any non-carbonated beverage or soft food (hot or cold) Patient not taking: Reported on 08/19/2016 08/03/16   Milinda Pointer, MD    No Known Allergies  Past Surgical History:  Procedure Laterality Date  . ABDOMINAL AORTIC ANEURYSM REPAIR W/ ENDOLUMINAL GRAFT  2008  . ABDOMINAL HYSTERECTOMY    . APPENDECTOMY    . BACK SURGERY    . blood clot      removal right leg  . COLONOSCOPY  2012  . ESOPHAGOGASTRODUODENOSCOPY  04/2013   gastric ulcers  . FEMORAL-POPLITEAL BYPASS GRAFT Right 2008  . PARTIAL  HYSTERECTOMY      Social History  Substance Use Topics  . Smoking status: Current Every Day Smoker    Packs/day: 0.50    Years: 61.00    Types: Cigarettes  . Smokeless tobacco: Never Used  . Alcohol use No     Medication list has been reviewed and updated.   Physical Exam  Constitutional: She is oriented to person, place, and time. She appears well-developed. No distress.  HENT:  Head: Normocephalic and atraumatic.  Neck: Normal range of motion. Neck supple. No thyromegaly present.  Pulmonary/Chest: Effort normal. No respiratory distress. She has no wheezes. She has no rales.  Abdominal: Soft. Normal appearance and bowel sounds are normal. There is no tenderness.  Musculoskeletal: She exhibits no edema.  Neurological: She is alert and oriented to person,  place, and time.  Skin: Skin is warm and dry. No rash noted.  Psychiatric: She has a normal mood and affect. Her behavior is normal. Thought content normal.  Nursing note and vitals reviewed.   BP 138/68   Pulse 62   Ht 5' 6.14" (1.68 m)   Wt 112 lb (50.8 kg)   SpO2 (!) 87%   BMI 18.00 kg/m   Assessment and Plan: 1. Community acquired pneumonia of right middle lobe of lung ([place]) Resume Spiriva; continue nebs Appears clinically improved but with persistent low O2 sats CXR to follow up in 3 weeks - benzonatate (TESSALON) 100 MG capsule; Take 1 capsule (100 mg total) by mouth 2 (two) times daily as needed for cough.  Dispense: 30 capsule; Refill: 2  2. Alveolar emphysema of lung ([place]) Continue duonebs and begin spiriva Encouraged patient to use O2 around the clock but most especially when going out of the house.  3. Dysuria UA negative - pt reassured - POCT urinalysis dipstick  4. Protein-calorie malnutrition, severe Resume mirtazapine (discontinued in October inadvertently) - mirtazapine (REMERON) 15 MG tablet; TAKE 1 TABLET(15 MG) BY MOUTH AT BEDTIME  Dispense: 30 tablet; Refill: Hilda, MD [facility]  09/02/2016

## 2016-09-03 DIAGNOSIS — I739 Peripheral vascular disease, unspecified: Secondary | ICD-10-CM | POA: Diagnosis not present

## 2016-09-03 DIAGNOSIS — J181 Lobar pneumonia, unspecified organism: Secondary | ICD-10-CM | POA: Diagnosis not present

## 2016-09-03 DIAGNOSIS — J438 Other emphysema: Secondary | ICD-10-CM | POA: Diagnosis not present

## 2016-09-03 DIAGNOSIS — D509 Iron deficiency anemia, unspecified: Secondary | ICD-10-CM | POA: Diagnosis not present

## 2016-09-03 DIAGNOSIS — I10 Essential (primary) hypertension: Secondary | ICD-10-CM | POA: Diagnosis not present

## 2016-09-03 DIAGNOSIS — E43 Unspecified severe protein-calorie malnutrition: Secondary | ICD-10-CM | POA: Diagnosis not present

## 2016-09-04 ENCOUNTER — Encounter (INDEPENDENT_AMBULATORY_CARE_PROVIDER_SITE_OTHER): Payer: Medicare Other | Admitting: Internal Medicine

## 2016-09-04 DIAGNOSIS — E43 Unspecified severe protein-calorie malnutrition: Secondary | ICD-10-CM

## 2016-09-04 DIAGNOSIS — J431 Panlobular emphysema: Secondary | ICD-10-CM | POA: Diagnosis not present

## 2016-09-04 DIAGNOSIS — I10 Essential (primary) hypertension: Secondary | ICD-10-CM

## 2016-09-04 DIAGNOSIS — F17200 Nicotine dependence, unspecified, uncomplicated: Secondary | ICD-10-CM

## 2016-09-04 DIAGNOSIS — E785 Hyperlipidemia, unspecified: Secondary | ICD-10-CM

## 2016-09-04 DIAGNOSIS — I739 Peripheral vascular disease, unspecified: Secondary | ICD-10-CM

## 2016-09-04 DIAGNOSIS — K219 Gastro-esophageal reflux disease without esophagitis: Secondary | ICD-10-CM

## 2016-09-04 DIAGNOSIS — J181 Lobar pneumonia, unspecified organism: Secondary | ICD-10-CM

## 2016-09-04 DIAGNOSIS — Z9981 Dependence on supplemental oxygen: Secondary | ICD-10-CM

## 2016-09-04 DIAGNOSIS — F3341 Major depressive disorder, recurrent, in partial remission: Secondary | ICD-10-CM

## 2016-09-04 DIAGNOSIS — M792 Neuralgia and neuritis, unspecified: Secondary | ICD-10-CM

## 2016-09-04 NOTE — Progress Notes (Signed)
Received orders from [facility] in [place] for initial certification of care.  Start of care 08/22/2016 through 10/20/2016. Orders are reviewed, signed, and faxed.

## 2016-09-06 DIAGNOSIS — E43 Unspecified severe protein-calorie malnutrition: Secondary | ICD-10-CM | POA: Diagnosis not present

## 2016-09-06 DIAGNOSIS — I739 Peripheral vascular disease, unspecified: Secondary | ICD-10-CM | POA: Diagnosis not present

## 2016-09-06 DIAGNOSIS — D509 Iron deficiency anemia, unspecified: Secondary | ICD-10-CM | POA: Diagnosis not present

## 2016-09-06 DIAGNOSIS — J438 Other emphysema: Secondary | ICD-10-CM | POA: Diagnosis not present

## 2016-09-06 DIAGNOSIS — J181 Lobar pneumonia, unspecified organism: Secondary | ICD-10-CM | POA: Diagnosis not present

## 2016-09-06 DIAGNOSIS — I10 Essential (primary) hypertension: Secondary | ICD-10-CM | POA: Diagnosis not present

## 2016-09-10 DIAGNOSIS — D509 Iron deficiency anemia, unspecified: Secondary | ICD-10-CM | POA: Diagnosis not present

## 2016-09-10 DIAGNOSIS — J438 Other emphysema: Secondary | ICD-10-CM | POA: Diagnosis not present

## 2016-09-10 DIAGNOSIS — I10 Essential (primary) hypertension: Secondary | ICD-10-CM | POA: Diagnosis not present

## 2016-09-10 DIAGNOSIS — E43 Unspecified severe protein-calorie malnutrition: Secondary | ICD-10-CM | POA: Diagnosis not present

## 2016-09-10 DIAGNOSIS — I739 Peripheral vascular disease, unspecified: Secondary | ICD-10-CM | POA: Diagnosis not present

## 2016-09-10 DIAGNOSIS — J181 Lobar pneumonia, unspecified organism: Secondary | ICD-10-CM | POA: Diagnosis not present

## 2016-09-13 DIAGNOSIS — D509 Iron deficiency anemia, unspecified: Secondary | ICD-10-CM | POA: Diagnosis not present

## 2016-09-13 DIAGNOSIS — J438 Other emphysema: Secondary | ICD-10-CM | POA: Diagnosis not present

## 2016-09-13 DIAGNOSIS — I10 Essential (primary) hypertension: Secondary | ICD-10-CM | POA: Diagnosis not present

## 2016-09-13 DIAGNOSIS — E43 Unspecified severe protein-calorie malnutrition: Secondary | ICD-10-CM | POA: Diagnosis not present

## 2016-09-13 DIAGNOSIS — J181 Lobar pneumonia, unspecified organism: Secondary | ICD-10-CM | POA: Diagnosis not present

## 2016-09-13 DIAGNOSIS — I739 Peripheral vascular disease, unspecified: Secondary | ICD-10-CM | POA: Diagnosis not present

## 2016-09-17 ENCOUNTER — Other Ambulatory Visit: Payer: Self-pay | Admitting: Internal Medicine

## 2016-09-17 DIAGNOSIS — J438 Other emphysema: Secondary | ICD-10-CM | POA: Diagnosis not present

## 2016-09-17 DIAGNOSIS — J181 Lobar pneumonia, unspecified organism: Secondary | ICD-10-CM | POA: Diagnosis not present

## 2016-09-17 DIAGNOSIS — I739 Peripheral vascular disease, unspecified: Secondary | ICD-10-CM | POA: Diagnosis not present

## 2016-09-17 DIAGNOSIS — D509 Iron deficiency anemia, unspecified: Secondary | ICD-10-CM | POA: Diagnosis not present

## 2016-09-17 DIAGNOSIS — E43 Unspecified severe protein-calorie malnutrition: Secondary | ICD-10-CM | POA: Diagnosis not present

## 2016-09-17 DIAGNOSIS — I10 Essential (primary) hypertension: Secondary | ICD-10-CM | POA: Diagnosis not present

## 2016-09-20 DIAGNOSIS — I739 Peripheral vascular disease, unspecified: Secondary | ICD-10-CM | POA: Diagnosis not present

## 2016-09-20 DIAGNOSIS — J181 Lobar pneumonia, unspecified organism: Secondary | ICD-10-CM | POA: Diagnosis not present

## 2016-09-20 DIAGNOSIS — I10 Essential (primary) hypertension: Secondary | ICD-10-CM | POA: Diagnosis not present

## 2016-09-20 DIAGNOSIS — J438 Other emphysema: Secondary | ICD-10-CM | POA: Diagnosis not present

## 2016-09-20 DIAGNOSIS — E43 Unspecified severe protein-calorie malnutrition: Secondary | ICD-10-CM | POA: Diagnosis not present

## 2016-09-20 DIAGNOSIS — D509 Iron deficiency anemia, unspecified: Secondary | ICD-10-CM | POA: Diagnosis not present

## 2016-09-24 ENCOUNTER — Ambulatory Visit: Payer: Medicare Other | Admitting: Internal Medicine

## 2016-09-25 ENCOUNTER — Telehealth: Payer: Self-pay

## 2016-09-25 NOTE — Telephone Encounter (Signed)
Called Tasha Reyes and told her we approve Continued Nurse Visits. This approves for twice a week for 5 weeks.

## 2016-09-25 NOTE — Telephone Encounter (Signed)
Yes, go ahead and give a verbal order.

## 2016-09-25 NOTE — Telephone Encounter (Signed)
Woman named [name] from Nurse care called to see if you would approve to continue pt's skilled nurse visits? I know pt was a no show yesterday... Advice?

## 2016-09-26 ENCOUNTER — Other Ambulatory Visit: Payer: Medicare Other

## 2016-09-28 DIAGNOSIS — I10 Essential (primary) hypertension: Secondary | ICD-10-CM | POA: Diagnosis not present

## 2016-09-28 DIAGNOSIS — J438 Other emphysema: Secondary | ICD-10-CM | POA: Diagnosis not present

## 2016-09-28 DIAGNOSIS — E43 Unspecified severe protein-calorie malnutrition: Secondary | ICD-10-CM | POA: Diagnosis not present

## 2016-09-28 DIAGNOSIS — I739 Peripheral vascular disease, unspecified: Secondary | ICD-10-CM | POA: Diagnosis not present

## 2016-09-28 DIAGNOSIS — D509 Iron deficiency anemia, unspecified: Secondary | ICD-10-CM | POA: Diagnosis not present

## 2016-09-28 DIAGNOSIS — J181 Lobar pneumonia, unspecified organism: Secondary | ICD-10-CM | POA: Diagnosis not present

## 2016-10-02 DIAGNOSIS — J181 Lobar pneumonia, unspecified organism: Secondary | ICD-10-CM | POA: Diagnosis not present

## 2016-10-02 DIAGNOSIS — J438 Other emphysema: Secondary | ICD-10-CM | POA: Diagnosis not present

## 2016-10-02 DIAGNOSIS — I739 Peripheral vascular disease, unspecified: Secondary | ICD-10-CM | POA: Diagnosis not present

## 2016-10-02 DIAGNOSIS — D509 Iron deficiency anemia, unspecified: Secondary | ICD-10-CM | POA: Diagnosis not present

## 2016-10-02 DIAGNOSIS — E43 Unspecified severe protein-calorie malnutrition: Secondary | ICD-10-CM | POA: Diagnosis not present

## 2016-10-02 DIAGNOSIS — I10 Essential (primary) hypertension: Secondary | ICD-10-CM | POA: Diagnosis not present

## 2016-10-04 ENCOUNTER — Ambulatory Visit
Admission: RE | Admit: 2016-10-04 | Discharge: 2016-10-04 | Disposition: A | Discharge status: Home / Self Care | Payer: Medicare Other | Source: Ambulatory Visit | Attending: Internal Medicine | Admitting: Internal Medicine

## 2016-10-04 ENCOUNTER — Ambulatory Visit (INDEPENDENT_AMBULATORY_CARE_PROVIDER_SITE_OTHER): Payer: Medicare Other | Admitting: Internal Medicine

## 2016-10-04 ENCOUNTER — Encounter: Payer: Self-pay | Admitting: Internal Medicine

## 2016-10-04 VITALS — BP 120/66 | HR 71 | Ht 66.0 in | Wt 114.0 lb

## 2016-10-04 DIAGNOSIS — J189 Pneumonia, unspecified organism: Secondary | ICD-10-CM | POA: Diagnosis not present

## 2016-10-04 DIAGNOSIS — J431 Panlobular emphysema: Secondary | ICD-10-CM

## 2016-10-04 DIAGNOSIS — I739 Peripheral vascular disease, unspecified: Secondary | ICD-10-CM | POA: Diagnosis not present

## 2016-10-04 DIAGNOSIS — J438 Other emphysema: Secondary | ICD-10-CM | POA: Diagnosis not present

## 2016-10-04 DIAGNOSIS — J181 Lobar pneumonia, unspecified organism: Secondary | ICD-10-CM | POA: Diagnosis not present

## 2016-10-04 DIAGNOSIS — D509 Iron deficiency anemia, unspecified: Secondary | ICD-10-CM | POA: Diagnosis not present

## 2016-10-04 DIAGNOSIS — J984 Other disorders of lung: Secondary | ICD-10-CM | POA: Insufficient documentation

## 2016-10-04 DIAGNOSIS — I10 Essential (primary) hypertension: Secondary | ICD-10-CM | POA: Diagnosis not present

## 2016-10-04 DIAGNOSIS — E43 Unspecified severe protein-calorie malnutrition: Secondary | ICD-10-CM

## 2016-10-04 NOTE — Progress Notes (Signed)
Date:  10/04/2016   Name:  Tasha Reyes   DOB:  Jun 26, 1931   MRN:  [id number]   Chief Complaint: Follow-up (Pt states doing alot better since last visit. ) Pneumonia  There is no cough or shortness of breath. The current episode started 1 to 4 weeks ago. The problem occurs intermittently. The problem has been gradually improving. Associated symptoms include appetite change (improved). Pertinent negatives include no chest pain, fever or headaches.  She is here to follow up from hospitalization for repeat CXR to document clearing. She feels much better.  She is no longer using O2.  She can not afford Spiriva.  She is using nebulizer once or twice a day. Appetite is better, weight is up 3 lbs.  Wt Readings from Last 3 Encounters:  10/04/16 114 lb (51.7 kg)  09/02/16 112 lb (50.8 kg)  08/19/16 111 lb 3.2 oz (50.4 kg)     Review of Systems  Constitutional: Positive for appetite change (improved). Negative for fatigue and fever.  Eyes: Negative for visual disturbance.  Respiratory: Negative for cough, chest tightness and shortness of breath.   Cardiovascular: Negative for chest pain, palpitations and leg swelling.  Gastrointestinal: Negative for abdominal pain, diarrhea and vomiting.  Genitourinary: Negative for dysuria.  Neurological: Negative for dizziness and headaches.    Patient Active Problem List   Diagnosis Date Noted  . Dependence on supplemental oxygen 09/04/2016  . Protein-calorie malnutrition, severe 08/20/2016  . Pneumonia of right middle lobe due to infectious organism ([place]) 08/19/2016  . Nausea and vomiting 07/30/2016  . Vitamin D deficiency 01/10/2016  . Opioid-induced constipation (OIC) 11/07/2015  . Osteoporosis, post-menopausal 11/07/2015  . GERD (gastroesophageal reflux disease) 11/07/2015  . Lumbar foraminal stenosis (Right) (Severe at L4-5) 06/20/2015  . Lumbosacral Radiculopathy (Right) 05/11/2015  . Chronic radicular lumbar pain (Right) 05/11/2015    . Long term prescription opiate use 05/11/2015  . Peripheral vascular disease ([place]) 05/11/2015  . Lumbar spondylosis 05/11/2015  . Hx of long term use of blood thinners (Plavix) 05/11/2015  . Neurogenic pain 05/11/2015  . History of DVT (deep vein thrombosis) 05/11/2015  . Lumbar facet hypertrophy 05/11/2015  . Dyslipidemia 12/23/2014  . Compulsive tobacco user syndrome 12/23/2014  . Chronic peptic ulcer with bleeding 12/23/2014  . Essential (primary) hypertension 12/23/2014  . Depression, major, recurrent, in partial remission ([place]) 12/23/2014  . Alveolar emphysema of lung ([place]) 12/23/2014  . Vascular disorder of lower extremity 12/23/2014  . Restless leg 12/23/2014  . Chronic tension-type headache, intractable 12/23/2014    Prior to Admission medications   Medication Sig Start Date End Date Taking? Authorizing Provider  albuterol (PROVENTIL) (2.5 MG/3ML) 0.083% nebulizer solution Take 3 mLs (2.5 mg total) by nebulization every 4 (four) hours as needed for wheezing or shortness of breath. 08/21/16  Yes Fritzi Mandes, MD  atenolol (TENORMIN) 25 MG tablet TAKE 1 TABLET BY MOUTH DAILY 09/17/16  Yes Glean Hess, MD  atorvastatin (LIPITOR) 10 MG tablet TAKE 1 TABLET BY MOUTH EVERY NIGHT AT BEDTIME 08/26/16  Yes Glean Hess, MD  benzonatate (TESSALON) 100 MG capsule Take 1 capsule (100 mg total) by mouth 2 (two) times daily as needed for cough. 09/02/16  Yes Glean Hess, MD  Cholecalciferol (VITAMIN D3) 2000 units capsule Take 1 capsule (2,000 Units total) by mouth daily. 08/03/16  Yes Milinda Pointer, MD  clopidogrel (PLAVIX) 75 MG tablet Take 1 tablet by mouth daily.   Yes Historical Provider, MD  ferrous sulfate 325 (  65 FE) MG tablet TAKE 1 TABLET(325 MG) BY MOUTH DAILY 06/05/16  Yes Glean Hess, MD  guaiFENesin (ROBITUSSIN) 100 MG/5ML SOLN Take 10 mLs (200 mg total) by mouth every 6 (six) hours as needed for cough or to loosen phlegm. 08/21/16  Yes Fritzi Mandes, MD   Ipratropium-Albuterol (COMBIVENT) 20-100 MCG/ACT AERS respimat Inhale 1 puff into the lungs every 6 (six) hours as needed for wheezing. 08/21/16  Yes Fritzi Mandes, MD  MAGNESIUM-OXIDE 400 (241.3 Mg) MG tablet Take 1 tablet (400 mg total) by mouth daily. Reported on 11/07/2015 08/03/16 11/01/16 Yes Milinda Pointer, MD  methocarbamol (ROBAXIN) 750 MG tablet Take 1 tablet (750 mg total) by mouth 3 (three) times daily as needed for muscle spasms. 08/03/16 11/01/16 Yes Milinda Pointer, MD  mirtazapine (REMERON) 15 MG tablet TAKE 1 TABLET(15 MG) BY MOUTH AT BEDTIME 09/02/16  Yes Glean Hess, MD  pantoprazole (PROTONIX) 40 MG tablet TAKE 1 TABLET(40 MG) BY MOUTH TWICE DAILY Patient taking differently: TAKE 1 TABLET(40 MG) BY MOUTH ONCE DAILY 05/08/16  Yes Glean Hess, MD  polyethylene glycol powder (GLYCOLAX/MIRALAX) powder MIX AND TAKE 17 GRAMS BY MOUTH DAILY. 01/30/16  Yes Historical Provider, MD  tiotropium (SPIRIVA) 18 MCG inhalation capsule Place 1 capsule (18 mcg total) into inhaler and inhale daily. 08/21/16  Yes Fritzi Mandes, MD  Wheat Dextrin (BENEFIBER) POWD Stir 2 tsp. TID into 4-8 oz of any non-carbonated beverage or soft food (hot or cold) 08/03/16  Yes Milinda Pointer, MD    No Known Allergies  Past Surgical History:  Procedure Laterality Date  . ABDOMINAL AORTIC ANEURYSM REPAIR W/ ENDOLUMINAL GRAFT  2008  . ABDOMINAL HYSTERECTOMY    . APPENDECTOMY    . BACK SURGERY    . blood clot      removal right leg  . COLONOSCOPY  2012  . ESOPHAGOGASTRODUODENOSCOPY  04/2013   gastric ulcers  . FEMORAL-POPLITEAL BYPASS GRAFT Right 2008  . PARTIAL HYSTERECTOMY      Social History  Substance Use Topics  . Smoking status: Current Every Day Smoker    Packs/day: 0.50    Years: 61.00    Types: Cigarettes  . Smokeless tobacco: Never Used  . Alcohol use No     Medication list has been reviewed and updated.   Physical Exam  Constitutional: She is oriented to person, place, and time.  She appears well-developed. No distress.  HENT:  Head: Normocephalic and atraumatic.  Neck: Normal range of motion. Neck supple.  Cardiovascular: Normal rate, regular rhythm and normal heart sounds.   Pulmonary/Chest: Effort normal and breath sounds normal. No respiratory distress. She has no wheezes.  Musculoskeletal: She exhibits no edema.  Neurological: She is alert and oriented to person, place, and time.  Skin: Skin is warm and dry. No rash noted.  Psychiatric: She has a normal mood and affect. Her speech is normal and behavior is normal. Thought content normal.  Nursing note and vitals reviewed.   BP 120/66 (BP Location: Right Arm, Patient Position: Sitting, Cuff Size: Normal)   Pulse 71   Ht '5\' 6"'$  (1.676 m)   Wt 114 lb (51.7 kg)   SpO2 93%   BMI 18.40 kg/m   Assessment and Plan: 1. Pneumonia of right middle lobe due to infectious organism [facility]) Resolved - check CXR for clearing - DG Chest 2 View; Future  2. Alveolar emphysema of lung ([place]) Continue nebulizer as needed  3. Protein-calorie malnutrition, severe Improved weight, mood and sleep on Mirtazepine  No orders of the defined types were placed in this encounter.   Halina Maidens, MD [facility]  10/04/2016

## 2016-10-07 DIAGNOSIS — J438 Other emphysema: Secondary | ICD-10-CM | POA: Diagnosis not present

## 2016-10-07 DIAGNOSIS — I10 Essential (primary) hypertension: Secondary | ICD-10-CM | POA: Diagnosis not present

## 2016-10-07 DIAGNOSIS — I739 Peripheral vascular disease, unspecified: Secondary | ICD-10-CM | POA: Diagnosis not present

## 2016-10-07 DIAGNOSIS — J181 Lobar pneumonia, unspecified organism: Secondary | ICD-10-CM | POA: Diagnosis not present

## 2016-10-07 DIAGNOSIS — E43 Unspecified severe protein-calorie malnutrition: Secondary | ICD-10-CM | POA: Diagnosis not present

## 2016-10-07 DIAGNOSIS — D509 Iron deficiency anemia, unspecified: Secondary | ICD-10-CM | POA: Diagnosis not present

## 2016-10-12 DIAGNOSIS — D509 Iron deficiency anemia, unspecified: Secondary | ICD-10-CM | POA: Diagnosis not present

## 2016-10-12 DIAGNOSIS — J181 Lobar pneumonia, unspecified organism: Secondary | ICD-10-CM | POA: Diagnosis not present

## 2016-10-12 DIAGNOSIS — J438 Other emphysema: Secondary | ICD-10-CM | POA: Diagnosis not present

## 2016-10-12 DIAGNOSIS — E43 Unspecified severe protein-calorie malnutrition: Secondary | ICD-10-CM | POA: Diagnosis not present

## 2016-10-12 DIAGNOSIS — I10 Essential (primary) hypertension: Secondary | ICD-10-CM | POA: Diagnosis not present

## 2016-10-12 DIAGNOSIS — I739 Peripheral vascular disease, unspecified: Secondary | ICD-10-CM | POA: Diagnosis not present

## 2016-10-15 DIAGNOSIS — D509 Iron deficiency anemia, unspecified: Secondary | ICD-10-CM | POA: Diagnosis not present

## 2016-10-15 DIAGNOSIS — E43 Unspecified severe protein-calorie malnutrition: Secondary | ICD-10-CM | POA: Diagnosis not present

## 2016-10-15 DIAGNOSIS — I10 Essential (primary) hypertension: Secondary | ICD-10-CM | POA: Diagnosis not present

## 2016-10-15 DIAGNOSIS — J438 Other emphysema: Secondary | ICD-10-CM | POA: Diagnosis not present

## 2016-10-15 DIAGNOSIS — J181 Lobar pneumonia, unspecified organism: Secondary | ICD-10-CM | POA: Diagnosis not present

## 2016-10-15 DIAGNOSIS — I739 Peripheral vascular disease, unspecified: Secondary | ICD-10-CM | POA: Diagnosis not present

## 2016-10-16 ENCOUNTER — Telehealth: Payer: Self-pay

## 2016-10-16 NOTE — Telephone Encounter (Signed)
Home health Nurse Tiffany called stating that pt has been having high BP readings. She has been taking them electronically, and then retaking them manually to make sure it's correct. She does take pressures when pt is in resting state. She did mention that the pt's daughter moved back in to help, and it may be cause of stress and high readings. After speaking with Dr. Army Melia I told nurse to monitor readings, and if she has symptoms of dizziness or chest pain during these episodes she needs to be seen by ER. If these BP readings continue to be elevated then call office to make appt to be seen for evaluation.

## 2016-10-18 ENCOUNTER — Encounter: Payer: Medicare Other | Admitting: Internal Medicine

## 2016-10-18 DIAGNOSIS — D509 Iron deficiency anemia, unspecified: Secondary | ICD-10-CM | POA: Diagnosis not present

## 2016-10-18 DIAGNOSIS — J181 Lobar pneumonia, unspecified organism: Secondary | ICD-10-CM | POA: Diagnosis not present

## 2016-10-18 DIAGNOSIS — E43 Unspecified severe protein-calorie malnutrition: Secondary | ICD-10-CM | POA: Diagnosis not present

## 2016-10-18 DIAGNOSIS — I739 Peripheral vascular disease, unspecified: Secondary | ICD-10-CM | POA: Diagnosis not present

## 2016-10-18 DIAGNOSIS — I10 Essential (primary) hypertension: Secondary | ICD-10-CM | POA: Diagnosis not present

## 2016-10-18 DIAGNOSIS — J438 Other emphysema: Secondary | ICD-10-CM | POA: Diagnosis not present

## 2016-10-19 DIAGNOSIS — I739 Peripheral vascular disease, unspecified: Secondary | ICD-10-CM | POA: Diagnosis not present

## 2016-10-19 DIAGNOSIS — I10 Essential (primary) hypertension: Secondary | ICD-10-CM | POA: Diagnosis not present

## 2016-10-19 DIAGNOSIS — E43 Unspecified severe protein-calorie malnutrition: Secondary | ICD-10-CM | POA: Diagnosis not present

## 2016-10-19 DIAGNOSIS — J438 Other emphysema: Secondary | ICD-10-CM | POA: Diagnosis not present

## 2016-10-19 DIAGNOSIS — D509 Iron deficiency anemia, unspecified: Secondary | ICD-10-CM | POA: Diagnosis not present

## 2016-10-19 DIAGNOSIS — J181 Lobar pneumonia, unspecified organism: Secondary | ICD-10-CM | POA: Diagnosis not present

## 2016-10-21 DIAGNOSIS — K219 Gastro-esophageal reflux disease without esophagitis: Secondary | ICD-10-CM | POA: Diagnosis not present

## 2016-10-21 DIAGNOSIS — F329 Major depressive disorder, single episode, unspecified: Secondary | ICD-10-CM | POA: Diagnosis not present

## 2016-10-21 DIAGNOSIS — Z9981 Dependence on supplemental oxygen: Secondary | ICD-10-CM | POA: Diagnosis not present

## 2016-10-21 DIAGNOSIS — M792 Neuralgia and neuritis, unspecified: Secondary | ICD-10-CM | POA: Diagnosis not present

## 2016-10-21 DIAGNOSIS — I739 Peripheral vascular disease, unspecified: Secondary | ICD-10-CM | POA: Diagnosis not present

## 2016-10-21 DIAGNOSIS — J438 Other emphysema: Secondary | ICD-10-CM | POA: Diagnosis not present

## 2016-10-21 DIAGNOSIS — G894 Chronic pain syndrome: Secondary | ICD-10-CM | POA: Diagnosis not present

## 2016-10-21 DIAGNOSIS — E785 Hyperlipidemia, unspecified: Secondary | ICD-10-CM | POA: Diagnosis not present

## 2016-10-21 DIAGNOSIS — D509 Iron deficiency anemia, unspecified: Secondary | ICD-10-CM | POA: Diagnosis not present

## 2016-10-21 DIAGNOSIS — F17210 Nicotine dependence, cigarettes, uncomplicated: Secondary | ICD-10-CM | POA: Diagnosis not present

## 2016-10-21 DIAGNOSIS — E43 Unspecified severe protein-calorie malnutrition: Secondary | ICD-10-CM | POA: Diagnosis not present

## 2016-10-21 DIAGNOSIS — I10 Essential (primary) hypertension: Secondary | ICD-10-CM | POA: Diagnosis not present

## 2016-10-21 DIAGNOSIS — M810 Age-related osteoporosis without current pathological fracture: Secondary | ICD-10-CM | POA: Diagnosis not present

## 2016-10-24 ENCOUNTER — Encounter: Payer: Self-pay | Admitting: Pain Medicine

## 2016-10-24 ENCOUNTER — Ambulatory Visit: Payer: Medicare Other | Attending: Pain Medicine | Admitting: Pain Medicine

## 2016-10-24 VITALS — BP 169/81 | HR 80 | Temp 98.4°F | Resp 18 | Ht 66.0 in | Wt 113.0 lb

## 2016-10-24 DIAGNOSIS — M791 Myalgia: Secondary | ICD-10-CM | POA: Diagnosis not present

## 2016-10-24 DIAGNOSIS — E559 Vitamin D deficiency, unspecified: Secondary | ICD-10-CM | POA: Insufficient documentation

## 2016-10-24 DIAGNOSIS — M9983 Other biomechanical lesions of lumbar region: Secondary | ICD-10-CM

## 2016-10-24 DIAGNOSIS — K5900 Constipation, unspecified: Secondary | ICD-10-CM | POA: Diagnosis not present

## 2016-10-24 DIAGNOSIS — M5417 Radiculopathy, lumbosacral region: Secondary | ICD-10-CM | POA: Diagnosis not present

## 2016-10-24 DIAGNOSIS — J440 Chronic obstructive pulmonary disease with acute lower respiratory infection: Secondary | ICD-10-CM | POA: Diagnosis not present

## 2016-10-24 DIAGNOSIS — G894 Chronic pain syndrome: Secondary | ICD-10-CM

## 2016-10-24 DIAGNOSIS — M5416 Radiculopathy, lumbar region: Secondary | ICD-10-CM | POA: Diagnosis not present

## 2016-10-24 DIAGNOSIS — Z13820 Encounter for screening for osteoporosis: Secondary | ICD-10-CM | POA: Diagnosis not present

## 2016-10-24 DIAGNOSIS — R1031 Right lower quadrant pain: Secondary | ICD-10-CM | POA: Diagnosis not present

## 2016-10-24 DIAGNOSIS — G8929 Other chronic pain: Secondary | ICD-10-CM

## 2016-10-24 DIAGNOSIS — G2581 Restless legs syndrome: Secondary | ICD-10-CM | POA: Insufficient documentation

## 2016-10-24 DIAGNOSIS — R112 Nausea with vomiting, unspecified: Secondary | ICD-10-CM | POA: Diagnosis not present

## 2016-10-24 DIAGNOSIS — I10 Essential (primary) hypertension: Secondary | ICD-10-CM | POA: Diagnosis not present

## 2016-10-24 DIAGNOSIS — Z8679 Personal history of other diseases of the circulatory system: Secondary | ICD-10-CM | POA: Diagnosis not present

## 2016-10-24 DIAGNOSIS — M479 Spondylosis, unspecified: Secondary | ICD-10-CM | POA: Insufficient documentation

## 2016-10-24 DIAGNOSIS — R1032 Left lower quadrant pain: Secondary | ICD-10-CM | POA: Insufficient documentation

## 2016-10-24 DIAGNOSIS — M5441 Lumbago with sciatica, right side: Secondary | ICD-10-CM | POA: Diagnosis not present

## 2016-10-24 DIAGNOSIS — E43 Unspecified severe protein-calorie malnutrition: Secondary | ICD-10-CM | POA: Insufficient documentation

## 2016-10-24 DIAGNOSIS — J189 Pneumonia, unspecified organism: Secondary | ICD-10-CM | POA: Diagnosis not present

## 2016-10-24 DIAGNOSIS — M545 Low back pain: Secondary | ICD-10-CM | POA: Diagnosis not present

## 2016-10-24 DIAGNOSIS — M48061 Spinal stenosis, lumbar region without neurogenic claudication: Secondary | ICD-10-CM

## 2016-10-24 DIAGNOSIS — K219 Gastro-esophageal reflux disease without esophagitis: Secondary | ICD-10-CM

## 2016-10-24 DIAGNOSIS — Z79891 Long term (current) use of opiate analgesic: Secondary | ICD-10-CM | POA: Diagnosis not present

## 2016-10-24 DIAGNOSIS — M7918 Myalgia, other site: Secondary | ICD-10-CM

## 2016-10-24 DIAGNOSIS — I739 Peripheral vascular disease, unspecified: Secondary | ICD-10-CM | POA: Insufficient documentation

## 2016-10-24 DIAGNOSIS — M4696 Unspecified inflammatory spondylopathy, lumbar region: Secondary | ICD-10-CM | POA: Diagnosis not present

## 2016-10-24 DIAGNOSIS — Z9981 Dependence on supplemental oxygen: Secondary | ICD-10-CM | POA: Insufficient documentation

## 2016-10-24 DIAGNOSIS — M79604 Pain in right leg: Secondary | ICD-10-CM

## 2016-10-24 DIAGNOSIS — Z7902 Long term (current) use of antithrombotics/antiplatelets: Secondary | ICD-10-CM | POA: Diagnosis not present

## 2016-10-24 DIAGNOSIS — Z86718 Personal history of other venous thrombosis and embolism: Secondary | ICD-10-CM | POA: Insufficient documentation

## 2016-10-24 DIAGNOSIS — M47816 Spondylosis without myelopathy or radiculopathy, lumbar region: Secondary | ICD-10-CM

## 2016-10-24 MED ORDER — HYDROCODONE-ACETAMINOPHEN 5-325 MG PO TABS: 1.0000 | ORAL_TABLET | Freq: Three times a day (TID) | ORAL | 0 refills | Status: DC | PRN

## 2016-10-24 MED ORDER — METHOCARBAMOL 750 MG PO TABS: 750.0000 mg | ORAL_TABLET | Freq: Three times a day (TID) | ORAL | 2 refills | Status: DC | PRN

## 2016-10-24 MED ORDER — MAGNESIUM-OXIDE 400 (241.3 MG) MG PO TABS: 1.0000 | ORAL_TABLET | Freq: Every day | ORAL | 0 refills | Status: DC

## 2016-10-24 NOTE — Patient Instructions (Addendum)
Steps to Quit Smoking Smoking tobacco can be bad for your health. It can also affect almost every organ in your body. Smoking puts you and people around you at risk for many serious long-lasting (chronic) diseases. Quitting smoking is hard, but it is one of the best things that you can do for your health. It is never too late to quit. What are the benefits of quitting smoking? When you quit smoking, you lower your risk for getting serious diseases and conditions. They can include:  Lung cancer or lung disease.  Heart disease.  Stroke.  Heart attack.  Not being able to have children (infertility).  Weak bones (osteoporosis) and broken bones (fractures). If you have coughing, wheezing, and shortness of breath, those symptoms may get better when you quit. You may also get sick less often. If you are pregnant, quitting smoking can help to lower your chances of having a baby of low birth weight. What can I do to help me quit smoking? Talk with your doctor about what can help you quit smoking. Some things you can do (strategies) include:  Quitting smoking totally, instead of slowly cutting back how much you smoke over a period of time.  Going to in-person counseling. You are more likely to quit if you go to many counseling sessions.  Using resources and support systems, such as:  Online chats with a counselor.  Phone quitlines.  Printed self-help materials.  Support groups or group counseling.  Text messaging programs.  Mobile phone apps or applications.  Taking medicines. Some of these medicines may have nicotine in them. If you are pregnant or breastfeeding, do not take any medicines to quit smoking unless your doctor says it is okay. Talk with your doctor about counseling or other things that can help you. Talk with your doctor about using more than one strategy at the same time, such as taking medicines while you are also going to in-person counseling. This can help make quitting  easier. What things can I do to make it easier to quit? Quitting smoking might feel very hard at first, but there is a lot that you can do to make it easier. Take these steps:  Talk to your family and friends. Ask them to support and encourage you.  Call phone quitlines, reach out to support groups, or work with a counselor.  Ask people who smoke to not smoke around you.  Avoid places that make you want (trigger) to smoke, such as:  Bars.  Parties.  Smoke-break areas at work.  Spend time with people who do not smoke.  Lower the stress in your life. Stress can make you want to smoke. Try these things to help your stress:  Getting regular exercise.  Deep-breathing exercises.  Yoga.  Meditating.  Doing a body scan. To do this, close your eyes, focus on one area of your body at a time from head to toe, and notice which parts of your body are tense. Try to relax the muscles in those areas.  Download or buy apps on your mobile phone or tablet that can help you stick to your quit plan. There are many free apps, such as QuitGuide from the CDC (Centers for Disease Control and Prevention). You can find more support from smokefree.gov and other websites. This information is not intended to replace advice given to you by your health care provider. Make sure you discuss any questions you have with your health care provider. Document Released: 04/27/2009 Document Revised: 02/27/2016 Document   Reviewed: 11/15/2014 Elsevier Interactive Patient Education  2017 [employer] Epidural Steroid Injection Patient Information  Description: The epidural space surrounds the nerves as they exit the spinal cord.  In some patients, the nerves can be compressed and inflamed by a bulging disc or a tight spinal canal (spinal stenosis).  By injecting steroids into the epidural space, we can bring irritated nerves into direct contact with a potentially helpful medication.  These steroids act directly on the  irritated nerves and can reduce swelling and inflammation which often leads to decreased pain.  Epidural steroids may be injected anywhere along the spine and from the neck to the low back depending upon the location of your pain.   After numbing the skin with local anesthetic (like Novocaine), a small needle is passed into the epidural space slowly.  You may experience a sensation of pressure while this is being done.  The entire block usually last less than 10 minutes.  Conditions which may be treated by epidural steroids:  Low back and leg pain Neck and arm pain Spinal stenosis Post-laminectomy syndrome Herpes zoster (shingles) pain Pain from compression fractures  Preparation for the injection:  Do not eat any solid food or dairy products within 8 hours of your appointment.  You may drink clear liquids up to 3 hours before appointment.  Clear liquids include water, black coffee, juice or soda.  No milk or cream please. You may take your regular medication, including pain medications, with a sip of water before your appointment  Diabetics should hold regular insulin (if taken separately) and take 1/2 normal NPH dos the morning of the procedure.  Carry some sugar containing items with you to your appointment. A driver must accompany you and be prepared to drive you home after your procedure.  Bring all your current medications with your. An IV may be inserted and sedation may be given at the discretion of the physician.   A blood pressure cuff, EKG and other monitors will often be applied during the procedure.  Some patients may need to have extra oxygen administered for a short period. You will be asked to provide medical information, including your allergies, prior to the procedure.  We must know immediately if you are taking blood thinners (like Coumadin/Warfarin)  Or if you are allergic to IV iodine contrast (dye). We must know if you could possible be pregnant.  Possible  side-effects: Bleeding from needle site Infection (rare, may require surgery) Nerve injury (rare) Numbness & tingling (temporary) Difficulty urinating (rare, temporary) Spinal headache ( a headache worse with upright posture) Light -headedness (temporary) Pain at injection site (several days) Decreased blood pressure (temporary) Weakness in arm/leg (temporary) Pressure sensation in back/neck (temporary)  Call if you experience: Fever/chills associated with headache or increased back/neck pain. Headache worsened by an upright position. New onset weakness or numbness of an extremity below the injection site Hives or difficulty breathing (go to the emergency room) Inflammation or drainage at the infection site Severe back/neck pain Any new symptoms which are concerning to you  Please note:  Although the local anesthetic injected can often make your back or neck feel good for several hours after the injection, the pain will likely return.  It takes 3-7 days for steroids to work in the epidural space.  You may not notice any pain relief for at least that one week.  If effective, we will often do a series of three injections spaced 3-6 weeks apart to maximally decrease your pain.  After the initial series, we generally will wait several months before considering a repeat injection of the same type.  If you have any questions, please call [phone] [facility]  We will sent a fax to Dr. Lucky Cowboy asking for clearance to stop Plavix 7 days or bridge with Lovenox. We will call you after we get a response. You were given 3 prescriptions for Norco today.  Do not eat or drink within 3 hours of your procedure appointment. Take your morning BP meds.

## 2016-10-24 NOTE — Progress Notes (Signed)
Nursing Pain Medication Assessment:  Safety precautions to be maintained throughout the outpatient stay will include: orient to surroundings, keep bed in low position, maintain call bell within reach at all times, provide assistance with transfer out of bed and ambulation.  Medication Inspection Compliance: Pill count conducted under aseptic conditions, in front of the patient. Neither the pills nor the bottle was removed from the patient's sight at any time. Once count was completed pills were immediately returned to the patient in their original bottle.  Medication: Hydrocodone/APAP Pill/Patch Count: 0 of 90 pills remain Pill/Patch Appearance: Markings consistent with prescribed medication Bottle Appearance: Standard pharmacy container. Clearly labeled. Filled Date03 / 01 / 2018 Last Medication intake:  Day before yesterday

## 2016-10-24 NOTE — Progress Notes (Signed)
Patient's Name: Tasha Reyes  MRN: [id number]  Referring Provider: Glean Hess, MD  DOB: January 30, 1931  PCP: Glean Hess, MD  DOS: 10/24/2016  Note by: Kathlen Brunswick. Dossie Arbour, MD  Service setting: Ambulatory outpatient  Specialty: Interventional Pain Management  Location: [facility] (AMB) Pain Management Facility    Patient type: Established   Primary Reason(s) for Visit: Encounter for prescription drug management (Level of risk: moderate) CC: Abdominal Pain (lower bilateral) and Back Pain (lower, bilateral)  HPI  Tasha Reyes is a 83 y.o. year old, female patient, who comes today for a medication management evaluation. She has Dyslipidemia; Compulsive tobacco user syndrome; Chronic peptic ulcer with bleeding; Essential (primary) hypertension; Depression, major, recurrent, in partial remission ([place]); Alveolar emphysema of lung ([place]); Vascular disorder of lower extremity; Restless leg; Chronic tension-type headache, intractable; Chronic low back pain (Location of Secondary source of pain) (Bilateral) (R>L); Chronic lower extremity pain (Location of Primary Source of Pain) (Right); Lumbosacral Radiculopathy (Right); Chronic radicular lumbar pain (Right); Long term current use of opiate analgesic; Long term prescription opiate use; Opiate use; Peripheral vascular disease ([place]); Lumbar spondylosis; Hx of long term use of blood thinners (Plavix); Lower extremity pain (Right); Chronic pain syndrome; Neurogenic pain; History of DVT (deep vein thrombosis); Lumbar facet hypertrophy; Facet syndrome, lumbar ([place]); Lumbar foraminal stenosis (Right) (Severe at L4-5); Opioid-induced constipation ([place]); Osteoporosis, post-menopausal; GERD (gastroesophageal reflux disease); Vitamin D deficiency; Nausea and vomiting; Pneumonia of right middle lobe due to infectious organism [facility]); Protein-calorie malnutrition, severe; and Dependence on supplemental oxygen on her problem list. Her primarily concern today is the  Abdominal Pain (lower bilateral) and Back Pain (lower, bilateral)  Pain Assessment: Self-Reported Pain Score: 3 /10             Reported level is compatible with observation.       Pain Type: Chronic pain Pain Location: Abdomen (back) Pain Orientation: Right, Left  Ms. Red was last scheduled for an appointment on 07/30/2016 for medication management. During today's appointment we reviewed Tasha Reyes's chronic pain status, as well as her outpatient medication regimen.  The patient  reports that she does not use drugs. Her body mass index is 18.24 kg/m.  Further details on both, my assessment(s), as well as the proposed treatment plan, please see below.  Controlled Substance Pharmacotherapy Assessment REMS (Risk Evaluation and Mitigation Strategy)  Analgesic:Hydrocodone/APAP 5/325 one tablet every 8 hours (15 mg/day) MME/day:15 mg/day Zenovia Jarred, RN  10/24/2016  2:13 PM  Signed Nursing Pain Medication Assessment:  Safety precautions to be maintained throughout the outpatient stay will include: orient to surroundings, keep bed in low position, maintain call bell within reach at all times, provide assistance with transfer out of bed and ambulation.  Medication Inspection Compliance: Pill count conducted under aseptic conditions, in front of the patient. Neither the pills nor the bottle was removed from the patient's sight at any time. Once count was completed pills were immediately returned to the patient in their original bottle.  Medication: Hydrocodone/APAP Pill/Patch Count: 0 of 90 pills remain Pill/Patch Appearance: Markings consistent with prescribed medication Bottle Appearance: Standard pharmacy container. Clearly labeled. Filled Date03 / 01 / 2018 Last Medication intake:  Day before yesterday   Pharmacokinetics: Liberation and absorption (onset of action): WNL Distribution (time to peak effect): WNL Metabolism and excretion (duration of action): WNL           Pharmacodynamics: Desired effects: Analgesia: Ms. Gearing reports >50% benefit. Functional ability: Patient reports that medication allows her to  accomplish basic ADLs Clinically meaningful improvement in function (CMIF): Sustained CMIF goals met Perceived effectiveness: Described as relatively effective, allowing for increase in activities of daily living (ADL) Undesirable effects: Side-effects or Adverse reactions: None reported Monitoring: [place] PMP: Online review of the past 16-monthperiod conducted. Compliant with practice rules and regulations List of all UDS test(s) done:  Lab Results  Component Value Date   TOXASSSELUR FINAL 11/07/2015   TOXASSSELUR FINAL 08/09/2015   [place] 05[place]11[place]2015   Last UDS on record: Tox[employer] Select 13  Date Value Ref Range Status  11/07/2015 FINAL  Final    Comment:    ==================================================================== TOXASSURE SELECT 13 (MW) ==================================================================== Test                             Result       Flag       Units Drug Present and Declared for Prescription Verification   Hydrocodone                    775          EXPECTED   ng/mg creat   Hydromorphone                  333          EXPECTED   ng/mg creat   Dihydrocodeine                 140          EXPECTED   ng/mg creat   Norhydrocodone                 968          EXPECTED   ng/mg creat    Sources of hydrocodone include scheduled prescription    medications. Hydromorphone, dihydrocodeine and norhydrocodone are    expected metabolites of hydrocodone. Hydromorphone and    dihydrocodeine are also available as scheduled prescription    medications. ==================================================================== Test                      Result    Flag   Units      Ref Range   Creatinine              104              mg/dL       >=20 ==================================================================== Declared Medications:  The flagging and interpretation on this report are based on the  following declared medications.  Unexpected results may arise from  inaccuracies in the declared medications.  **Note: The testing scope of this panel includes these medications:  Hydrocodone (Norco)  Hydrocodone (Vicodin)  **Note: The testing scope of this panel does not include following  reported medications:  Acetaminophen (Norco)  Acetaminophen (Vicodin)  Atenolol (Tenormin)  Atorvastatin (Lipitor)  Clopidogrel (Plavix)  Iron (Ferrous Sulfate)  Lubiprostone (Amitiza)  Magnesium (Mag-Ox)  Methocarbamol (Robaxin)  Mirtazapine (Remeron)  Pantoprazole (Protonix)  Supplement ==================================================================== For clinical consultation, please call ([phone] ====================================================================    UDS interpretation: Compliant          Medication Assessment Form: Reviewed. Patient indicates being compliant with therapy Treatment compliance: Compliant Risk Assessment Profile: Aberrant behavior: See prior evaluations. None observed or detected today Comorbid factors increasing risk of overdose: See prior notes. No additional risks detected today Risk of substance use disorder (SUD): Low Opioid Risk Tool (ORT) Total Score: 7  Interpretation Table:  Score <3 = Low Risk for SUD  Score between 4-7 = Moderate Risk for SUD  Score >8 = High Risk for Opioid Abuse   Risk Mitigation Strategies:  Patient Counseling: Covered Patient-Prescriber Agreement (PPA): Present and active  Notification to other healthcare providers: Done  Pharmacologic Plan: No change in therapy, at this time  Laboratory Chemistry  Inflammation Markers Lab Results  Component Value Date   CRP 1.1 (H) 01/08/2016   ESRSEDRATE 3 01/08/2016   (CRP: Acute Phase) (ESR: Chronic  Phase) Renal Function Markers Lab Results  Component Value Date   BUN 8 08/20/2016   CREATININE 0.72 08/20/2016   GFRAA >60 08/20/2016   GFRNONAA >60 08/20/2016   Hepatic Function Markers Lab Results  Component Value Date   AST 17 08/20/2016   ALT 13 (L) 08/20/2016   ALBUMIN 2.6 (L) 08/20/2016   ALKPHOS 55 08/20/2016   Electrolytes Lab Results  Component Value Date   NA 138 08/20/2016   K 3.7 08/20/2016   CL 102 08/20/2016   CALCIUM 7.9 (L) 08/20/2016   MG 2.0 01/08/2016   Neuropathy Markers No results found for: [id number] Bone Pathology Markers Lab Results  Component Value Date   ALKPHOS 55 08/20/2016   VD25OH 5.0 (L) 01/08/2016   25OHVITD1 58 04/15/2016   25OHVITD2 32 04/15/2016   25OHVITD3 26 04/15/2016   CALCIUM 7.9 (L) 08/20/2016   Coagulation Parameters Lab Results  Component Value Date   INR 1.1 02/21/2013   LABPROT 14.2 02/21/2013   APTT 110.0 (H) 11/15/2011   PLT 412 08/20/2016   Cardiovascular Markers Lab Results  Component Value Date   BNP 416.0 (H) 08/19/2016   HGB 12.1 08/20/2016   HCT 34.6 (L) 08/20/2016   Note: Lab results reviewed.  Recent Diagnostic Imaging Review  Dg Chest 2 View  Result Date: 10/04/2016 CLINICAL DATA:  Follow-up pneumonia EXAM: CHEST  2 VIEW COMPARISON:  08/19/2016 FINDINGS: Cardiomediastinal silhouette is stable. Hyperinflation again noted. Improvement in aeration in right middle lobe without residual infiltrate. Minimal residual streaky scarring. No new infiltrate or pulmonary edema. IMPRESSION: Hyperinflation again noted. Improvement in aeration in right middle lobe without residual infiltrate. Minimal residual streaky scarring. No new infiltrate or pulmonary edema. Electronically Signed   By: Lahoma Crocker M.D.   On: 10/04/2016 15:10   Note: Imaging results reviewed.          Meds  The patient has a current medication list which includes the following prescription(s): albuterol, atorvastatin, benzonatate, vitamin d3,  clopidogrel, ferrous sulfate, hydrocodone-acetaminophen, hydrocodone-acetaminophen, hydrocodone-acetaminophen, ipratropium-albuterol, magnesium-oxide, methocarbamol, mirtazapine, pantoprazole, polyethylene glycol powder, and benefiber.  Current Outpatient Prescriptions on File Prior to Visit  Medication Sig  . albuterol (PROVENTIL) (2.5 MG/3ML) 0.083% nebulizer solution Take 3 mLs (2.5 mg total) by nebulization every 4 (four) hours as needed for wheezing or shortness of breath.  Marland Kitchen atorvastatin (LIPITOR) 10 MG tablet TAKE 1 TABLET BY MOUTH EVERY NIGHT AT BEDTIME  . benzonatate (TESSALON) 100 MG capsule Take 1 capsule (100 mg total) by mouth 2 (two) times daily as needed for cough.  . Cholecalciferol (VITAMIN D3) 2000 units capsule Take 1 capsule (2,000 Units total) by mouth daily.  . clopidogrel (PLAVIX) 75 MG tablet Take 1 tablet by mouth daily.  . ferrous sulfate 325 (65 FE) MG tablet TAKE 1 TABLET(325 MG) BY MOUTH DAILY  . Ipratropium-Albuterol (COMBIVENT) 20-100 MCG/ACT AERS respimat Inhale 1 puff into the lungs every 6 (six) hours as needed for wheezing.  . mirtazapine (REMERON) 15  MG tablet TAKE 1 TABLET(15 MG) BY MOUTH AT BEDTIME  . pantoprazole (PROTONIX) 40 MG tablet TAKE 1 TABLET(40 MG) BY MOUTH TWICE DAILY (Patient taking differently: TAKE 1 TABLET(40 MG) BY MOUTH ONCE DAILY)  . polyethylene glycol powder (GLYCOLAX/MIRALAX) powder MIX AND TAKE 17 GRAMS BY MOUTH DAILY.  Marland Kitchen Wheat Dextrin (BENEFIBER) POWD Stir 2 tsp. TID into 4-8 oz of any non-carbonated beverage or soft food (hot or cold)   No current facility-administered medications on file prior to visit.    ROS  Constitutional: Denies any fever or chills Gastrointestinal: No reported hemesis, hematochezia, vomiting, or acute GI distress Musculoskeletal: Denies any acute onset joint swelling, redness, loss of ROM, or weakness Neurological: No reported episodes of acute onset apraxia, aphasia, dysarthria, agnosia, amnesia, paralysis,  loss of coordination, or loss of consciousness  Allergies  Ms. Valls has No Known Allergies.  PFSH  Drug: Ms. Arseneau  reports that she does not use drugs. Alcohol:  reports that she does not drink alcohol. Tobacco:  reports that she has been smoking Cigarettes.  She has a 30.50 pack-year smoking history. She has never used smokeless tobacco. Medical:  has a past medical history of Aneurysm of iliac artery ([place]) (05/11/2015); COPD (chronic obstructive pulmonary disease) ([place]); Cystitis; History of DVT (deep vein thrombosis) (05/11/2015); History of peptic ulcer disease (05/11/2015); [name]; Hypertension; Migraines; [name]; Stroke [facility]); and Vascular disease. Family: family history includes Brain cancer in her father; Kidney disease in her mother.  Past Surgical History:  Procedure Laterality Date  . ABDOMINAL AORTIC ANEURYSM REPAIR W/ ENDOLUMINAL GRAFT  2008  . ABDOMINAL HYSTERECTOMY    . APPENDECTOMY    . BACK SURGERY    . blood clot      removal right leg  . COLONOSCOPY  2012  . ESOPHAGOGASTRODUODENOSCOPY  04/2013   gastric ulcers  . FEMORAL-POPLITEAL BYPASS GRAFT Right 2008  . PARTIAL HYSTERECTOMY     Constitutional Exam  General appearance: Well nourished, well developed, and well hydrated. In no apparent acute distress Vitals:   10/24/16 1358  BP: (!) 169/81  Pulse: 80  Resp: 18  Temp: 98.4 F (36.9 C)  SpO2: 90%  Weight: 113 lb (51.3 kg)  Height: 5' 6" (1.676 m)   BMI Assessment: Estimated body mass index is 18.24 kg/m as calculated from the following:   Height as of this encounter: 5' 6" (1.676 m).   Weight as of this encounter: 113 lb (51.3 kg).  BMI interpretation table: BMI level Category Range association with higher incidence of chronic pain  <18 kg/m2 Underweight   18.5-24.9 kg/m2 Ideal body weight   25-29.9 kg/m2 Overweight Increased incidence by 20%  30-34.9 kg/m2 Obese (Class I) Increased incidence by 68%  35-39.9  kg/m2 Severe obesity (Class II) Increased incidence by 136%  >40 kg/m2 Extreme obesity (Class III) Increased incidence by 254%   BMI Readings from Last 4 Encounters:  10/24/16 18.24 kg/m  10/04/16 18.40 kg/m  09/02/16 18.00 kg/m  08/19/16 18.50 kg/m   Wt Readings from Last 4 Encounters:  10/24/16 113 lb (51.3 kg)  10/04/16 114 lb (51.7 kg)  09/02/16 112 lb (50.8 kg)  08/19/16 111 lb 3.2 oz (50.4 kg)  Psych/Mental status: Alert, oriented x 3 (person, place, & time)       Eyes: PERLA Respiratory: No evidence of acute respiratory distress  Cervical Spine Exam  Inspection: No masses, redness, or swelling Alignment: Symmetrical Functional ROM: Unrestricted ROM Stability: No instability detected Muscle strength & Tone:  Functionally intact Sensory: Unimpaired Palpation: No palpable anomalies  Upper Extremity (UE) Exam    Side: Right upper extremity  Side: Left upper extremity  Inspection: No masses, redness, swelling, or asymmetry. No contractures  Inspection: No masses, redness, swelling, or asymmetry. No contractures  Functional ROM: Unrestricted ROM          Functional ROM: Unrestricted ROM          Muscle strength & Tone: Functionally intact  Muscle strength & Tone: Functionally intact  Sensory: Unimpaired  Sensory: Unimpaired  Palpation: No palpable anomalies  Palpation: No palpable anomalies  Specialized Test(s): Deferred         Specialized Test(s): Deferred          Thoracic Spine Exam  Inspection: No masses, redness, or swelling Alignment: Symmetrical Functional ROM: Unrestricted ROM Stability: No instability detected Sensory: Unimpaired Muscle strength & Tone: No palpable anomalies  Lumbar Spine Exam  Inspection: No masses, redness, or swelling Alignment: Symmetrical Functional ROM: Unrestricted ROM Stability: No instability detected Muscle strength & Tone: Functionally intact Sensory: Unimpaired Palpation: No palpable anomalies Provocative Tests: Lumbar  Hyperextension and rotation test: evaluation deferred today       Patrick's Maneuver: evaluation deferred today              Gait & Posture Assessment  Ambulation: Unassisted Gait: Relatively normal for age and body habitus Posture: WNL   Lower Extremity Exam    Side: Right lower extremity  Side: Left lower extremity  Inspection: No masses, redness, swelling, or asymmetry. No contractures  Inspection: No masses, redness, swelling, or asymmetry. No contractures  Functional ROM: Unrestricted ROM          Functional ROM: Unrestricted ROM          Muscle strength & Tone: Functionally intact  Muscle strength & Tone: Functionally intact  Sensory: Unimpaired  Sensory: Unimpaired  Palpation: No palpable anomalies  Palpation: No palpable anomalies   Assessment  Primary Diagnosis & Pertinent Problem List: The primary encounter diagnosis was Chronic lower extremity pain (Location of Primary Source of Pain) (Right). Diagnoses of Chronic low back pain (Location of Secondary source of pain) (Bilateral) (R>L), Chronic pain syndrome, Facet syndrome, lumbar (HCC), Lower extremity pain (Right), Musculoskeletal pain, Chronic radicular lumbar pain (Right), Lumbar foraminal stenosis (Right) (Severe at L4-5), and Gastroesophageal reflux disease without esophagitis were also pertinent to this visit.  Status Diagnosis  Controlled Controlled Controlled 1. Chronic lower extremity pain (Location of Primary Source of Pain) (Right)   2. Chronic low back pain (Location of Secondary source of pain) (Bilateral) (R>L)   3. Chronic pain syndrome   4. Facet syndrome, lumbar ([place])   5. Lower extremity pain (Right)   6. Musculoskeletal pain   7. Chronic radicular lumbar pain (Right)   8. Lumbar foraminal stenosis (Right) (Severe at L4-5)   9. Gastroesophageal reflux disease without esophagitis      Plan of Care  Pharmacotherapy (Medications Ordered): Meds ordered this encounter  Medications  .  HYDROcodone-acetaminophen (NORCO/VICODIN) 5-325 MG tablet    Sig: Take 1 tablet by mouth every 8 (eight) hours as needed for severe pain.    Dispense:  90 tablet    Refill:  0    Do not place this medication, or any other prescription from our practice, on "Automatic Refill". Patient may have prescription filled one day early if pharmacy is closed on scheduled refill date. Do not fill until: 11/01/16 To last until: 12/01/16  . HYDROcodone-acetaminophen (NORCO/VICODIN) 5-325 MG  tablet    Sig: Take 1 tablet by mouth every 8 (eight) hours as needed for severe pain.    Dispense:  90 tablet    Refill:  0    Do not place this medication, or any other prescription from our practice, on "Automatic Refill". Patient may have prescription filled one day early if pharmacy is closed on scheduled refill date. Do not fill until: 12/01/16 To last until: 12/31/16  . HYDROcodone-acetaminophen (NORCO/VICODIN) 5-325 MG tablet    Sig: Take 1 tablet by mouth every 8 (eight) hours as needed for severe pain.    Dispense:  90 tablet    Refill:  0    Do not place this medication, or any other prescription from our practice, on "Automatic Refill". Patient may have prescription filled one day early if pharmacy is closed on scheduled refill date. Do not fill until: 12/31/16 To last until: 01/30/17  . MAGNESIUM-OXIDE 400 (241.3 Mg) MG tablet    Sig: Take 1 tablet (400 mg total) by mouth daily. Reported on 11/07/2015    Dispense:  90 tablet    Refill:  0    Do not add this medication to the electronic "Automatic Refill" notification system. Patient may have prescription filled one day early if pharmacy is closed on scheduled refill date.  . methocarbamol (ROBAXIN) 750 MG tablet    Sig: Take 1 tablet (750 mg total) by mouth 3 (three) times daily as needed for muscle spasms.    Dispense:  90 tablet    Refill:  2    Do not add this medication to the electronic "Automatic Refill" notification system. Patient may have  prescription filled one day early if pharmacy is closed on scheduled refill date.   New Prescriptions   No medications on file   Medications administered today: Ms. Gashi had no medications administered during this visit. Lab-work, procedure(s), and/or referral(s): No orders of the defined types were placed in this encounter.  Imaging and/or referral(s): None  Interventional therapies: Planned, scheduled, and/or pending:   Not at this time.   Considering:   Diagnostic right L4-5 transforaminal epidural steroid injection  Diagnostic right L4-5 lumbar epidural steroid injection  Diagnostic bilateral lumbar facet block  Possible bilateral lumbar facet radiofrequency ablation    Palliative PRN treatment(s):   Diagnostic right L4-5 transforaminal epidural steroid injection Diagnostic right L4-5 lumbar epidural steroid injection  Diagnostic bilateral lumbar facet block    Provider-requested follow-up: Return in about 3 months (around 01/23/2017) for (Nurse Practitioner) Med-Mgmt, in addition, (PRN) procedure.  Future Appointments Date Time Provider [place]  12/13/2016 9:30 AM [facility] None  12/13/2016 10:00 AM [facility] None  12/13/2016 10:45 AM Algernon Huxley, MD [facility] None  01/22/2017 1:00 PM [place], NP [facility] None  03/19/2017 9:30 AM Glean Hess, MD [facility] None   Primary Care Physician: Glean Hess, MD Location: [facility] Outpatient [facility] Management Facility Note by: Kathlen Brunswick. Dossie Arbour, M.D, DABA, DABAPM, DABPM, DABIPP, FIPP Date: 10/24/2016; Time: 2:33 PM  Pain Score Disclaimer: We use the NRS-11 scale. This is a self-reported, subjective measurement of pain severity with only modest accuracy. It is used primarily to identify changes within a particular patient. It must be understood that outpatient pain scales are significantly less accurate that those used for research, where they can be applied under ideal controlled  circumstances with minimal exposure to variables. In reality, the score is likely to be a combination of pain intensity  and pain affect, where pain affect describes the degree of emotional arousal or changes in action readiness caused by the sensory experience of pain. Factors such as social and work situation, setting, emotional state, anxiety levels, expectation, and prior pain experience may influence pain perception and show large inter-individual differences that may also be affected by time variables.  Patient instructions provided during this appointment: Patient Instructions  Steps to Quit Smoking Smoking tobacco can be bad for your health. It can also affect almost every organ in your body. Smoking puts you and people around you at risk for many serious long-lasting (chronic) diseases. Quitting smoking is hard, but it is one of the best things that you can do for your health. It is never too late to quit. What are the benefits of quitting smoking? When you quit smoking, you lower your risk for getting serious diseases and conditions. They can include:  Lung cancer or lung disease.  Heart disease.  Stroke.  Heart attack.  Not being able to have children (infertility).  Weak bones (osteoporosis) and broken bones (fractures). If you have coughing, wheezing, and shortness of breath, those symptoms may get better when you quit. You may also get sick less often. If you are pregnant, quitting smoking can help to lower your chances of having a baby of low birth weight. What can I do to help me quit smoking? Talk with your doctor about what can help you quit smoking. Some things you can do (strategies) include:  Quitting smoking totally, instead of slowly cutting back how much you smoke over a period of time.  Going to in-person counseling. You are more likely to quit if you go to many counseling sessions.  Using resources and support systems, such as:  Online chats with a  Social worker.  Phone quitlines.  Printed Furniture conservator/restorer.  Support groups or group counseling.  Text messaging programs.  Mobile phone apps or applications.  Taking medicines. Some of these medicines may have nicotine in them. If you are pregnant or breastfeeding, do not take any medicines to quit smoking unless your doctor says it is okay. Talk with your doctor about counseling or other things that can help you. Talk with your doctor about using more than one strategy at the same time, such as taking medicines while you are also going to in-person counseling. This can help make quitting easier. What things can I do to make it easier to quit? Quitting smoking might feel very hard at first, but there is a lot that you can do to make it easier. Take these steps:  Talk to your family and friends. Ask them to support and encourage you.  Call phone quitlines, reach out to support groups, or work with a Social worker.  Ask people who smoke to not smoke around you.  Avoid places that make you want (trigger) to smoke, such as:  Bars.  Parties.  Smoke-break areas at work.  Spend time with people who do not smoke.  Lower the stress in your life. Stress can make you want to smoke. Try these things to help your stress:  Getting regular exercise.  Deep-breathing exercises.  Yoga.  Meditating.  Doing a body scan. To do this, close your eyes, focus on one area of your body at a time from head to toe, and notice which parts of your body are tense. Try to relax the muscles in those areas.  Download or buy apps on your mobile phone or tablet that can help you  stick to your quit plan. There are many free apps, such as QuitGuide from the [employer] manager for Disease Control and Prevention). You can find more support from smokefree.gov and other websites. This information is not intended to replace advice given to you by your health care provider. Make sure you discuss any questions you have with  your health care provider. Document Released: 04/27/2009 Document Revised: 02/27/2016 Document Reviewed: 11/15/2014 Elsevier Interactive Patient Education  2017 [employer]. Epidural Steroid Injection Patient Information  Description: The epidural space surrounds the nerves as they exit the spinal cord.  In some patients, the nerves can be compressed and inflamed by a bulging disc or a tight spinal canal (spinal stenosis).  By injecting steroids into the epidural space, we can bring irritated nerves into direct contact with a potentially helpful medication.  These steroids act directly on the irritated nerves and can reduce swelling and inflammation which often leads to decreased pain.  Epidural steroids may be injected anywhere along the spine and from the neck to the low back depending upon the location of your pain.   After numbing the skin with local anesthetic (like Novocaine), a small needle is passed into the epidural space slowly.  You may experience a sensation of pressure while this is being done.  The entire block usually last less than 10 minutes.  Conditions which may be treated by epidural steroids:  Low back and leg pain Neck and arm pain Spinal stenosis Post-laminectomy syndrome Herpes zoster (shingles) pain Pain from compression fractures  Preparation for the injection:  Do not eat any solid food or dairy products within 8 hours of your appointment.  You may drink clear liquids up to 3 hours before appointment.  Clear liquids include water, black coffee, juice or soda.  No milk or cream please. You may take your regular medication, including pain medications, with a sip of water before your appointment  Diabetics should hold regular insulin (if taken separately) and take 1/2 normal NPH dos the morning of the procedure.  Carry some sugar containing items with you to your appointment. A driver must accompany you and be prepared to drive you home after your procedure.  Bring  all your current medications with your. An IV may be inserted and sedation may be given at the discretion of the physician.   A blood pressure cuff, EKG and other monitors will often be applied during the procedure.  Some patients may need to have extra oxygen administered for a short period. You will be asked to provide medical information, including your allergies, prior to the procedure.  We must know immediately if you are taking blood thinners (like Coumadin/Warfarin)  Or if you are allergic to IV iodine contrast (dye). We must know if you could possible be pregnant.  Possible side-effects: Bleeding from needle site Infection (rare, may require surgery) Nerve injury (rare) Numbness & tingling (temporary) Difficulty urinating (rare, temporary) Spinal headache ( a headache worse with upright posture) Light -headedness (temporary) Pain at injection site (several days) Decreased blood pressure (temporary) Weakness in arm/leg (temporary) Pressure sensation in back/neck (temporary)  Call if you experience: Fever/chills associated with headache or increased back/neck pain. Headache worsened by an upright position. New onset weakness or numbness of an extremity below the injection site Hives or difficulty breathing (go to the emergency room) Inflammation or drainage at the infection site Severe back/neck pain Any new symptoms which are concerning to you  Please note:  Although the local anesthetic injected can  often make your back or neck feel good for several hours after the injection, the pain will likely return.  It takes 3-7 days for steroids to work in the epidural space.  You may not notice any pain relief for at least that one week.  If effective, we will often do a series of three injections spaced 3-6 weeks apart to maximally decrease your pain.  After the initial series, we generally will wait several months before considering a repeat injection of the same type.  If you have  any questions, please call [phone] [facility]  We will sent a fax to Dr. Lucky Cowboy asking for clearance to stop Plavix 7 days or bridge with Lovenox. We will call you after we get a response. You were given 3 prescriptions for Norco today.  Do not eat or drink within 3 hours of your procedure appointment. Take your morning BP meds.

## 2016-10-25 DIAGNOSIS — E43 Unspecified severe protein-calorie malnutrition: Secondary | ICD-10-CM | POA: Diagnosis not present

## 2016-10-25 DIAGNOSIS — F329 Major depressive disorder, single episode, unspecified: Secondary | ICD-10-CM | POA: Diagnosis not present

## 2016-10-25 DIAGNOSIS — D509 Iron deficiency anemia, unspecified: Secondary | ICD-10-CM | POA: Diagnosis not present

## 2016-10-25 DIAGNOSIS — I739 Peripheral vascular disease, unspecified: Secondary | ICD-10-CM | POA: Diagnosis not present

## 2016-10-25 DIAGNOSIS — I10 Essential (primary) hypertension: Secondary | ICD-10-CM | POA: Diagnosis not present

## 2016-10-25 DIAGNOSIS — J438 Other emphysema: Secondary | ICD-10-CM | POA: Diagnosis not present

## 2016-10-26 DIAGNOSIS — F329 Major depressive disorder, single episode, unspecified: Secondary | ICD-10-CM | POA: Diagnosis not present

## 2016-10-26 DIAGNOSIS — I739 Peripheral vascular disease, unspecified: Secondary | ICD-10-CM | POA: Diagnosis not present

## 2016-10-26 DIAGNOSIS — J438 Other emphysema: Secondary | ICD-10-CM | POA: Diagnosis not present

## 2016-10-26 DIAGNOSIS — D509 Iron deficiency anemia, unspecified: Secondary | ICD-10-CM | POA: Diagnosis not present

## 2016-10-26 DIAGNOSIS — E43 Unspecified severe protein-calorie malnutrition: Secondary | ICD-10-CM | POA: Diagnosis not present

## 2016-10-26 DIAGNOSIS — I10 Essential (primary) hypertension: Secondary | ICD-10-CM | POA: Diagnosis not present

## 2016-10-28 ENCOUNTER — Ambulatory Visit (INDEPENDENT_AMBULATORY_CARE_PROVIDER_SITE_OTHER): Payer: Medicare Other | Admitting: Internal Medicine

## 2016-10-28 ENCOUNTER — Encounter: Payer: Self-pay | Admitting: Internal Medicine

## 2016-10-28 VITALS — BP 146/82 | HR 76 | Ht 66.0 in | Wt 113.0 lb

## 2016-10-28 DIAGNOSIS — I10 Essential (primary) hypertension: Secondary | ICD-10-CM | POA: Diagnosis not present

## 2016-10-28 DIAGNOSIS — E43 Unspecified severe protein-calorie malnutrition: Secondary | ICD-10-CM

## 2016-10-28 DIAGNOSIS — I999 Unspecified disorder of circulatory system: Secondary | ICD-10-CM

## 2016-10-28 MED ORDER — ATENOLOL 25 MG PO TABS: 25.0000 mg | ORAL_TABLET | Freq: Two times a day (BID) | ORAL | 5 refills | Status: DC

## 2016-10-28 NOTE — Progress Notes (Signed)
Date:  10/28/2016   Name:  Tasha Reyes   DOB:  09-14-1930   MRN:  [id number]   Chief Complaint: Edema (3-4 days ago this started. Don't know why. Arms and feet are both swelling- cannot wear shoes and watch band is now tight.) and Hypertension Hypertension  This is a chronic problem. The problem has been waxing and waning since onset. The problem is uncontrolled. Associated symptoms include peripheral edema and shortness of breath. Pertinent negatives include no chest pain, headaches, orthopnea or palpitations. There are no associated agents to hypertension. Risk factors for coronary artery disease include smoking/tobacco exposure and sedentary lifestyle. Past treatments include beta blockers. The current treatment provides mild improvement. Hypertensive end-organ damage includes PVD.   Edema - noted onset about 4 days ago.  In left arm and both feet.  She is not elevating her legs.  She is eating very little (has not eating anything today) and when she does it just a sandwich.  She does not like Ensure or Boost.  She has hx of DVT but continues on Plavik for PVD. Albumin was very low during hospital stay.  CBC was normal.   Review of Systems  Constitutional: Positive for appetite change. Negative for chills and fatigue.  Eyes: Negative for visual disturbance.  Respiratory: Positive for shortness of breath. Negative for chest tightness and wheezing.   Cardiovascular: Positive for leg swelling. Negative for chest pain, palpitations and orthopnea.  Gastrointestinal: Negative for abdominal pain.  Skin: Negative for rash and wound.  Neurological: Negative for headaches.  Psychiatric/Behavioral: Negative for sleep disturbance.    Patient Active Problem List   Diagnosis Date Noted  . Dependence on supplemental oxygen 09/04/2016  . Protein-calorie malnutrition, severe 08/20/2016  . Pneumonia of right middle lobe due to infectious organism ([place]) 08/19/2016  . Nausea and vomiting  07/30/2016  . Vitamin D deficiency 01/10/2016  . Opioid-induced constipation (OIC) 11/07/2015  . Osteoporosis, post-menopausal 11/07/2015  . GERD (gastroesophageal reflux disease) 11/07/2015  . Lumbar foraminal stenosis (Right) (Severe at L4-5) 06/20/2015  . Chronic low back pain (Location of Secondary source of pain) (Bilateral) (R>L) 05/11/2015  . Chronic lower extremity pain (Location of Primary Source of Pain) (Right) 05/11/2015  . Lumbosacral Radiculopathy (Right) 05/11/2015  . Chronic radicular lumbar pain (Right) 05/11/2015  . Long term current use of opiate analgesic 05/11/2015  . Long term prescription opiate use 05/11/2015  . Opiate use 05/11/2015  . Peripheral vascular disease ([place]) 05/11/2015  . Lumbar spondylosis 05/11/2015  . Hx of long term use of blood thinners (Plavix) 05/11/2015  . Lower extremity pain (Right) 05/11/2015  . Chronic pain syndrome 05/11/2015  . Neurogenic pain 05/11/2015  . History of DVT (deep vein thrombosis) 05/11/2015  . Lumbar facet hypertrophy 05/11/2015  . Facet syndrome, lumbar ([place]) 05/11/2015  . Dyslipidemia 12/23/2014  . Compulsive tobacco user syndrome 12/23/2014  . Chronic peptic ulcer with bleeding 12/23/2014  . Essential (primary) hypertension 12/23/2014  . Depression, major, recurrent, in partial remission ([place]) 12/23/2014  . Alveolar emphysema of lung ([place]) 12/23/2014  . Vascular disorder of lower extremity 12/23/2014  . Restless leg 12/23/2014  . Chronic tension-type headache, intractable 12/23/2014    Prior to Admission medications   Medication Sig Start Date End Date Taking? Authorizing Provider  albuterol (PROVENTIL) (2.5 MG/3ML) 0.083% nebulizer solution Take 3 mLs (2.5 mg total) by nebulization every 4 (four) hours as needed for wheezing or shortness of breath. 08/21/16  Yes Fritzi Mandes, MD  atenolol (TENORMIN)  25 MG tablet Take 1 tablet by mouth daily. 10/25/16  Yes Historical Provider, MD  atorvastatin (LIPITOR) 10 MG tablet  TAKE 1 TABLET BY MOUTH EVERY NIGHT AT BEDTIME 08/26/16  Yes Glean Hess, MD  benzonatate (TESSALON) 100 MG capsule Take 1 capsule (100 mg total) by mouth 2 (two) times daily as needed for cough. 09/02/16  Yes Glean Hess, MD  Cholecalciferol (VITAMIN D3) 2000 units capsule Take 1 capsule (2,000 Units total) by mouth daily. 08/03/16  Yes Milinda Pointer, MD  clopidogrel (PLAVIX) 75 MG tablet Take 1 tablet by mouth daily.   Yes Historical Provider, MD  ferrous sulfate 325 (65 FE) MG tablet TAKE 1 TABLET(325 MG) BY MOUTH DAILY 06/05/16  Yes Glean Hess, MD  HYDROcodone-acetaminophen (NORCO/VICODIN) 5-325 MG tablet Take 1 tablet by mouth every 8 (eight) hours as needed for severe pain. 11/01/16 12/01/16 Yes Milinda Pointer, MD  HYDROcodone-acetaminophen (NORCO/VICODIN) 5-325 MG tablet Take 1 tablet by mouth every 8 (eight) hours as needed for severe pain. 12/31/16 01/30/17 Yes Milinda Pointer, MD  Ipratropium-Albuterol (COMBIVENT) 20-100 MCG/ACT AERS respimat Inhale 1 puff into the lungs every 6 (six) hours as needed for wheezing. 08/21/16  Yes Fritzi Mandes, MD  MAGNESIUM-OXIDE 400 (241.3 Mg) MG tablet Take 1 tablet (400 mg total) by mouth daily. Reported on 11/07/2015 11/01/16 01/30/17 Yes Milinda Pointer, MD  methocarbamol (ROBAXIN) 750 MG tablet Take 1 tablet (750 mg total) by mouth 3 (three) times daily as needed for muscle spasms. 11/01/16 01/30/17 Yes Milinda Pointer, MD  mirtazapine (REMERON) 15 MG tablet TAKE 1 TABLET(15 MG) BY MOUTH AT BEDTIME 09/02/16  Yes Glean Hess, MD  pantoprazole (PROTONIX) 40 MG tablet TAKE 1 TABLET(40 MG) BY MOUTH TWICE DAILY Patient taking differently: TAKE 1 TABLET(40 MG) BY MOUTH ONCE DAILY 05/08/16  Yes Glean Hess, MD  polyethylene glycol powder (GLYCOLAX/MIRALAX) powder MIX AND TAKE 17 GRAMS BY MOUTH DAILY. 01/30/16  Yes Historical Provider, MD  Wheat Dextrin (BENEFIBER) POWD Stir 2 tsp. TID into 4-8 oz of any non-carbonated beverage or soft food  (hot or cold) 08/03/16  Yes Milinda Pointer, MD    No Known Allergies  Past Surgical History:  Procedure Laterality Date  . ABDOMINAL AORTIC ANEURYSM REPAIR W/ ENDOLUMINAL GRAFT  2008  . ABDOMINAL HYSTERECTOMY    . APPENDECTOMY    . BACK SURGERY    . blood clot      removal right leg  . COLONOSCOPY  2012  . ESOPHAGOGASTRODUODENOSCOPY  04/2013   gastric ulcers  . FEMORAL-POPLITEAL BYPASS GRAFT Right 2008  . PARTIAL HYSTERECTOMY      Social History  Substance Use Topics  . Smoking status: Current Every Day Smoker    Packs/day: 0.50    Years: 61.00    Types: Cigarettes  . Smokeless tobacco: Never Used  . Alcohol use No     Medication list has been reviewed and updated.   Physical Exam  Constitutional: She is oriented to person, place, and time. She appears well-developed. No distress.  HENT:  Head: Normocephalic and atraumatic.  Cardiovascular: Normal rate, regular rhythm and normal heart sounds.   Non pitting edema both feet - calfs soft with no redness or cord Non pitting edema left forearm - no redness or cord of upper arm  Pulmonary/Chest: Effort normal. No respiratory distress. She has decreased breath sounds. She has no wheezes. She has no rhonchi. She has no rales.  Abdominal: Soft. Bowel sounds are normal. There is no tenderness.  Musculoskeletal:  She exhibits edema.  Lymphadenopathy:    She has no cervical adenopathy.    She has no axillary adenopathy.  Neurological: She is alert and oriented to person, place, and time.  Skin: Skin is warm and dry. No rash noted.  Non pitting edema both feet Non pitting edema left forearm  Psychiatric: She has a normal mood and affect. Her behavior is normal. Thought content normal.  Nursing note and vitals reviewed.   BP (!) 146/82 (BP Location: Right Leg)   Pulse 76   Ht '5\' 6"'$  (1.676 m)   Wt 113 lb (51.3 kg)   SpO2 (!) 89%   BMI 18.24 kg/m   Assessment and Plan: 1. Edema due to malnutrition, due to unspecified  malnutrition type ([place]) Elevate toes above the nose whenever possible Begin higher protein - shakes/boost/ensure/carnation instant breakfast  - Comprehensive metabolic panel - CBC with Differential/Platelet  2. Essential (primary) hypertension Increase dose of atenolol - atenolol (TENORMIN) 25 MG tablet; Take 1 tablet (25 mg total) by mouth 2 (two) times daily.  Dispense: 60 tablet; Refill: 5  3. Vascular disorder of lower extremity No evidence of cellulitis   Meds ordered this encounter  Medications  . atenolol (TENORMIN) 25 MG tablet    Sig: Take 1 tablet (25 mg total) by mouth 2 (two) times daily.    Dispense:  60 tablet    Refill:  [place], MD [facility]  10/28/2016

## 2016-10-28 NOTE — Patient Instructions (Addendum)
Begin Carnation instant breakfast with whole milk - at least once per day.  "Toes above the nose"

## 2016-10-29 DIAGNOSIS — I739 Peripheral vascular disease, unspecified: Secondary | ICD-10-CM | POA: Diagnosis not present

## 2016-10-29 DIAGNOSIS — F329 Major depressive disorder, single episode, unspecified: Secondary | ICD-10-CM | POA: Diagnosis not present

## 2016-10-29 DIAGNOSIS — I10 Essential (primary) hypertension: Secondary | ICD-10-CM | POA: Diagnosis not present

## 2016-10-29 DIAGNOSIS — D509 Iron deficiency anemia, unspecified: Secondary | ICD-10-CM | POA: Diagnosis not present

## 2016-10-29 DIAGNOSIS — E43 Unspecified severe protein-calorie malnutrition: Secondary | ICD-10-CM | POA: Diagnosis not present

## 2016-10-29 DIAGNOSIS — J438 Other emphysema: Secondary | ICD-10-CM | POA: Diagnosis not present

## 2016-10-29 LAB — CBC WITH DIFFERENTIAL/PLATELET
BASOS ABS: 0 10*3/uL (ref 0.0–0.2)
Basos: 0 %
EOS (ABSOLUTE): 0.1 10*3/uL (ref 0.0–0.4)
Eos: 2 %
Hematocrit: 43.6 % (ref 34.0–46.6)
Hemoglobin: 14.5 g/dL (ref 11.1–15.9)
Immature Grans (Abs): 0 10*3/uL (ref 0.0–0.1)
Immature Granulocytes: 0 %
LYMPHS ABS: 1.9 10*3/uL (ref 0.7–3.1)
Lymphs: 23 %
MCH: 30.1 pg (ref 26.6–33.0)
MCHC: 33.3 g/dL (ref 31.5–35.7)
MCV: 91 fL (ref 79–97)
MONOS ABS: 0.5 10*3/uL (ref 0.1–0.9)
Monocytes: 6 %
Neutrophils Absolute: 5.6 10*3/uL (ref 1.4–7.0)
Neutrophils: 69 %
PLATELETS: 414 10*3/uL — AB (ref 150–379)
RBC: 4.82 x10E6/uL (ref 3.77–5.28)
RDW: 15.1 % (ref 12.3–15.4)
WBC: 8.2 10*3/uL (ref 3.4–10.8)

## 2016-10-29 LAB — COMPREHENSIVE METABOLIC PANEL
A/G RATIO: 1.3 (ref 1.2–2.2)
ALBUMIN: 4.3 g/dL (ref 3.5–4.7)
ALK PHOS: 97 IU/L (ref 39–117)
ALT: 8 IU/L (ref 0–32)
AST: 17 IU/L (ref 0–40)
BILIRUBIN TOTAL: 0.3 mg/dL (ref 0.0–1.2)
BUN / CREAT RATIO: 9 — AB (ref 12–28)
BUN: 8 mg/dL (ref 8–27)
CHLORIDE: 101 mmol/L (ref 96–106)
CO2: 26 mmol/L (ref 18–29)
Calcium: 9.3 mg/dL (ref 8.7–10.3)
Creatinine, Ser: 0.93 mg/dL (ref 0.57–1.00)
GFR calc Af Amer: 65 mL/min/{1.73_m2} (ref >59)
GFR calc non Af Amer: 57 mL/min/{1.73_m2} — ABNORMAL LOW (ref >59)
GLOBULIN, TOTAL: 3.3 g/dL (ref 1.5–4.5)
GLUCOSE: 102 mg/dL — AB (ref 65–99)
POTASSIUM: 4.8 mmol/L (ref 3.5–5.2)
SODIUM: 145 mmol/L — AB (ref 134–144)
Total Protein: 7.6 g/dL (ref 6.0–8.5)

## 2016-10-30 ENCOUNTER — Encounter (INDEPENDENT_AMBULATORY_CARE_PROVIDER_SITE_OTHER): Payer: Medicare Other | Admitting: Internal Medicine

## 2016-10-30 DIAGNOSIS — K219 Gastro-esophageal reflux disease without esophagitis: Secondary | ICD-10-CM

## 2016-10-30 DIAGNOSIS — F17200 Nicotine dependence, unspecified, uncomplicated: Secondary | ICD-10-CM

## 2016-10-30 DIAGNOSIS — Z9981 Dependence on supplemental oxygen: Secondary | ICD-10-CM

## 2016-10-30 DIAGNOSIS — F3341 Major depressive disorder, recurrent, in partial remission: Secondary | ICD-10-CM

## 2016-10-30 DIAGNOSIS — I739 Peripheral vascular disease, unspecified: Secondary | ICD-10-CM

## 2016-10-30 DIAGNOSIS — G894 Chronic pain syndrome: Secondary | ICD-10-CM

## 2016-10-30 DIAGNOSIS — E43 Unspecified severe protein-calorie malnutrition: Secondary | ICD-10-CM

## 2016-10-30 DIAGNOSIS — J431 Panlobular emphysema: Secondary | ICD-10-CM

## 2016-10-30 DIAGNOSIS — I10 Essential (primary) hypertension: Secondary | ICD-10-CM | POA: Diagnosis not present

## 2016-10-30 DIAGNOSIS — E785 Hyperlipidemia, unspecified: Secondary | ICD-10-CM

## 2016-10-30 DIAGNOSIS — M810 Age-related osteoporosis without current pathological fracture: Secondary | ICD-10-CM

## 2016-10-30 DIAGNOSIS — M792 Neuralgia and neuritis, unspecified: Secondary | ICD-10-CM

## 2016-10-30 NOTE — Progress Notes (Signed)
Received home health orders from [facility] in [place].  Start of care 08/22/16.  Certification period 10/21/16 through 12/19/16.   Orders are reviewed, signed and faxed.

## 2016-11-01 DIAGNOSIS — J438 Other emphysema: Secondary | ICD-10-CM | POA: Diagnosis not present

## 2016-11-01 DIAGNOSIS — F329 Major depressive disorder, single episode, unspecified: Secondary | ICD-10-CM | POA: Diagnosis not present

## 2016-11-01 DIAGNOSIS — D509 Iron deficiency anemia, unspecified: Secondary | ICD-10-CM | POA: Diagnosis not present

## 2016-11-01 DIAGNOSIS — E43 Unspecified severe protein-calorie malnutrition: Secondary | ICD-10-CM | POA: Diagnosis not present

## 2016-11-01 DIAGNOSIS — I10 Essential (primary) hypertension: Secondary | ICD-10-CM | POA: Diagnosis not present

## 2016-11-01 DIAGNOSIS — I739 Peripheral vascular disease, unspecified: Secondary | ICD-10-CM | POA: Diagnosis not present

## 2016-11-05 DIAGNOSIS — I739 Peripheral vascular disease, unspecified: Secondary | ICD-10-CM | POA: Diagnosis not present

## 2016-11-05 DIAGNOSIS — E43 Unspecified severe protein-calorie malnutrition: Secondary | ICD-10-CM | POA: Diagnosis not present

## 2016-11-05 DIAGNOSIS — I10 Essential (primary) hypertension: Secondary | ICD-10-CM | POA: Diagnosis not present

## 2016-11-05 DIAGNOSIS — F329 Major depressive disorder, single episode, unspecified: Secondary | ICD-10-CM | POA: Diagnosis not present

## 2016-11-05 DIAGNOSIS — D509 Iron deficiency anemia, unspecified: Secondary | ICD-10-CM | POA: Diagnosis not present

## 2016-11-05 DIAGNOSIS — J438 Other emphysema: Secondary | ICD-10-CM | POA: Diagnosis not present

## 2016-11-06 ENCOUNTER — Other Ambulatory Visit: Payer: Self-pay | Admitting: Internal Medicine

## 2016-11-08 DIAGNOSIS — I739 Peripheral vascular disease, unspecified: Secondary | ICD-10-CM | POA: Diagnosis not present

## 2016-11-08 DIAGNOSIS — F329 Major depressive disorder, single episode, unspecified: Secondary | ICD-10-CM | POA: Diagnosis not present

## 2016-11-08 DIAGNOSIS — J438 Other emphysema: Secondary | ICD-10-CM | POA: Diagnosis not present

## 2016-11-08 DIAGNOSIS — D509 Iron deficiency anemia, unspecified: Secondary | ICD-10-CM | POA: Diagnosis not present

## 2016-11-08 DIAGNOSIS — E43 Unspecified severe protein-calorie malnutrition: Secondary | ICD-10-CM | POA: Diagnosis not present

## 2016-11-08 DIAGNOSIS — I10 Essential (primary) hypertension: Secondary | ICD-10-CM | POA: Diagnosis not present

## 2016-11-12 DIAGNOSIS — I739 Peripheral vascular disease, unspecified: Secondary | ICD-10-CM | POA: Diagnosis not present

## 2016-11-12 DIAGNOSIS — I10 Essential (primary) hypertension: Secondary | ICD-10-CM | POA: Diagnosis not present

## 2016-11-12 DIAGNOSIS — J438 Other emphysema: Secondary | ICD-10-CM | POA: Diagnosis not present

## 2016-11-12 DIAGNOSIS — E43 Unspecified severe protein-calorie malnutrition: Secondary | ICD-10-CM | POA: Diagnosis not present

## 2016-11-12 DIAGNOSIS — F329 Major depressive disorder, single episode, unspecified: Secondary | ICD-10-CM | POA: Diagnosis not present

## 2016-11-12 DIAGNOSIS — D509 Iron deficiency anemia, unspecified: Secondary | ICD-10-CM | POA: Diagnosis not present

## 2016-11-23 DIAGNOSIS — F329 Major depressive disorder, single episode, unspecified: Secondary | ICD-10-CM | POA: Diagnosis not present

## 2016-11-23 DIAGNOSIS — J438 Other emphysema: Secondary | ICD-10-CM | POA: Diagnosis not present

## 2016-11-23 DIAGNOSIS — I739 Peripheral vascular disease, unspecified: Secondary | ICD-10-CM | POA: Diagnosis not present

## 2016-11-23 DIAGNOSIS — I10 Essential (primary) hypertension: Secondary | ICD-10-CM | POA: Diagnosis not present

## 2016-11-23 DIAGNOSIS — E43 Unspecified severe protein-calorie malnutrition: Secondary | ICD-10-CM | POA: Diagnosis not present

## 2016-11-23 DIAGNOSIS — D509 Iron deficiency anemia, unspecified: Secondary | ICD-10-CM | POA: Diagnosis not present

## 2016-11-29 DIAGNOSIS — J438 Other emphysema: Secondary | ICD-10-CM | POA: Diagnosis not present

## 2016-11-29 DIAGNOSIS — D509 Iron deficiency anemia, unspecified: Secondary | ICD-10-CM | POA: Diagnosis not present

## 2016-11-29 DIAGNOSIS — E43 Unspecified severe protein-calorie malnutrition: Secondary | ICD-10-CM | POA: Diagnosis not present

## 2016-11-29 DIAGNOSIS — I739 Peripheral vascular disease, unspecified: Secondary | ICD-10-CM | POA: Diagnosis not present

## 2016-11-29 DIAGNOSIS — I10 Essential (primary) hypertension: Secondary | ICD-10-CM | POA: Diagnosis not present

## 2016-11-29 DIAGNOSIS — F329 Major depressive disorder, single episode, unspecified: Secondary | ICD-10-CM | POA: Diagnosis not present

## 2016-12-04 ENCOUNTER — Other Ambulatory Visit: Payer: Self-pay | Admitting: Internal Medicine

## 2016-12-06 DIAGNOSIS — I10 Essential (primary) hypertension: Secondary | ICD-10-CM | POA: Diagnosis not present

## 2016-12-06 DIAGNOSIS — I739 Peripheral vascular disease, unspecified: Secondary | ICD-10-CM | POA: Diagnosis not present

## 2016-12-06 DIAGNOSIS — D509 Iron deficiency anemia, unspecified: Secondary | ICD-10-CM | POA: Diagnosis not present

## 2016-12-06 DIAGNOSIS — F329 Major depressive disorder, single episode, unspecified: Secondary | ICD-10-CM | POA: Diagnosis not present

## 2016-12-06 DIAGNOSIS — E43 Unspecified severe protein-calorie malnutrition: Secondary | ICD-10-CM | POA: Diagnosis not present

## 2016-12-06 DIAGNOSIS — J438 Other emphysema: Secondary | ICD-10-CM | POA: Diagnosis not present

## 2016-12-12 ENCOUNTER — Telehealth: Payer: Self-pay

## 2016-12-12 NOTE — Telephone Encounter (Signed)
Patient notified that she has permission to stop Plavix for 7 days without Lovenox bridge.  Patient states she has not scheduled a procedure yet.

## 2016-12-13 ENCOUNTER — Encounter (INDEPENDENT_AMBULATORY_CARE_PROVIDER_SITE_OTHER): Payer: Self-pay

## 2016-12-13 ENCOUNTER — Ambulatory Visit (INDEPENDENT_AMBULATORY_CARE_PROVIDER_SITE_OTHER): Payer: Self-pay | Admitting: Vascular Surgery

## 2016-12-13 ENCOUNTER — Other Ambulatory Visit (INDEPENDENT_AMBULATORY_CARE_PROVIDER_SITE_OTHER): Payer: Self-pay

## 2016-12-14 DIAGNOSIS — D509 Iron deficiency anemia, unspecified: Secondary | ICD-10-CM | POA: Diagnosis not present

## 2016-12-14 DIAGNOSIS — E43 Unspecified severe protein-calorie malnutrition: Secondary | ICD-10-CM | POA: Diagnosis not present

## 2016-12-14 DIAGNOSIS — I10 Essential (primary) hypertension: Secondary | ICD-10-CM | POA: Diagnosis not present

## 2016-12-14 DIAGNOSIS — F329 Major depressive disorder, single episode, unspecified: Secondary | ICD-10-CM | POA: Diagnosis not present

## 2016-12-14 DIAGNOSIS — I739 Peripheral vascular disease, unspecified: Secondary | ICD-10-CM | POA: Diagnosis not present

## 2016-12-14 DIAGNOSIS — J438 Other emphysema: Secondary | ICD-10-CM | POA: Diagnosis not present

## 2016-12-17 DIAGNOSIS — D509 Iron deficiency anemia, unspecified: Secondary | ICD-10-CM | POA: Diagnosis not present

## 2016-12-17 DIAGNOSIS — E43 Unspecified severe protein-calorie malnutrition: Secondary | ICD-10-CM | POA: Diagnosis not present

## 2016-12-17 DIAGNOSIS — J438 Other emphysema: Secondary | ICD-10-CM | POA: Diagnosis not present

## 2016-12-17 DIAGNOSIS — I739 Peripheral vascular disease, unspecified: Secondary | ICD-10-CM | POA: Diagnosis not present

## 2016-12-17 DIAGNOSIS — F329 Major depressive disorder, single episode, unspecified: Secondary | ICD-10-CM | POA: Diagnosis not present

## 2016-12-17 DIAGNOSIS — I10 Essential (primary) hypertension: Secondary | ICD-10-CM | POA: Diagnosis not present

## 2017-01-01 ENCOUNTER — Other Ambulatory Visit (INDEPENDENT_AMBULATORY_CARE_PROVIDER_SITE_OTHER): Payer: Self-pay | Admitting: Vascular Surgery

## 2017-01-01 ENCOUNTER — Other Ambulatory Visit: Payer: Self-pay | Admitting: Internal Medicine

## 2017-01-22 ENCOUNTER — Ambulatory Visit: Payer: Medicare Other | Attending: Nurse Practitioner | Admitting: Nurse Practitioner

## 2017-01-22 ENCOUNTER — Encounter: Payer: Self-pay | Admitting: Nurse Practitioner

## 2017-01-22 VITALS — BP 121/57 | HR 79 | Temp 98.1°F | Resp 18 | Ht 66.0 in | Wt 113.0 lb

## 2017-01-22 DIAGNOSIS — J189 Pneumonia, unspecified organism: Secondary | ICD-10-CM | POA: Diagnosis not present

## 2017-01-22 DIAGNOSIS — Z9981 Dependence on supplemental oxygen: Secondary | ICD-10-CM | POA: Diagnosis not present

## 2017-01-22 DIAGNOSIS — I739 Peripheral vascular disease, unspecified: Secondary | ICD-10-CM | POA: Insufficient documentation

## 2017-01-22 DIAGNOSIS — E559 Vitamin D deficiency, unspecified: Secondary | ICD-10-CM | POA: Diagnosis not present

## 2017-01-22 DIAGNOSIS — Z86718 Personal history of other venous thrombosis and embolism: Secondary | ICD-10-CM | POA: Insufficient documentation

## 2017-01-22 DIAGNOSIS — Z79891 Long term (current) use of opiate analgesic: Secondary | ICD-10-CM | POA: Diagnosis not present

## 2017-01-22 DIAGNOSIS — M9983 Other biomechanical lesions of lumbar region: Secondary | ICD-10-CM | POA: Diagnosis not present

## 2017-01-22 DIAGNOSIS — Z7902 Long term (current) use of antithrombotics/antiplatelets: Secondary | ICD-10-CM | POA: Diagnosis not present

## 2017-01-22 DIAGNOSIS — G2581 Restless legs syndrome: Secondary | ICD-10-CM | POA: Insufficient documentation

## 2017-01-22 DIAGNOSIS — M48061 Spinal stenosis, lumbar region without neurogenic claudication: Secondary | ICD-10-CM | POA: Diagnosis not present

## 2017-01-22 DIAGNOSIS — E43 Unspecified severe protein-calorie malnutrition: Secondary | ICD-10-CM | POA: Diagnosis not present

## 2017-01-22 DIAGNOSIS — I10 Essential (primary) hypertension: Secondary | ICD-10-CM | POA: Insufficient documentation

## 2017-01-22 DIAGNOSIS — J440 Chronic obstructive pulmonary disease with acute lower respiratory infection: Secondary | ICD-10-CM | POA: Insufficient documentation

## 2017-01-22 DIAGNOSIS — R112 Nausea with vomiting, unspecified: Secondary | ICD-10-CM | POA: Insufficient documentation

## 2017-01-22 DIAGNOSIS — M47816 Spondylosis without myelopathy or radiculopathy, lumbar region: Secondary | ICD-10-CM

## 2017-01-22 DIAGNOSIS — M5417 Radiculopathy, lumbosacral region: Secondary | ICD-10-CM | POA: Insufficient documentation

## 2017-01-22 DIAGNOSIS — K219 Gastro-esophageal reflux disease without esophagitis: Secondary | ICD-10-CM | POA: Diagnosis not present

## 2017-01-22 DIAGNOSIS — Z780 Asymptomatic menopausal state: Secondary | ICD-10-CM | POA: Diagnosis not present

## 2017-01-22 DIAGNOSIS — M47896 Other spondylosis, lumbar region: Secondary | ICD-10-CM | POA: Insufficient documentation

## 2017-01-22 DIAGNOSIS — G894 Chronic pain syndrome: Secondary | ICD-10-CM | POA: Diagnosis not present

## 2017-01-22 MED ORDER — HYDROCODONE-ACETAMINOPHEN 5-325 MG PO TABS: 1.0000 | ORAL_TABLET | Freq: Three times a day (TID) | ORAL | 0 refills | Status: DC | PRN

## 2017-01-22 NOTE — Progress Notes (Signed)
Patient's Name: Tasha Reyes  MRN: [id number]  Referring Provider: Glean Hess, MD  DOB: 1930-09-19  PCP: Glean Hess, MD  DOS: 01/22/2017  Note by: Vevelyn Francois NP  Service setting: Ambulatory outpatient  Specialty: Interventional Pain Management  Location: [facility] (AMB) Pain Management Facility    Patient type: Established    Primary Reason(s) for Visit: Encounter for prescription drug management. (Level of risk: moderate)  CC: Back Pain (low) and Leg Pain (right,anterior and posterior to knee)  HPI  Tasha Reyes is a 83 y.o. year old, female patient, who comes today for a medication management evaluation. She has Dyslipidemia; Compulsive tobacco user syndrome; Chronic peptic ulcer with bleeding; Essential (primary) hypertension; Depression, major, recurrent, in partial remission ([place]); Alveolar emphysema of lung ([place]); Vascular disorder of lower extremity; Restless leg; Chronic tension-type headache, intractable; Chronic low back pain (Location of Secondary source of pain) (Bilateral) (R>L); Chronic lower extremity pain (Location of Primary Source of Pain) (Right); Lumbosacral Radiculopathy (Right); Chronic radicular lumbar pain (Right); Long term current use of opiate analgesic; Long term prescription opiate use; Opiate use; Peripheral vascular disease ([place]); Lumbar spondylosis; Hx of long term use of blood thinners (Plavix); Lower extremity pain (Right); Chronic pain syndrome; Neurogenic pain; History of DVT (deep vein thrombosis); Lumbar facet hypertrophy; Facet syndrome, lumbar ([place]); Lumbar foraminal stenosis (Right) (Severe at L4-5); Opioid-induced constipation ([place]); Osteoporosis, post-menopausal; GERD (gastroesophageal reflux disease); Vitamin D deficiency; Nausea and vomiting; Pneumonia of right middle lobe due to infectious organism [facility]); Protein-calorie malnutrition, severe; Dependence on supplemental oxygen; and Edema due to malnutrition [facility]) on her problem list. Her  primarily concern today is the Back Pain (low) and Leg Pain (right,anterior and posterior to knee)  Pain Assessment: Location: Lower Back Radiating: right leg Onset: More than a month ago Duration: Chronic pain Quality: Constant, Burning, Throbbing Severity: 3 /10 (self-reported pain score)  Note: Reported level is compatible with observation.                   Effect on ADL:   Timing: Constant Modifying factors: medications, propping right leg,   Tasha Reyes was last scheduled for an appointment on 10/24/16 for medication management. During today's appointment we reviewed Tasha Reyes's chronic pain status, as well as her outpatient medication regimen. She has radicular symptoms into her calf. She denies any numbness and tingling. She does have some weakness. She states that standing for long periods the pain gets worse. She denies any recent falls or injuries.  The patient  reports that she does not use drugs. Her body mass index is 18.24 kg/m.  Further details on both, my assessment(s), as well as the proposed treatment plan, please see below.  Controlled Substance Pharmacotherapy Assessment REMS (Risk Evaluation and Mitigation Strategy)  Analgesic:Hydrocodone/APAP 5/325 one tablet every 8 hours (15 mg/day) MME/day:15 mg/day.  Hart Rochester, RN  01/22/2017  1:32 PM  Sign at close encounter Nursing Pain Medication Assessment:  Safety precautions to be maintained throughout the outpatient stay will include: orient to surroundings, keep bed in low position, maintain call bell within reach at all times, provide assistance with transfer out of bed and ambulation.  Medication Inspection Compliance: Pill count conducted under aseptic conditions, in front of the patient. Neither the pills nor the bottle was removed from the patient's sight at any time. Once count was completed pills were immediately returned to the patient in their original bottle.  Medication:  Hydrocodone/APAP Pill/Patch Count: 46 of 90 pills remain Pill/Patch  Appearance: Markings consistent with prescribed medication Bottle Appearance: Standard pharmacy container. Clearly labeled. Filled Date: 07 / 02 / 2018 Last Medication intake:  Today   Pharmacokinetics: Liberation and absorption (onset of action): WNL Distribution (time to peak effect): WNL Metabolism and excretion (duration of action): WNL         Pharmacodynamics: Desired effects: Analgesia: Tasha Reyes reports >50% benefit. Functional ability: Patient reports that medication allows her to accomplish basic ADLs Clinically meaningful improvement in function (CMIF): Sustained CMIF goals met Perceived effectiveness: Described as relatively effective, allowing for increase in activities of daily living (ADL) Undesirable effects: Side-effects or Adverse reactions: None reported Monitoring: [place] PMP: Online review of the past 3-monthperiod conducted. Compliant with practice rules and regulations List of all UDS test(s) done:  Lab Results  Component Value Date   TOXASSSELUR FINAL 11/07/2015   TOXASSSELUR FINAL 08/09/2015   [place] 05/11/2015   Last UDS on record: ToxAssure Select 13  Date Value Ref Range Status  11/07/2015 FINAL  Final    Comment:    ==================================================================== TOXASSURE SELECT 13 (MW) ==================================================================== Test                             Result       Flag       Units Drug Present and Declared for Prescription Verification   Hydrocodone                    775          EXPECTED   ng/mg creat   Hydromorphone                  333          EXPECTED   ng/mg creat   Dihydrocodeine                 140          EXPECTED   ng/mg creat   Norhydrocodone                 968          EXPECTED   ng/mg creat    Sources of hydrocodone include scheduled prescription    medications. Hydromorphone, dihydrocodeine  and norhydrocodone are    expected metabolites of hydrocodone. Hydromorphone and    dihydrocodeine are also available as scheduled prescription    medications. ==================================================================== Test                      Result    Flag   Units      Ref Range   Creatinine              104              mg/dL      >=20 ==================================================================== Declared Medications:  The flagging and interpretation on this report are based on the  following declared medications.  Unexpected results may arise from  inaccuracies in the declared medications.  **Note: The testing scope of this panel includes these medications:  Hydrocodone (Norco)  Hydrocodone (Vicodin)  **Note: The testing scope of this panel does not include following  reported medications:  Acetaminophen (Norco)  Acetaminophen (Vicodin)  Atenolol (Tenormin)  Atorvastatin (Lipitor)  Clopidogrel (Plavix)  Iron (Ferrous Sulfate)  Lubiprostone (Amitiza)  Magnesium (Mag-Ox)  Methocarbamol (Robaxin)  Mirtazapine (Remeron)  Pantoprazole (Protonix)  Supplement ==================================================================== For clinical consultation, please  call [phone]. ====================================================================    UDS interpretation: Compliant          Medication Assessment Form: Reviewed. Patient indicates being compliant with therapy Treatment compliance: Compliant Risk Assessment Profile: Aberrant behavior: See prior evaluations. None observed or detected today Comorbid factors increasing risk of overdose: See prior notes. No additional risks detected today Risk of substance use disorder (SUD): Low Opioid Risk Tool (ORT) Total Score: 0  Interpretation Table:  Score <3 = Low Risk for SUD  Score between 4-7 = Moderate Risk for SUD  Score >8 = High Risk for Opioid Abuse   Risk Mitigation Strategies:  Patient  Counseling: Covered Patient-Prescriber Agreement (PPA): Present and active  Notification to other healthcare providers: Done  Pharmacologic Plan: No change in therapy, at this time  Laboratory Chemistry  Inflammation Markers (CRP: Acute Phase) (ESR: Chronic Phase) Lab Results  Component Value Date   CRP 1.1 (H) 01/08/2016   ESRSEDRATE 3 01/08/2016                 Renal Function Markers Lab Results  Component Value Date   BUN 8 10/28/2016   CREATININE 0.93 10/28/2016   GFRAA 65 10/28/2016   GFRNONAA 57 (L) 10/28/2016                 Hepatic Function Markers Lab Results  Component Value Date   AST 17 10/28/2016   ALT 8 10/28/2016   ALBUMIN 4.3 10/28/2016   ALKPHOS 97 10/28/2016                 Electrolytes Lab Results  Component Value Date   NA 145 (H) 10/28/2016   K 4.8 10/28/2016   CL 101 10/28/2016   CALCIUM 9.3 10/28/2016   MG 2.0 01/08/2016                 Neuropathy Markers No results found for: [id number]               Bone Pathology Markers Lab Results  Component Value Date   ALKPHOS 97 10/28/2016   VD25OH 5.0 (L) 01/08/2016   25OHVITD1 58 04/15/2016   25OHVITD2 32 04/15/2016   25OHVITD3 26 04/15/2016   CALCIUM 9.3 10/28/2016                 Coagulation Parameters Lab Results  Component Value Date   INR 1.1 02/21/2013   LABPROT 14.2 02/21/2013   APTT 110.0 (H) 11/15/2011   PLT 414 (H) 10/28/2016                 Cardiovascular Markers Lab Results  Component Value Date   BNP 416.0 (H) 08/19/2016   HGB 14.5 10/28/2016   HCT 43.6 10/28/2016                 Note: Lab results reviewed.  Recent Diagnostic Imaging Review  Dg Chest 2 View  Result Date: 10/04/2016 CLINICAL DATA:  Follow-up pneumonia EXAM: CHEST  2 VIEW COMPARISON:  08/19/2016 FINDINGS: Cardiomediastinal silhouette is stable. Hyperinflation again noted. Improvement in aeration in right middle lobe without residual infiltrate. Minimal residual streaky scarring. No new  infiltrate or pulmonary edema. IMPRESSION: Hyperinflation again noted. Improvement in aeration in right middle lobe without residual infiltrate. Minimal residual streaky scarring. No new infiltrate or pulmonary edema. Electronically Signed   By: Lahoma Crocker M.D.   On: 10/04/2016 15:10   Note: Imaging results reviewed.          Meds   Current  Meds  Medication Sig  . albuterol (PROVENTIL) (2.5 MG/3ML) 0.083% nebulizer solution Take 3 mLs (2.5 mg total) by nebulization every 4 (four) hours as needed for wheezing or shortness of breath.  Marland Kitchen atenolol (TENORMIN) 25 MG tablet Take 1 tablet (25 mg total) by mouth 2 (two) times daily.  Marland Kitchen atorvastatin (LIPITOR) 10 MG tablet TAKE 1 TABLET BY MOUTH EVERY NIGHT AT BEDTIME  . benzonatate (TESSALON) 100 MG capsule Take 1 capsule (100 mg total) by mouth 2 (two) times daily as needed for cough.  . Cholecalciferol (VITAMIN D3) 2000 units capsule Take 1 capsule (2,000 Units total) by mouth daily.  . clopidogrel (PLAVIX) 75 MG tablet TAKE 1 TABLET BY MOUTH DAILY  . ferrous sulfate 325 (65 FE) MG tablet TAKE 1 TABLET(325 MG) BY MOUTH DAILY  . [START ON 02/12/2017] HYDROcodone-acetaminophen (NORCO/VICODIN) 5-325 MG tablet Take 1 tablet by mouth every 8 (eight) hours as needed for severe pain.  Marland Kitchen MAGNESIUM-OXIDE 400 (241.3 Mg) MG tablet Take 1 tablet (400 mg total) by mouth daily. Reported on 11/07/2015  . methocarbamol (ROBAXIN) 750 MG tablet Take 1 tablet (750 mg total) by mouth 3 (three) times daily as needed for muscle spasms.  . mirtazapine (REMERON) 15 MG tablet TAKE 1 TABLET(15 MG) BY MOUTH AT BEDTIME  . pantoprazole (PROTONIX) 40 MG tablet TAKE 1 TABLET(40 MG) BY MOUTH TWICE DAILY  . polyethylene glycol powder (GLYCOLAX/MIRALAX) powder MIX AND TAKE 17 GRAMS BY MOUTH DAILY.  Marland Kitchen Wheat Dextrin (BENEFIBER) POWD Stir 2 tsp. TID into 4-8 oz of any non-carbonated beverage or soft food (hot or cold)  . [DISCONTINUED] HYDROcodone-acetaminophen (NORCO/VICODIN) 5-325 MG  tablet Take 1 tablet by mouth every 8 (eight) hours as needed for severe pain.    ROS  Constitutional: Denies any fever or chills Gastrointestinal: No reported hemesis, hematochezia, vomiting, or acute GI distress Musculoskeletal: Denies any acute onset joint swelling, redness, loss of ROM, or weakness Neurological: No reported episodes of acute onset apraxia, aphasia, dysarthria, agnosia, amnesia, paralysis, loss of coordination, or loss of consciousness  Allergies  Ms. Calender has No Known Allergies.  PFSH  Drug: Ms. Achorn  reports that she does not use drugs. Alcohol:  reports that she does not drink alcohol. Tobacco:  reports that she has been smoking Cigarettes.  She has a 30.50 pack-year smoking history. She has never used smokeless tobacco. Medical:  has a past medical history of Aneurysm of iliac artery ([place]) (05/11/2015); COPD (chronic obstructive pulmonary disease) ([place]); Cystitis; History of DVT (deep vein thrombosis) (05/11/2015); History of peptic ulcer disease (05/11/2015); [name]; Hypertension; Migraines; [name]; Stroke [facility]); and Vascular disease. Surgical: Ms. Majer  has a past surgical history that includes Partial hysterectomy; [name]; [name], 2008); Back surgery; Colonoscopy (2012); Esophagogastroduodenoscopy (04/2013); Abdominal hysterectomy; Appendectomy; and blood clot . Family: family history includes Brain cancer in her father; Kidney disease in her mother.  Constitutional Exam  General appearance: Well nourished, well developed, and well hydrated. In no apparent acute distress Vitals:   01/22/17 1313  BP: (!) 121/57  Pulse: 79  Resp: 18  Temp: 98.1 F (36.7 C)  TempSrc: Oral  SpO2: 91%  Weight: 113 lb (51.3 kg)  Height: _0  (1.676 m)   BMI Assessment: Estimated body mass index is 18.24 kg/m as calculated from the following:    Height as of this encounter: _1  (1.676 m).   Weight as of this encounter: 113 lb (51.3 kg).  BMI interpretation table: BMI level Category Range association with higher incidence of chronic pain  <18 kg/m2 Underweight   18.5-24.9 kg/m2 Ideal body weight   25-29.9 kg/m2 Overweight Increased incidence by 20%  30-34.9 kg/m2 Obese (Class I) Increased incidence by 68%  35-39.9 kg/m2 Severe obesity (Class II) Increased incidence by 136%  >40 kg/m2 Extreme obesity (Class III) Increased incidence by 254%   BMI Readings from Last 4 Encounters:  01/22/17 18.24 kg/m  10/28/16 18.24 kg/m  10/24/16 18.24 kg/m  10/04/16 18.40 kg/m   Wt Readings from Last 4 Encounters:  01/22/17 113 lb (51.3 kg)  10/28/16 113 lb (51.3 kg)  10/24/16 113 lb (51.3 kg)  10/04/16 114 lb (51.7 kg)  Psych/Mental status: Alert, oriented x 3 (person, place, & time)       Eyes: PERLA Respiratory: No evidence of acute respiratory distress  Cervical Spine Exam  Inspection: No masses, redness, or swelling Alignment: Symmetrical Functional ROM: Unrestricted ROM      Stability: No instability detected Muscle strength & Tone: Functionally intact Sensory: Unimpaired Palpation: No palpable anomalies              Upper Extremity (UE) Exam    Side: Right upper extremity  Side: Left upper extremity  Inspection: No masses, redness, swelling, or asymmetry. No contractures  Inspection: No masses, redness, swelling, or asymmetry. No contractures  Functional ROM: Unrestricted ROM          Functional ROM: Unrestricted ROM          Muscle strength & Tone: Functionally intact  Muscle strength & Tone: Functionally intact  Sensory: Unimpaired  Sensory: Unimpaired  Palpation: No palpable anomalies              Palpation: No palpable anomalies              Specialized Test(s): Deferred         Specialized Test(s): Deferred          Thoracic Spine Exam  Inspection: No masses, redness, or swelling Alignment:  Symmetrical Functional ROM: Unrestricted ROM Stability: No instability detected Sensory: Unimpaired Muscle strength & Tone: No palpable anomalies  Lumbar Spine Exam  Inspection: No masses, redness, or swelling Alignment: Symmetrical Functional ROM: Unrestricted ROM      Stability: No instability detected Muscle strength & Tone: Functionally intact Sensory: Unimpaired Palpation: Complains of area being tender to palpation       Provocative Tests: Lumbar Hyperextension and rotation test: Positive on the right for facet joint pain. Patrick's Maneuver: evaluation deferred today                    Gait & Posture Assessment  Ambulation: Unassisted Gait: Relatively normal for age and body habitus Posture: WNL   Lower Extremity Exam    Side: Right lower extremity  Side: Left lower extremity  Inspection: No masses, redness, swelling, or asymmetry. No contractures  Inspection: No masses, redness, swelling, or asymmetry. No contractures  Functional ROM: Unrestricted ROM          Functional ROM: Unrestricted ROM          Muscle strength & Tone: Functionally intact  Muscle strength & Tone: Functionally intact  Sensory: Unimpaired  Sensory: Unimpaired  Palpation: No palpable anomalies  Palpation: No palpable anomalies   Assessment  Primary Diagnosis & Pertinent Problem List: The primary encounter diagnosis was Lumbar spondylosis. Diagnoses of Lumbosacral Radiculopathy (Right), Lumbar foraminal stenosis (Right) (Severe at L4-5), Vitamin D deficiency, Chronic pain  syndrome, and Long term current use of opiate analgesic were also pertinent to this visit.  Status Diagnosis  Controlled Controlled Controlled 1. Lumbar spondylosis   2. Lumbosacral Radiculopathy (Right)   3. Lumbar foraminal stenosis (Right) (Severe at L4-5)   4. Vitamin D deficiency   5. Chronic pain syndrome   6. Long term current use of opiate analgesic     Problems updated and reviewed during this visit: No problems  updated. Plan of Care  Pharmacotherapy (Medications Ordered): Meds ordered this encounter  Medications  . HYDROcodone-acetaminophen (NORCO/VICODIN) 5-325 MG tablet    Sig: Take 1 tablet by mouth every 8 (eight) hours as needed for severe pain.    Dispense:  90 tablet    Refill:  0    Do not place this medication, or any other prescription from our practice, on "Automatic Refill". Patient may have prescription filled one day early if pharmacy is closed on scheduled refill date. Do not fill until: 02/12/2017 To last until: 03/14/2017    Order Specific Question:   Supervising Provider    Answer:   Milinda Pointer [phone]  . HYDROcodone-acetaminophen (NORCO/VICODIN) 5-325 MG tablet    Sig: Take 1 tablet by mouth every 8 (eight) hours as needed for severe pain.    Dispense:  90 tablet    Refill:  0    Do not place this medication, or any other prescription from our practice, on "Automatic Refill". Patient may have prescription filled one day early if pharmacy is closed on scheduled refill date. Do not fill until:03/14/2017 To last until: 04/13/2017    Order Specific Question:   Supervising Provider    Answer:   Milinda Pointer [phone]  . HYDROcodone-acetaminophen (NORCO/VICODIN) 5-325 MG tablet    Sig: Take 1 tablet by mouth every 8 (eight) hours as needed for severe pain.    Dispense:  90 tablet    Refill:  0    Do not place this medication, or any other prescription from our practice, on "Automatic Refill". Patient may have prescription filled one day early if pharmacy is closed on scheduled refill date. Do not fill until: 04/13/2017 To last until: 05/13/2017    Order Specific Question:   Supervising Provider    Answer:   Milinda Pointer [phone]   New Prescriptions   No medications on file   Medications administered today: Ms. Yerby had no medications administered during this visit. Lab-work, procedure(s), and/or referral(s): Orders Placed This Encounter  Procedures  .  ToxASSURE Select 13 (MW), Urine   Imaging and/or referral(s): None  Interventional therapies: Planned, scheduled, and/or pending:   Not at this time.   Considering:   Diagnostic right L4-5 transforaminal epidural steroid injection  Diagnostic right L4-5 lumbar epidural steroid injection  Diagnostic bilateral lumbar facet block  Possible bilateral lumbar facet radiofrequency ablation    Palliative PRN treatment(s):   Diagnostic right L4-5 transforaminal epidural steroid injection Diagnostic right L4-5 lumbar epidural steroid injection  Diagnostic bilateral lumbar facet block    Provider-requested follow-up: Return in about 3 months (around 04/24/2017) for MedMgmt.  Future Appointments Date Time Provider [place]  03/18/2017 10:30 AM [facility] None  03/18/2017 11:00 AM [facility] None  03/18/2017 11:45 AM Dew, Erskine Squibb, MD [facility] None  03/19/2017 9:30 AM Glean Hess, MD [facility] None  04/22/2017 1:30 PM Vevelyn Francois, NP [facility] None   Primary Care Physician: Glean Hess, MD Location: [facility] Note by: Donella Stade  Dorrene German NP Date: 01/22/2017; Time: 9:12 AM  Pain Score Disclaimer: We use the NRS-11 scale. This is a self-reported, subjective measurement of pain severity with only modest accuracy. It is used primarily to identify changes within a particular patient. It must be understood that outpatient pain scales are significantly less accurate that those used for research, where they can be applied under ideal controlled circumstances with minimal exposure to variables. In reality, the score is likely to be a combination of pain intensity and pain affect, where pain affect describes the degree of emotional arousal or changes in action readiness caused by the sensory experience of pain. Factors such as social and work situation, setting, emotional state, anxiety levels, expectation, and prior pain experience may  influence pain perception and show large inter-individual differences that may also be affected by time variables.  Patient instructions provided during this appointment: Patient Instructions   ____________________________________________________________________________________________  Medication Rules  Applies to: All patients receiving prescriptions (written or electronic).  Pharmacy of record: Pharmacy where electronic prescriptions will be sent. If written prescriptions are taken to a different pharmacy, please inform the nursing staff. The pharmacy listed in the electronic medical record should be the one where you would like electronic prescriptions to be sent.  Prescription refills: Only during scheduled appointments. Applies to both, written and electronic prescriptions.  NOTE: The following applies primarily to controlled substances (Opioid* Pain Medications).   Patient's responsibilities: 1. Pain Pills: Bring all pain pills to every appointment (except for procedure appointments). 2. Pill Bottles: Bring pills in original pharmacy bottle. Always bring newest bottle. Bring bottle, even if empty. 3. Medication refills: You are responsible for knowing and keeping track of what medications you need refilled. The day before your appointment, write a list of all prescriptions that need to be refilled. Bring that list to your appointment and give it to the admitting nurse. Prescriptions will be written only during appointments. If you forget a medication, it will not be "Called in", "Faxed", or "electronically sent". You will need to get another appointment to get these prescribed. 4. Prescription Accuracy: You are responsible for carefully inspecting your prescriptions before leaving our office. Have the discharge nurse carefully go over each prescription with you, before taking them home. Make sure that your name is accurately spelled, that your address is correct. Check the name and dose  of your medication to make sure it is accurate. Check the number of pills, and the written instructions to make sure they are clear and accurate. Make sure that you are given enough medication to last until your next medication refill appointment. 5. Taking Medication: Take medication as prescribed. Never take more pills than instructed. Never take medication more frequently than prescribed. Taking less pills or less frequently is permitted and encouraged, when it comes to controlled substances (written prescriptions).  6. Inform other Doctors: Always inform, all of your healthcare providers, of all the medications you take. 7. Pain Medication from other Providers: You are not allowed to accept any additional pain medication from any other Doctor or Healthcare provider. There are two exceptions to this rule. (see below) In the event that you require additional pain medication, you are responsible for notifying us, as stated below. 8. Medication Agreement: You are responsible for carefully reading and following our Medication Agreement. This must be signed before receiving any prescriptions from our practice. Safely store a copy of your signed Agreement. Violations to the Agreement will result in no further prescriptions. (Additional copies of our  Medication Agreement are available upon request.) 9. Laws, Rules, & Regulations: All patients are expected to follow all Federal and [employer], [employer], Rules, [employer]. Ignorance of the Laws does not constitute a valid excuse. The use of any illegal substances is prohibited. 10. Adopted CDC guidelines & recommendations: Target dosing levels will be at or below 60 MME/day. Use of benzodiazepines** is not recommended.  Exceptions: There are only two exceptions to the rule of not receiving pain medications from other Healthcare Providers. 1. Exception #1 (Emergencies): In the event of an emergency (i.e.: accident requiring emergency care), you are allowed to  receive additional pain medication. However, you are responsible for: As soon as you are able, call our office [phone], at any time of the day or night, and leave a message stating your name, the date and nature of the emergency, and the name and dose of the medication prescribed. In the event that your call is answered by a member of our staff, make sure to document and save the date, time, and the name of the person that took your information.  2. Exception #2 (Planned Surgery): In the event that you are scheduled by another doctor or dentist to have any type of surgery or procedure, you are allowed (for a period no longer than 30 days), to receive additional pain medication, for the acute post-op pain. However, in this case, you are responsible for picking up a copy of our "Post-op Pain Management for Surgeons" handout, and giving it to your surgeon or dentist. This document is available at our office, and does not require an appointment to obtain it. Simply go to our office during business hours (Monday-Thursday from 8:00 AM to 4:00 PM) (Friday 8:00 AM to 12:00 Noon) or if you have a scheduled appointment with [place], prior to your surgery, and ask for it by name. In addition, you will need to provide [place] with your name, name of your surgeon, type of surgery, and date of procedure or surgery.  *Opioid medications include: morphine, codeine, oxycodone, oxymorphone, hydrocodone, hydromorphone, meperidine, tramadol, tapentadol, buprenorphine, fentanyl, methadone. **Benzodiazepine medications include: diazepam (Valium), alprazolam (Xanax), clonazepam (Klonopine), lorazepam (Ativan), clorazepate (Tranxene), chlordiazepoxide (Librium), estazolam (Prosom), oxazepam (Serax), temazepam (Restoril), triazolam (Halcion)  ____________________________________________________________________________________________

## 2017-01-22 NOTE — Progress Notes (Signed)
Nursing Pain Medication Assessment:  Safety precautions to be maintained throughout the outpatient stay will include: orient to surroundings, keep bed in low position, maintain call bell within reach at all times, provide assistance with transfer out of bed and ambulation.  Medication Inspection Compliance: Pill count conducted under aseptic conditions, in front of the patient. Neither the pills nor the bottle was removed from the patient's sight at any time. Once count was completed pills were immediately returned to the patient in their original bottle.  Medication: Hydrocodone/APAP Pill/Patch Count: 46 of 90 pills remain Pill/Patch Appearance: Markings consistent with prescribed medication Bottle Appearance: Standard pharmacy container. Clearly labeled. Filled Date: 07 / 02 / 2018 Last Medication intake:  Today

## 2017-01-22 NOTE — Patient Instructions (Signed)

## 2017-01-28 LAB — TOXASSURE SELECT 13 (MW), URINE

## 2017-01-29 ENCOUNTER — Other Ambulatory Visit (INDEPENDENT_AMBULATORY_CARE_PROVIDER_SITE_OTHER): Payer: Self-pay | Admitting: Vascular Surgery

## 2017-02-01 ENCOUNTER — Other Ambulatory Visit: Payer: Self-pay | Admitting: Pain Medicine

## 2017-02-01 DIAGNOSIS — E559 Vitamin D deficiency, unspecified: Secondary | ICD-10-CM

## 2017-02-16 ENCOUNTER — Other Ambulatory Visit: Payer: Self-pay | Admitting: Internal Medicine

## 2017-02-16 DIAGNOSIS — E43 Unspecified severe protein-calorie malnutrition: Secondary | ICD-10-CM

## 2017-02-26 ENCOUNTER — Other Ambulatory Visit (INDEPENDENT_AMBULATORY_CARE_PROVIDER_SITE_OTHER): Payer: Self-pay | Admitting: Vascular Surgery

## 2017-03-02 ENCOUNTER — Other Ambulatory Visit: Payer: Self-pay | Admitting: Internal Medicine

## 2017-03-13 ENCOUNTER — Other Ambulatory Visit (INDEPENDENT_AMBULATORY_CARE_PROVIDER_SITE_OTHER): Payer: Self-pay | Admitting: Vascular Surgery

## 2017-03-13 DIAGNOSIS — Z95828 Presence of other vascular implants and grafts: Secondary | ICD-10-CM

## 2017-03-18 ENCOUNTER — Ambulatory Visit (INDEPENDENT_AMBULATORY_CARE_PROVIDER_SITE_OTHER): Payer: Medicare Other

## 2017-03-18 ENCOUNTER — Encounter (INDEPENDENT_AMBULATORY_CARE_PROVIDER_SITE_OTHER): Payer: Self-pay | Admitting: Vascular Surgery

## 2017-03-18 ENCOUNTER — Ambulatory Visit (INDEPENDENT_AMBULATORY_CARE_PROVIDER_SITE_OTHER): Payer: Medicare Other | Admitting: Vascular Surgery

## 2017-03-18 ENCOUNTER — Other Ambulatory Visit (INDEPENDENT_AMBULATORY_CARE_PROVIDER_SITE_OTHER): Payer: Medicare Other

## 2017-03-18 VITALS — BP 127/77 | HR 62 | Resp 15 | Ht 66.0 in | Wt 109.0 lb

## 2017-03-18 DIAGNOSIS — I739 Peripheral vascular disease, unspecified: Secondary | ICD-10-CM

## 2017-03-18 DIAGNOSIS — I10 Essential (primary) hypertension: Secondary | ICD-10-CM

## 2017-03-18 DIAGNOSIS — I714 Abdominal aortic aneurysm, without rupture, unspecified: Secondary | ICD-10-CM | POA: Insufficient documentation

## 2017-03-18 DIAGNOSIS — Z95828 Presence of other vascular implants and grafts: Secondary | ICD-10-CM | POA: Diagnosis not present

## 2017-03-18 NOTE — Assessment & Plan Note (Signed)
blood pressure control important in reducing the progression of atherosclerotic disease. On appropriate oral medications.  

## 2017-03-18 NOTE — Assessment & Plan Note (Signed)
Repair 10 years ago now. Her aortic duplex today shows an aortic sac size of less than 2.5 cm at this point with a patent stent graft on the left and a known occlusion of the right limb. We will continue to follow this on an annual basis unless other problems develop. Continue current medical regimen including Plavix and Lipitor.

## 2017-03-18 NOTE — Progress Notes (Signed)
MRN : [id number]  Tasha Reyes is a 83 y.o. (Oct 16, 1930) female who presents with chief complaint of  Chief Complaint  Patient presents with  . Follow-up    1 yr ABI,EVAR  .  History of Present Illness: Patient returns today in follow up of her AAA and PVD. She is now 10 years status post abdominal aortic aneurysm stent graft repair with femoral-femoral bypass. She is doing well currently without any lifestyle limiting claudication, ischemic rest pain, or tissue loss. She has mild leg pain with activity. She has no current aneurysm related symptoms. Specifically, the patient denies new back or abdominal pain, or signs of peripheral embolization. Her aortic duplex today shows an aortic sac size of less than 2.5 cm at this point with a patent stent graft on the left and a known occlusion of the right limb.   Current Outpatient Prescriptions  Medication Sig Dispense Refill  . albuterol (PROVENTIL) (2.5 MG/3ML) 0.083% nebulizer solution Take 3 mLs (2.5 mg total) by nebulization every 4 (four) hours as needed for wheezing or shortness of breath. 75 mL 12  . atenolol (TENORMIN) 25 MG tablet Take 1 tablet (25 mg total) by mouth 2 (two) times daily. 60 tablet 5  . atorvastatin (LIPITOR) 10 MG tablet TAKE 1 TABLET BY MOUTH EVERY NIGHT AT BEDTIME 90 tablet 0  . benzonatate (TESSALON) 100 MG capsule Take 1 capsule (100 mg total) by mouth 2 (two) times daily as needed for cough. 30 capsule 2  . Cholecalciferol (VITAMIN D3) 2000 units capsule Take 1 capsule (2,000 Units total) by mouth daily. 30 capsule PRN  . clopidogrel (PLAVIX) 75 MG tablet TAKE 1 TABLET BY MOUTH DAILY 30 tablet 5  . ferrous sulfate 325 (65 FE) MG tablet TAKE 1 TABLET(325 MG) BY MOUTH DAILY 30 tablet 12  . [START ON 04/13/2017] HYDROcodone-acetaminophen (NORCO/VICODIN) 5-325 MG tablet Take 1 tablet by mouth every 8 (eight) hours as needed for severe pain. 90 tablet 0  . mirtazapine (REMERON) 15 MG tablet TAKE 1 TABLET(15 MG) BY  MOUTH AT BEDTIME 30 tablet 5  . pantoprazole (PROTONIX) 40 MG tablet TAKE 1 TABLET(40 MG) BY MOUTH TWICE DAILY 60 tablet 5  . polyethylene glycol powder (GLYCOLAX/MIRALAX) powder MIX AND TAKE 17 GRAMS BY MOUTH prn  5  . HYDROcodone-acetaminophen (NORCO/VICODIN) 5-325 MG tablet Take 1 tablet by mouth every 8 (eight) hours as needed for severe pain. 90 tablet 0  . HYDROcodone-acetaminophen (NORCO/VICODIN) 5-325 MG tablet Take 1 tablet by mouth every 8 (eight) hours as needed for severe pain. (Patient not taking: Reported on 03/18/2017) 90 tablet 0  . MAGNESIUM-OXIDE 400 (241.3 Mg) MG tablet Take 1 tablet (400 mg total) by mouth daily. Reported on 11/07/2015 90 tablet 0  . methocarbamol (ROBAXIN) 750 MG tablet Take 1 tablet (750 mg total) by mouth 3 (three) times daily as needed for muscle spasms. 90 tablet 2  . Wheat Dextrin (BENEFIBER) POWD Stir 2 tsp. TID into 4-8 oz of any non-carbonated beverage or soft food (hot or cold) (Patient not taking: Reported on 03/18/2017) 500 g 2   No current facility-administered medications for this visit.     Past Medical History:  Diagnosis Date  . Aneurysm of iliac artery (HCC) 05/11/2015  . COPD (chronic obstructive pulmonary disease) ([place])   . Cystitis   . History of DVT (deep vein thrombosis) 05/11/2015  . History of peptic ulcer disease 05/11/2015  . [name]   . Hypertension   . Migraines   .  [name]   . Stroke ([place])   . Vascular disease     Past Surgical History:  Procedure Laterality Date  . ABDOMINAL AORTIC ANEURYSM REPAIR W/ ENDOLUMINAL GRAFT  2008  . ABDOMINAL HYSTERECTOMY    . APPENDECTOMY    . BACK SURGERY    . blood clot      removal right leg  . COLONOSCOPY  2012  . ESOPHAGOGASTRODUODENOSCOPY  04/2013   gastric ulcers  . FEMORAL-POPLITEAL BYPASS GRAFT Right 2008  . PARTIAL HYSTERECTOMY      Social History Social History  Substance Use Topics  . Smoking status: Current Every Day Smoker    Packs/day:  0.50    Years: 61.00    Types: Cigarettes  . Smokeless tobacco: Never Used  . Alcohol use No    Family History Family History  Problem Relation Age of Onset  . Brain cancer Father   . Kidney disease Mother     No Known Allergies   REVIEW OF SYSTEMS (Negative unless checked)  Constitutional: [x] Weight loss  [] Fever  [] Chills Cardiac: [] Chest pain   [] Chest pressure   [] Palpitations   [] Shortness of breath when laying flat   [] Shortness of breath at rest   [x] Shortness of breath with exertion. Vascular:  [x] Pain in legs with walking   [] Pain in legs at rest   [] Pain in legs when laying flat   [] Claudication   [] Pain in feet when walking  [] Pain in feet at rest  [] Pain in feet when laying flat   [] History of DVT   [] Phlebitis   [] Swelling in legs   [] Varicose veins   [] Non-healing ulcers Pulmonary:   [] Uses home oxygen   [] Productive cough   [] Hemoptysis   [] Wheeze  [x] COPD   [] Asthma Neurologic:  [] Dizziness  [] Blackouts   [] Seizures   [x] History of stroke   [] History of TIA  [] Aphasia   [] Temporary blindness   [] Dysphagia   [] Weakness or numbness in arms   [] Weakness or numbness in legs Musculoskeletal:  [x] Arthritis   [] Joint swelling   [] Joint pain   [] Low back pain Hematologic:  [] Easy bruising  [] Easy bleeding   [] Hypercoagulable state   [] Anemic   Gastrointestinal:  [] Blood in stool   [] Vomiting blood  [] Gastroesophageal reflux/heartburn   [] Abdominal pain Genitourinary:  [] Chronic kidney disease   [] Difficult urination  [] Frequent urination  [] Burning with urination   [] Hematuria Skin:  [] Rashes   [] Ulcers   [] Wounds Psychological:  [] History of anxiety   []  History of major depression.  Physical Examination  BP 127/77 (BP Location: Right Arm)   Pulse 62   Resp 15   Ht 5\' 6"  (1.676 m)   Wt 49.4 kg (109 lb)   BMI 17.59 kg/m  Gen:  Thin, NAD, appears younger than stated age Head: [place]/AT, No temporalis wasting. Ear/Nose/Throat: Hearing grossly intact, nares w/o erythema or  drainage, trachea midline Eyes: Conjunctiva clear. Sclera non-icteric Neck: Supple.  No JVD.  Pulmonary:  Good air movement, no use of accessory muscles.  Cardiac: RRR, normal S1, S2 Vascular:  Vessel Right Left  Radial Palpable Palpable                          PT Palpable Palpable  DP Palpable Palpable   Gastrointestinal: soft, non-tender/non-distended. No increased aortic impulse Musculoskeletal: M/S 5/5 throughout.  No deformity or atrophy.  Neurologic: Sensation grossly intact in extremities.  Symmetrical.  Speech is fluent.  Psychiatric: Judgment intact,  Mood & affect appropriate for pt's clinical situation. Dermatologic: No rashes or ulcers noted.  No cellulitis or open wounds.       Labs Recent Results (from the past 2160 hour(s))  ToxASSURE Select 13 (MW), Urine     Status: None   Collection Time: 01/22/17  2:04 PM  Result Value Ref Range   Summary FINAL     Comment: ==================================================================== TOXASSURE SELECT 13 (MW) ==================================================================== Test                             Result       Flag       Units Drug Present and Declared for Prescription Verification   Hydrocodone                    1667         EXPECTED   ng/mg creat   Hydromorphone                  315          EXPECTED   ng/mg creat   Dihydrocodeine                 219          EXPECTED   ng/mg creat   Norhydrocodone                 >2347        EXPECTED   ng/mg creat    Sources of hydrocodone include scheduled prescription    medications. Hydromorphone, dihydrocodeine and norhydrocodone are    expected metabolites of hydrocodone. Hydromorphone and    dihydrocodeine are also available as scheduled prescription    medications. ==================================================================== Test                      Result    Flag   Units      Ref Range   Creatinine              213               mg/dL       >=20 ==================================================================== Declared Medications:  The flagging and interpretation on this report are based on the  following declared medications.  Unexpected results may arise from  inaccuracies in the declared medications.  **Note: The testing scope of this panel includes these medications:  Hydrocodone (Hydrocodone-Acetaminophen)  **Note: The testing scope of this panel does not include following  reported medications:  Acetaminophen (Hydrocodone-Acetaminophen)  Albuterol  Atenolol  Atorvastatin  Benzonatate  Cholecalciferol  Clopidogrel  Iron (Ferrous Sulfate)  Magnesium Oxide  Methocarbamol  Mirtazapine  Pantoprazole (Protonix)  Polyethylene Glycol  Supplement ==================================================================== For clinical consultation, please call [phone]. ====================================================================     Radiology No results found.    Assessment/Plan  Essential (primary) hypertension blood pressure control important in reducing the progression of atherosclerotic disease. On appropriate oral medications.   Peripheral vascular disease ([place]) No current lifestyle limiting claudication with good pedal pulses. ABIs normal last year. We'll plan to check again at next visit next year.  AAA (abdominal aortic aneurysm) without rupture ([place]) Repair 10 years ago now. Her aortic duplex today shows an aortic sac size of less than 2.5 cm at this point with a patent stent graft on the left and a known occlusion of the right limb. We will continue to follow this on an annual basis unless  other problems develop. Continue current medical regimen including Plavix and Lipitor.    Leotis Pain, MD  03/18/2017 12:56 PM    This note was created with Dragon medical transcription system.  Any errors from dictation are purely unintentional

## 2017-03-18 NOTE — Patient Instructions (Signed)
Abdominal Aortic Aneurysm Endograft Repair Abdominal aortic aneurysm endograft repair is a surgery to fix an aortic aneurysm in the abdominal area. An aneurysm is a weak or damaged part of an artery wall that bulges out from the normal force of blood pumping through the body. An abdominal aortic aneurysm is an aneurysm that happens in the lower part of the aorta, which is the main artery of the body. The repair is often done if the aneurysm gets so large that it might burst (rupture). A ruptured aneurysm would cause bleeding inside the body that could put a person's life in danger. Before that happens, this procedure is needed to fix the problem. The procedure may also be done if the aneurysm causes symptoms such as pain in the back, abdomen, or side. In this procedure, a tube made of fabric and metal mesh (endograft or stent-graft) is placed in the weak part of the aorta to repair it. Tell a health care provider about:  Any allergies you have.  All medicines you are taking, including vitamins, herbs, eye drops, creams, and over-the-counter medicines.  Any problems you or family members have had with anesthetic medicines.  Any blood disorders you have.  Any surgeries you have had.  Any medical conditions you have.  Whether you are pregnant or may be pregnant. What are the risks? Generally, this is a safe procedure. However, problems may occur, including:  Infection of the graft or incision area.  Bleeding during the procedure or from the incision site.  Allergic reactions to medicines.  Damage to other structures or organs.  Blood leaking out around the endograft.  The endograft moving from where it was placed during surgery.  Blood flow through the graft becoming blocked.  Blood clots.  Kidney problems.  Blood flow to the legs becoming blocked (rare).  Rupture of the aorta even after the endograft repair is a success (rare).  What happens before the procedure? Staying  hydrated Follow instructions from your health care provider about hydration, which may include:  Up to 2 hours before the procedure - you may continue to drink clear liquids, such as water, clear fruit juice, black coffee, and plain tea.  Eating and drinking restrictions Follow instructions from your health care provider about eating and drinking, which may include:  8 hours before the procedure - stop eating heavy meals or foods such as meat, fried foods, or fatty foods.  6 hours before the procedure - stop eating light meals or foods, such as toast or cereal.  6 hours before the procedure - stop drinking milk or drinks that contain milk.  2 hours before the procedure - stop drinking clear liquids.  Medicines  Ask your health care provider about: ? Changing or stopping your regular medicines. This is especially important if you are taking diabetes medicines or blood thinners. ? Taking medicines such as aspirin and ibuprofen. These medicines can thin your blood. Do not take these medicines before your procedure if your health care provider instructs you not to.  You may be given antibiotic medicine to help prevent infection. General instructions  You may need to have blood tests, a test to check heart rhythm (electrocardiogram, or ECG), or a test to check blood flow (angiogram) before the surgery.  Imaging tests will be done to check the size and location of the aneurysm. These tests could include an ultrasound, a CT scan, or an MRI.  Do not use any products that contain nicotine or tobacco-such as cigarettes and  e-cigarettes-for as long as possible before the surgery. If you need help quitting, ask your health care provider.  Ask your health care provider how your surgical site will be marked or identified.  Plan to have someone take you home from the hospital or clinic. What happens during the procedure?  To reduce your risk of infection: ? Your health care team will wash or  sanitize their hands. ? Your skin will be washed with soap. ? Hair may be removed from the surgical area.  An IV tube will be inserted into one of your veins.  You will be given one or more of the following: ? A medicine to help you relax (sedative). ? A medicine to numb the area (local anesthetic). ? A medicine to make you fall asleep (general anesthetic). ? A medicine that is injected into an area of your body to numb everything below the injection site (regional anesthetic).  During the surgery: ? Small incisions or a puncture will be made on one or both sides of the groin. Long, thin tubes (catheters) will be passed through the opening, put into the artery in your thigh, and moved up into the aneurysm in the aorta. ? The health care provider will use live X-ray pictures to guide the endograft through the catheterto the place where the aneurysm is. ? The endograft will be released to seal off the aneurysm and to line the aorta. It will keep blood from flowing into the aneurysm and will help keep it from rupturing. The endograft will stay in place and will not be taken out. ? X-rays will be used to check where the endograft is placed and to make sure that it is where it should be. ? The catheter will be taken out, and the incision will be closed with stitches (sutures). The procedure may vary among health care providers and hospitals. What happens after the procedure?  Your blood pressure, heart rate, breathing rate, and blood oxygen level will be monitored until the medicines you were given have worn off.  You will need to lie flat for a number of hours. Bending your legs can cause them to bleed and swell.  You will then be urged to get up and move around a number of times each day and to slowly become more active.  You will be given medicines to control pain.  Certain tests may be done after your procedure to check how well the endograft is working and to check its placement.  Do  not drive for 24 hours if you received a sedative. This information is not intended to replace advice given to you by your health care provider. Make sure you discuss any questions you have with your health care provider. Document Released: 11/17/2008 Document Revised: 01/19/2016 Document Reviewed: 09/25/2015 Elsevier Interactive Patient Education  [employer].

## 2017-03-18 NOTE — Assessment & Plan Note (Signed)
No current lifestyle limiting claudication with good pedal pulses. ABIs normal last year. We'll plan to check again at next visit next year.

## 2017-03-19 ENCOUNTER — Ambulatory Visit (INDEPENDENT_AMBULATORY_CARE_PROVIDER_SITE_OTHER): Payer: Medicare Other | Admitting: Internal Medicine

## 2017-03-19 ENCOUNTER — Encounter: Payer: Self-pay | Admitting: Internal Medicine

## 2017-03-19 VITALS — BP 126/78 | HR 57 | Ht 66.0 in | Wt 109.0 lb

## 2017-03-19 DIAGNOSIS — Z0000 Encounter for general adult medical examination without abnormal findings: Secondary | ICD-10-CM

## 2017-03-19 DIAGNOSIS — E785 Hyperlipidemia, unspecified: Secondary | ICD-10-CM

## 2017-03-19 DIAGNOSIS — Z23 Encounter for immunization: Secondary | ICD-10-CM | POA: Diagnosis not present

## 2017-03-19 DIAGNOSIS — I714 Abdominal aortic aneurysm, without rupture, unspecified: Secondary | ICD-10-CM

## 2017-03-19 DIAGNOSIS — I10 Essential (primary) hypertension: Secondary | ICD-10-CM

## 2017-03-19 DIAGNOSIS — K219 Gastro-esophageal reflux disease without esophagitis: Secondary | ICD-10-CM | POA: Diagnosis not present

## 2017-03-19 DIAGNOSIS — J449 Chronic obstructive pulmonary disease, unspecified: Secondary | ICD-10-CM

## 2017-03-19 DIAGNOSIS — E43 Unspecified severe protein-calorie malnutrition: Secondary | ICD-10-CM | POA: Diagnosis not present

## 2017-03-19 DIAGNOSIS — E559 Vitamin D deficiency, unspecified: Secondary | ICD-10-CM

## 2017-03-19 DIAGNOSIS — F3341 Major depressive disorder, recurrent, in partial remission: Secondary | ICD-10-CM

## 2017-03-19 DIAGNOSIS — M4696 Unspecified inflammatory spondylopathy, lumbar region: Secondary | ICD-10-CM

## 2017-03-19 DIAGNOSIS — M47816 Spondylosis without myelopathy or radiculopathy, lumbar region: Secondary | ICD-10-CM

## 2017-03-19 LAB — POCT URINALYSIS DIPSTICK
BILIRUBIN UA: NEGATIVE
Blood, UA: NEGATIVE
Glucose, UA: NEGATIVE
KETONES UA: NEGATIVE
LEUKOCYTES UA: NEGATIVE
NITRITE UA: NEGATIVE
Spec Grav, UA: 1.01 (ref 1.010–1.025)
Urobilinogen, UA: 0.2 E.U./dL
pH, UA: 5 (ref 5.0–8.0)

## 2017-03-19 MED ORDER — MIRTAZAPINE 7.5 MG PO TABS: 7.5000 mg | ORAL_TABLET | Freq: Every day | ORAL | 0 refills | Status: DC

## 2017-03-19 MED ORDER — VITAMIN D3 50 MCG (2000 UT) PO CAPS: ORAL_CAPSULE | ORAL | 12 refills | Status: DC

## 2017-03-19 MED ORDER — PREDNISONE 5 MG PO TABS: 5.0000 mg | ORAL_TABLET | Freq: Every day | ORAL | 1 refills | Status: DC

## 2017-03-19 NOTE — Progress Notes (Signed)
Patient: Tasha Reyes, Female    DOB: 12/25/1930, 83 y.o.   MRN: [id number] Visit Date: 03/19/2017  Today's Provider: Halina Maidens, MD   Chief Complaint  Patient presents with  . Medicare Wellness    Breast Exam.    Subjective:    Annual wellness visit Tasha Reyes is a 83 y.o. female who presents today for her Subsequent Annual Wellness Visit. She feels fairly well. She reports exercising none due to SOB. She reports she is sleeping fairly well. Her appetite is poor.  She has cut way back on smoking - only 1-2 every few days when she gets upset.  ----------------------------------------------------------- Hypertension  This is a chronic problem. Associated symptoms include shortness of breath. Pertinent negatives include no chest pain, headaches or palpitations. The current treatment provides moderate improvement. Hypertensive end-organ damage includes PVD.  Hyperlipidemia  This is a chronic problem. Associated symptoms include shortness of breath. Pertinent negatives include no chest pain. Current antihyperlipidemic treatment includes statins. The current treatment provides significant improvement of lipids.  Gastroesophageal Reflux  She complains of coughing and wheezing. She reports no abdominal pain or no chest pain. The problem occurs rarely. Pertinent negatives include no fatigue. She has tried a PPI for the symptoms.  PVD - stable, follows with [facility].  Aortic graft is becoming stiff and may need to be replaced.  She has cold feet but no ulcers or wounds. COPD - stable, using albuterol intermittently.  Smoking only a few cigarettes on some days.  Not on oxygen.  Has recovered from pneumonia.   Weight loss - has poor appetite and has lost 4 more pounds.  Did not like Ensure, boost, carnation instant breakfast.  Eats protein bars but not as a snack, as a meal replacement.  Also prescribed remeron but it make her sleepy the next day so not taking regularly.  Vasc surgeon  suggested prednisone. Chronic back pain - followed by pain management.  Has muscle relaxants and hydrocodone but does not take them every day.   Review of Systems  Constitutional: Positive for unexpected weight change. Negative for chills, fatigue and fever.  HENT: Negative for hearing loss and trouble swallowing.   Eyes: Negative for visual disturbance.  Respiratory: Positive for cough, chest tightness, shortness of breath and wheezing.   Cardiovascular: Negative for chest pain, palpitations and leg swelling.  Gastrointestinal: Negative for abdominal pain, blood in stool, constipation and diarrhea.  Endocrine: Negative for polydipsia and polyuria.  Genitourinary: Negative for difficulty urinating.  Musculoskeletal: Positive for arthralgias and back pain.  Skin: Negative for color change and rash.  Allergic/Immunologic: Negative for environmental allergies.  Neurological: Positive for light-headedness. Negative for dizziness, tremors, weakness and headaches.  Hematological: Negative for adenopathy.  Psychiatric/Behavioral: Positive for dysphoric mood and sleep disturbance. Negative for behavioral problems and decreased concentration. The patient is not nervous/anxious.     Social History   Social History  . Marital status: Widowed    Spouse name: N/A  . Number of children: N/A  . Years of education: N/A   Occupational History  . Not on file.   Social History Main Topics  . Smoking status: Current Every Day Smoker    Packs/day: 0.50    Years: 61.00    Types: Cigarettes  . Smokeless tobacco: Never Used  . Alcohol use No  . Drug use: No  . Sexual activity: Not on file   Other Topics Concern  . Not on file   Social History Narrative  . No  narrative on file    Patient Active Problem List   Diagnosis Date Noted  . AAA (abdominal aortic aneurysm) without rupture ([place]) 03/18/2017  . Edema due to malnutrition ([place]) 10/28/2016  . Dependence on supplemental oxygen 09/04/2016   . Protein-calorie malnutrition, severe 08/20/2016  . Nausea and vomiting 07/30/2016  . Vitamin D deficiency 01/10/2016  . Opioid-induced constipation ([facility]) 11/07/2015  . Osteoporosis, post-menopausal 11/07/2015  . GERD (gastroesophageal reflux disease) 11/07/2015  . Lumbar foraminal stenosis (Right) (Severe at L4-5) 06/20/2015  . Chronic low back pain (Location of Secondary source of pain) (Bilateral) (R>L) 05/11/2015  . Chronic lower extremity pain (Location of Primary Source of Pain) (Right) 05/11/2015  . Lumbosacral Radiculopathy (Right) 05/11/2015  . Chronic radicular lumbar pain (Right) 05/11/2015  . Long term current use of opiate analgesic 05/11/2015  . Long term prescription opiate use 05/11/2015  . Opiate use 05/11/2015  . Peripheral vascular disease ([place]) 05/11/2015  . Lumbar spondylosis 05/11/2015  . Hx of long term use of blood thinners (Plavix) 05/11/2015  . Lower extremity pain (Right) 05/11/2015  . Chronic pain syndrome 05/11/2015  . Neurogenic pain 05/11/2015  . History of DVT (deep vein thrombosis) 05/11/2015  . Lumbar facet hypertrophy 05/11/2015  . Facet syndrome, lumbar ([place]) 05/11/2015  . Dyslipidemia 12/23/2014  . Compulsive tobacco user syndrome 12/23/2014  . Chronic peptic ulcer with bleeding 12/23/2014  . Essential (primary) hypertension 12/23/2014  . Depression, major, recurrent, in partial remission ([place]) 12/23/2014  . COPD (chronic obstructive pulmonary disease) ([place]) 12/23/2014  . Vascular disorder of lower extremity 12/23/2014  . Restless leg 12/23/2014  . Chronic tension-type headache, intractable 12/23/2014    Past Surgical History:  Procedure Laterality Date  . ABDOMINAL AORTIC ANEURYSM REPAIR W/ ENDOLUMINAL GRAFT  2008  . ABDOMINAL HYSTERECTOMY    . APPENDECTOMY    . BACK SURGERY    . blood clot      removal right leg  . COLONOSCOPY  2012  . ESOPHAGOGASTRODUODENOSCOPY  04/2013   gastric ulcers  . FEMORAL-POPLITEAL BYPASS GRAFT Right  2008  . PARTIAL HYSTERECTOMY      Her family history includes Brain cancer in her father; Kidney disease in her mother.     Previous Medications   ALBUTEROL (PROVENTIL) (2.5 MG/3ML) 0.083% NEBULIZER SOLUTION    Take 3 mLs (2.5 mg total) by nebulization every 4 (four) hours as needed for wheezing or shortness of breath.   ATENOLOL (TENORMIN) 25 MG TABLET    Take 1 tablet (25 mg total) by mouth 2 (two) times daily.   ATORVASTATIN (LIPITOR) 10 MG TABLET    TAKE 1 TABLET BY MOUTH EVERY NIGHT AT BEDTIME   CHOLECALCIFEROL (VITAMIN D3) 2000 UNITS CAPSULE    Take 1 capsule (2,000 Units total) by mouth daily.   CLOPIDOGREL (PLAVIX) 75 MG TABLET    TAKE 1 TABLET BY MOUTH DAILY   FERROUS SULFATE 325 (65 FE) MG TABLET    TAKE 1 TABLET(325 MG) BY MOUTH DAILY   HYDROCODONE-ACETAMINOPHEN (NORCO/VICODIN) 5-325 MG TABLET    Take 1 tablet by mouth every 8 (eight) hours as needed for severe pain.   METHOCARBAMOL (ROBAXIN) 750 MG TABLET    Take 1 tablet (750 mg total) by mouth 3 (three) times daily as needed for muscle spasms.   MIRTAZAPINE (REMERON) 15 MG TABLET    TAKE 1 TABLET(15 MG) BY MOUTH AT BEDTIME   PANTOPRAZOLE (PROTONIX) 40 MG TABLET    TAKE 1 TABLET(40 MG) BY MOUTH TWICE DAILY   POLYETHYLENE  GLYCOL POWDER (GLYCOLAX/MIRALAX) POWDER    MIX AND TAKE 17 GRAMS BY MOUTH prn    Patient Care Team: Glean Hess, MD as PCP - General (Family Medicine) [facility] as Surgeon (Vascular Surgery) Manya Silvas, MD (Gastroenterology)      Objective:   Vitals: BP (!) 142/78   Pulse (!) 57   Ht 5\' 6"  (1.676 m)   Wt 109 lb (49.4 kg)   SpO2 94%   BMI 17.59 kg/m   Physical Exam  Constitutional: She is oriented to person, place, and time. She appears cachectic. No distress.  HENT:  Head: Normocephalic and atraumatic.  Right Ear: Tympanic membrane and ear canal normal.  Left Ear: Tympanic membrane and ear canal normal.  Nose: Right sinus exhibits no maxillary sinus tenderness. Left  sinus exhibits no maxillary sinus tenderness.  Mouth/Throat: Uvula is midline and oropharynx is clear and moist.  Eyes: Conjunctivae and EOM are normal. Right eye exhibits no discharge. Left eye exhibits no discharge. No scleral icterus.  Neck: Normal range of motion. Carotid bruit is not present. No erythema present. No thyromegaly present.  Cardiovascular: Normal rate, regular rhythm and normal heart sounds.   Pulses:      Dorsalis pedis pulses are 0 on the right side, and 0 on the left side.       Posterior tibial pulses are 1+ on the right side, and 1+ on the left side.  Pulmonary/Chest: Effort normal. No respiratory distress. She has decreased breath sounds. She has no rhonchi. Right breast exhibits no mass, no nipple discharge, no skin change and no tenderness. Left breast exhibits no mass, no nipple discharge, no skin change and no tenderness.  Abdominal: Soft. Bowel sounds are normal. There is no hepatosplenomegaly. There is no tenderness. There is no CVA tenderness.    Lymphadenopathy:    She has no cervical adenopathy.    She has no axillary adenopathy.  Neurological: She is alert and oriented to person, place, and time. She has normal strength and normal reflexes. No cranial nerve deficit or sensory deficit. Gait normal.  Skin: Skin is warm, dry and intact. No rash noted.  Psychiatric: She has a normal mood and affect. Her speech is normal and behavior is normal. Thought content normal.  Nursing note and vitals reviewed.   Activities of Daily Living In your present state of health, do you have any difficulty performing the following activities: 03/19/2017 08/19/2016  Hearing? N N  Vision? N N  Difficulty concentrating or making decisions? N N  Walking or climbing stairs? N N  Dressing or bathing? N N  Doing errands, shopping? N N  Preparing Food and eating ? N -  Using the Toilet? N -  In the past six months, have you accidently leaked urine? N -  Do you have problems with loss  of bowel control? N -  Managing your Medications? N -  Managing your Finances? N -  Housekeeping or managing your Housekeeping? N -  Some recent data might be hidden    Fall Risk Assessment Fall Risk  03/19/2017 01/22/2017 07/30/2016 04/15/2016 01/10/2016  Falls in the past year? No No No Yes No  Number falls in past yr: - - - 2 or more -  Injury with Fall? - - - No -  Risk Factor Category  - - - High Fall Risk -  Risk for fall due to : - - - History of fall(s) -  Risk for fall due to:  Comment - - - leg gave out while walking  -  Follow up - - - Falls prevention discussed -     Depression Screen PHQ 2/9 Scores 03/19/2017 01/22/2017 07/30/2016 04/15/2016  PHQ - 2 Score 0 0 0 0  PHQ- 9 Score 0 - - -    6CIT Screen 03/19/2017  What Year? 0 points  What month? 0 points  What time? 0 points  Count back from 20 0 points  Months in reverse 0 points  Repeat phrase 2 points  Total Score 2    Medicare Annual Wellness Visit Summary:  Reviewed patient's Family Medical History Reviewed and updated list of patient's medical providers Assessment of cognitive impairment was done Assessed patient's functional ability Established a written schedule for health screening [place] Completed and Reviewed  Exercise Activities and Dietary recommendations Goals    None      Immunization History  Administered Date(s) Administered  . Pneumococcal Conjugate-13 10/13/2015  . Pneumococcal Polysaccharide-23 01/18/2014    Health Maintenance  Topic Date Due  . TETANUS/TDAP  07/28/1951  . DEXA SCAN  07/27/1997  . INFLUENZA VACCINE  04/14/2017 (Originally 02/12/2017)  . PNA vac Low Risk Adult  Completed    Discussed health benefits of physical activity, and encouraged her to engage in regular exercise appropriate for her age and condition.    ------------------------------------------------------------------------------------------------------------  Assessment & Plan:   1.  Medicare annual wellness visit, subsequent Measures satisfied - POCT urinalysis dipstick  2. Essential (primary) hypertension Controlled - mild orthostatic sx per patient Recommend moving more slowly from sitting to standing - Comprehensive metabolic panel  3. AAA (abdominal aortic aneurysm) without rupture ([facility]) Followed by [facility]  4. Gastroesophageal reflux disease without esophagitis Controlled on PPI - CBC with Differential/Platelet  5. Depression, major, recurrent, in partial remission (H[facility]) Stable but not tolerating remeron - change to 7.5 mg - mirtazapine (REMERON) 7.5 MG tablet; Take 1 tablet (7.5 mg total) by mouth at bedtime.  Dispense: 1 tablet; Refill: 0  6. Protein-calorie malnutrition, severe Begin prednisone 5 mg daily - recheck with BS in 6 wks - TSH  7. Chronic obstructive pulmonary disease, unspecified COPD type ([place]) - predniSONE (DELTASONE) 5 MG tablet; Take 1 tablet (5 mg total) by mouth daily with breakfast.  Dispense: 30 tablet; Refill: 1  8. Vitamin D deficiency Continue daily supplement - Cholecalciferol (VITAMIN D3) 2000 units capsule; Take 1 capsule (2,000 Units total) by mouth daily.  Dispense: 30 capsule; Refill: 12  9. Dyslipidemia On statin therapy - Lipid panel  10. Need for influenza vaccination - Flu Vaccine QUAD 36+ mos IM  11. Facet syndrome, lumbar (HCC) On PRN pain medications Followed by Pain management   Meds ordered this encounter  Medications  . mirtazapine (REMERON) 7.5 MG tablet    Sig: Take 1 tablet (7.5 mg total) by mouth at bedtime.    Dispense:  1 tablet    Refill:  0  . predniSONE (DELTASONE) 5 MG tablet    Sig: Take 1 tablet (5 mg total) by mouth daily with breakfast.    Dispense:  30 tablet    Refill:  1  . Cholecalciferol (VITAMIN D3) 2000 units capsule    Sig: Take 1 capsule (2,000 Units total) by mouth daily.    Dispense:  30 capsule    Refill:  12    Do not place this medication, or any other prescription  from our practice, on "Automatic Refill".    Partially dictated using  Editor, commissioning. Any errors are unintentional.  Halina Maidens, MD [facility]  03/19/2017

## 2017-03-19 NOTE — Patient Instructions (Signed)
Health Maintenance  Topic Date Due  . TETANUS/TDAP  07/28/1951  . DEXA SCAN  07/27/1997  . INFLUENZA VACCINE  04/14/2017 (Originally 02/12/2017)  . PNA vac Low Risk Adult  Completed

## 2017-03-20 LAB — COMPREHENSIVE METABOLIC PANEL
ALBUMIN: 4.5 g/dL (ref 3.5–4.7)
ALK PHOS: 93 IU/L (ref 39–117)
ALT: 7 IU/L (ref 0–32)
AST: 18 IU/L (ref 0–40)
Albumin/Globulin Ratio: 1.4 (ref 1.2–2.2)
BUN/Creatinine Ratio: 9 — ABNORMAL LOW (ref 12–28)
BUN: 9 mg/dL (ref 8–27)
Bilirubin Total: 0.4 mg/dL (ref 0.0–1.2)
CALCIUM: 9.4 mg/dL (ref 8.7–10.3)
CO2: 27 mmol/L (ref 20–29)
CREATININE: 1 mg/dL (ref 0.57–1.00)
Chloride: 93 mmol/L — ABNORMAL LOW (ref 96–106)
GFR calc Af Amer: 60 mL/min/{1.73_m2} (ref >59)
GFR, EST NON AFRICAN AMERICAN: 52 mL/min/{1.73_m2} — AB (ref >59)
GLUCOSE: 94 mg/dL (ref 65–99)
Globulin, Total: 3.2 g/dL (ref 1.5–4.5)
Potassium: 5.1 mmol/L (ref 3.5–5.2)
Sodium: 136 mmol/L (ref 134–144)
Total Protein: 7.7 g/dL (ref 6.0–8.5)

## 2017-03-20 LAB — CBC WITH DIFFERENTIAL/PLATELET
BASOS ABS: 0.1 10*3/uL (ref 0.0–0.2)
BASOS: 1 %
EOS (ABSOLUTE): 0.2 10*3/uL (ref 0.0–0.4)
Eos: 2 %
Hematocrit: 46.5 % (ref 34.0–46.6)
Hemoglobin: 15.4 g/dL (ref 11.1–15.9)
IMMATURE GRANS (ABS): 0 10*3/uL (ref 0.0–0.1)
Immature Granulocytes: 0 %
LYMPHS: 25 %
Lymphocytes Absolute: 2.3 10*3/uL (ref 0.7–3.1)
MCH: 30.2 pg (ref 26.6–33.0)
MCHC: 33.1 g/dL (ref 31.5–35.7)
MCV: 91 fL (ref 79–97)
MONOS ABS: 0.5 10*3/uL (ref 0.1–0.9)
Monocytes: 6 %
NEUTROS ABS: 6.3 10*3/uL (ref 1.4–7.0)
Neutrophils: 66 %
PLATELETS: 411 10*3/uL — AB (ref 150–379)
RBC: 5.1 x10E6/uL (ref 3.77–5.28)
RDW: 15.2 % (ref 12.3–15.4)
WBC: 9.5 10*3/uL (ref 3.4–10.8)

## 2017-03-20 LAB — LIPID PANEL
CHOLESTEROL TOTAL: 147 mg/dL (ref 100–199)
Chol/HDL Ratio: 2.6 ratio (ref 0.0–4.4)
HDL: 57 mg/dL (ref >39)
LDL CALC: 63 mg/dL (ref 0–99)
TRIGLYCERIDES: 135 mg/dL (ref 0–149)
VLDL CHOLESTEROL CAL: 27 mg/dL (ref 5–40)

## 2017-03-20 LAB — TSH: TSH: 1.83 u[IU]/mL (ref 0.450–4.500)

## 2017-04-22 ENCOUNTER — Encounter: Payer: Self-pay | Admitting: Nurse Practitioner

## 2017-04-22 ENCOUNTER — Ambulatory Visit: Payer: Medicare Other | Attending: Nurse Practitioner | Admitting: Nurse Practitioner

## 2017-04-22 VITALS — BP 172/62 | HR 56 | Temp 97.3°F | Resp 16 | Ht 66.0 in | Wt 109.0 lb

## 2017-04-22 DIAGNOSIS — M47816 Spondylosis without myelopathy or radiculopathy, lumbar region: Secondary | ICD-10-CM | POA: Diagnosis not present

## 2017-04-22 DIAGNOSIS — Z9889 Other specified postprocedural states: Secondary | ICD-10-CM | POA: Insufficient documentation

## 2017-04-22 DIAGNOSIS — E559 Vitamin D deficiency, unspecified: Secondary | ICD-10-CM

## 2017-04-22 DIAGNOSIS — Z86718 Personal history of other venous thrombosis and embolism: Secondary | ICD-10-CM | POA: Insufficient documentation

## 2017-04-22 DIAGNOSIS — G894 Chronic pain syndrome: Secondary | ICD-10-CM | POA: Diagnosis not present

## 2017-04-22 DIAGNOSIS — Z5181 Encounter for therapeutic drug level monitoring: Secondary | ICD-10-CM | POA: Diagnosis not present

## 2017-04-22 DIAGNOSIS — M7918 Myalgia, other site: Secondary | ICD-10-CM | POA: Diagnosis not present

## 2017-04-22 DIAGNOSIS — M5441 Lumbago with sciatica, right side: Secondary | ICD-10-CM | POA: Diagnosis not present

## 2017-04-22 DIAGNOSIS — Z90710 Acquired absence of both cervix and uterus: Secondary | ICD-10-CM | POA: Insufficient documentation

## 2017-04-22 DIAGNOSIS — G8929 Other chronic pain: Secondary | ICD-10-CM | POA: Diagnosis not present

## 2017-04-22 DIAGNOSIS — I10 Essential (primary) hypertension: Secondary | ICD-10-CM | POA: Insufficient documentation

## 2017-04-22 DIAGNOSIS — F17210 Nicotine dependence, cigarettes, uncomplicated: Secondary | ICD-10-CM | POA: Diagnosis not present

## 2017-04-22 DIAGNOSIS — Z8673 Personal history of transient ischemic attack (TIA), and cerebral infarction without residual deficits: Secondary | ICD-10-CM | POA: Diagnosis not present

## 2017-04-22 DIAGNOSIS — M79604 Pain in right leg: Secondary | ICD-10-CM

## 2017-04-22 DIAGNOSIS — J440 Chronic obstructive pulmonary disease with acute lower respiratory infection: Secondary | ICD-10-CM | POA: Diagnosis not present

## 2017-04-22 MED ORDER — HYDROCODONE-ACETAMINOPHEN 5-325 MG PO TABS: 1.0000 | ORAL_TABLET | Freq: Three times a day (TID) | ORAL | 0 refills | Status: DC | PRN

## 2017-04-22 MED ORDER — MAGNESIUM-OXIDE 400 (241.3 MG) MG PO TABS: 1.0000 | ORAL_TABLET | Freq: Every day | ORAL | 3 refills | Status: DC

## 2017-04-22 MED ORDER — VITAMIN D3 50 MCG (2000 UT) PO CAPS: ORAL_CAPSULE | ORAL | 12 refills | Status: DC

## 2017-04-22 MED ORDER — METHOCARBAMOL 750 MG PO TABS: 750.0000 mg | ORAL_TABLET | Freq: Three times a day (TID) | ORAL | 2 refills | Status: DC | PRN

## 2017-04-22 NOTE — Progress Notes (Signed)
Patient's Name: Tasha Reyes  MRN: [id number]  Referring Provider: Glean Hess, MD  DOB: 12-31-1930  PCP: Glean Hess, MD  DOS: 04/22/2017  Note by: Vevelyn Francois NP  Service setting: Ambulatory outpatient  Specialty: Interventional Pain Management  Location: [facility] (AMB) Pain Management Facility    Patient type: Established    Primary Reason(s) for Visit: Encounter for prescription drug management. (Level of risk: moderate)  CC: Back Pain (mid)  HPI  Tasha Reyes is a 83 y.o. year old, female patient, who comes today for a medication management evaluation. She has Dyslipidemia; Compulsive tobacco user syndrome; Chronic peptic ulcer with bleeding; Essential (primary) hypertension; Depression, major, recurrent, in partial remission ([place]); COPD (chronic obstructive pulmonary disease) ([place]); Vascular disorder of lower extremity; Restless leg; Chronic tension-type headache, intractable; Chronic low back pain (Location of Secondary source of pain) (Bilateral) (R>L); Chronic lower extremity pain (Location of Primary Source of Pain) (Right); Lumbosacral Radiculopathy (Right); Chronic radicular lumbar pain (Right); Long term current use of opiate analgesic; Long term prescription opiate use; Opiate use; Peripheral vascular disease ([place]); Lumbar spondylosis; Hx of long term use of blood thinners (Plavix); Lower extremity pain (Right); Chronic pain syndrome; Neurogenic pain; History of DVT (deep vein thrombosis); Lumbar facet hypertrophy; Facet syndrome, lumbar ([place]); Lumbar foraminal stenosis (Right) (Severe at L4-5); Opioid-induced constipation ([place]); Osteoporosis, post-menopausal; GERD (gastroesophageal reflux disease); Vitamin D deficiency; Nausea and vomiting; Protein-calorie malnutrition, severe; Dependence on supplemental oxygen; Edema due to malnutrition [facility]); and AAA (abdominal aortic aneurysm) without rupture ([place]) on her problem list. Her primarily concern today is the Back Pain  (mid)  Pain Assessment: Location: Mid, Lower Back Radiating: butocks/hips bilateral down back of legs to knees Onset: More than a month ago Duration: Chronic pain Quality: Constant, Aching, Discomfort, Restless Severity: 3 /10 (self-reported pain score)  Note: Reported level is compatible with observation.                   When using our objective Pain Scale, levels between 6 and 10/10 are said to belong in an emergency room, as it progressively worsens from a 6/10, described as severely limiting, requiring emergency care not usually available at an outpatient pain management facility. At a 6/10 level, communication becomes difficult and requires great effort. Assistance to reach the emergency department may be required. Facial flushing and profuse sweating along with potentially dangerous increases in heart rate and blood pressure will be evident. Effect on ADL: bend, walk long distances, squat Timing: Constant Modifying factors: medications  Tasha Reyes was last scheduled for an appointment on 01/22/2017 for medication management. During today's appointment we reviewed Tasha Reyes's chronic pain status, as well as her outpatient medication regimen. She has occasional pain in lower leg and feet. She denies any other symptoms.   The patient  reports that she does not use drugs. Her body mass index is 17.59 kg/m.  Further details on both, my assessment(s), as well as the proposed treatment plan, please see below.  Controlled Substance Pharmacotherapy Assessment REMS (Risk Evaluation and Mitigation Strategy)  Analgesic:Hydrocodone/APAP 5/325 one tablet every 8 hours (15 mg/day) MME/day:15 mg/day.   Ignatius Specking, RN  04/22/2017  1:44 PM  Sign at close encounter Nursing Pain Medication Assessment:  Safety precautions to be maintained throughout the outpatient stay will include: orient to surroundings, keep bed in low position, maintain call bell within reach at all times, provide  assistance with transfer out of bed and ambulation.  Medication Inspection Compliance: Pill count  conducted under aseptic conditions, in front of the patient. Neither the pills nor the bottle was removed from the patient's sight at any time. Once count was completed pills were immediately returned to the patient in their original bottle.  Medication: See above Pill/Patch Count: 72 of 90 pills remain Pill/Patch Appearance: Markings consistent with prescribed medication Bottle Appearance: Standard pharmacy container. Clearly labeled. Filled Date: 10 / 04 / 2018 Last Medication intake:  Today   Pharmacokinetics: Liberation and absorption (onset of action): WNL Distribution (time to peak effect): WNL Metabolism and excretion (duration of action): WNL         Pharmacodynamics: Desired effects: Analgesia: Tasha Reyes reports >50% benefit. Functional ability: Patient reports that medication allows her to accomplish basic ADLs Clinically meaningful improvement in function (CMIF): Sustained CMIF goals met Perceived effectiveness: Described as relatively effective, allowing for increase in activities of daily living (ADL) Undesirable effects: Side-effects or Adverse reactions: None reported Monitoring: [place] PMP: Online review of the past 20-monthperiod conducted. Compliant with practice rules and regulations Last UDS on record: Summary  Date Value Ref Range Status  01/22/2017 FINAL  Final    Comment:    ==================================================================== TOXASSURE SELECT 13 (MW) ==================================================================== Test                             Result       Flag       Units Drug Present and Declared for Prescription Verification   Hydrocodone                    1667         EXPECTED   ng/mg creat   Hydromorphone                  315          EXPECTED   ng/mg creat   Dihydrocodeine                 219          EXPECTED   ng/mg creat    Norhydrocodone                 >2347        EXPECTED   ng/mg creat    Sources of hydrocodone include scheduled prescription    medications. Hydromorphone, dihydrocodeine and norhydrocodone are    expected metabolites of hydrocodone. Hydromorphone and    dihydrocodeine are also available as scheduled prescription    medications. ==================================================================== Test                      Result    Flag   Units      Ref Range   Creatinine              213              mg/dL      >=20 ==================================================================== Declared Medications:  The flagging and interpretation on this report are based on the  following declared medications.  Unexpected results may arise from  inaccuracies in the declared medications.  **Note: The testing scope of this panel includes these medications:  Hydrocodone (Hydrocodone-Acetaminophen)  **Note: The testing scope of this panel does not include following  reported medications:  Acetaminophen (Hydrocodone-Acetaminophen)  Albuterol  Atenolol  Atorvastatin  Benzonatate  Cholecalciferol  Clopidogrel  Iron (Ferrous Sulfate)  Magnesium Oxide  Methocarbamol  Mirtazapine  Pantoprazole (  Protonix)  Polyethylene Glycol  Supplement ==================================================================== For clinical consultation, please call [phone]. ====================================================================    UDS interpretation: Compliant          Medication Assessment Form: Reviewed. Patient indicates being compliant with therapy Treatment compliance: Compliant Risk Assessment Profile: Aberrant behavior: See prior evaluations. None observed or detected today Comorbid factors increasing risk of overdose: See prior notes. No additional risks detected today Risk of substance use disorder (SUD): Low     Opioid Risk Tool - 04/22/17 1339      Family History of Substance  Abuse   Alcohol Negative   Illegal Drugs Negative   Rx Drugs Negative     Personal History of Substance Abuse   Alcohol Negative   Illegal Drugs Negative   Rx Drugs Negative     Age   Age between 84-45 years  No     History of Preadolescent Sexual Abuse   History of Preadolescent Sexual Abuse Negative or Female     Psychological Disease   Psychological Disease Negative   Depression Negative     Total Score   Opioid Risk Tool Scoring 0   Opioid Risk Interpretation Low Risk     ORT Scoring interpretation table:  Score <3 = Low Risk for SUD  Score between 4-7 = Moderate Risk for SUD  Score >8 = High Risk for Opioid Abuse   Risk Mitigation Strategies:  Patient Counseling: Covered Patient-Prescriber Agreement (PPA): Present and active  Notification to other healthcare providers: Done  Pharmacologic Plan: No change in therapy, at this time  Laboratory Chemistry  Inflammation Markers (CRP: Acute Phase) (ESR: Chronic Phase) Lab Results  Component Value Date   CRP 1.1 (H) 01/08/2016   ESRSEDRATE 3 01/08/2016                 Renal Function Markers Lab Results  Component Value Date   BUN 9 03/19/2017   CREATININE 1.00 03/19/2017   GFRAA 60 03/19/2017   GFRNONAA 52 (L) 03/19/2017                 Hepatic Function Markers Lab Results  Component Value Date   AST 18 03/19/2017   ALT 7 03/19/2017   ALBUMIN 4.5 03/19/2017   ALKPHOS 93 03/19/2017                 Electrolytes Lab Results  Component Value Date   NA 136 03/19/2017   K 5.1 03/19/2017   CL 93 (L) 03/19/2017   CALCIUM 9.4 03/19/2017   MG 2.0 01/08/2016                 Neuropathy Markers No results found for: [id number]               Bone Pathology Markers Lab Results  Component Value Date   ALKPHOS 93 03/19/2017   VD25OH 5.0 (L) 01/08/2016   25OHVITD1 58 04/15/2016   25OHVITD2 32 04/15/2016   25OHVITD3 26 04/15/2016   CALCIUM 9.4 03/19/2017                 Coagulation Parameters Lab  Results  Component Value Date   INR 1.1 02/21/2013   LABPROT 14.2 02/21/2013   APTT 110.0 (H) 11/15/2011   PLT 411 (H) 03/19/2017                 Cardiovascular Markers Lab Results  Component Value Date   BNP 416.0 (H) 08/19/2016   HGB 15.4 03/19/2017   HCT 46.5  03/19/2017                 Note: Lab results reviewed.  Recent Diagnostic Imaging Results  DG Chest 2 View CLINICAL DATA:  Follow-up pneumonia  EXAM: CHEST  2 VIEW  COMPARISON:  08/19/2016  FINDINGS: Cardiomediastinal silhouette is stable. Hyperinflation again noted. Improvement in aeration in right middle lobe without residual infiltrate. Minimal residual streaky scarring. No new infiltrate or pulmonary edema.  IMPRESSION: Hyperinflation again noted. Improvement in aeration in right middle lobe without residual infiltrate. Minimal residual streaky scarring. No new infiltrate or pulmonary edema.  Electronically Signed   By: Lahoma Crocker M.D.   On: 10/04/2016 15:10  Complexity Note: Imaging results reviewed. Results shared with Tasha Reyes, using [employer].                         Meds   Current Outpatient Prescriptions:  .  albuterol (PROVENTIL) (2.5 MG/3ML) 0.083% nebulizer solution, Take 3 mLs (2.5 mg total) by nebulization every 4 (four) hours as needed for wheezing or shortness of breath., Disp: 75 mL, Rfl: 12 .  atenolol (TENORMIN) 25 MG tablet, Take 1 tablet (25 mg total) by mouth 2 (two) times daily., Disp: 60 tablet, Rfl: 5 .  atorvastatin (LIPITOR) 10 MG tablet, TAKE 1 TABLET BY MOUTH EVERY NIGHT AT BEDTIME, Disp: 90 tablet, Rfl: 0 .  Cholecalciferol (VITAMIN D3) 2000 units capsule, Take 1 capsule (2,000 Units total) by mouth daily., Disp: 30 capsule, Rfl: 12 .  clopidogrel (PLAVIX) 75 MG tablet, TAKE 1 TABLET BY MOUTH DAILY, Disp: 30 tablet, Rfl: 5 .  ferrous sulfate 325 (65 FE) MG tablet, TAKE 1 TABLET(325 MG) BY MOUTH DAILY, Disp: 30 tablet, Rfl: 12 .  mirtazapine (REMERON) 15 MG tablet,  , Disp: , Rfl: 5 .  pantoprazole (PROTONIX) 40 MG tablet, TAKE 1 TABLET(40 MG) BY MOUTH TWICE DAILY, Disp: 60 tablet, Rfl: 5 .  polyethylene glycol powder (GLYCOLAX/MIRALAX) powder, MIX AND TAKE 17 GRAMS BY MOUTH prn, Disp: , Rfl: 5 .  predniSONE (DELTASONE) 5 MG tablet, Take 1 tablet (5 mg total) by mouth daily with breakfast., Disp: 30 tablet, Rfl: 1 .  [START ON 05/18/2017] HYDROcodone-acetaminophen (NORCO/VICODIN) 5-325 MG tablet, Take 1 tablet by mouth every 8 (eight) hours as needed for severe pain., Disp: 90 tablet, Rfl: 0 .  [START ON 06/17/2017] HYDROcodone-acetaminophen (NORCO/VICODIN) 5-325 MG tablet, Take 1 tablet by mouth every 8 (eight) hours as needed for severe pain., Disp: 90 tablet, Rfl: 0 .  [START ON 07/17/2017] HYDROcodone-acetaminophen (NORCO/VICODIN) 5-325 MG tablet, Take 1 tablet by mouth every 8 (eight) hours as needed for severe pain., Disp: 90 tablet, Rfl: 0 .  MAGNESIUM-OXIDE 400 (241.3 Mg) MG tablet, Take 1 tablet (400 mg total) by mouth daily. Reported on 11/07/2015, Disp: 90 tablet, Rfl: 3 .  methocarbamol (ROBAXIN) 750 MG tablet, Take 1 tablet (750 mg total) by mouth 3 (three) times daily as needed for muscle spasms., Disp: 90 tablet, Rfl: 2  ROS  Constitutional: Denies any fever or chills Gastrointestinal: No reported hemesis, hematochezia, vomiting, or acute GI distress Musculoskeletal: Denies any acute onset joint swelling, redness, loss of ROM, or weakness Neurological: No reported episodes of acute onset apraxia, aphasia, dysarthria, agnosia, amnesia, paralysis, loss of coordination, or loss of consciousness  Allergies  Tasha Reyes has No Known Allergies.  PFSH  Drug: Tasha Reyes  reports that she does not use drugs. Alcohol:  reports that she does not drink alcohol.  Tobacco:  reports that she has been smoking Cigarettes.  She has a 30.50 pack-year smoking history. She has never used smokeless tobacco. Medical:  has a past medical history of Aneurysm of  iliac artery ([place]) (05/11/2015); COPD (chronic obstructive pulmonary disease) ([place]); Cystitis; History of DVT (deep vein thrombosis) (05/11/2015); History of peptic ulcer disease (05/11/2015); [name]; Hypertension; Losing weight; Migraines; [name]; Stroke [facility]); and Vascular disease. Surgical: Tasha Reyes  has a past surgical history that includes Partial hysterectomy; [name]; [name], 2008); Back surgery; Colonoscopy (2012); Esophagogastroduodenoscopy (04/2013); Abdominal hysterectomy; Appendectomy; and blood clot . Family: family history includes Brain cancer in her father; Kidney disease in her mother.  Constitutional Exam  General appearance: Well nourished, well developed, and well hydrated. In no apparent acute distress Vitals:   04/22/17 1333  BP: (!) 172/62  Pulse: (!) 56  Resp: 16  Temp: (!) 97.3 F (36.3 C)  SpO2: 95%  Weight: 109 lb (49.4 kg)  Height: '5\' 6"'  (1.676 m)   BMI Assessment: Estimated body mass index is 17.59 kg/m as calculated from the following:   Height as of this encounter: '5\' 6"'  (1.676 m).   Weight as of this encounter: 109 lb (49.4 kg). Psych/Mental status: Alert, oriented x 3 (person, place, & time)       Eyes: PERLA Respiratory: No evidence of acute respiratory distress  Cervical Spine Area Exam  Skin & Axial Inspection: No masses, redness, edema, swelling, or associated skin lesions Alignment: Symmetrical Functional ROM: Unrestricted ROM      Stability: No instability detected Muscle Tone/Strength: Functionally intact. No obvious neuro-muscular anomalies detected. Sensory (Neurological): Unimpaired Palpation: No palpable anomalies              Upper Extremity (UE) Exam    Side: Right upper extremity  Side: Left upper extremity  Skin & Extremity Inspection: Skin color, temperature, and hair growth are WNL. No peripheral edema or  cyanosis. No masses, redness, swelling, asymmetry, or associated skin lesions. No contractures.  Skin & Extremity Inspection: Skin color, temperature, and hair growth are WNL. No peripheral edema or cyanosis. No masses, redness, swelling, asymmetry, or associated skin lesions. No contractures.  Functional ROM: Unrestricted ROM          Functional ROM: Unrestricted ROM          Muscle Tone/Strength: Functionally intact. No obvious neuro-muscular anomalies detected.  Muscle Tone/Strength: Functionally intact. No obvious neuro-muscular anomalies detected.  Sensory (Neurological): Unimpaired          Sensory (Neurological): Unimpaired          Palpation: No palpable anomalies              Palpation: No palpable anomalies              Specialized Test(s): Deferred         Specialized Test(s): Deferred          Thoracic Spine Area Exam  Skin & Axial Inspection: No masses, redness, or swelling Alignment: Symmetrical Functional ROM: Unrestricted ROM Stability: No instability detected Muscle Tone/Strength: Functionally intact. No obvious neuro-muscular anomalies detected. Sensory (Neurological): Unimpaired Muscle strength & Tone: No palpable anomalies  Lumbar Spine Area Exam  Skin & Axial Inspection: No masses, redness, or swelling Alignment: Symmetrical Functional ROM: Unrestricted ROM      Stability: No instability detected Muscle Tone/Strength: Functionally intact. No obvious neuro-muscular anomalies detected. Sensory (Neurological): Unimpaired Palpation: No palpable anomalies  Provocative Tests: Lumbar Hyperextension and rotation test: evaluation deferred today       Lumbar Lateral bending test: evaluation deferred today       Patrick's Maneuver: evaluation deferred today                    Gait & Posture Assessment  Ambulation: Unassisted Gait: Relatively normal for age and body habitus Posture: WNL   Lower Extremity Exam    Side: Right lower extremity  Side: Left lower extremity   Skin & Extremity Inspection: Skin color, temperature, and hair growth are WNL. No peripheral edema or cyanosis. No masses, redness, swelling, asymmetry, or associated skin lesions. No contractures.  Skin & Extremity Inspection: Skin color, temperature, and hair growth are WNL. No peripheral edema or cyanosis. No masses, redness, swelling, asymmetry, or associated skin lesions. No contractures.  Functional ROM: Unrestricted ROM          Functional ROM: Unrestricted ROM          Muscle Tone/Strength: Functionally intact. No obvious neuro-muscular anomalies detected.  Muscle Tone/Strength: Functionally intact. No obvious neuro-muscular anomalies detected.  Sensory (Neurological): Unimpaired  Sensory (Neurological): Unimpaired  Palpation: No palpable anomalies  Palpation: No palpable anomalies   Assessment  Primary Diagnosis & Pertinent Problem List: The primary encounter diagnosis was Chronic low back pain (Location of Secondary source of pain) (Bilateral) (R>L). Diagnoses of Chronic lower extremity pain (Location of Primary Source of Pain) (Right), Lumbar spondylosis, Chronic pain syndrome, Musculoskeletal pain, and Vitamin D deficiency were also pertinent to this visit.  Status Diagnosis  Controlled Controlled Controlled 1. Chronic low back pain (Location of Secondary source of pain) (Bilateral) (R>L)   2. Chronic lower extremity pain (Location of Primary Source of Pain) (Right)   3. Lumbar spondylosis   4. Chronic pain syndrome   5. Musculoskeletal pain   6. Vitamin D deficiency     Problems updated and reviewed during this visit: No problems updated. Plan of Care  Pharmacotherapy (Medications Ordered): Meds ordered this encounter  Medications  . HYDROcodone-acetaminophen (NORCO/VICODIN) 5-325 MG tablet    Sig: Take 1 tablet by mouth every 8 (eight) hours as needed for severe pain.    Dispense:  90 tablet    Refill:  0    Do not place this medication, or any other prescription from  our practice, on "Automatic Refill". Patient may have prescription filled one day early if pharmacy is closed on scheduled refill date. Do not fill until:05/18/2017 To last until: 06/17/2017    Order Specific Question:   Supervising Provider    Answer:   Milinda Pointer [phone]  . methocarbamol (ROBAXIN) 750 MG tablet    Sig: Take 1 tablet (750 mg total) by mouth 3 (three) times daily as needed for muscle spasms.    Dispense:  90 tablet    Refill:  2    Do not add this medication to the electronic "Automatic Refill" notification system. Patient may have prescription filled one day early if pharmacy is closed on scheduled refill date.    Order Specific Question:   Supervising Provider    Answer:   Milinda Pointer [phone]  . Cholecalciferol (VITAMIN D3) 2000 units capsule    Sig: Take 1 capsule (2,000 Units total) by mouth daily.    Dispense:  30 capsule    Refill:  12    Do not place this medication, or any other prescription from our practice, on "Automatic Refill".    Order Specific Question:  Supervising Provider    Answer:   Milinda Pointer [phone]  . MAGNESIUM-OXIDE 400 (241.3 Mg) MG tablet    Sig: Take 1 tablet (400 mg total) by mouth daily. Reported on 11/07/2015    Dispense:  90 tablet    Refill:  3    Do not add this medication to the electronic "Automatic Refill" notification system. Patient may have prescription filled one day early if pharmacy is closed on scheduled refill date.    Order Specific Question:   Supervising Provider    Answer:   Milinda Pointer [phone]  . HYDROcodone-acetaminophen (NORCO/VICODIN) 5-325 MG tablet    Sig: Take 1 tablet by mouth every 8 (eight) hours as needed for severe pain.    Dispense:  90 tablet    Refill:  0    Do not place this medication, or any other prescription from our practice, on "Automatic Refill". Patient may have prescription filled one day early if pharmacy is closed on scheduled refill date. Do not fill  until:06/17/2017 To last until: 07/17/2017    Order Specific Question:   Supervising Provider    Answer:   Milinda Pointer [phone]  . HYDROcodone-acetaminophen (NORCO/VICODIN) 5-325 MG tablet    Sig: Take 1 tablet by mouth every 8 (eight) hours as needed for severe pain.    Dispense:  90 tablet    Refill:  0    Do not place this medication, or any other prescription from our practice, on "Automatic Refill". Patient may have prescription filled one day early if pharmacy is closed on scheduled refill date. Do not fill until: 07/17/2017 To last until: 08/16/2017    Order Specific Question:   Supervising Provider    Answer:   Milinda Pointer [phone]   New Prescriptions   No medications on file   Medications administered today: Tasha Reyes had no medications administered during this visit. Lab-work, procedure(s), and/or referral(s): No orders of the defined types were placed in this encounter.  Imaging and/or referral(s): None  Interventional therapies: Planned, scheduled, and/or pending:  Not at this time.   Considering:  Diagnostic right L4-5 transforaminal epidural steroid injection  Diagnostic right L4-5 lumbar epidural steroid injection  Diagnostic bilateral lumbar facet block  Possible bilateral lumbar facet radiofrequency ablation    Palliative PRN treatment(s):  Diagnostic right L4-5 transforaminal epidural steroid injection Diagnostic right L4-5 lumbar epidural steroid injection  Diagnostic bilateral lumbar facet block    Provider-requested follow-up: Return in about 3 months (around 07/23/2017) for MedMgmt.  Future Appointments Date Time Provider [place]  04/30/2017 2:45 PM Glean Hess, MD [facility] None  07/23/2017 1:45 PM Vevelyn Francois, NP [facility] None  03/20/2018 9:30 AM Glean Hess, MD [facility] None  03/20/2018 10:00 AM [facility] None  03/20/2018 10:30 AM [facility] None  03/20/2018 11:30 AM Dew, Erskine Squibb, MD [facility]  None   Primary Care Physician: Glean Hess, MD Location: [facility] Note by: Vevelyn Francois NP Date: 04/22/2017; Time: 2:41 PM  Pain Score Disclaimer: We use the NRS-11 scale. This is a self-reported, subjective measurement of pain severity with only modest accuracy. It is used primarily to identify changes within a particular patient. It must be understood that outpatient pain scales are significantly less accurate that those used for research, where they can be applied under ideal controlled circumstances with minimal exposure to variables. In reality, the score is likely to be a combination of pain intensity and pain affect,  where pain affect describes the degree of emotional arousal or changes in action readiness caused by the sensory experience of pain. Factors such as social and work situation, setting, emotional state, anxiety levels, expectation, and prior pain experience may influence pain perception and show large inter-individual differences that may also be affected by time variables.  Patient instructions provided during this appointment: Patient Instructions    ____________________________________________________________________________________________  Medication Rules  Applies to: All patients receiving prescriptions (written or electronic).  Pharmacy of record: Pharmacy where electronic prescriptions will be sent. If written prescriptions are taken to a different pharmacy, please inform the nursing staff. The pharmacy listed in the electronic medical record should be the one where you would like electronic prescriptions to be sent.  Prescription refills: Only during scheduled appointments. Applies to both, written and electronic prescriptions.  NOTE: The following applies primarily to controlled substances (Opioid* Pain Medications).   Patient's responsibilities: 1. Pain Pills: Bring all pain pills to every appointment (except for  procedure appointments). 2. Pill Bottles: Bring pills in original pharmacy bottle. Always bring newest bottle. Bring bottle, even if empty. 3. Medication refills: You are responsible for knowing and keeping track of what medications you need refilled. The day before your appointment, write a list of all prescriptions that need to be refilled. Bring that list to your appointment and give it to the admitting nurse. Prescriptions will be written only during appointments. If you forget a medication, it will not be "Called in", "Faxed", or "electronically sent". You will need to get another appointment to get these prescribed. 4. Prescription Accuracy: You are responsible for carefully inspecting your prescriptions before leaving our office. Have the discharge nurse carefully go over each prescription with you, before taking them home. Make sure that your name is accurately spelled, that your address is correct. Check the name and dose of your medication to make sure it is accurate. Check the number of pills, and the written instructions to make sure they are clear and accurate. Make sure that you are given enough medication to last until your next medication refill appointment. 5. Taking Medication: Take medication as prescribed. Never take more pills than instructed. Never take medication more frequently than prescribed. Taking less pills or less frequently is permitted and encouraged, when it comes to controlled substances (written prescriptions).  6. Inform other Doctors: Always inform, all of your healthcare providers, of all the medications you take. 7. Pain Medication from other Providers: You are not allowed to accept any additional pain medication from any other Doctor or Healthcare provider. There are two exceptions to this rule. (see below) In the event that you require additional pain medication, you are responsible for notifying us, as stated below. 8. Medication Agreement: You are responsible for  carefully reading and following our Medication Agreement. This must be signed before receiving any prescriptions from our practice. Safely store a copy of your signed Agreement. Violations to the Agreement will result in no further prescriptions. (Additional copies of our Medication Agreement are available upon request.) 9. Laws, Rules, & Regulations: All patients are expected to follow all Federal and [employer], [employer], Rules, [employer]. Ignorance of the Laws does not constitute a valid excuse. The use of any illegal substances is prohibited. 10. Adopted CDC guidelines & recommendations: Target dosing levels will be at or below 60 MME/day. Use of benzodiazepines** is not recommended.  Exceptions: There are only two exceptions to the rule of not receiving pain medications from other Healthcare Providers. 1. Exception #  1 (Emergencies): In the event of an emergency (i.e.: accident requiring emergency care), you are allowed to receive additional pain medication. However, you are responsible for: As soon as you are able, call our office [phone], at any time of the day or night, and leave a message stating your name, the date and nature of the emergency, and the name and dose of the medication prescribed. In the event that your call is answered by a member of our staff, make sure to document and save the date, time, and the name of the person that took your information.  2. Exception #2 (Planned Surgery): In the event that you are scheduled by another doctor or dentist to have any type of surgery or procedure, you are allowed (for a period no longer than 30 days), to receive additional pain medication, for the acute post-op pain. However, in this case, you are responsible for picking up a copy of our "Post-op Pain Management for Surgeons" handout, and giving it to your surgeon or dentist. This document is available at our office, and does not require an appointment to obtain it. Simply go to our  office during business hours (Monday-Thursday from 8:00 AM to 4:00 PM) (Friday 8:00 AM to 12:00 Noon) or if you have a scheduled appointment with [place], prior to your surgery, and ask for it by name. In addition, you will need to provide [place] with your name, name of your surgeon, type of surgery, and date of procedure or surgery.  *Opioid medications include: morphine, codeine, oxycodone, oxymorphone, hydrocodone, hydromorphone, meperidine, tramadol, tapentadol, buprenorphine, fentanyl, methadone. **Benzodiazepine medications include: diazepam (Valium), alprazolam (Xanax), clonazepam (Klonopine), lorazepam (Ativan), clorazepate (Tranxene), chlordiazepoxide (Librium), estazolam (Prosom), oxazepam (Serax), temazepam (Restoril), triazolam (Halcion)  ____________________________________________________________________________________________ BMI Assessment: Estimated body mass index is 17.59 kg/m as calculated from the following:   Height as of this encounter: '5\' 6"'  (1.676 m).   Weight as of this encounter: 109 lb (49.4 kg).  BMI interpretation table: BMI level Category Range association with higher incidence of chronic pain  <18 kg/m2 Underweight   18.5-24.9 kg/m2 Ideal body weight   25-29.9 kg/m2 Overweight Increased incidence by 20%  30-34.9 kg/m2 Obese (Class I) Increased incidence by 68%  35-39.9 kg/m2 Severe obesity (Class II) Increased incidence by 136%  >40 kg/m2 Extreme obesity (Class III) Increased incidence by 254%   BMI Readings from Last 4 Encounters:  04/22/17 17.59 kg/m  03/19/17 17.59 kg/m  03/18/17 17.59 kg/m  01/22/17 18.24 kg/m   Wt Readings from Last 4 Encounters:  04/22/17 109 lb (49.4 kg)  03/19/17 109 lb (49.4 kg)  03/18/17 109 lb (49.4 kg)  01/22/17 113 lb (51.3 kg)

## 2017-04-22 NOTE — Progress Notes (Signed)
Nursing Pain Medication Assessment:  Safety precautions to be maintained throughout the outpatient stay will include: orient to surroundings, keep bed in low position, maintain call bell within reach at all times, provide assistance with transfer out of bed and ambulation.  Medication Inspection Compliance: Pill count conducted under aseptic conditions, in front of the patient. Neither the pills nor the bottle was removed from the patient's sight at any time. Once count was completed pills were immediately returned to the patient in their original bottle.  Medication: See above Pill/Patch Count: 72 of 90 pills remain Pill/Patch Appearance: Markings consistent with prescribed medication Bottle Appearance: Standard pharmacy container. Clearly labeled. Filled Date: 10 / 04 / 2018 Last Medication intake:  Today

## 2017-04-22 NOTE — Patient Instructions (Addendum)
____________________________________________________________________________________________  Medication Rules  Applies to: All patients receiving prescriptions (written or electronic).  Pharmacy of record: Pharmacy where electronic prescriptions will be sent. If written prescriptions are taken to a different pharmacy, please inform the nursing staff. The pharmacy listed in the electronic medical record should be the one where you would like electronic prescriptions to be sent.  Prescription refills: Only during scheduled appointments. Applies to both, written and electronic prescriptions.  NOTE: The following applies primarily to controlled substances (Opioid* Pain Medications).   Patient's responsibilities: 1. Pain Pills: Bring all pain pills to every appointment (except for procedure appointments). 2. Pill Bottles: Bring pills in original pharmacy bottle. Always bring newest bottle. Bring bottle, even if empty. 3. Medication refills: You are responsible for knowing and keeping track of what medications you need refilled. The day before your appointment, write a list of all prescriptions that need to be refilled. Bring that list to your appointment and give it to the admitting nurse. Prescriptions will be written only during appointments. If you forget a medication, it will not be "Called in", "Faxed", or "electronically sent". You will need to get another appointment to get these prescribed. 4. Prescription Accuracy: You are responsible for carefully inspecting your prescriptions before leaving our office. Have the discharge nurse carefully go over each prescription with you, before taking them home. Make sure that your name is accurately spelled, that your address is correct. Check the name and dose of your medication to make sure it is accurate. Check the number of pills, and the written instructions to make sure they are clear and accurate. Make sure that you are given enough medication to  last until your next medication refill appointment. 5. Taking Medication: Take medication as prescribed. Never take more pills than instructed. Never take medication more frequently than prescribed. Taking less pills or less frequently is permitted and encouraged, when it comes to controlled substances (written prescriptions).  6. Inform other Doctors: Always inform, all of your healthcare providers, of all the medications you take. 7. Pain Medication from other Providers: You are not allowed to accept any additional pain medication from any other Doctor or Healthcare provider. There are two exceptions to this rule. (see below) In the event that you require additional pain medication, you are responsible for notifying us, as stated below. 8. Medication Agreement: You are responsible for carefully reading and following our Medication Agreement. This must be signed before receiving any prescriptions from our practice. Safely store a copy of your signed Agreement. Violations to the Agreement will result in no further prescriptions. (Additional copies of our Medication Agreement are available upon request.) 9. Laws, Rules, & Regulations: All patients are expected to follow all Federal and State Laws, Statutes, Rules, & Regulations. Ignorance of the Laws does not constitute a valid excuse. The use of any illegal substances is prohibited. 10. Adopted CDC guidelines & recommendations: Target dosing levels will be at or below 60 MME/day. Use of benzodiazepines** is not recommended.  Exceptions: There are only two exceptions to the rule of not receiving pain medications from other Healthcare Providers. 1. Exception #1 (Emergencies): In the event of an emergency (i.e.: accident requiring emergency care), you are allowed to receive additional pain medication. However, you are responsible for: As soon as you are able, call our office [phone], at any time of the day or night, and leave a message stating your  name, the date and nature of the emergency, and the name and dose of the medication   prescribed. In the event that your call is answered by a member of our staff, make sure to document and save the date, time, and the name of the person that took your information.  2. Exception #2 (Planned Surgery): In the event that you are scheduled by another doctor or dentist to have any type of surgery or procedure, you are allowed (for a period no longer than 30 days), to receive additional pain medication, for the acute post-op pain. However, in this case, you are responsible for picking up a copy of our "Post-op Pain Management for Surgeons" handout, and giving it to your surgeon or dentist. This document is available at our office, and does not require an appointment to obtain it. Simply go to our office during business hours (Monday-Thursday from 8:00 AM to 4:00 PM) (Friday 8:00 AM to 12:00 Noon) or if you have a scheduled appointment with [place], prior to your surgery, and ask for it by name. In addition, you will need to provide [place] with your name, name of your surgeon, type of surgery, and date of procedure or surgery.  *Opioid medications include: morphine, codeine, oxycodone, oxymorphone, hydrocodone, hydromorphone, meperidine, tramadol, tapentadol, buprenorphine, fentanyl, methadone. **Benzodiazepine medications include: diazepam (Valium), alprazolam (Xanax), clonazepam (Klonopine), lorazepam (Ativan), clorazepate (Tranxene), chlordiazepoxide (Librium), estazolam (Prosom), oxazepam (Serax), temazepam (Restoril), triazolam (Halcion)  ____________________________________________________________________________________________ BMI Assessment: Estimated body mass index is 17.59 kg/m as calculated from the following:   Height as of this encounter: 5\' 6"  (1.676 m).   Weight as of this encounter: 109 lb (49.4 kg).  BMI interpretation table: BMI level Category Range association with higher incidence of chronic pain   <18 kg/m2 Underweight   18.5-24.9 kg/m2 Ideal body weight   25-29.9 kg/m2 Overweight Increased incidence by 20%  30-34.9 kg/m2 Obese (Class I) Increased incidence by 68%  35-39.9 kg/m2 Severe obesity (Class II) Increased incidence by 136%  >40 kg/m2 Extreme obesity (Class III) Increased incidence by 254%   BMI Readings from Last 4 Encounters:  04/22/17 17.59 kg/m  03/19/17 17.59 kg/m  03/18/17 17.59 kg/m  01/22/17 18.24 kg/m   Wt Readings from Last 4 Encounters:  04/22/17 109 lb (49.4 kg)  03/19/17 109 lb (49.4 kg)  03/18/17 109 lb (49.4 kg)  01/22/17 113 lb (51.3 kg)

## 2017-04-30 ENCOUNTER — Ambulatory Visit (INDEPENDENT_AMBULATORY_CARE_PROVIDER_SITE_OTHER): Payer: Medicare Other | Admitting: Internal Medicine

## 2017-04-30 ENCOUNTER — Encounter: Payer: Self-pay | Admitting: Internal Medicine

## 2017-04-30 VITALS — BP 126/80 | HR 60 | Ht 66.0 in | Wt 108.2 lb

## 2017-04-30 DIAGNOSIS — I10 Essential (primary) hypertension: Secondary | ICD-10-CM | POA: Diagnosis not present

## 2017-04-30 DIAGNOSIS — J449 Chronic obstructive pulmonary disease, unspecified: Secondary | ICD-10-CM

## 2017-04-30 DIAGNOSIS — E43 Unspecified severe protein-calorie malnutrition: Secondary | ICD-10-CM | POA: Diagnosis not present

## 2017-04-30 DIAGNOSIS — K219 Gastro-esophageal reflux disease without esophagitis: Secondary | ICD-10-CM

## 2017-04-30 MED ORDER — PREDNISONE 5 MG PO TABS: 5.0000 mg | ORAL_TABLET | Freq: Every day | ORAL | 5 refills | Status: DC

## 2017-04-30 NOTE — Progress Notes (Signed)
Date:  04/30/2017   Name:  Tasha Reyes   DOB:  08/01/1930   MRN:  [id number]   Chief Complaint: COPD; Hypertension; Gastroesophageal Reflux; and Depression  Hypertension  This is a chronic problem. The problem is unchanged. The problem is controlled. Associated symptoms include shortness of breath. Pertinent negatives include no chest pain or palpitations. Past treatments include beta blockers.  Depression         This is a chronic problem.  The problem occurs rarely.The problem is unchanged.  Associated symptoms include no fatigue and no suicidal ideas.  Past treatments include other medications.  Compliance with treatment is good. Gastroesophageal Reflux  She reports no abdominal pain, no chest pain, no coughing or no wheezing. This is a recurrent problem. The problem occurs rarely. Pertinent negatives include no fatigue. She has tried a PPI for the symptoms.  COPD - cutting back on smoking 3 per day now. She smokes more if she gets upset or mad. She thinks that the prednisone has helped her breathing significantly.   Review of Systems  Constitutional: Negative for chills, fatigue and fever.  Respiratory: Positive for shortness of breath. Negative for cough, chest tightness and wheezing.   Cardiovascular: Negative for chest pain, palpitations and leg swelling.  Gastrointestinal: Negative for abdominal pain.  Musculoskeletal: Negative for arthralgias.  Psychiatric/Behavioral: Positive for depression. Negative for dysphoric mood, hallucinations, sleep disturbance and suicidal ideas.    Patient Active Problem List   Diagnosis Date Noted  . AAA (abdominal aortic aneurysm) without rupture ([place]) 03/18/2017  . Edema due to malnutrition ([place]) 10/28/2016  . Dependence on supplemental oxygen 09/04/2016  . Protein-calorie malnutrition, severe 08/20/2016  . Nausea and vomiting 07/30/2016  . Vitamin D deficiency 01/10/2016  . Opioid-induced constipation ([facility]) 11/07/2015  .  Osteoporosis, post-menopausal 11/07/2015  . GERD (gastroesophageal reflux disease) 11/07/2015  . Lumbar foraminal stenosis (Right) (Severe at L4-5) 06/20/2015  . Chronic low back pain (Location of Secondary source of pain) (Bilateral) (R>L) 05/11/2015  . Chronic lower extremity pain (Location of Primary Source of Pain) (Right) 05/11/2015  . Lumbosacral Radiculopathy (Right) 05/11/2015  . Chronic radicular lumbar pain (Right) 05/11/2015  . Long term current use of opiate analgesic 05/11/2015  . Long term prescription opiate use 05/11/2015  . Opiate use 05/11/2015  . Peripheral vascular disease ([place]) 05/11/2015  . Lumbar spondylosis 05/11/2015  . Hx of long term use of blood thinners (Plavix) 05/11/2015  . Lower extremity pain (Right) 05/11/2015  . Chronic pain syndrome 05/11/2015  . Neurogenic pain 05/11/2015  . History of DVT (deep vein thrombosis) 05/11/2015  . Lumbar facet hypertrophy 05/11/2015  . Facet syndrome, lumbar ([place]) 05/11/2015  . Dyslipidemia 12/23/2014  . Compulsive tobacco user syndrome 12/23/2014  . Chronic peptic ulcer with bleeding 12/23/2014  . Essential (primary) hypertension 12/23/2014  . Depression, major, recurrent, in partial remission ([place]) 12/23/2014  . COPD (chronic obstructive pulmonary disease) ([place]) 12/23/2014  . Vascular disorder of lower extremity 12/23/2014  . Restless leg 12/23/2014  . Chronic tension-type headache, intractable 12/23/2014    Prior to Admission medications   Medication Sig Start Date End Date Taking? Authorizing Provider  albuterol (PROVENTIL) (2.5 MG/3ML) 0.083% nebulizer solution Take 3 mLs (2.5 mg total) by nebulization every 4 (four) hours as needed for wheezing or shortness of breath. 08/21/16  Yes Fritzi Mandes, MD  atenolol (TENORMIN) 25 MG tablet Take 1 tablet (25 mg total) by mouth 2 (two) times daily. 10/28/16  Yes Glean Hess, MD  atorvastatin (LIPITOR) 10 MG tablet TAKE 1 TABLET BY MOUTH EVERY NIGHT AT BEDTIME 03/02/17   Yes Glean Hess, MD  Cholecalciferol (VITAMIN D3) 2000 units capsule Take 1 capsule (2,000 Units total) by mouth daily. 04/22/17  Yes Vevelyn Francois, NP  clopidogrel (PLAVIX) 75 MG tablet TAKE 1 TABLET BY MOUTH DAILY 02/26/17  Yes Dew, Erskine Squibb, MD  ferrous sulfate 325 (65 FE) MG tablet TAKE 1 TABLET(325 MG) BY MOUTH DAILY 01/01/17  Yes Glean Hess, MD  HYDROcodone-acetaminophen (NORCO/VICODIN) 5-325 MG tablet Take 1 tablet by mouth every 8 (eight) hours as needed for severe pain. 07/17/17 08/16/17 Yes King, Diona Foley, NP  MAGNESIUM-OXIDE 400 (241.3 Mg) MG tablet Take 1 tablet (400 mg total) by mouth daily. Reported on 11/07/2015 04/22/17 07/21/17 Yes Vevelyn Francois, NP  methocarbamol (ROBAXIN) 750 MG tablet Take 1 tablet (750 mg total) by mouth 3 (three) times daily as needed for muscle spasms. 04/22/17 07/21/17 Yes Vevelyn Francois, NP  mirtazapine (REMERON) 15 MG tablet  04/15/17  Yes [provider]  pantoprazole (PROTONIX) 40 MG tablet TAKE 1 TABLET(40 MG) BY MOUTH TWICE DAILY 11/06/16  Yes Glean Hess, MD  polyethylene glycol powder (GLYCOLAX/MIRALAX) powder MIX AND TAKE 17 GRAMS BY MOUTH prn 01/30/16  Yes [provider]  predniSONE (DELTASONE) 5 MG tablet Take 1 tablet (5 mg total) by mouth daily with breakfast. 03/19/17  Yes Glean Hess, MD    No Known Allergies  Past Surgical History:  Procedure Laterality Date  . ABDOMINAL AORTIC ANEURYSM REPAIR W/ ENDOLUMINAL GRAFT  2008  . ABDOMINAL HYSTERECTOMY    . APPENDECTOMY    . BACK SURGERY    . blood clot      removal right leg  . COLONOSCOPY  2012  . ESOPHAGOGASTRODUODENOSCOPY  04/2013   gastric ulcers  . FEMORAL-POPLITEAL BYPASS GRAFT Right 2008  . PARTIAL HYSTERECTOMY      Social History  Substance Use Topics  . Smoking status: Current Every Day Smoker    Packs/day: 0.25    Years: 61.00    Types: Cigarettes  . Smokeless tobacco: Never Used     Comment: 3 cigrettes a day- cut back  . Alcohol use No       Medication list has been reviewed and updated.  PHQ 2/9 Scores 04/30/2017 04/22/2017 03/19/2017 01/22/2017  PHQ - 2 Score 0 0 0 0  PHQ- 9 Score 0 - 0 -    Physical Exam  Constitutional: She is oriented to person, place, and time. She appears well-developed. She appears cachectic. No distress.  HENT:  Head: Normocephalic and atraumatic.  Neck: Carotid bruit is not present.  Cardiovascular: Normal rate, regular rhythm and normal heart sounds.   Pulmonary/Chest: Effort normal. No respiratory distress. She has decreased breath sounds. She has no wheezes. She has no rhonchi.  Musculoskeletal: Normal range of motion.  Neurological: She is alert and oriented to person, place, and time.  Skin: Skin is warm and dry. No rash noted.  Psychiatric: She has a normal mood and affect. Her behavior is normal. Thought content normal.  Nursing note and vitals reviewed.   BP 126/80   Pulse 60   Ht 5\' 6"  (1.676 m)   Wt 108 lb 3.2 oz (49.1 kg)   SpO2 91%   BMI 17.46 kg/m   Assessment and Plan: 1. Chronic obstructive pulmonary disease, unspecified COPD type ([place]) Slightly improved - predniSONE (DELTASONE) 5 MG tablet; Take 1 tablet (5 mg total) by  mouth daily with breakfast.  Dispense: 30 tablet; Refill: 5  2. Essential (primary) hypertension Controlled Check BS and renal function - Basic metabolic panel  3. Gastroesophageal reflux disease without esophagitis Controlled on PPI  4. Protein-calorie malnutrition, severe Weight essentially stable Continue to monitor  Meds ordered this encounter  Medications  . predniSONE (DELTASONE) 5 MG tablet    Sig: Take 1 tablet (5 mg total) by mouth daily with breakfast.    Dispense:  30 tablet    Refill:  5    Partially dictated using Editor, commissioning. Any errors are unintentional.  Halina Maidens, MD [facility]  04/30/2017

## 2017-05-01 LAB — BASIC METABOLIC PANEL
BUN / CREAT RATIO: 8 — AB (ref 12–28)
BUN: 9 mg/dL (ref 8–27)
CO2: 27 mmol/L (ref 20–29)
CREATININE: 1.1 mg/dL — AB (ref 0.57–1.00)
Calcium: 9.6 mg/dL (ref 8.7–10.3)
Chloride: 96 mmol/L (ref 96–106)
GFR calc Af Amer: 53 mL/min/{1.73_m2} — ABNORMAL LOW (ref >59)
GFR, EST NON AFRICAN AMERICAN: 46 mL/min/{1.73_m2} — AB (ref >59)
Glucose: 100 mg/dL — ABNORMAL HIGH (ref 65–99)
Potassium: 4 mmol/L (ref 3.5–5.2)
SODIUM: 138 mmol/L (ref 134–144)

## 2017-05-02 ENCOUNTER — Telehealth: Payer: Self-pay

## 2017-05-02 NOTE — Telephone Encounter (Signed)
Patient informed of labs- told to drink more fluids throughout day and continue 5mg  of prednisone.

## 2017-06-05 ENCOUNTER — Other Ambulatory Visit: Payer: Self-pay | Admitting: Internal Medicine

## 2017-06-16 ENCOUNTER — Other Ambulatory Visit: Payer: Self-pay

## 2017-06-16 ENCOUNTER — Inpatient Hospital Stay
Admission: EM | Admit: 2017-06-16 | Discharge: 2017-06-21 | DRG: 470 | Disposition: A | Discharge status: Skilled Nursing Facility | Payer: Medicare Other | Attending: Internal Medicine | Admitting: Internal Medicine

## 2017-06-16 ENCOUNTER — Emergency Department: Payer: Medicare Other

## 2017-06-16 DIAGNOSIS — B961 Klebsiella pneumoniae [K. pneumoniae] as the cause of diseases classified elsewhere: Secondary | ICD-10-CM | POA: Diagnosis present

## 2017-06-16 DIAGNOSIS — R0600 Dyspnea, unspecified: Secondary | ICD-10-CM | POA: Diagnosis not present

## 2017-06-16 DIAGNOSIS — I739 Peripheral vascular disease, unspecified: Secondary | ICD-10-CM | POA: Diagnosis present

## 2017-06-16 DIAGNOSIS — Z9911 Dependence on respirator [ventilator] status: Secondary | ICD-10-CM | POA: Diagnosis not present

## 2017-06-16 DIAGNOSIS — Z79899 Other long term (current) drug therapy: Secondary | ICD-10-CM

## 2017-06-16 DIAGNOSIS — Z7902 Long term (current) use of antithrombotics/antiplatelets: Secondary | ICD-10-CM | POA: Diagnosis not present

## 2017-06-16 DIAGNOSIS — E785 Hyperlipidemia, unspecified: Secondary | ICD-10-CM | POA: Diagnosis present

## 2017-06-16 DIAGNOSIS — E7800 Pure hypercholesterolemia, unspecified: Secondary | ICD-10-CM | POA: Diagnosis not present

## 2017-06-16 DIAGNOSIS — Z7952 Long term (current) use of systemic steroids: Secondary | ICD-10-CM | POA: Diagnosis not present

## 2017-06-16 DIAGNOSIS — M84459D Pathological fracture, hip, unspecified, subsequent encounter for fracture with routine healing: Secondary | ICD-10-CM | POA: Diagnosis not present

## 2017-06-16 DIAGNOSIS — R110 Nausea: Secondary | ICD-10-CM | POA: Diagnosis not present

## 2017-06-16 DIAGNOSIS — I10 Essential (primary) hypertension: Secondary | ICD-10-CM | POA: Diagnosis present

## 2017-06-16 DIAGNOSIS — N390 Urinary tract infection, site not specified: Secondary | ICD-10-CM | POA: Diagnosis present

## 2017-06-16 DIAGNOSIS — K219 Gastro-esophageal reflux disease without esophagitis: Secondary | ICD-10-CM | POA: Diagnosis present

## 2017-06-16 DIAGNOSIS — G894 Chronic pain syndrome: Secondary | ICD-10-CM | POA: Diagnosis present

## 2017-06-16 DIAGNOSIS — J449 Chronic obstructive pulmonary disease, unspecified: Secondary | ICD-10-CM | POA: Diagnosis present

## 2017-06-16 DIAGNOSIS — Z8673 Personal history of transient ischemic attack (TIA), and cerebral infarction without residual deficits: Secondary | ICD-10-CM | POA: Diagnosis not present

## 2017-06-16 DIAGNOSIS — S72001A Fracture of unspecified part of neck of right femur, initial encounter for closed fracture: Secondary | ICD-10-CM | POA: Diagnosis not present

## 2017-06-16 DIAGNOSIS — W19XXXA Unspecified fall, initial encounter: Secondary | ICD-10-CM | POA: Diagnosis present

## 2017-06-16 DIAGNOSIS — I723 Aneurysm of iliac artery: Secondary | ICD-10-CM | POA: Diagnosis present

## 2017-06-16 DIAGNOSIS — G2581 Restless legs syndrome: Secondary | ICD-10-CM | POA: Diagnosis present

## 2017-06-16 DIAGNOSIS — F17210 Nicotine dependence, cigarettes, uncomplicated: Secondary | ICD-10-CM | POA: Diagnosis present

## 2017-06-16 DIAGNOSIS — Z96649 Presence of unspecified artificial hip joint: Secondary | ICD-10-CM

## 2017-06-16 DIAGNOSIS — T148XXA Other injury of unspecified body region, initial encounter: Secondary | ICD-10-CM | POA: Diagnosis not present

## 2017-06-16 DIAGNOSIS — R2681 Unsteadiness on feet: Secondary | ICD-10-CM | POA: Diagnosis not present

## 2017-06-16 DIAGNOSIS — M25551 Pain in right hip: Secondary | ICD-10-CM | POA: Diagnosis not present

## 2017-06-16 DIAGNOSIS — R531 Weakness: Secondary | ICD-10-CM | POA: Diagnosis not present

## 2017-06-16 DIAGNOSIS — Z90710 Acquired absence of both cervix and uterus: Secondary | ICD-10-CM

## 2017-06-16 DIAGNOSIS — I714 Abdominal aortic aneurysm, without rupture: Secondary | ICD-10-CM | POA: Diagnosis not present

## 2017-06-16 DIAGNOSIS — Z9981 Dependence on supplemental oxygen: Secondary | ICD-10-CM | POA: Diagnosis not present

## 2017-06-16 DIAGNOSIS — Z86718 Personal history of other venous thrombosis and embolism: Secondary | ICD-10-CM | POA: Diagnosis not present

## 2017-06-16 DIAGNOSIS — Z9889 Other specified postprocedural states: Secondary | ICD-10-CM | POA: Diagnosis present

## 2017-06-16 DIAGNOSIS — S72091A Other fracture of head and neck of right femur, initial encounter for closed fracture: Principal | ICD-10-CM | POA: Diagnosis present

## 2017-06-16 DIAGNOSIS — G43909 Migraine, unspecified, not intractable, without status migrainosus: Secondary | ICD-10-CM | POA: Diagnosis not present

## 2017-06-16 DIAGNOSIS — Z8711 Personal history of peptic ulcer disease: Secondary | ICD-10-CM

## 2017-06-16 DIAGNOSIS — M6281 Muscle weakness (generalized): Secondary | ICD-10-CM | POA: Diagnosis not present

## 2017-06-16 DIAGNOSIS — Z471 Aftercare following joint replacement surgery: Secondary | ICD-10-CM | POA: Diagnosis not present

## 2017-06-16 DIAGNOSIS — Z96641 Presence of right artificial hip joint: Secondary | ICD-10-CM | POA: Diagnosis not present

## 2017-06-16 DIAGNOSIS — J439 Emphysema, unspecified: Secondary | ICD-10-CM | POA: Diagnosis not present

## 2017-06-16 DIAGNOSIS — S72031A Displaced midcervical fracture of right femur, initial encounter for closed fracture: Secondary | ICD-10-CM | POA: Diagnosis not present

## 2017-06-16 DIAGNOSIS — I679 Cerebrovascular disease, unspecified: Secondary | ICD-10-CM | POA: Diagnosis not present

## 2017-06-16 LAB — CBC WITH DIFFERENTIAL/PLATELET
BASOS ABS: 0.1 10*3/uL (ref 0–0.1)
BASOS PCT: 1 %
EOS ABS: 0.1 10*3/uL (ref 0–0.7)
Eosinophils Relative: 1 %
HEMATOCRIT: 43.8 % (ref 35.0–47.0)
HEMOGLOBIN: 14.6 g/dL (ref 12.0–16.0)
Lymphocytes Relative: 11 %
Lymphs Abs: 1.6 10*3/uL (ref 1.0–3.6)
MCH: 30.6 pg (ref 26.0–34.0)
MCHC: 33.4 g/dL (ref 32.0–36.0)
MCV: 91.6 fL (ref 80.0–100.0)
Monocytes Absolute: 0.7 10*3/uL (ref 0.2–0.9)
Monocytes Relative: 5 %
NEUTROS ABS: 11.6 10*3/uL — AB (ref 1.4–6.5)
NEUTROS PCT: 82 %
Platelets: 312 10*3/uL (ref 150–440)
RBC: 4.78 MIL/uL (ref 3.80–5.20)
RDW: 14.8 % — ABNORMAL HIGH (ref 11.5–14.5)
WBC: 14.2 10*3/uL — AB (ref 3.6–11.0)

## 2017-06-16 LAB — BASIC METABOLIC PANEL
ANION GAP: 9 (ref 5–15)
BUN: 9 mg/dL (ref 6–20)
CO2: 27 mmol/L (ref 22–32)
Calcium: 9 mg/dL (ref 8.9–10.3)
Chloride: 100 mmol/L — ABNORMAL LOW (ref 101–111)
Creatinine, Ser: 0.81 mg/dL (ref 0.44–1.00)
GFR calc non Af Amer: >60 mL/min (ref >60)
Glucose, Bld: 117 mg/dL — ABNORMAL HIGH (ref 65–99)
Potassium: 4.2 mmol/L (ref 3.5–5.1)
SODIUM: 136 mmol/L (ref 135–145)

## 2017-06-16 LAB — PROTIME-INR
INR: 1.84
INR: 1.07
PROTHROMBIN TIME: 21.1 s — AB (ref 11.4–15.2)
Prothrombin Time: 13.8 seconds (ref 11.4–15.2)

## 2017-06-16 LAB — TSH: TSH: 1.111 u[IU]/mL (ref 0.350–4.500)

## 2017-06-16 LAB — SURGICAL PCR SCREEN
MRSA, PCR: NEGATIVE
STAPHYLOCOCCUS AUREUS: NEGATIVE

## 2017-06-16 MED ORDER — ATENOLOL 25 MG PO TABS
25.0000 mg | ORAL_TABLET | Freq: Two times a day (BID) | ORAL | Status: DC
  Administered 2017-06-16 – 2017-06-20 (×9): 25 mg via ORAL
  Filled 2017-06-16 (×9): qty 1

## 2017-06-16 MED ORDER — DEXTROSE 5 % IV SOLN
2.0000 g | INTRAVENOUS | Status: AC
  Filled 2017-06-16: qty 20

## 2017-06-16 MED ORDER — FENTANYL CITRATE (PF) 100 MCG/2ML IJ SOLN
50.0000 ug | Freq: Once | INTRAMUSCULAR | Status: AC
  Administered 2017-06-16: 50 ug via INTRAVENOUS

## 2017-06-16 MED ORDER — ONDANSETRON HCL 4 MG/2ML IJ SOLN: 4.0000 mg | Freq: Four times a day (QID) | INTRAMUSCULAR | Status: DC | PRN

## 2017-06-16 MED ORDER — FENTANYL CITRATE (PF) 100 MCG/2ML IJ SOLN
INTRAMUSCULAR | Status: AC
  Administered 2017-06-16: 50 ug via INTRAVENOUS
  Filled 2017-06-16: qty 2

## 2017-06-16 MED ORDER — FENTANYL CITRATE (PF) 100 MCG/2ML IJ SOLN
INTRAMUSCULAR | Status: AC
  Filled 2017-06-16: qty 2

## 2017-06-16 MED ORDER — DOCUSATE SODIUM 100 MG PO CAPS
100.0000 mg | ORAL_CAPSULE | Freq: Two times a day (BID) | ORAL | Status: DC
  Administered 2017-06-16 – 2017-06-20 (×9): 100 mg via ORAL
  Filled 2017-06-16 (×9): qty 1

## 2017-06-16 MED ORDER — MIRTAZAPINE 15 MG PO TABS
15.0000 mg | ORAL_TABLET | Freq: Every day | ORAL | Status: DC
  Administered 2017-06-16 – 2017-06-20 (×5): 15 mg via ORAL
  Filled 2017-06-16 (×6): qty 1

## 2017-06-16 MED ORDER — POLYETHYLENE GLYCOL 3350 17 G PO PACK
17.0000 g | PACK | Freq: Every day | ORAL | Status: DC | PRN
  Administered 2017-06-19: 17 g via ORAL
  Filled 2017-06-16: qty 1

## 2017-06-16 MED ORDER — CHLORHEXIDINE GLUCONATE 4 % EX LIQD
Freq: Two times a day (BID) | CUTANEOUS | Status: DC
  Administered 2017-06-17 (×2): via TOPICAL

## 2017-06-16 MED ORDER — HEPARIN SODIUM (PORCINE) 5000 UNIT/ML IJ SOLN
5000.0000 [IU] | Freq: Three times a day (TID) | INTRAMUSCULAR | Status: DC
  Administered 2017-06-16 (×2): 5000 [IU] via SUBCUTANEOUS
  Filled 2017-06-16 (×2): qty 1

## 2017-06-16 MED ORDER — OXYCODONE-ACETAMINOPHEN 5-325 MG PO TABS
1.0000 | ORAL_TABLET | ORAL | Status: DC | PRN
  Administered 2017-06-16 – 2017-06-20 (×14): 1 via ORAL
  Filled 2017-06-16 (×16): qty 1

## 2017-06-16 MED ORDER — FERROUS SULFATE 325 (65 FE) MG PO TABS
325.0000 mg | ORAL_TABLET | Freq: Every day | ORAL | Status: DC
  Administered 2017-06-18 – 2017-06-20 (×3): 325 mg via ORAL
  Filled 2017-06-16 (×3): qty 1

## 2017-06-16 MED ORDER — MAGNESIUM OXIDE 400 (241.3 MG) MG PO TABS
400.0000 mg | ORAL_TABLET | Freq: Every day | ORAL | Status: DC
  Administered 2017-06-16 – 2017-06-20 (×4): 400 mg via ORAL
  Filled 2017-06-16 (×4): qty 1

## 2017-06-16 MED ORDER — PANTOPRAZOLE SODIUM 40 MG PO TBEC
40.0000 mg | DELAYED_RELEASE_TABLET | Freq: Every day | ORAL | Status: DC
  Administered 2017-06-16 – 2017-06-18 (×2): 40 mg via ORAL
  Filled 2017-06-16 (×2): qty 1

## 2017-06-16 MED ORDER — ALBUTEROL SULFATE (2.5 MG/3ML) 0.083% IN NEBU: 2.5000 mg | INHALATION_SOLUTION | RESPIRATORY_TRACT | Status: DC | PRN

## 2017-06-16 MED ORDER — ACETAMINOPHEN 650 MG RE SUPP: 650.0000 mg | Freq: Four times a day (QID) | RECTAL | Status: DC | PRN

## 2017-06-16 MED ORDER — CLINDAMYCIN PHOSPHATE 600 MG/50ML IV SOLN
600.0000 mg | INTRAVENOUS | Status: AC
  Filled 2017-06-16: qty 50

## 2017-06-16 MED ORDER — MORPHINE SULFATE (PF) 2 MG/ML IV SOLN
2.0000 mg | INTRAVENOUS | Status: DC | PRN
  Administered 2017-06-16 – 2017-06-19 (×5): 2 mg via INTRAVENOUS
  Filled 2017-06-16 (×5): qty 1

## 2017-06-16 MED ORDER — VITAMIN D 1000 UNITS PO TABS
2000.0000 [IU] | ORAL_TABLET | Freq: Every day | ORAL | Status: DC
  Administered 2017-06-16 – 2017-06-20 (×4): 2000 [IU] via ORAL
  Filled 2017-06-16 (×4): qty 2

## 2017-06-16 MED ORDER — METHOCARBAMOL 500 MG PO TABS
750.0000 mg | ORAL_TABLET | Freq: Three times a day (TID) | ORAL | Status: DC | PRN
  Filled 2017-06-16: qty 2

## 2017-06-16 MED ORDER — ONDANSETRON HCL 4 MG PO TABS
4.0000 mg | ORAL_TABLET | Freq: Four times a day (QID) | ORAL | Status: DC | PRN
  Administered 2017-06-18: 4 mg via ORAL
  Filled 2017-06-16: qty 1

## 2017-06-16 MED ORDER — ATORVASTATIN CALCIUM 10 MG PO TABS
10.0000 mg | ORAL_TABLET | Freq: Every day | ORAL | Status: DC
  Administered 2017-06-16 – 2017-06-20 (×5): 10 mg via ORAL
  Filled 2017-06-16 (×5): qty 1

## 2017-06-16 MED ORDER — ACETAMINOPHEN 325 MG PO TABS: 650.0000 mg | ORAL_TABLET | Freq: Four times a day (QID) | ORAL | Status: DC | PRN

## 2017-06-16 NOTE — NC FL2 (Signed)
[place] LEVEL OF CARE SCREENING TOOL     IDENTIFICATION  Patient Name: Tasha Reyes Birthdate: [birth year] Sex: female Admission Date (Current Location): 06/16/2017  [place] and [place] Number:  Engineering geologist and Address:  [facility], [address], [place], [place] [address]      Provider Number: [id number]  Attending Physician Name and Address:  Vaughan Basta, *  Relative Name and Phone Number:       Current Level of Care: Hospital Recommended Level of Care: Benjamin Perez Prior Approval Number:    Date Approved/Denied:   PASRR Number: [id number])  Discharge Plan: SNF    Current Diagnoses: Patient Active Problem List   Diagnosis Date Noted  . Hip fracture ([place]) 06/16/2017  . AAA (abdominal aortic aneurysm) without rupture ([place]) 03/18/2017  . Edema due to malnutrition ([place]) 10/28/2016  . Dependence on supplemental oxygen 09/04/2016  . Protein-calorie malnutrition, severe 08/20/2016  . Nausea and vomiting 07/30/2016  . Vitamin D deficiency 01/10/2016  . Opioid-induced constipation (OIC) 11/07/2015  . Osteoporosis, post-menopausal 11/07/2015  . GERD (gastroesophageal reflux disease) 11/07/2015  . Lumbar foraminal stenosis (Right) (Severe at L4-5) 06/20/2015  . Chronic low back pain (Location of Secondary source of pain) (Bilateral) (R>L) 05/11/2015  . Chronic lower extremity pain (Location of Primary Source of Pain) (Right) 05/11/2015  . Lumbosacral Radiculopathy (Right) 05/11/2015  . Chronic radicular lumbar pain (Right) 05/11/2015  . Long term current use of opiate analgesic 05/11/2015  . Long term prescription opiate use 05/11/2015  . Opiate use 05/11/2015  . Peripheral vascular disease ([place]) 05/11/2015  . Lumbar spondylosis 05/11/2015  . Hx of long term use of blood thinners (Plavix) 05/11/2015  . Lower extremity pain (Right) 05/11/2015  . Chronic pain syndrome 05/11/2015  .  Neurogenic pain 05/11/2015  . History of DVT (deep vein thrombosis) 05/11/2015  . Lumbar facet hypertrophy 05/11/2015  . Facet syndrome, lumbar ([place]) 05/11/2015  . Dyslipidemia 12/23/2014  . Compulsive tobacco user syndrome 12/23/2014  . Chronic peptic ulcer with bleeding 12/23/2014  . Essential (primary) hypertension 12/23/2014  . Depression, major, recurrent, in partial remission ([place]) 12/23/2014  . COPD (chronic obstructive pulmonary disease) ([place]) 12/23/2014  . Vascular disorder of lower extremity 12/23/2014  . Restless leg 12/23/2014  . Chronic tension-type headache, intractable 12/23/2014    Orientation RESPIRATION BLADDER Height & Weight     Self, Time, Situation, Place  O2(2 Liters Oxygen. ) Continent Weight: 106 lb (48.1 kg) Height:  5\' 6"  (167.6 cm)  BEHAVIORAL SYMPTOMS/MOOD NEUROLOGICAL BOWEL NUTRITION STATUS      Continent Diet(Diet: Heart Healthy )  AMBULATORY STATUS COMMUNICATION OF NEEDS Skin   Extensive Assist Verbally Surgical wounds                       Personal Care Assistance Level of Assistance  Bathing, Feeding, Dressing Bathing Assistance: Limited assistance Feeding assistance: Independent Dressing Assistance: Limited assistance     Functional Limitations Info  Sight, Hearing, Speech Sight Info: Adequate Hearing Info: Adequate Speech Info: Adequate    SPECIAL CARE FACTORS FREQUENCY  PT (By licensed PT), OT (By licensed OT)     PT Frequency: (5) OT Frequency: (5)            Contractures      Additional Factors Info  Code Status, Allergies Code Status Info: (Full Code. ) Allergies Info: (No Known Allergies. )           Current Medications (  06/16/2017):  This is the current hospital active medication list Current Facility-Administered Medications  Medication Dose Route Frequency Provider Last Rate Last Dose  . acetaminophen (TYLENOL) tablet 650 mg  650 mg Oral Q6H PRN Harrie Foreman, MD       Or  . acetaminophen (TYLENOL)  suppository 650 mg  650 mg Rectal Q6H PRN Harrie Foreman, MD      . albuterol (PROVENTIL) (2.5 MG/3ML) 0.083% nebulizer solution 2.5 mg  2.5 mg Nebulization Q4H PRN Harrie Foreman, MD      . atenolol (TENORMIN) tablet 25 mg  25 mg Oral BID Harrie Foreman, MD   25 mg at 06/16/17 1157  . atorvastatin (LIPITOR) tablet 10 mg  10 mg Oral QHS Harrie Foreman, MD      . ceFAZolin (ANCEF) 2 g in dextrose 5 % 100 mL IVPB  2 g Intravenous On Call to OR Earnestine Leys, MD      . chlorhexidine (HIBICLENS) 4 % liquid   Topical BID Earnestine Leys, MD      . cholecalciferol (VITAMIN D) tablet 2,000 Units  2,000 Units Oral Daily Harrie Foreman, MD   2,000 Units at 06/16/17 1159  . clindamycin (CLEOCIN) IVPB 600 mg  600 mg Intravenous On Call to OR Earnestine Leys, MD      . docusate sodium (COLACE) capsule 100 mg  100 mg Oral BID Harrie Foreman, MD   100 mg at 06/16/17 1159  . ferrous sulfate tablet 325 mg  325 mg Oral Q breakfast Harrie Foreman, MD      . heparin injection 5,000 Units  5,000 Units Subcutaneous Q8H Harrie Foreman, MD   5,000 Units at 06/16/17 1311  . magnesium oxide (MAG-OX) tablet 400 mg  400 mg Oral Daily Harrie Foreman, MD   400 mg at 06/16/17 1157  . methocarbamol (ROBAXIN) tablet 750 mg  750 mg Oral TID PRN Harrie Foreman, MD      . mirtazapine (REMERON) tablet 15 mg  15 mg Oral QHS Harrie Foreman, MD      . morphine 2 MG/ML injection 2-4 mg  2-4 mg Intravenous Q3H PRN Harrie Foreman, MD      . ondansetron [facility]) tablet 4 mg  4 mg Oral Q6H PRN Harrie Foreman, MD       Or  . ondansetron [facility]) injection 4 mg  4 mg Intravenous Q6H PRN Harrie Foreman, MD      . oxyCODONE-acetaminophen (PERCOCET/ROXICET) 5-325 MG per tablet 1 tablet  1 tablet Oral Q4H PRN Vaughan Basta, MD   1 tablet at 06/16/17 1155  . pantoprazole (PROTONIX) EC tablet 40 mg  40 mg Oral Daily Harrie Foreman, MD   40 mg at 06/16/17 1159  . polyethylene  glycol (MIRALAX / GLYCOLAX) packet 17 g  17 g Oral Daily PRN Harrie Foreman, MD         Discharge Medications: Please see discharge summary for a list of discharge medications.  Relevant Imaging Results:  Relevant Lab Results:   Additional Information (SSN: [id number])  [name] Villescas, [name], LCSW

## 2017-06-16 NOTE — ED Notes (Signed)
Tubing and bandage changed d/t leaking.  Secured VORB from MD for repeat 6mcg IV Fentanyl since last dose did not go in.

## 2017-06-16 NOTE — ED Notes (Signed)
Patient sobbing in pain after she wet the bed and we changed her clothing and sheets out.  Asking MD for VORB for 27mcg IV Fentanyl.  Pt VS WNL.

## 2017-06-16 NOTE — Progress Notes (Addendum)
Wrong patient

## 2017-06-16 NOTE — ED Notes (Signed)
External female urinary catheter placed for urination. Pt unable to tolerate bed pan.

## 2017-06-16 NOTE — Consult Note (Signed)
ORTHOPAEDIC CONSULTATION  REQUESTING PHYSICIAN: Vaughan Basta, *  Chief Complaint: Right hip pain  HPI: Tasha Reyes is a 83 y.o. female who complains of right hip pain after a fall at home last night when she got up to go to the bathroom.  She got dizzy and missed sitting on the side of the bed.  She was brought to the emergency room where exam and x-rays reveal a displaced subcapital fracture of the right hip.  Admitted for medical evaluation and surgery.  She has a history of extensive vascular disease and has had abdominal aortic aneurysm surgery.  She is on Plavix.  For that reason we will wait 24 hours to do surgery.  Some benefits of surgery and postop protocol were discussed with the patient at length.  She lives with her son and wishes to go home from the hospital if possible.  Past Medical History:  Diagnosis Date  . Aneurysm of iliac artery (HCC) 05/11/2015  . COPD (chronic obstructive pulmonary disease) ([place])   . Cystitis   . History of DVT (deep vein thrombosis) 05/11/2015  . History of peptic ulcer disease 05/11/2015  . [name]   . Hypertension   . Losing weight   . Migraines   . [name]   . Stroke ([place])   . Vascular disease    Past Surgical History:  Procedure Laterality Date  . ABDOMINAL AORTIC ANEURYSM REPAIR W/ ENDOLUMINAL GRAFT  2008  . ABDOMINAL HYSTERECTOMY    . APPENDECTOMY    . BACK SURGERY    . blood clot      removal right leg  . COLONOSCOPY  2012  . ESOPHAGOGASTRODUODENOSCOPY  04/2013   gastric ulcers  . FEMORAL-POPLITEAL BYPASS GRAFT Right 2008  . PARTIAL HYSTERECTOMY     Social History   Socioeconomic History  . Marital status: Widowed    Spouse name: Not on file  . Number of children: Not on file  . Years of education: Not on file  . Highest education level: Not on file  Social Needs  . Financial resource strain: Not on file  . Food insecurity - worry: Not on file  . Food insecurity -  inability: Not on file  . Transportation needs - medical: Not on file  . Transportation needs - non-medical: Not on file  Occupational History  . Not on file  Tobacco Use  . Smoking status: Current Every Day Smoker    Packs/day: 0.25    Years: 61.00    Pack years: 15.25    Types: Cigarettes  . Smokeless tobacco: Never Used  . Tobacco comment: 3 cigrettes a day- cut back  Substance and Sexual Activity  . Alcohol use: No    Alcohol/week: 0.0 oz  . Drug use: No  . Sexual activity: Not on file  Other Topics Concern  . Not on file  Social History Narrative  . Not on file   Family History  Problem Relation Age of Onset  . Brain cancer Father   . Kidney disease Mother    No Known Allergies Prior to Admission medications   Medication Sig Start Date End Date Taking? Authorizing Provider  atenolol (TENORMIN) 25 MG tablet Take 1 tablet (25 mg total) by mouth 2 (two) times daily. 10/28/16  Yes Glean Hess, MD  atorvastatin (LIPITOR) 10 MG tablet TAKE 1 TABLET BY MOUTH EVERY NIGHT AT BEDTIME 06/05/17  Yes Glean Hess, MD  Cholecalciferol (VITAMIN D3) 2000 units capsule Take 1 capsule (  2,000 Units total) by mouth daily. 04/22/17  Yes Vevelyn Francois, NP  clopidogrel (PLAVIX) 75 MG tablet TAKE 1 TABLET BY MOUTH DAILY 02/26/17  Yes Dew, Erskine Squibb, MD  ferrous sulfate 325 (65 FE) MG tablet TAKE 1 TABLET(325 MG) BY MOUTH DAILY 01/01/17  Yes Glean Hess, MD  MAGNESIUM-OXIDE 400 (241.3 Mg) MG tablet Take 1 tablet (400 mg total) by mouth daily. Reported on 11/07/2015 04/22/17 07/21/17 Yes Vevelyn Francois, NP  mirtazapine (REMERON) 15 MG tablet Take 15 mg by mouth at bedtime.  04/15/17  Yes [provider]  pantoprazole (PROTONIX) 40 MG tablet TAKE 1 TABLET(40 MG) BY MOUTH TWICE DAILY 11/06/16  Yes Glean Hess, MD  polyethylene glycol powder (GLYCOLAX/MIRALAX) powder MIX AND TAKE 17 GRAMS BY MOUTH prn 01/30/16  Yes [provider]  predniSONE (DELTASONE) 5 MG tablet  Take 1 tablet (5 mg total) by mouth daily with breakfast. 04/30/17  Yes Glean Hess, MD  albuterol (PROVENTIL) (2.5 MG/3ML) 0.083% nebulizer solution Take 3 mLs (2.5 mg total) by nebulization every 4 (four) hours as needed for wheezing or shortness of breath. 08/21/16   Fritzi Mandes, MD  HYDROcodone-acetaminophen (NORCO/VICODIN) 5-325 MG tablet Take 1 tablet by mouth every 8 (eight) hours as needed for severe pain. 07/17/17 08/16/17  Vevelyn Francois, NP  methocarbamol (ROBAXIN) 750 MG tablet Take 1 tablet (750 mg total) by mouth 3 (three) times daily as needed for muscle spasms. 04/22/17 07/21/17  Vevelyn Francois, NP   Dg Chest Portable 1 View  Result Date: 06/16/2017 CLINICAL DATA:  83 year old female with right hip fracture. Preop evaluation. EXAM: PORTABLE CHEST 1 VIEW COMPARISON:  Chest radiograph dated 10/04/2016 FINDINGS: There is emphysematous changes of the lungs. No focal consolidation, pleural effusion, or pneumothorax. The cardiac silhouette is within normal limits. There is atherosclerotic calcification of the thoracic aorta. No acute osseous pathology. IMPRESSION: No acute cardiopulmonary process. Emphysema. Electronically Signed   By: Anner Crete M.D.   On: 06/16/2017 05:59   Dg Hip Unilat W Or Wo Pelvis 2-3 Views Right  Result Date: 06/16/2017 CLINICAL DATA:  83 year old female with fall and right hip pain. EXAM: DG HIP (WITH OR WITHOUT PELVIS) 2-3V RIGHT COMPARISON:  None. FINDINGS: There is a transverse fracture of the right femoral neck with foreshortening of the femoral neck. No dislocation. The bones are mildly osteopenic. There is atherosclerotic calcification of the vessels. The soft tissues are grossly unremarkable. An endovascular stent graft is noted in the distal aorta. IMPRESSION: Transverse fracture of the right femoral neck with apparent impaction and foreshortening of the femoral neck. No dislocation. Electronically Signed   By: Anner Crete M.D.   On: 06/16/2017 05:27     Positive ROS: All other systems have been reviewed and were otherwise negative with the exception of those mentioned in the HPI and as above.  Physical Exam: General: Alert, no acute distress Cardiovascular: No pedal edema Respiratory: No cyanosis, no use of accessory musculature GI: No organomegaly, abdomen is soft and non-tender Skin: No lesions in the area of chief complaint Neurologic: Sensation intact distally Psychiatric: Patient is competent for consent with normal mood and affect Lymphatic: No axillary or cervical lymphadenopathy  MUSCULOSKELETAL: The patient is alert and fully oriented.  The right leg is slightly shortened and rotated.  There is pain with movement of the hip.  Neurovascular status is good distally.  Skin is intact.  No other injuries are noted.  Assessment: Displaced subcapital fracture right hip  Plan: Right hip hemiarthroplasty tomorrow.    Park Breed, MD [phone]   06/16/2017 1:31 PM

## 2017-06-16 NOTE — ED Notes (Signed)
Family at bedside. 

## 2017-06-16 NOTE — [place] Notes (Signed)
[place] at [place] NAME: Tasha Reyes    MR#:  [id number]  DATE OF BIRTH:  02/05/1931  SUBJECTIVE:  CHIEF COMPLAINT:   Chief Complaint  Patient presents with  . Fall      Came after an accidental fall, have pain in hip- found to have fracture.on traction and awaited surgery tomorrow. She tool plavix last night. INR is slightly high.  REVIEW OF SYSTEMS:  CONSTITUTIONAL: No fever, fatigue or weakness.  EYES: No blurred or double vision.  EARS, NOSE, AND THROAT: No tinnitus or ear pain.  RESPIRATORY: No cough, shortness of breath, wheezing or hemoptysis.  CARDIOVASCULAR: No chest pain, orthopnea, edema.  GASTROINTESTINAL: No nausea, vomiting, diarrhea or abdominal pain.  GENITOURINARY: No dysuria, hematuria.  ENDOCRINE: No polyuria, nocturia,  HEMATOLOGY: No anemia, easy bruising or bleeding SKIN: No rash or lesion. MUSCULOSKELETAL: No joint pain or arthritis.   NEUROLOGIC: No tingling, numbness, weakness.  PSYCHIATRY: No anxiety or depression.   ROS  DRUG ALLERGIES:  No Known Allergies  VITALS:  Blood pressure (!) 157/65, pulse 73, temperature 98.6 F (37 C), temperature source Oral, resp. rate 20, height 5\' 6"  (1.676 m), weight 48.1 kg (106 lb), SpO2 92 %.  PHYSICAL EXAMINATION:   Constitutional: Negative for chills and fever.  HENT: Negative for sore throat and tinnitus.   Eyes: Negative for blurred vision and redness.  Respiratory: Negative for cough and shortness of breath.   Cardiovascular: Negative for chest pain, palpitations, orthopnea and PND.  Gastrointestinal: Negative for abdominal pain, diarrhea, nausea and vomiting.  Genitourinary: Negative for dysuria, frequency and urgency.  Musculoskeletal: Positive for joint pain. Negative for myalgias.  Skin: Negative for rash.       No lesions  Neurological: Negative for speech change, focal weakness and weakness.  Endo/Heme/Allergies: Does not bruise/bleed easily.        No temperature intolerance  Psychiatric/Behavioral: Negative for depression and suicidal ideas.      Physical Exam LABORATORY PANEL:   CBC Recent Labs  Lab 06/16/17 0543  WBC 14.2*  HGB 14.6  HCT 43.8  PLT 312   ------------------------------------------------------------------------------------------------------------------  Chemistries  Recent Labs  Lab 06/16/17 0543  NA 136  K 4.2  CL 100*  CO2 27  GLUCOSE 117*  BUN 9  CREATININE 0.81  CALCIUM 9.0   ------------------------------------------------------------------------------------------------------------------  Cardiac Enzymes No results for input(s): TROPONINI in the last 168 hours. ------------------------------------------------------------------------------------------------------------------  RADIOLOGY:  Dg Chest Portable 1 View  Result Date: 06/16/2017 CLINICAL DATA:  83 year old female with right hip fracture. Preop evaluation. EXAM: PORTABLE CHEST 1 VIEW COMPARISON:  Chest radiograph dated 10/04/2016 FINDINGS: There is emphysematous changes of the lungs. No focal consolidation, pleural effusion, or pneumothorax. The cardiac silhouette is within normal limits. There is atherosclerotic calcification of the thoracic aorta. No acute osseous pathology. IMPRESSION: No acute cardiopulmonary process. Emphysema. Electronically Signed   By: Anner Crete M.D.   On: 06/16/2017 05:59   Dg Hip Unilat W Or Wo Pelvis 2-3 Views Right  Result Date: 06/16/2017 CLINICAL DATA:  84 year old female with fall and right hip pain. EXAM: DG HIP (WITH OR WITHOUT PELVIS) 2-3V RIGHT COMPARISON:  None. FINDINGS: There is a transverse fracture of the right femoral neck with foreshortening of the femoral neck. No dislocation. The bones are mildly osteopenic. There is atherosclerotic calcification of the vessels. The soft tissues are grossly unremarkable. An endovascular stent graft is noted in the distal aorta. IMPRESSION:  Transverse fracture of the right femoral  neck with apparent impaction and foreshortening of the femoral neck. No dislocation. Electronically Signed   By: Anner Crete M.D.   On: 06/16/2017 05:27    ASSESSMENT AND PLAN:   Active Problems:   Hip fracture [facility])  This is a 83 year old female admitted for hip fracture. 1.  Hip fracture: Right femoral neck fracture.  Manage pain.  Placed in traction.  DVT per orthopedic surgery.  The patient is high risk based on advanced age.  Plavix held. placed on heparin in the meantime which needs to be stopped 12 hours prior to surgery. No further work ups needed before surgery.   Check UA 2.  Cerebrovascular disease: No indication of neurological or cardiac insult preceding fall.  Continue secondary prevention of stroke. 3.  Essential hypertension: Controlled; continue atenolol. 4.  Hyperlipidemia: Continue statin therapy    All the records are reviewed and case discussed with Care Management/Social Workerr. Management plans discussed with the patient, family and they are in agreement.  CODE STATUS: [place].  TOTAL TIME TAKING CARE OF THIS PATIENT: 35 minutes.     POSSIBLE D/C IN 2-3 DAYS, DEPENDING ON CLINICAL CONDITION.   Vaughan Basta M.D on 06/16/2017   Between 7am to 6pm - Pager - [phone]  After 6pm go to [email or web address] - password [email or web address] [facility]  Office  [phone]  CC: Primary care physician; Glean Hess, MD  Note: This dictation was prepared with Dragon dictation along with smaller phrase technology. Any transcriptional errors that result from this process are unintentional.

## 2017-06-16 NOTE — ED Provider Notes (Signed)
[facility] Emergency Department Provider Note   ____________________________________________   I have reviewed the triage vital signs and the nursing notes.   HISTORY  Chief Complaint Right hip pain  History limited by: Not Limited   HPI Tasha Reyes is a 83 y.o. female who presents to the emergency department today because of right hip pain.   LOCATION:right hip DURATION:tonight TIMING: started suddenly SEVERITY: severe QUALITY: sharp CONTEXT: patient states that she fell onto her right side when she got up tonight to go to the bathroom. States that she got lightheaded. This does happen to her sometimes when she stands up too fast. She went to sit back on her bed but missed it.  MODIFYING FACTORS: worse with movement ASSOCIATED SYMPTOMS: states she did have some numbness to that leg but that has resolved.   Per medical record review patient has a history of aortic aneurysm repair, dvt and on plavix.   Past Medical History:  Diagnosis Date  . Aneurysm of iliac artery (HCC) 05/11/2015  . COPD (chronic obstructive pulmonary disease) ([place])   . Cystitis   . History of DVT (deep vein thrombosis) 05/11/2015  . History of peptic ulcer disease 05/11/2015  . [name]   . Hypertension   . Losing weight   . Migraines   . [name]   . Stroke ([place])   . Vascular disease     Patient Active Problem List   Diagnosis Date Noted  . AAA (abdominal aortic aneurysm) without rupture ([place]) 03/18/2017  . Edema due to malnutrition ([place]) 10/28/2016  . Dependence on supplemental oxygen 09/04/2016  . Protein-calorie malnutrition, severe 08/20/2016  . Nausea and vomiting 07/30/2016  . Vitamin D deficiency 01/10/2016  . Opioid-induced constipation (OIC) 11/07/2015  . Osteoporosis, post-menopausal 11/07/2015  . GERD (gastroesophageal reflux disease) 11/07/2015  . Lumbar foraminal stenosis (Right) (Severe at L4-5) 06/20/2015  .  Chronic low back pain (Location of Secondary source of pain) (Bilateral) (R>L) 05/11/2015  . Chronic lower extremity pain (Location of Primary Source of Pain) (Right) 05/11/2015  . Lumbosacral Radiculopathy (Right) 05/11/2015  . Chronic radicular lumbar pain (Right) 05/11/2015  . Long term current use of opiate analgesic 05/11/2015  . Long term prescription opiate use 05/11/2015  . Opiate use 05/11/2015  . Peripheral vascular disease ([place]) 05/11/2015  . Lumbar spondylosis 05/11/2015  . Hx of long term use of blood thinners (Plavix) 05/11/2015  . Lower extremity pain (Right) 05/11/2015  . Chronic pain syndrome 05/11/2015  . Neurogenic pain 05/11/2015  . History of DVT (deep vein thrombosis) 05/11/2015  . Lumbar facet hypertrophy 05/11/2015  . Facet syndrome, lumbar ([place]) 05/11/2015  . Dyslipidemia 12/23/2014  . Compulsive tobacco user syndrome 12/23/2014  . Chronic peptic ulcer with bleeding 12/23/2014  . Essential (primary) hypertension 12/23/2014  . Depression, major, recurrent, in partial remission ([place]) 12/23/2014  . COPD (chronic obstructive pulmonary disease) ([place]) 12/23/2014  . Vascular disorder of lower extremity 12/23/2014  . Restless leg 12/23/2014  . Chronic tension-type headache, intractable 12/23/2014    Past Surgical History:  Procedure Laterality Date  . ABDOMINAL AORTIC ANEURYSM REPAIR W/ ENDOLUMINAL GRAFT  2008  . ABDOMINAL HYSTERECTOMY    . APPENDECTOMY    . BACK SURGERY    . blood clot      removal right leg  . COLONOSCOPY  2012  . ESOPHAGOGASTRODUODENOSCOPY  04/2013   gastric ulcers  . FEMORAL-POPLITEAL BYPASS GRAFT Right 2008  . PARTIAL HYSTERECTOMY      Prior  to Admission medications   Medication Sig Start Date End Date Taking? Authorizing Provider  albuterol (PROVENTIL) (2.5 MG/3ML) 0.083% nebulizer solution Take 3 mLs (2.5 mg total) by nebulization every 4 (four) hours as needed for wheezing or shortness of breath. 08/21/16   Fritzi Mandes, MD  atenolol  (TENORMIN) 25 MG tablet Take 1 tablet (25 mg total) by mouth 2 (two) times daily. 10/28/16   Glean Hess, MD  atorvastatin (LIPITOR) 10 MG tablet TAKE 1 TABLET BY MOUTH EVERY NIGHT AT BEDTIME 06/05/17   Glean Hess, MD  Cholecalciferol (VITAMIN D3) 2000 units capsule Take 1 capsule (2,000 Units total) by mouth daily. 04/22/17   Vevelyn Francois, NP  clopidogrel (PLAVIX) 75 MG tablet TAKE 1 TABLET BY MOUTH DAILY 02/26/17   Algernon Huxley, MD  ferrous sulfate 325 (65 FE) MG tablet TAKE 1 TABLET(325 MG) BY MOUTH DAILY 01/01/17   Glean Hess, MD  HYDROcodone-acetaminophen (NORCO/VICODIN) 5-325 MG tablet Take 1 tablet by mouth every 8 (eight) hours as needed for severe pain. 07/17/17 08/16/17  Vevelyn Francois, NP  MAGNESIUM-OXIDE 400 (241.3 Mg) MG tablet Take 1 tablet (400 mg total) by mouth daily. Reported on 11/07/2015 04/22/17 07/21/17  Vevelyn Francois, NP  methocarbamol (ROBAXIN) 750 MG tablet Take 1 tablet (750 mg total) by mouth 3 (three) times daily as needed for muscle spasms. 04/22/17 07/21/17  Vevelyn Francois, NP  mirtazapine (REMERON) 15 MG tablet  04/15/17   [provider]  pantoprazole (PROTONIX) 40 MG tablet TAKE 1 TABLET(40 MG) BY MOUTH TWICE DAILY 11/06/16   Glean Hess, MD  polyethylene glycol powder (GLYCOLAX/MIRALAX) powder MIX AND TAKE 17 GRAMS BY MOUTH prn 01/30/16   [provider]  predniSONE (DELTASONE) 5 MG tablet Take 1 tablet (5 mg total) by mouth daily with breakfast. 04/30/17   Glean Hess, MD    Allergies Patient has no known allergies.  Family History  Problem Relation Age of Onset  . Brain cancer Father   . Kidney disease Mother     Social History Social History   Tobacco Use  . Smoking status: Current Every Day Smoker    Packs/day: 0.25    Years: 61.00    Pack years: 15.25    Types: Cigarettes  . Smokeless tobacco: Never Used  . Tobacco comment: 3 cigrettes a day- cut back  Substance Use Topics  . Alcohol use: No     Alcohol/week: 0.0 oz  . Drug use: No    Review of Systems Constitutional: No fever/chills Eyes: No visual changes. ENT: No sore throat. Cardiovascular: Denies chest pain. Respiratory: Denies shortness of breath. Gastrointestinal: No abdominal pain.  No nausea, no vomiting.  No diarrhea.   Genitourinary: Negative for dysuria. Musculoskeletal: Positive for right hip pain. Skin: Negative for rash. Neurological: Positive for lightheadedness ____________________________________________   PHYSICAL EXAM:  VITAL SIGNS: ED Triage Vitals  Enc Vitals Group     BP 06/16/17 0429 (!) 141/102     Pulse Rate 06/16/17 0429 71     Resp 06/16/17 0429 18     Temp --      Temp Source 06/16/17 0429 Oral     SpO2 06/16/17 0429 94 %     Weight 06/16/17 0429 106 lb (48.1 kg)     Height 06/16/17 0429 5\' 6"  (1.676 m)     Head Circumference --      Peak Flow --      Pain Score 06/16/17 0428 2  Constitutional: Alert and oriented. Slightly uncomfortable appearing. Eyes: Conjunctivae are normal.  ENT   Head: Normocephalic and atraumatic.   Nose: No congestion/rhinnorhea.   Mouth/Throat: Mucous membranes are moist.   Neck: No stridor. Hematological/Lymphatic/Immunilogical: No cervical lymphadenopathy. Cardiovascular: Normal rate, regular rhythm.  No murmurs, rubs, or gallops.  Respiratory: Normal respiratory effort without tachypnea nor retractions. Breath sounds are clear and equal bilaterally. No wheezes/rales/rhonchi. Gastrointestinal: Soft and non tender. No rebound. No guarding.  Genitourinary: Deferred Musculoskeletal: Right leg externally rotated. Tender to palpation and manipulation of right hip. Neurologic:  Normal speech and language. No gross focal neurologic deficits are appreciated.  Skin:  Skin is warm, dry and intact. No rash noted. Psychiatric: Mood and affect are normal. Speech and behavior are normal. Patient exhibits appropriate insight and  judgment.  ____________________________________________    LABS (pertinent positives/negatives)  Pending at time of admission  ____________________________________________   EKG  None  ____________________________________________    RADIOLOGY  Right hip Fracture of the femoral neck  I, Yoshiharu Brassell, personally viewed and evaluated these images (plain radiographs) as part of my medical decision making. ____________________________________________   PROCEDURES  Procedures  ____________________________________________   INITIAL IMPRESSION / ASSESSMENT AND PLAN / ED COURSE  Pertinent labs & imaging results that were available during my care of the patient were reviewed by me and considered in my medical decision making (see chart for details).  Patient presented to the emergency department today because of right hip pain after fall.  Differential would include dislocation, fracture, contusion, bursitis.  X-rays were consistent with fracture of the right femoral neck.   I discussed the findings with the patient.  I discussed need for admission and likely surgery.   ____________________________________________   FINAL CLINICAL IMPRESSION(S) / ED DIAGNOSES  Final diagnoses:  Fall, initial encounter  Closed fracture of right hip, initial encounter [facility])     Note: This dictation was prepared with Dragon dictation. Any transcriptional errors that result from this process are unintentional     Nance Pear, MD 06/16/17 [phone]

## 2017-06-16 NOTE — Clinical Social Work Note (Signed)
Clinical Social Work Assessment  Patient Details  Name: Tasha Reyes MRN: [id number] Date of Birth: 28-Jun-1931  Date of referral:  06/16/17               Reason for consult:  Facility Placement                Permission sought to share information with:  Chartered certified accountant granted to share information::  Yes, Verbal Permission Granted  Name::      Tasha Reyes::   Tasha Reyes  Relationship::     Contact Information:     Housing/Transportation Living arrangements for the past 2 months:  [place] of Information:  Patient Patient Interpreter Needed:  None Criminal Activity/Legal Involvement Pertinent to Current Situation/Hospitalization:  No - Comment as needed Significant Relationships:  Adult Children Lives with:  Adult Children Do you feel safe going back to the place where you live?  Yes Need for family participation in patient care:  Yes (Comment)  Care giving concerns:  Patient lives in [place] with her son Tasha Reyes.    Social Worker assessment / plan:  Holiday representative (CSW) reviewed chart and noted that patient will have surgery for a hip fracture at some point. CSW met with patient alone at bedside today to discuss D/C plan. Patient was alert and oriented X4 and was laying in the bed. CSW introduced self and explained role of CSW department. Patient reported that she lives in [place] with her son Tasha Reyes. CSW explained that PT will evaluate patient after surgery and make a recommendation of home health or SNF. Patient prefers to go home but will consider SNF. CSW explained that medicare requires a 3 night qualifying inpatient stay in a hospital in order to pay for SNF. Patient was admitted to inpatient on 06/16/17. Patient is agreeable to SNF search in [place] and [place] counties. FL2 complete and faxed out. CSW will continue to follow and assist as needed.   Employment  status:  Disabled (Comment on whether or not currently receiving Disability), Retired Forensic scientist:  Medicare PT Recommendations:  Not assessed at this time Information / Referral to community resources:  [place]  Patient/Family's Response to care:  Patient prefers to go home but will consider SNF.   Patient/Family's Understanding of and Emotional Response to Diagnosis, Current Treatment, and Prognosis:  Patient was very pleasant and thanked CSW for assistance.   Emotional Assessment Appearance:  Appears stated age Attitude/Demeanor/Rapport:    Affect (typically observed):  Accepting, Adaptable, Pleasant Orientation:  Oriented to Self, Oriented to Place, Oriented to  Time, Oriented to Situation Alcohol / Substance use:  Not Applicable Psych involvement (Current and /or in the community):  No (Comment)  Discharge Needs  Concerns to be addressed:  Discharge Planning Concerns Readmission within the last 30 days:  No Current discharge risk:  Dependent with Mobility Barriers to Discharge:  Continued Medical Work up   [employer], [name], LCSW 06/16/2017, 2:11 PM

## 2017-06-16 NOTE — ED Triage Notes (Signed)
Pt from home with [place] post fall. Pt states she got up to use restroom and became dizzy, went to sit back on bed and "missed" landing on buttocks. Pt complains of right hip pain. Ems gave 72mcg of fentanyl.

## 2017-06-16 NOTE — H&P (Addendum)
Tasha Reyes is an 83 y.o. female.   Chief Complaint: Fall HPI: The patient with past medical history of hypertension, stroke, COPD and aneurysm of the iliac artery presents to the emergency department after suffering a fall.  The patient states that she tripped.  She denies lightheadedness, dizziness, loss of vision, difficulty swallowing or speaking.  In the emergency department x-ray of her right hip showed a fracture of the femoral neck.  Orthopedic surgery was contacted and the hospitalist service asked for further management.  Past Medical History:  Diagnosis Date  . Aneurysm of iliac artery (HCC) 05/11/2015  . COPD (chronic obstructive pulmonary disease) ([place])   . Cystitis   . History of DVT (deep vein thrombosis) 05/11/2015  . History of peptic ulcer disease 05/11/2015  . [name]   . Hypertension   . Losing weight   . Migraines   . [name]   . Stroke ([place])   . Vascular disease     Past Surgical History:  Procedure Laterality Date  . ABDOMINAL AORTIC ANEURYSM REPAIR W/ ENDOLUMINAL GRAFT  2008  . ABDOMINAL HYSTERECTOMY    . APPENDECTOMY    . BACK SURGERY    . blood clot      removal right leg  . COLONOSCOPY  2012  . ESOPHAGOGASTRODUODENOSCOPY  04/2013   gastric ulcers  . FEMORAL-POPLITEAL BYPASS GRAFT Right 2008  . PARTIAL HYSTERECTOMY      Family History  Problem Relation Age of Onset  . Brain cancer Father   . Kidney disease Mother    Social History:  reports that she has been smoking cigarettes.  She has a 15.25 pack-year smoking history. she has never used smokeless tobacco. She reports that she does not drink alcohol or use drugs.  Allergies: No Known Allergies  Prior to Admission medications   Medication Sig Start Date End Date Taking? Authorizing Provider  atenolol (TENORMIN) 25 MG tablet Take 1 tablet (25 mg total) by mouth 2 (two) times daily. 10/28/16  Yes Glean Hess, MD  atorvastatin (LIPITOR) 10 MG tablet TAKE  1 TABLET BY MOUTH EVERY NIGHT AT BEDTIME 06/05/17  Yes Glean Hess, MD  Cholecalciferol (VITAMIN D3) 2000 units capsule Take 1 capsule (2,000 Units total) by mouth daily. 04/22/17  Yes Vevelyn Francois, NP  clopidogrel (PLAVIX) 75 MG tablet TAKE 1 TABLET BY MOUTH DAILY 02/26/17  Yes Dew, Erskine Squibb, MD  ferrous sulfate 325 (65 FE) MG tablet TAKE 1 TABLET(325 MG) BY MOUTH DAILY 01/01/17  Yes Glean Hess, MD  MAGNESIUM-OXIDE 400 (241.3 Mg) MG tablet Take 1 tablet (400 mg total) by mouth daily. Reported on 11/07/2015 04/22/17 07/21/17 Yes Vevelyn Francois, NP  mirtazapine (REMERON) 15 MG tablet Take 15 mg by mouth at bedtime.  04/15/17  Yes [provider]  pantoprazole (PROTONIX) 40 MG tablet TAKE 1 TABLET(40 MG) BY MOUTH TWICE DAILY 11/06/16  Yes Glean Hess, MD  polyethylene glycol powder (GLYCOLAX/MIRALAX) powder MIX AND TAKE 17 GRAMS BY MOUTH prn 01/30/16  Yes [provider]  predniSONE (DELTASONE) 5 MG tablet Take 1 tablet (5 mg total) by mouth daily with breakfast. 04/30/17  Yes Glean Hess, MD  albuterol (PROVENTIL) (2.5 MG/3ML) 0.083% nebulizer solution Take 3 mLs (2.5 mg total) by nebulization every 4 (four) hours as needed for wheezing or shortness of breath. 08/21/16   Fritzi Mandes, MD  HYDROcodone-acetaminophen (NORCO/VICODIN) 5-325 MG tablet Take 1 tablet by mouth every 8 (eight) hours as needed for  severe pain. 07/17/17 08/16/17  Vevelyn Francois, NP  methocarbamol (ROBAXIN) 750 MG tablet Take 1 tablet (750 mg total) by mouth 3 (three) times daily as needed for muscle spasms. 04/22/17 07/21/17  Vevelyn Francois, NP     Results for orders placed or performed during the hospital encounter of 06/16/17 (from the past 48 hour(s))  CBC with Differential     Status: Abnormal   Collection Time: 06/16/17  5:43 AM  Result Value Ref Range   WBC 14.2 (H) 3.6 - 11.0 K/uL   RBC 4.78 3.80 - 5.20 MIL/uL   Hemoglobin 14.6 12.0 - 16.0 g/dL   HCT 43.8 35.0 - 47.0 %   MCV 91.6 80.0 -  100.0 fL   MCH 30.6 26.0 - 34.0 pg   MCHC 33.4 32.0 - 36.0 g/dL   RDW 14.8 (H) 11.5 - 14.5 %   Platelets 312 150 - 440 K/uL   Neutrophils Relative % 82 %   Neutro Abs 11.6 (H) 1.4 - 6.5 K/uL   Lymphocytes Relative 11 %   Lymphs Abs 1.6 1.0 - 3.6 K/uL   Monocytes Relative 5 %   Monocytes Absolute 0.7 0.2 - 0.9 K/uL   Eosinophils Relative 1 %   Eosinophils Absolute 0.1 0 - 0.7 K/uL   Basophils Relative 1 %   Basophils Absolute 0.1 0 - 0.1 K/uL   Dg Chest Portable 1 View  Result Date: 06/16/2017 CLINICAL DATA:  83 year old female with right hip fracture. Preop evaluation. EXAM: PORTABLE CHEST 1 VIEW COMPARISON:  Chest radiograph dated 10/04/2016 FINDINGS: There is emphysematous changes of the lungs. No focal consolidation, pleural effusion, or pneumothorax. The cardiac silhouette is within normal limits. There is atherosclerotic calcification of the thoracic aorta. No acute osseous pathology. IMPRESSION: No acute cardiopulmonary process. Emphysema. Electronically Signed   By: Anner Crete M.D.   On: 06/16/2017 05:59   Dg Hip Unilat W Or Wo Pelvis 2-3 Views Right  Result Date: 06/16/2017 CLINICAL DATA:  83 year old female with fall and right hip pain. EXAM: DG HIP (WITH OR WITHOUT PELVIS) 2-3V RIGHT COMPARISON:  None. FINDINGS: There is a transverse fracture of the right femoral neck with foreshortening of the femoral neck. No dislocation. The bones are mildly osteopenic. There is atherosclerotic calcification of the vessels. The soft tissues are grossly unremarkable. An endovascular stent graft is noted in the distal aorta. IMPRESSION: Transverse fracture of the right femoral neck with apparent impaction and foreshortening of the femoral neck. No dislocation. Electronically Signed   By: Anner Crete M.D.   On: 06/16/2017 05:27    Review of Systems  Constitutional: Negative for chills and fever.  HENT: Negative for sore throat and tinnitus.   Eyes: Negative for blurred vision and  redness.  Respiratory: Negative for cough and shortness of breath.   Cardiovascular: Negative for chest pain, palpitations, orthopnea and PND.  Gastrointestinal: Negative for abdominal pain, diarrhea, nausea and vomiting.  Genitourinary: Negative for dysuria, frequency and urgency.  Musculoskeletal: Positive for joint pain. Negative for myalgias.  Skin: Negative for rash.       No lesions  Neurological: Negative for speech change, focal weakness and weakness.  Endo/Heme/Allergies: Does not bruise/bleed easily.       No temperature intolerance  Psychiatric/Behavioral: Negative for depression and suicidal ideas.    Blood pressure (!) 152/85, pulse 70, resp. rate 18, height 5\' 6"  (1.676 m), weight 48.1 kg (106 lb), SpO2 94 %. Physical Exam  Vitals reviewed. Constitutional: She is oriented to  person, place, and time. She appears well-developed and well-nourished. No distress.  HENT:  Head: Normocephalic and atraumatic.  Mouth/Throat: Oropharynx is clear and moist.  Eyes: Conjunctivae and EOM are normal. Pupils are equal, round, and reactive to light. No scleral icterus.  Neck: Normal range of motion. Neck supple. No JVD present. No tracheal deviation present. No thyromegaly present.  Cardiovascular: Normal rate, regular rhythm and normal heart sounds. Exam reveals no gallop and no friction rub.  No murmur heard. Respiratory: Effort normal and breath sounds normal.  GI: Soft. Bowel sounds are normal. She exhibits no distension. There is no tenderness.  Genitourinary:  Genitourinary Comments: Deferred  Musculoskeletal: She exhibits deformity (right leg rotated and shortened). She exhibits no edema.  Lymphadenopathy:    She has no cervical adenopathy.  Neurological: She is alert and oriented to person, place, and time. No cranial nerve deficit. She exhibits normal muscle tone.  Skin: Skin is warm and dry. No rash noted. No erythema.  Psychiatric: She has a normal mood and affect. Her  behavior is normal. Judgment and thought content normal.     Assessment/Plan This is a 83 year old female admitted for hip fracture. 1.  Hip fracture: Right femoral neck fracture.  Manage pain.  Placed in traction.  Orders per orthopedic surgery.  The patient is high risk based on advanced age.  She is also been on Plavix which will need at least a 3-day washout.  I have placed on heparin in the meantime which needs to be stopped 12 hours prior to surgery. 2.  Cerebrovascular disease: No indication of neurological or cardiac insult preceding fall.  Continue secondary prevention of stroke. 3.  Essential hypertension: Controlled; continue atenolol. 4.  Hyperlipidemia: Continue statin therapy The patient is a full code.  Time spent on admission orders and patient care approximately 45 minutes  Harrie Foreman, MD 06/16/2017, 6:11 AM

## 2017-06-16 NOTE — Clinical Social Work Placement (Signed)
   CLINICAL SOCIAL WORK PLACEMENT  NOTE  Date:  06/16/2017  Patient Details  Name: Tasha Reyes MRN: [id number] Date of Birth: May 21, 1931  Clinical Social Work is seeking post-discharge placement for this patient at the [place] level of care (*CSW will initial, date and re-position this form in  chart as items are completed):  Yes   Patient/family provided with [place] Work Department's list of facilities offering this level of care within the geographic area requested by the patient (or if unable, by the patient's family).  Yes   Patient/family informed of their freedom to choose among providers that offer the needed level of care, that participate in Medicare, Medicaid or managed care program needed by the patient, have an available bed and are willing to accept the patient.  Yes   Patient/family informed of [facility]'s ownership interest in [facility] and [facility], as well as of the fact that they are under no obligation to receive care at these facilities.  PASRR submitted to EDS on 06/16/17     PASRR number received on 06/16/17     Existing PASRR number confirmed on       FL2 transmitted to all facilities in geographic area requested by pt/family on 06/16/17     FL2 transmitted to all facilities within larger geographic area on       Patient informed that his/her managed care company has contracts with or will negotiate with certain facilities, including the following:            Patient/family informed of bed offers received.  Patient chooses bed at       Physician recommends and patient chooses bed at      Patient to be transferred to   on  .  Patient to be transferred to facility by       Patient family notified on   of transfer.  Name of family member notified:        PHYSICIAN       Additional Comment:    _______________________________________________ Srishti Strnad, [name], LCSW 06/16/2017, 2:09 PM

## 2017-06-17 ENCOUNTER — Inpatient Hospital Stay: Payer: Medicare Other | Admitting: Anesthesiology

## 2017-06-17 ENCOUNTER — Inpatient Hospital Stay: Payer: Medicare Other

## 2017-06-17 ENCOUNTER — Encounter
Admission: EM | Disposition: A | Discharge status: Skilled Nursing Facility | Payer: Self-pay | Source: Home / Self Care | Attending: Internal Medicine

## 2017-06-17 ENCOUNTER — Encounter: Payer: Self-pay | Admitting: *Deleted

## 2017-06-17 HISTORY — PX: HIP ARTHROPLASTY: SHX981

## 2017-06-17 LAB — URINALYSIS, COMPLETE (UACMP) WITH MICROSCOPIC
Bilirubin Urine: NEGATIVE
Glucose, UA: NEGATIVE mg/dL
Hgb urine dipstick: NEGATIVE
Ketones, ur: 20 mg/dL — AB
Nitrite: NEGATIVE
PH: 5 (ref 5.0–8.0)
Protein, ur: NEGATIVE mg/dL
SPECIFIC GRAVITY, URINE: 1.016 (ref 1.005–1.030)

## 2017-06-17 LAB — TYPE AND SCREEN
ABO/RH(D): A POS
Antibody Screen: NEGATIVE

## 2017-06-17 SURGERY — HEMIARTHROPLASTY, HIP, DIRECT ANTERIOR APPROACH, FOR FRACTURE
Anesthesia: General | Laterality: Right

## 2017-06-17 MED ORDER — SENNA 8.6 MG PO TABS
1.0000 | ORAL_TABLET | Freq: Two times a day (BID) | ORAL | Status: DC
  Administered 2017-06-17 – 2017-06-20 (×7): 8.6 mg via ORAL
  Filled 2017-06-17 (×7): qty 1

## 2017-06-17 MED ORDER — ONDANSETRON HCL 4 MG/2ML IJ SOLN: 4.0000 mg | Freq: Four times a day (QID) | INTRAMUSCULAR | Status: DC | PRN

## 2017-06-17 MED ORDER — METOCLOPRAMIDE HCL 5 MG/ML IJ SOLN: 5.0000 mg | Freq: Three times a day (TID) | INTRAMUSCULAR | Status: DC | PRN

## 2017-06-17 MED ORDER — CEFAZOLIN SODIUM-DEXTROSE 2-4 GM/100ML-% IV SOLN: 2.0000 g | Freq: Three times a day (TID) | INTRAVENOUS | Status: DC

## 2017-06-17 MED ORDER — CLINDAMYCIN PHOSPHATE 600 MG/50ML IV SOLN
600.0000 mg | Freq: Three times a day (TID) | INTRAVENOUS | Status: AC
  Administered 2017-06-17 – 2017-06-18 (×3): 600 mg via INTRAVENOUS
  Filled 2017-06-17 (×3): qty 50

## 2017-06-17 MED ORDER — METHOCARBAMOL 500 MG PO TABS: 500.0000 mg | ORAL_TABLET | Freq: Four times a day (QID) | ORAL | Status: DC | PRN

## 2017-06-17 MED ORDER — LIDOCAINE HCL (PF) 2 % IJ SOLN
INTRAMUSCULAR | Status: AC
  Filled 2017-06-17: qty 10

## 2017-06-17 MED ORDER — BISACODYL 10 MG RE SUPP: 10.0000 mg | Freq: Every day | RECTAL | Status: DC | PRN

## 2017-06-17 MED ORDER — ENOXAPARIN SODIUM 30 MG/0.3ML ~~LOC~~ SOLN
30.0000 mg | SUBCUTANEOUS | Status: DC
  Administered 2017-06-18: 30 mg via SUBCUTANEOUS
  Filled 2017-06-17: qty 0.3

## 2017-06-17 MED ORDER — FENTANYL CITRATE (PF) 100 MCG/2ML IJ SOLN
INTRAMUSCULAR | Status: DC | PRN
  Administered 2017-06-17: 25 ug via INTRAVENOUS
  Administered 2017-06-17: 50 ug via INTRAVENOUS
  Administered 2017-06-17: 25 ug via INTRAVENOUS

## 2017-06-17 MED ORDER — ACETAMINOPHEN 325 MG PO TABS
650.0000 mg | ORAL_TABLET | Freq: Four times a day (QID) | ORAL | Status: DC | PRN
  Administered 2017-06-20: 650 mg via ORAL
  Filled 2017-06-17: qty 2

## 2017-06-17 MED ORDER — DEXAMETHASONE SODIUM PHOSPHATE 10 MG/ML IJ SOLN
INTRAMUSCULAR | Status: DC | PRN
  Administered 2017-06-17: 8 mg via INTRAVENOUS

## 2017-06-17 MED ORDER — ROCURONIUM BROMIDE 50 MG/5ML IV SOLN
INTRAVENOUS | Status: AC
  Filled 2017-06-17: qty 1

## 2017-06-17 MED ORDER — MENTHOL 3 MG MT LOZG
1.0000 | LOZENGE | OROMUCOSAL | Status: DC | PRN
  Filled 2017-06-17: qty 9

## 2017-06-17 MED ORDER — METOCLOPRAMIDE HCL 10 MG PO TABS: 5.0000 mg | ORAL_TABLET | Freq: Three times a day (TID) | ORAL | Status: DC | PRN

## 2017-06-17 MED ORDER — FERROUS SULFATE 325 (65 FE) MG PO TABS: 325.0000 mg | ORAL_TABLET | Freq: Every day | ORAL | Status: DC

## 2017-06-17 MED ORDER — ACETAMINOPHEN 650 MG RE SUPP: 650.0000 mg | Freq: Four times a day (QID) | RECTAL | Status: DC | PRN

## 2017-06-17 MED ORDER — FENTANYL CITRATE (PF) 100 MCG/2ML IJ SOLN
INTRAMUSCULAR | Status: AC
  Filled 2017-06-17: qty 2

## 2017-06-17 MED ORDER — ALUM & MAG HYDROXIDE-SIMETH 200-200-20 MG/5ML PO SUSP
30.0000 mL | ORAL | Status: DC | PRN
  Administered 2017-06-19 – 2017-06-20 (×2): 30 mL via ORAL
  Filled 2017-06-17 (×2): qty 30

## 2017-06-17 MED ORDER — CLINDAMYCIN PHOSPHATE 600 MG/50ML IV SOLN
INTRAVENOUS | Status: DC | PRN
  Administered 2017-06-17: 600 mg via INTRAVENOUS

## 2017-06-17 MED ORDER — MORPHINE SULFATE (PF) 2 MG/ML IV SOLN: 0.5000 mg | INTRAVENOUS | Status: DC | PRN

## 2017-06-17 MED ORDER — BUPIVACAINE-EPINEPHRINE (PF) 0.25% -1:200000 IJ SOLN
INTRAMUSCULAR | Status: AC
  Filled 2017-06-17: qty 30

## 2017-06-17 MED ORDER — LIDOCAINE HCL (CARDIAC) 20 MG/ML IV SOLN
INTRAVENOUS | Status: DC | PRN
  Administered 2017-06-17: 50 mg via INTRAVENOUS

## 2017-06-17 MED ORDER — METHOCARBAMOL 1000 MG/10ML IJ SOLN
500.0000 mg | Freq: Four times a day (QID) | INTRAVENOUS | Status: DC | PRN
  Filled 2017-06-17: qty 5

## 2017-06-17 MED ORDER — FENTANYL CITRATE (PF) 100 MCG/2ML IJ SOLN
25.0000 ug | INTRAMUSCULAR | Status: DC | PRN
Start: 2017-06-17 — End: 2017-06-17
  Administered 2017-06-17: 25 ug via INTRAVENOUS

## 2017-06-17 MED ORDER — SODIUM CHLORIDE 0.9 % IJ SOLN
INTRAMUSCULAR | Status: AC
  Filled 2017-06-17: qty 50

## 2017-06-17 MED ORDER — ZOLPIDEM TARTRATE 5 MG PO TABS: 5.0000 mg | ORAL_TABLET | Freq: Every evening | ORAL | Status: DC | PRN

## 2017-06-17 MED ORDER — SUGAMMADEX SODIUM 200 MG/2ML IV SOLN
INTRAVENOUS | Status: AC
  Filled 2017-06-17: qty 2

## 2017-06-17 MED ORDER — PHENYLEPHRINE 8 MG IN D5W 100 ML (0.08MG/ML) PREMIX OPTIME
INJECTION | INTRAVENOUS | Status: DC | PRN
  Administered 2017-06-17 (×2): 30 ug/min via INTRAVENOUS

## 2017-06-17 MED ORDER — ROCURONIUM BROMIDE 100 MG/10ML IV SOLN
INTRAVENOUS | Status: DC | PRN
  Administered 2017-06-17: 10 mg via INTRAVENOUS
  Administered 2017-06-17: 15 mg via INTRAVENOUS
  Administered 2017-06-17: 25 mg via INTRAVENOUS

## 2017-06-17 MED ORDER — PROPOFOL 10 MG/ML IV BOLUS
INTRAVENOUS | Status: DC | PRN
  Administered 2017-06-17: 60 mg via INTRAVENOUS

## 2017-06-17 MED ORDER — ONDANSETRON HCL 4 MG/2ML IJ SOLN: 4.0000 mg | Freq: Once | INTRAMUSCULAR | Status: DC | PRN

## 2017-06-17 MED ORDER — DEXTROSE 5 % IV SOLN
2.0000 g | Freq: Three times a day (TID) | INTRAVENOUS | Status: AC
  Administered 2017-06-17 – 2017-06-18 (×3): 2 g via INTRAVENOUS
  Filled 2017-06-17 (×3): qty 2000

## 2017-06-17 MED ORDER — SODIUM CHLORIDE 0.9 % IV SOLN
INTRAVENOUS | Status: DC
  Administered 2017-06-17: 10:00:00 via INTRAVENOUS

## 2017-06-17 MED ORDER — ONDANSETRON HCL 4 MG/2ML IJ SOLN
INTRAMUSCULAR | Status: AC
  Filled 2017-06-17: qty 2

## 2017-06-17 MED ORDER — PHENYLEPHRINE HCL 10 MG/ML IJ SOLN
INTRAMUSCULAR | Status: DC | PRN
  Administered 2017-06-17: 100 ug via INTRAVENOUS

## 2017-06-17 MED ORDER — CEFAZOLIN SODIUM-DEXTROSE 2-3 GM-%(50ML) IV SOLR
INTRAVENOUS | Status: DC | PRN
  Administered 2017-06-17: 2 g via INTRAVENOUS

## 2017-06-17 MED ORDER — EPHEDRINE SULFATE 50 MG/ML IJ SOLN
INTRAMUSCULAR | Status: DC | PRN
  Administered 2017-06-17: 5 mg via INTRAVENOUS

## 2017-06-17 MED ORDER — PHENOL 1.4 % MT LIQD
1.0000 | OROMUCOSAL | Status: DC | PRN
  Filled 2017-06-17: qty 177

## 2017-06-17 MED ORDER — BUPIVACAINE-EPINEPHRINE (PF) 0.25% -1:200000 IJ SOLN
INTRAMUSCULAR | Status: DC | PRN
  Administered 2017-06-17: 30 mL via PERINEURAL

## 2017-06-17 MED ORDER — SUGAMMADEX SODIUM 200 MG/2ML IV SOLN
INTRAVENOUS | Status: DC | PRN
  Administered 2017-06-17: 100 mg via INTRAVENOUS

## 2017-06-17 MED ORDER — DEXAMETHASONE SODIUM PHOSPHATE 10 MG/ML IJ SOLN
INTRAMUSCULAR | Status: AC
  Filled 2017-06-17: qty 1

## 2017-06-17 MED ORDER — PROPOFOL 10 MG/ML IV BOLUS
INTRAVENOUS | Status: AC
  Filled 2017-06-17: qty 20

## 2017-06-17 MED ORDER — BUPIVACAINE LIPOSOME 1.3 % IJ SUSP
INTRAMUSCULAR | Status: AC
  Filled 2017-06-17: qty 20

## 2017-06-17 MED ORDER — NEOMYCIN-POLYMYXIN B GU 40-200000 IR SOLN
Status: AC
  Filled 2017-06-17: qty 20

## 2017-06-17 MED ORDER — HYDROCODONE-ACETAMINOPHEN 5-325 MG PO TABS: 1.0000 | ORAL_TABLET | Freq: Four times a day (QID) | ORAL | Status: DC | PRN

## 2017-06-17 MED ORDER — FENTANYL CITRATE (PF) 100 MCG/2ML IJ SOLN
INTRAMUSCULAR | Status: AC
  Administered 2017-06-17: 25 ug via INTRAVENOUS
  Filled 2017-06-17: qty 2

## 2017-06-17 MED ORDER — ONDANSETRON HCL 4 MG/2ML IJ SOLN
INTRAMUSCULAR | Status: DC | PRN
  Administered 2017-06-17: 4 mg via INTRAVENOUS

## 2017-06-17 MED ORDER — ONDANSETRON HCL 4 MG PO TABS: 4.0000 mg | ORAL_TABLET | Freq: Four times a day (QID) | ORAL | Status: DC | PRN

## 2017-06-17 MED ORDER — FLEET ENEMA 7-19 GM/118ML RE ENEM: 1.0000 | ENEMA | Freq: Once | RECTAL | Status: DC | PRN

## 2017-06-17 MED ORDER — DEXTROSE 5 % IV SOLN
1.0000 g | INTRAVENOUS | Status: DC
  Administered 2017-06-18 – 2017-06-19 (×2): 1 g via INTRAVENOUS
  Filled 2017-06-17 (×3): qty 10

## 2017-06-17 MED ORDER — SODIUM CHLORIDE 0.45 % IV SOLN
INTRAVENOUS | Status: DC
  Administered 2017-06-17: 18:00:00 via INTRAVENOUS

## 2017-06-17 SURGICAL SUPPLY — 52 items
BAG COUNTER SPONGE EZ (MISCELLANEOUS) ×2 IMPLANT
BLADE DEBAKEY 8.0 (BLADE) ×2 IMPLANT
BLADE DEBAKEY 8.0MM (BLADE) ×1
BLADE SAGITTAL WIDE XTHICK NO (BLADE) ×3 IMPLANT
BLADE SURG SZ10 CARB STEEL (BLADE) ×3 IMPLANT
CANISTER SUCT 1200ML W/VALVE (MISCELLANEOUS) ×9 IMPLANT
CAPT HIP HEMI 2 ×3 IMPLANT
CHLORAPREP W/TINT 26ML (MISCELLANEOUS) ×6 IMPLANT
COUNTER SPONGE BAG EZ (MISCELLANEOUS) ×1
DRAPE INCISE IOBAN 66X60 STRL (DRAPES) ×6 IMPLANT
DRAPE TABLE BACK 80X90 (DRAPES) ×3 IMPLANT
DRSG AQUACEL AG ADV 3.5X10 (GAUZE/BANDAGES/DRESSINGS) ×3 IMPLANT
DRSG AQUACEL AG ADV 3.5X14 (GAUZE/BANDAGES/DRESSINGS) ×3 IMPLANT
ELECT BLADE 6.5 EXT (BLADE) ×3 IMPLANT
ELECT CAUTERY BLADE 6.4 (BLADE) ×3 IMPLANT
ELECT REM PT RETURN 9FT ADLT (ELECTROSURGICAL) ×3
ELECTRODE REM PT RTRN 9FT ADLT (ELECTROSURGICAL) ×1 IMPLANT
EVACUATOR 1/8 PVC DRAIN (DRAIN) ×3 IMPLANT
GAUZE PETRO XEROFOAM 1X8 (MISCELLANEOUS) ×6 IMPLANT
GAUZE SPONGE 4X4 12PLY STRL (GAUZE/BANDAGES/DRESSINGS) ×3 IMPLANT
GLOVE INDICATOR 8.0 STRL GRN (GLOVE) ×3 IMPLANT
GLOVE SURG ORTHO 8.5 STRL (GLOVE) ×3 IMPLANT
GOWN STRL REUS W/ TWL LRG LVL3 (GOWN DISPOSABLE) ×2 IMPLANT
GOWN STRL REUS W/TWL LRG LVL3 (GOWN DISPOSABLE) ×4
GOWN STRL REUS W/TWL LRG LVL4 (GOWN DISPOSABLE) ×3 IMPLANT
HIP CAPITATED HEMI 2 ×1 IMPLANT
IV NS 1000ML (IV SOLUTION) ×2
IV NS 1000ML BAXH (IV SOLUTION) ×1 IMPLANT
KIT RM TURNOVER STRD PROC AR (KITS) ×3 IMPLANT
NEEDLE FILTER BLUNT 18X 1/2SAF (NEEDLE) ×2
NEEDLE FILTER BLUNT 18X1 1/2 (NEEDLE) ×1 IMPLANT
NEEDLE MAYO CATGUT SZ4 (NEEDLE) ×3 IMPLANT
NEEDLE SPNL 18GX3.5 QUINCKE PK (NEEDLE) ×6 IMPLANT
NS IRRIG 1000ML POUR BTL (IV SOLUTION) ×3 IMPLANT
PACK HIP PROSTHESIS (MISCELLANEOUS) ×3 IMPLANT
PAD ABD DERMACEA PRESS 5X9 (GAUZE/BANDAGES/DRESSINGS) ×6 IMPLANT
PULSAVAC PLUS IRRIG FAN TIP (DISPOSABLE) ×3
SOL PREP PVP 2OZ (MISCELLANEOUS) ×3
SOLUTION PREP PVP 2OZ (MISCELLANEOUS) ×1 IMPLANT
STAPLER SKIN PROX 35W (STAPLE) ×3 IMPLANT
SUT DVC 2 QUILL PDO  T11 36X36 (SUTURE) ×4
SUT DVC 2 QUILL PDO T11 36X36 (SUTURE) ×2 IMPLANT
SUT QUILL PDO 0 36 36 VIOLET (SUTURE) ×3 IMPLANT
SUT TICRON 2-0 30IN 311381 (SUTURE) ×15 IMPLANT
SYR 10ML LL (SYRINGE) ×3 IMPLANT
SYR 30ML LL (SYRINGE) ×3 IMPLANT
SYR 50ML LL SCALE MARK (SYRINGE) ×3 IMPLANT
TAPE MICROFOAM 4IN (TAPE) ×3 IMPLANT
TIP BRUSH PULSAVAC PLUS 24.33 (MISCELLANEOUS) ×3 IMPLANT
TIP FAN IRRIG PULSAVAC PLUS (DISPOSABLE) ×1 IMPLANT
TUBE SUCT KAM VAC (TUBING) ×3 IMPLANT
WATER STERILE IRR 1000ML POUR (IV SOLUTION) ×3 IMPLANT

## 2017-06-17 NOTE — Anesthesia Preprocedure Evaluation (Signed)
Anesthesia Evaluation  Patient identified by MRN, date of birth, ID band Patient awake    Reviewed: Allergy & Precautions, H&P , NPO status , Patient's Chart, lab work & pertinent test results, reviewed documented beta blocker date and time   History of Anesthesia Complications Negative for: history of anesthetic complications  Airway Mallampati: II  TM Distance: >3 FB Neck ROM: full    Dental  (+) Edentulous Upper, Edentulous Lower, Dental Advidsory Given   Pulmonary neg pulmonary ROS, shortness of breath and with exertion, neg sleep apnea, COPD (mild), neg recent URI, Current Smoker,           Cardiovascular Exercise Tolerance: Poor hypertension, (-) angina+ Peripheral Vascular Disease  (-) CAD, (-) Past MI, (-) Cardiac Stents and (-) CABG negative cardio ROS  (-) dysrhythmias (-) Valvular Problems/Murmurs     Neuro/Psych neg Seizures PSYCHIATRIC DISORDERS  Neuromuscular disease CVA, No Residual Symptoms    GI/Hepatic Neg liver ROS, PUD, GERD  ,  Endo/Other  negative endocrine ROS  Renal/GU negative Renal ROS  negative genitourinary   Musculoskeletal   Abdominal   Peds  Hematology negative hematology ROS (+)   Anesthesia Other Findings Past Medical History: 05/11/2015: Aneurysm of iliac artery (HCC) No date: COPD (chronic obstructive pulmonary disease) (HCC) No date: Cystitis 05/11/2015: History of DVT (deep vein thrombosis) 05/11/2015: History of peptic ulcer disease No date: [name] No date: Hypertension No date: Losing weight No date: Migraines No date: [name] No date: Stroke ([place]) No date: Vascular disease   Reproductive/Obstetrics negative OB ROS                             Anesthesia Physical Anesthesia Plan  ASA: III  Anesthesia Plan: General   Post-op Pain Management:    Induction: Intravenous  PONV Risk Score and Plan: 2 and  Ondansetron and Dexamethasone  Airway Management Planned: Oral ETT  Additional Equipment:   Intra-op Plan:   Post-operative Plan: Extubation in OR  Informed Consent: I have reviewed the patients History and Physical, chart, labs and discussed the procedure including the risks, benefits and alternatives for the proposed anesthesia with the patient or authorized representative who has indicated his/her understanding and acceptance.   Dental Advisory Given  Plan Discussed with: Anesthesiologist, CRNA and Surgeon  Anesthesia Plan Comments:         Anesthesia Quick Evaluation

## 2017-06-17 NOTE — Op Note (Signed)
06/17/2017  3:48 PM  PATIENT:  Tasha Reyes   MRN: [id number]  PRE-OPERATIVE DIAGNOSIS:  Displaced Subcapital fracture right hip   POST-OPERATIVE DIAGNOSIS: Same  PROCEDURE:  Right   hip hemiarthroplasty with Stryker Accolade prosthesis  PREOPERATIVE INDICATIONS:  Tasha Reyes is an 83 y.o. female who was admitted 06/16/2017 with a diagnosis of displaced subcapital fracture of the hip and elected for surgical management.  The risks benefits and alternatives were discussed with the patient including but not limited to the risks of nonoperative treatment, versus surgical intervention including infection, bleeding, nerve injury, periprosthetic fracture, the need for revision surgery, dislocation, leg length discrepancy, blood clots, cardiopulmonary complications, morbidity, mortality, among others, and they were willing to proceed.  Predicted outcome is good, although there will be at least a six to nine month expected recovery.     SURGEON:  Earnestine Leys, MD    ANESTHESIA: General ET    COMPLICATIONS:  None.   EBL:  100 cc    COMPONENTS:  Stryker Accolade Femoral Fracture stem size # 3  ,   and a size   43 mm  fracture head unipolar hip ball with    +4 mm  neck length.    PROCEDURE IN DETAIL: The patient was met in the holding area and identified.  The appropriate hip  was marked at the operative site. The patient was then transported to the OR and  placed under general anesthesia.  At that point, the patient was  placed in the lateral decubitus position with the operative side up and  secured to the operating room table and all bony prominences padded.     The operative lower extremity was prepped from the iliac crest to the toes.  Sterile draping was performed.  Time out was performed prior to incision.      A routine posterolateral approach was utilized via sharp dissection  carried down to the subcutaneous tissue.  Gross bleeders were Bovie  coagulated.  The iliotibial band  was identified and incised  along the length of the skin incision.  Self-retaining retractors were  inserted.  With the hip internally rotated, the short external rotators  were identified. The piriformis was tagged and the hip capsule released in a T-type fashion.  The femoral neck was exposed, and I resected the femoral neck using the appropriate jig. This was performed at approximately a thumb's breadth above the lesser trochanter.    I then exposed the deep acetabulum, cleared out any tissue including the ligamentum teres.    I then prepared the proximal femur using the cookie-cutter, the lateralizing reamer, and then sequentially broached.  A trial stem   was  utilized along with a unipolar head and neck.  I reduced the hip and it was found to have excellent stability with functional range of motion. Leg lengths were equal.  The trial components were then removed.   The same size Accolade femoral stem was then inserted and was very stable.  The Unitrax head and neck as trialed were inserted as well.     The hip was then reduced and taken through functional range of motion and found to have excellent stability. Leg lengths were restored.     I closed the T in the capsule with #2 Ticron as well as the short external rotators. A hemovac was inserted.    I then irrigated the hip copiously again with pulse lavage, and repaired the fascia with #2 Quill and the subcutaneous layer  with #0 Quill. Sponge and needle counts were correct. Dry sterile Aquacell was applied.   The patient was then awakened and returned to PACU in stable and satisfactory condition. There were no complications.  Park Breed, MD Orthopedic Surgeon [phone]   06/17/2017 3:48 PM

## 2017-06-17 NOTE — Plan of Care (Signed)
  Progressing Education: Knowledge of General Education information will improve 06/17/2017 0501 - Progressing by Roxie Kreeger, Lucille Passy, RN Health Behavior/Discharge Planning: Ability to manage health-related needs will improve 06/17/2017 0501 - Progressing by Cristiano Capri, Lucille Passy, RN Clinical Measurements: Ability to maintain clinical measurements within normal limits will improve 06/17/2017 0501 - Progressing by Elvert Cumpton, Lucille Passy, RN Will remain free from infection 06/17/2017 0501 - Progressing by Jaimin Krupka, Lucille Passy, RN Diagnostic test results will improve 06/17/2017 0501 - Progressing by Cyntha Brickman, Lucille Passy, RN Respiratory complications will improve 06/17/2017 0501 - Progressing by Bryna Colander, RN Cardiovascular complication will be avoided 06/17/2017 0501 - Progressing by Roniya Tetro, Lucille Passy, RN Activity: Risk for activity intolerance will decrease 06/17/2017 0501 - Progressing by Bryna Colander, RN Nutrition: Adequate nutrition will be maintained 06/17/2017 0501 - Progressing by Bryna Colander, RN Coping: Level of anxiety will decrease 06/17/2017 0501 - Progressing by Bryna Colander, RN Elimination: Will not experience complications related to bowel motility 06/17/2017 0501 - Progressing by Bryna Colander, RN Will not experience complications related to urinary retention 06/17/2017 0501 - Progressing by Richa Shor, Lucille Passy, RN Pain Managment: General experience of comfort will improve 06/17/2017 0501 - Progressing by Jameson Tormey, Lucille Passy, RN Safety: Ability to remain free from injury will improve 06/17/2017 0501 - Progressing by Aaleah Hirsch, Lucille Passy, RN Skin Integrity: Risk for impaired skin integrity will decrease 06/17/2017 0501 - Progressing by Cassidee Deats, Lucille Passy, RN

## 2017-06-17 NOTE — Anesthesia Procedure Notes (Signed)
Procedure Name: Intubation Performed by: Lance Muss, CRNA Pre-anesthesia Checklist: Patient identified, Patient being monitored, Timeout performed, Emergency Drugs available and Suction available Patient Re-evaluated:Patient Re-evaluated prior to induction Oxygen Delivery Method: Circle system utilized Preoxygenation: Pre-oxygenation with 100% oxygen Induction Type: IV induction Ventilation: Mask ventilation without difficulty and Oral airway inserted - appropriate to patient size Laryngoscope Size: Mac and 3 Grade View: Grade I Tube type: Oral Tube size: 7.0 mm Number of attempts: 1 Airway Equipment and Method: Stylet Placement Confirmation: ETT inserted through vocal cords under direct vision,  positive ETCO2 and breath sounds checked- equal and bilateral Secured at: 21 cm Tube secured with: Tape Dental Injury: Teeth and Oropharynx as per pre-operative assessment

## 2017-06-17 NOTE — H&P (Signed)
THE PATIENT WAS SEEN PRIOR TO SURGERY TODAY.  HISTORY, ALLERGIES, HOME MEDICATIONS AND OPERATIVE PROCEDURE WERE REVIEWED. RISKS AND BENEFITS OF SURGERY DISCUSSED WITH PATIENT AGAIN.  NO CHANGES FROM INITIAL HISTORY AND PHYSICAL NOTED.    

## 2017-06-17 NOTE — Transfer of Care (Signed)
Immediate Anesthesia Transfer of Care Note  Patient: Tasha Reyes  Procedure(s) Performed: ARTHROPLASTY BIPOLAR HIP (HEMIARTHROPLASTY) (Right )  Patient Location: PACU  Anesthesia Type:General  Level of Consciousness: sedated  Airway & Oxygen Therapy: Patient Spontanous Breathing and Patient connected to nasal cannula oxygen  Post-op Assessment: Report given to RN and Post -op Vital signs reviewed and stable  Post vital signs: Reviewed and stable  Last Vitals:  Vitals:   06/17/17 1236 06/17/17 1556  BP:  96/73  Pulse: 77 85  Resp: 20 16  Temp: 36.6 C 36.7 C  SpO2: 93% 93%    Last Pain:  Vitals:   06/17/17 1236  TempSrc: Tympanic  PainSc:       Patients Stated Pain Goal: 4 (32/95/18 8416)  Complications: No apparent anesthesia complications

## 2017-06-17 NOTE — Progress Notes (Signed)
[facility] [place] NAME: Tasha Reyes    MR#:  [id number]  DATE OF BIRTH:  09/27/1930  SUBJECTIVE:  CHIEF COMPLAINT:   Chief Complaint  Patient presents with  . Fall      Came after an accidental fall, have pain in hip- found to have fracture., for surgery today.  REVIEW OF SYSTEMS:  CONSTITUTIONAL: No fever, fatigue or weakness.  EYES: No blurred or double vision.  EARS, NOSE, AND THROAT: No tinnitus or ear pain.  RESPIRATORY: No cough, shortness of breath, wheezing or hemoptysis.  CARDIOVASCULAR: No chest pain, orthopnea, edema.  GASTROINTESTINAL: No nausea, vomiting, diarrhea or abdominal pain.  GENITOURINARY: No dysuria, hematuria.  ENDOCRINE: No polyuria, nocturia,  HEMATOLOGY: No anemia, easy bruising or bleeding SKIN: No rash or lesion. MUSCULOSKELETAL: No joint pain or arthritis.   NEUROLOGIC: No tingling, numbness, weakness.  PSYCHIATRY: No anxiety or depression.   ROS  DRUG ALLERGIES:  No Known Allergies  VITALS:  Blood pressure (!) 101/51, pulse 75, temperature 98.9 F (37.2 C), temperature source Oral, resp. rate 18, height 5\' 6"  (1.676 m), weight 48.1 kg (106 lb), SpO2 98 %.  PHYSICAL EXAMINATION:   Constitutional: Negative for chills and fever.  HENT: Negative for sore throat and tinnitus.   Eyes: Negative for blurred vision and redness.  Respiratory: Negative for cough and shortness of breath.   Cardiovascular: Negative for chest pain, palpitations, orthopnea and PND.  Gastrointestinal: Negative for abdominal pain, diarrhea, nausea and vomiting.  Genitourinary: Negative for dysuria, frequency and urgency.  Musculoskeletal: Positive for joint pain. Negative for myalgias.  Skin: Negative for rash.       No lesions  Neurological: Negative for speech change, focal weakness and weakness.  Endo/Heme/Allergies: Does not bruise/bleed easily.       No temperature intolerance  Psychiatric/Behavioral: Negative  for depression and suicidal ideas.    Physical Exam LABORATORY PANEL:   CBC Recent Labs  Lab 06/16/17 0543  WBC 14.2*  HGB 14.6  HCT 43.8  PLT 312   ------------------------------------------------------------------------------------------------------------------  Chemistries  Recent Labs  Lab 06/16/17 0543  NA 136  K 4.2  CL 100*  CO2 27  GLUCOSE 117*  BUN 9  CREATININE 0.81  CALCIUM 9.0   ------------------------------------------------------------------------------------------------------------------  Cardiac Enzymes No results for input(s): TROPONINI in the last 168 hours. ------------------------------------------------------------------------------------------------------------------  RADIOLOGY:  Dg Chest Portable 1 View  Result Date: 06/16/2017 CLINICAL DATA:  83 year old female with right hip fracture. Preop evaluation. EXAM: PORTABLE CHEST 1 VIEW COMPARISON:  Chest radiograph dated 10/04/2016 FINDINGS: There is emphysematous changes of the lungs. No focal consolidation, pleural effusion, or pneumothorax. The cardiac silhouette is within normal limits. There is atherosclerotic calcification of the thoracic aorta. No acute osseous pathology. IMPRESSION: No acute cardiopulmonary process. Emphysema. Electronically Signed   By: Anner Crete M.D.   On: 06/16/2017 05:59   Dg Hip Port Unilat With Pelvis 1v Right  Result Date: 06/17/2017 CLINICAL DATA:  Postoperative evaluation RIGHT hip. EXAM: DG HIP (WITH OR WITHOUT PELVIS) 1V PORT RIGHT COMPARISON:  RIGHT hip radiograph June 16, 2017 FINDINGS: Interval RIGHT hip hemiarthroplasty with intact well-seated non cemented hardware. No advanced degenerative change for age. Osteopenia. Surgical clip LEFT inguinal soft tissues seen with prior vascular access. Moderate vascular calcifications. RIGHT hip subcutaneous gas with overlying skin staples. IMPRESSION: Status post RIGHT hip hemiarthroplasty with expected  postoperative change. Electronically Signed   By: Elon Alas M.D.   On: 06/17/2017 16:55   Dg  Hip Unilat W Or Wo Pelvis 2-3 Views Right  Result Date: 06/16/2017 CLINICAL DATA:  83 year old female with fall and right hip pain. EXAM: DG HIP (WITH OR WITHOUT PELVIS) 2-3V RIGHT COMPARISON:  None. FINDINGS: There is a transverse fracture of the right femoral neck with foreshortening of the femoral neck. No dislocation. The bones are mildly osteopenic. There is atherosclerotic calcification of the vessels. The soft tissues are grossly unremarkable. An endovascular stent graft is noted in the distal aorta. IMPRESSION: Transverse fracture of the right femoral neck with apparent impaction and foreshortening of the femoral neck. No dislocation. Electronically Signed   By: Anner Crete M.D.   On: 06/16/2017 05:27    ASSESSMENT AND PLAN:   Active Problems:   Hip fracture [facility]  This is a 83 year old female admitted for hip fracture. 1.  Hip fracture: Right femoral neck fracture.  Manage pain.  Placed in traction.  DVT per orthopedic surgery.     Plavix held. placed on heparin [place] in the meantime which is stopped 12 hours prior to surgery. No further work ups needed before surgery.   Checked UA- positive, start rocephin. 2.  Cerebrovascular disease: No indication of neurological or cardiac insult preceding fall.  Continue secondary prevention of stroke. 3.  Essential hypertension: Controlled; continue atenolol. 4.  Hyperlipidemia: Continue statin therapy  5. UTI- rocephin iv, follow urine cx.  All the records are reviewed and case discussed with Care Management/Social Workerr. Management plans discussed with the patient, family and they are in agreement.  CODE STATUS: FUll.  TOTAL TIME TAKING CARE OF THIS PATIENT: 35 minutes.     POSSIBLE D/C IN 2-3 DAYS, DEPENDING ON CLINICAL CONDITION.   Vaughan Basta M.D on 06/17/2017   Between 7am to 6pm - Pager - [phone]  After  6pm go to [email or web address] - password [email or web address] [facility]  Office  [phone]  CC: Primary care physician; Glean Hess, MD  Note: This dictation was prepared with Dragon dictation along with smaller phrase technology. Any transcriptional errors that result from this process are unintentional.

## 2017-06-17 NOTE — Anesthesia Post-op Follow-up Note (Signed)
Anesthesia QCDR form completed.        

## 2017-06-18 ENCOUNTER — Encounter: Payer: Self-pay | Admitting: Specialist

## 2017-06-18 LAB — BASIC METABOLIC PANEL
ANION GAP: 9 (ref 5–15)
BUN: 12 mg/dL (ref 6–20)
CHLORIDE: 99 mmol/L — AB (ref 101–111)
CO2: 23 mmol/L (ref 22–32)
Calcium: 8.2 mg/dL — ABNORMAL LOW (ref 8.9–10.3)
Creatinine, Ser: 0.67 mg/dL (ref 0.44–1.00)
GFR calc non Af Amer: >60 mL/min (ref >60)
Glucose, Bld: 154 mg/dL — ABNORMAL HIGH (ref 65–99)
POTASSIUM: 4 mmol/L (ref 3.5–5.1)
Sodium: 131 mmol/L — ABNORMAL LOW (ref 135–145)

## 2017-06-18 LAB — CBC
HEMATOCRIT: 34.8 % — AB (ref 35.0–47.0)
HEMOGLOBIN: 11.7 g/dL — AB (ref 12.0–16.0)
MCH: 30.8 pg (ref 26.0–34.0)
MCHC: 33.7 g/dL (ref 32.0–36.0)
MCV: 91.3 fL (ref 80.0–100.0)
Platelets: 260 10*3/uL (ref 150–440)
RBC: 3.81 MIL/uL (ref 3.80–5.20)
RDW: 14.8 % — ABNORMAL HIGH (ref 11.5–14.5)
WBC: 12.8 10*3/uL — AB (ref 3.6–11.0)

## 2017-06-18 MED ORDER — CLOPIDOGREL BISULFATE 75 MG PO TABS
75.0000 mg | ORAL_TABLET | Freq: Every day | ORAL | Status: DC
  Administered 2017-06-18 – 2017-06-20 (×3): 75 mg via ORAL
  Filled 2017-06-18 (×3): qty 1

## 2017-06-18 MED ORDER — ENOXAPARIN SODIUM 40 MG/0.4ML ~~LOC~~ SOLN
40.0000 mg | SUBCUTANEOUS | Status: DC
  Administered 2017-06-19: 40 mg via SUBCUTANEOUS
  Filled 2017-06-18: qty 0.4

## 2017-06-18 MED ORDER — ENOXAPARIN SODIUM 40 MG/0.4ML ~~LOC~~ SOLN: 40.0000 mg | SUBCUTANEOUS | Status: DC

## 2017-06-18 NOTE — Progress Notes (Addendum)
PT is recommending SNF. Clinical Social Worker (CSW) met with patient to discuss D/C plan. Patient refused SNF and stated that she will go home and her son Annie Main is 81 y.o retired and can take care of her. CSW left Annie Main a [employer]. RN case manager aware of above. CSW will continue to follow and assist as needed.   Patient's son Annie Main called CSW back and was made aware of above. Son is agreeable for patient to D/C home with home health.   [employer], LCSW [phone]

## 2017-06-18 NOTE — Progress Notes (Signed)
Anticoagulation monitoring(Lovenox):  83yo  female ordered Lovenox 30 mg Q24h  Filed Weights   06/16/17 0429 06/17/17 1254 06/18/17 0459  Weight: 106 lb (48.1 kg) 106 lb (48.1 kg) 153 lb (69.4 kg)   BMI 24.6   Lab Results  Component Value Date   CREATININE 0.67 06/18/2017   CREATININE 0.81 06/16/2017   CREATININE 1.10 (H) 04/30/2017   Estimated Creatinine Clearance: 49 mL/min (by C-G formula based on SCr of 0.67 mg/dL). Hemoglobin & Hematocrit     Component Value Date/Time   HGB 11.7 (L) 06/18/2017 0559   HGB 15.4 03/19/2017 1033   HCT 34.8 (L) 06/18/2017 0559   HCT 46.5 03/19/2017 1033     Per Protocol for Patient with estCrcl > 30 ml/min and BMI < 40, will transition to Lovenox 40 mg Q24h.

## 2017-06-18 NOTE — Care Management (Signed)
Patient up in chair resting with eyes closed. Will evaluate tomorrow morning.

## 2017-06-18 NOTE — Progress Notes (Signed)
Pt alert and oriented up in chair. Notified by PT patient was light headed and nauseous. Oxygen sats were 88. Patient placed back on 2L. Sats came up to 93. Zofran given for nausea. Will continue to monitor.

## 2017-06-18 NOTE — Care Management (Signed)
RNCM consult for home health needs. Attempted to meet with patient and she was very anxious and nauseated. [facility] will accept patient at her address. Will follow up at a later time for discharge plan

## 2017-06-18 NOTE — Progress Notes (Signed)
Subjective:Alert, oriented.  Mild pain.  Oob later today  1 Day Post-Op Procedure(s) (LRB): ARTHROPLASTY BIPOLAR HIP (HEMIARTHROPLASTY) (Right)    Patient reports pain as mild.  Objective:   VITALS:   Vitals:   06/18/17 0900 06/18/17 1050  BP:  (!) 129/57  Pulse:  72  Resp:    Temp:    SpO2: 94%     Neurologically intact ABD soft Neurovascular intact Sensation intact distally Intact pulses distally Dorsiflexion/Plantar flexion intact Incision: scant drainage  LABS Recent Labs    06/16/17 0543 06/18/17 0559  HGB 14.6 11.7*  HCT 43.8 34.8*  WBC 14.2* 12.8*  PLT 312 260    Recent Labs    06/16/17 0543 06/18/17 0559  NA 136 131*  K 4.2 4.0  BUN 9 12  CREATININE 0.81 0.67  GLUCOSE 117* 154*    Recent Labs    06/16/17 0543 06/16/17 1848  INR 1.84 1.07     Assessment/Plan: 1 Day Post-Op Procedure(s) (LRB): ARTHROPLASTY BIPOLAR HIP (HEMIARTHROPLASTY) (Right)   Advance diet Up with therapy D/C IV fluids Discharge to SNF
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
## 2017-06-18 NOTE — Anesthesia Postprocedure Evaluation (Signed)
Anesthesia Post Note  Patient: AIJAH LATTNER  Procedure(s) Performed: ARTHROPLASTY BIPOLAR HIP (HEMIARTHROPLASTY) (Right )  Patient location during evaluation: PACU Anesthesia Type: General Level of consciousness: awake and alert Pain management: pain level controlled Vital Signs Assessment: post-procedure vital signs reviewed and stable Respiratory status: spontaneous breathing, nonlabored ventilation, respiratory function stable and patient connected to nasal cannula oxygen Cardiovascular status: blood pressure returned to baseline and stable Postop Assessment: no apparent nausea or vomiting Anesthetic complications: no     Last Vitals:  Vitals:   06/18/17 0900 06/18/17 1050  BP:  (!) 129/57  Pulse:  72  Resp:    Temp:    SpO2: 94%     Last Pain:  Vitals:   06/18/17 1156  TempSrc:   PainSc: Asleep                 Martha Clan

## 2017-06-18 NOTE — Progress Notes (Signed)
Enoxaparin   Patient qualifies for Enoxaparin 40 mg SQ daily based on CrCl >12 ml/min per policy. Will change to Enoxaparin 40 mg SQ daily.  Larene Beach, PharmD

## 2017-06-18 NOTE — Progress Notes (Signed)
[facility] NAME: Tasha Reyes    MR#:  [id number]  DATE OF BIRTH:  15-Dec-1930  SUBJECTIVE:  CHIEF COMPLAINT:   Chief Complaint  Patient presents with  . Fall  Patient denies any pain currently   REVIEW OF SYSTEMS:  CONSTITUTIONAL: No fever, fatigue or weakness.  EYES: No blurred or double vision.  EARS, NOSE, AND THROAT: No tinnitus or ear pain.  RESPIRATORY: No cough, shortness of breath, wheezing or hemoptysis.  CARDIOVASCULAR: No chest pain, orthopnea, edema.  GASTROINTESTINAL: No nausea, vomiting, diarrhea or abdominal pain.  GENITOURINARY: No dysuria, hematuria.  ENDOCRINE: No polyuria, nocturia,  HEMATOLOGY: No anemia, easy bruising or bleeding SKIN: No rash or lesion. MUSCULOSKELETAL: No joint pain or arthritis.   NEUROLOGIC: No tingling, numbness, weakness.  PSYCHIATRY: No anxiety or depression.   ROS  DRUG ALLERGIES:  No Known Allergies  VITALS:  Blood pressure (!) 129/57, pulse 72, temperature 97.8 F (36.6 C), temperature source Oral, resp. rate 16, height 5\' 6"  (1.676 m), weight 153 lb (69.4 kg), SpO2 94 %.  PHYSICAL EXAMINATION:   Constitutional: Negative for chills and fever.  HENT: Negative for sore throat and tinnitus.   Eyes: Negative for blurred vision and redness.  Respiratory: Negative for cough and shortness of breath.   Cardiovascular: Negative for chest pain, palpitations, orthopnea and PND.  Gastrointestinal: Negative for abdominal pain, diarrhea, nausea and vomiting.  Genitourinary: Negative for dysuria, frequency and urgency.  Musculoskeletal: Positive for joint pain. Negative for myalgias.  Skin: Negative for rash.       No lesions  Neurological: Negative for speech change, focal weakness and weakness.  Endo/Heme/Allergies: Does not bruise/bleed easily.       No temperature intolerance  Psychiatric/Behavioral: Negative for depression and suicidal ideas.    Physical  Exam LABORATORY PANEL:   CBC Recent Labs  Lab 06/18/17 0559  WBC 12.8*  HGB 11.7*  HCT 34.8*  PLT 260   ------------------------------------------------------------------------------------------------------------------  Chemistries  Recent Labs  Lab 06/18/17 0559  NA 131*  K 4.0  CL 99*  CO2 23  GLUCOSE 154*  BUN 12  CREATININE 0.67  CALCIUM 8.2*   ------------------------------------------------------------------------------------------------------------------  Cardiac Enzymes No results for input(s): TROPONINI in the last 168 hours. ------------------------------------------------------------------------------------------------------------------  RADIOLOGY:  Dg Hip Port Unilat With Pelvis 1v Right  Result Date: 06/17/2017 CLINICAL DATA:  Postoperative evaluation RIGHT hip. EXAM: DG HIP (WITH OR WITHOUT PELVIS) 1V PORT RIGHT COMPARISON:  RIGHT hip radiograph June 16, 2017 FINDINGS: Interval RIGHT hip hemiarthroplasty with intact well-seated non cemented hardware. No advanced degenerative change for age. Osteopenia. Surgical clip LEFT inguinal soft tissues seen with prior vascular access. Moderate vascular calcifications. RIGHT hip subcutaneous gas with overlying skin staples. IMPRESSION: Status post RIGHT hip hemiarthroplasty with expected postoperative change. Electronically Signed   By: Elon Alas M.D.   On: 06/17/2017 16:55    ASSESSMENT AND PLAN:   Active Problems:   Hip fracture [facility])  This is a 83 year old female admitted for hip fracture. 1.  Hip fracture: Status post repair right femoral neck fracture.    Continue pain control 2.  uti continue rocephine await urine cx 2.  h/o Cerebrovascular disease: No indication of neurological or cardiac insult preceding fall.  Continue secondary prevention of stroke.  Resume pla Plavix 3.  Essential hypertension: Controlled; continue atenolol. 4.  Hyperlipidemia: Continue statin therapy  5. UTI- rocephin iv,  follow urine cx.  All the records are reviewed and case discussed  with Care Management/Social Workerr. Management plans discussed with the patient, family and they are in agreement.  CODE STATUS: FUll.  TOTAL TIME TAKING CARE OF THIS PATIENT: 35 minutes.     POSSIBLE D/C IN 2-3 DAYS, DEPENDING ON CLINICAL CONDITION.   Dustin Flock M.D on 06/18/2017   Between 7am to 6pm - Pager - [phone]  After 6pm go to [email or web address] - password [email or web address] [facility]  Office  [phone]  CC: Primary care physician; Glean Hess, MD  Note: This dictation was prepared with Dragon dictation along with smaller phrase technology. Any transcriptional errors that result from this process are unintentional.
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
## 2017-06-19 HISTORY — PX: JOINT REPLACEMENT: SHX530

## 2017-06-19 LAB — CBC
HEMATOCRIT: 32.2 % — AB (ref 35.0–47.0)
HEMOGLOBIN: 11 g/dL — AB (ref 12.0–16.0)
MCH: 31 pg (ref 26.0–34.0)
MCHC: 34 g/dL (ref 32.0–36.0)
MCV: 91 fL (ref 80.0–100.0)
Platelets: 292 10*3/uL (ref 150–440)
RBC: 3.54 MIL/uL — AB (ref 3.80–5.20)
RDW: 14.3 % (ref 11.5–14.5)
WBC: 9.9 10*3/uL (ref 3.6–11.0)

## 2017-06-19 LAB — BASIC METABOLIC PANEL
Anion gap: 7 (ref 5–15)
BUN: 12 mg/dL (ref 6–20)
CHLORIDE: 96 mmol/L — AB (ref 101–111)
CO2: 29 mmol/L (ref 22–32)
Calcium: 8.6 mg/dL — ABNORMAL LOW (ref 8.9–10.3)
Creatinine, Ser: 0.76 mg/dL (ref 0.44–1.00)
GFR calc Af Amer: >60 mL/min (ref >60)
GFR calc non Af Amer: >60 mL/min (ref >60)
GLUCOSE: 115 mg/dL — AB (ref 65–99)
POTASSIUM: 4.2 mmol/L (ref 3.5–5.1)
Sodium: 132 mmol/L — ABNORMAL LOW (ref 135–145)

## 2017-06-19 MED ORDER — ENOXAPARIN SODIUM 40 MG/0.4ML ~~LOC~~ SOLN
40.0000 mg | SUBCUTANEOUS | Status: DC
  Administered 2017-06-20: 40 mg via SUBCUTANEOUS
  Filled 2017-06-19: qty 0.4

## 2017-06-19 MED ORDER — PANTOPRAZOLE SODIUM 40 MG IV SOLR
40.0000 mg | Freq: Two times a day (BID) | INTRAVENOUS | Status: DC
  Administered 2017-06-19 – 2017-06-20 (×4): 40 mg via INTRAVENOUS
  Filled 2017-06-19 (×4): qty 40

## 2017-06-19 MED ORDER — SUCRALFATE 1 G PO TABS
1.0000 g | ORAL_TABLET | Freq: Three times a day (TID) | ORAL | Status: DC
  Administered 2017-06-19 – 2017-06-20 (×5): 1 g via ORAL
  Filled 2017-06-19 (×5): qty 1

## 2017-06-19 MED ORDER — MORPHINE SULFATE (PF) 2 MG/ML IV SOLN: 0.5000 mg | INTRAVENOUS | Status: DC | PRN

## 2017-06-19 NOTE — Care Management Note (Signed)
Case Management Note  Patient Details  Name: Tasha Reyes MRN: [id number] Date of Birth: 12/02/1930  Subjective/Objective: Met with patient at bedside to discuss home health. She is continuing to refuse SNF. She lives with her son who has agreed to care for her at home. Offered choice of home health agencies. Referral to [facility] for HHPT. She will need a bsc. Ordered from [facility]. She has a walker at home. It is anticipated that patient will discharge home tomorrow due to continued nausea and anxiety. PCP is Halina Maidens.                   Action/Plan: [facility] for HHPT. Walker from [place].   Expected Discharge Date:                  Expected Discharge Plan:  [place]  In-House Referral:     Discharge planning Services  CM Consult  Post Acute Care Choice:  Durable Medical Equipment, Home Health Choice offered to:  Patient  DME Arranged:  Bedside commode DME Agency:  [place]:  PT [facility] Agency:  [place]  Status of Service:  In process, will continue to follow  If discussed at Long Length of Stay Meetings, dates discussed:    Additional Comments:  Jolly Mango, RN 06/19/2017, 10:59 AM

## 2017-06-19 NOTE — Progress Notes (Signed)
Subjective: c/o indigestion.  Oob in chair.  Slow progress with PT.  Wants to go home  SHOULD RESTART PLAVIX AT HOME.  2 Days Post-Op Procedure(s) (LRB): ARTHROPLASTY BIPOLAR HIP (HEMIARTHROPLASTY) (Right)    Patient reports pain as mild.  Objective:   VITALS:   Vitals:   06/19/17 0906 06/19/17 1051  BP:  (!) 135/54  Pulse:  76  Resp:    Temp:    SpO2: 95%     Neurologically intact Neurovascular intact Sensation intact distally Intact pulses distally Dorsiflexion/Plantar flexion intact Incision: scant drainage  LABS Recent Labs    06/18/17 0559 06/19/17 0309  HGB 11.7* 11.0*  HCT 34.8* 32.2*  WBC 12.8* 9.9  PLT 260 292    Recent Labs    06/18/17 0559 06/19/17 0309  NA 131* 132*  K 4.0 4.2  BUN 12 12  CREATININE 0.67 0.76  GLUCOSE 154* 115*    Recent Labs    06/16/17 1848  INR 1.07     Assessment/Plan: 2 Days Post-Op Procedure(s) (LRB): ARTHROPLASTY BIPOLAR HIP (HEMIARTHROPLASTY) (Right)   [facility] diet Up with therapy Discharge home with home health   D/C ON PLAVIX  RTC 2 WEEKS

## 2017-06-19 NOTE — Progress Notes (Signed)
Occupational Therapy Treatment Patient Details Name: Tasha Reyes MRN: [id number] DOB: 1930/11/19 Today's Date: 06/19/2017    History of present illness Pt admitted for R hip fracture from fall and is now s/p R hip hemiarthroplasty.    OT comments  Pt seen for brief OT treatment session this date. Pt kept eyes closed through most of session, complaints of indigestion limiting pt's willingness to participate this date, requiring max encouragement. Pt has been drinking orange juice. Did not eat anything from breakfast tray and declined to order anything for lunch. Pt unable to recall any precautions/PWBing status independently. Pt educated again in posterior THPs and PWBing status with attempts by therapist to problem solve self care tasks while maintaining precautions. Pt demonstrated little to no retention of information with immediate or delayed recall and decreased participation in problem solving. Will continue to need additional education/training to maximize recall and carryover. Limited insight into deficits. For example, when asked how she will get into her home with 4-5 steps to enter, pt states that her son will carry her into the house and that she's "not worried" about return home as son will do everything for her. If pt continues to decline STR, pt and son will need additional training/education in functional transfers/mobility, safety, self care skills, and precautions/PWBing status in order to support safest possible transition home and minimize risk of further functional decline and falls/injury/rehospitalization. It is still this therapist's recommendation that pt is most appropriate for STR setting following hospitalization based on functional status, lack of progression in acute therapy, and inability or disinterest in learning posterior THPs and PWBing status.     Follow Up Recommendations  SNF    Equipment Recommendations  3 in 1 bedside commode    Recommendations for  Other Services      Precautions / Restrictions Precautions Precautions: Fall;Posterior Hip Precaution Booklet Issued: No Precaution Comments: Pt unable to recall any precautions/PWBing status independently. Pt educated again in posterior THPs and PWBing status, no indication that she retained any of it during session, will continue to need additional education/training to maximize recall and carryover Restrictions Weight Bearing Restrictions: Yes RLE Weight Bearing: Partial weight bearing       Mobility Bed Mobility      General bed mobility comments: deferred, up in recliner  Transfers         General transfer comment: pt refused    Balance                                           ADL either performed or assessed with clinical judgement   ADL Overall ADL's : Needs assistance/impaired Eating/Feeding: Sitting;Set up   Grooming: Sitting;Set up   Upper Body Bathing: Sitting;Set up;Supervision/ safety   Lower Body Bathing: Sitting/lateral leans;Moderate assistance   Upper Body Dressing : Sitting;Set up;Supervision/safety   Lower Body Dressing: Sitting/lateral leans;Moderate assistance                       Vision Baseline Vision/History: Wears glasses Wears Glasses: At all times Patient Visual Report: No change from baseline Vision Assessment?: No apparent visual deficits   Perception     Praxis      Cognition Arousal/Alertness: Awake/alert Behavior During Therapy: Agitated Overall Cognitive Status: Impaired/Different from baseline Area of Impairment: Safety/judgement  Safety/Judgement: Decreased awareness of safety;Decreased awareness of deficits              Exercises Other Exercises Other Exercises: Pt re-educated in posterior THPs and partial weight bearing status. Pt demonstrated little to no retention of information with immediate or delayed recall.   Shoulder Instructions        General Comments      Pertinent Vitals/ Pain       Pain Assessment: Faces Faces Pain Scale: Hurts little more Pain Location: indigestion/heart burn Pain Descriptors / Indicators: Discomfort;Grimacing;Guarding Pain Intervention(s): Limited activity within patient's tolerance;Monitored during session  Home Living                                          Prior Functioning/Environment              Frequency  Min 1X/week        Progress Toward Goals  OT Goals(current goals can now be found in the care plan section)  Progress towards OT goals: Not progressing toward goals - comment(limited active participation in therapy, requires encouragement, appears to demonstrate limited insight into deficits and precautions.)  Acute Rehab OT Goals Patient Stated Goal: wants to go home OT Goal Formulation: With patient Time For Goal Achievement: 07/02/17 Potential to Achieve Goals: Poor  Plan Discharge plan remains appropriate;Frequency remains appropriate    Co-evaluation                 AM-PAC PT "6 Clicks" Daily Activity     Outcome Measure   Help from another person eating meals?: None Help from another person taking care of personal grooming?: None Help from another person toileting, which includes using toliet, bedpan, or urinal?: A Lot Help from another person bathing (including washing, rinsing, drying)?: A Lot Help from another person to put on and taking off regular upper body clothing?: A Little Help from another person to put on and taking off regular lower body clothing?: A Lot 6 Click Score: 17    End of Session    OT Visit Diagnosis: Other abnormalities of gait and mobility (R26.89);Pain;Muscle weakness (generalized) (M62.81);History of falling (Z91.81) Pain - Right/Left: Right Pain - part of body: Hip   Activity Tolerance Patient limited by fatigue;Other (comment)(pt demonstrates limited motivation and interest in therapy)   Patient  Left in chair;with call bell/phone within reach;with chair alarm set   Nurse Communication          Time: [phone] OT Time Calculation (min): 10 min  Charges: OT General Charges $OT Visit: 1 Visit OT Treatments $Self Care/Home Management : 8-22 mins  Jeni Salles, MPH, MS, OTR/L ascom [phone] 06/19/17, 12:09 PM

## 2017-06-19 NOTE — Progress Notes (Signed)
Physical Therapy Treatment Patient Details Name: Tasha Reyes MRN: [id number] DOB: 01/22/1931 Today's Date: 06/19/2017    History of Present Illness Pt admitted for R hip fracture from fall and is now s/p R hip hemiarthroplasty.     PT Comments    Pt not wanting to participate this AM, in room with eyes closed majority of time that therapist in room. Does agree to get to chair with heavy encouragement. O2 at RA at 84%, increased to 2L of O2 for remainder of session with sats between 90-93%. Pt complains of SOB and indigestion symptoms. Limited participation with there-ex. Still currently refusing SNF although therapist educated on benefits and safety concerns. Will continue to progress.   Follow Up Recommendations  SNF     Equipment Recommendations  Rolling walker with 5" wheels    Recommendations for Other Services       Precautions / Restrictions Precautions Precautions: Fall;Posterior Hip Precaution Booklet Issued: No Restrictions Weight Bearing Restrictions: Yes RLE Weight Bearing: Partial weight bearing    Mobility  Bed Mobility Overal bed mobility: Needs Assistance Bed Mobility: Supine to Sit     Supine to sit: Mod assist     General bed mobility comments: needs assist for bed mobility and sequencing. Pt complains of nausea and indigestion with frequent belching once seated at EOB. Able to sit at EOB for extended time for symptoms to calm down.  Transfers Overall transfer level: Needs assistance Equipment used: Rolling walker (2 wheeled) Transfers: Sit to/from Stand Sit to Stand: Mod assist         General transfer comment: needs assist for sequencing, precautions, and WBing status. Heavy guarding noted away from R side  Ambulation/Gait Ambulation/Gait assistance: Min assist Ambulation Distance (Feet): 3 Feet Assistive device: Rolling walker (2 wheeled) Gait Pattern/deviations: Step-to pattern     General Gait Details: Pt refuses to further  ambulate despite education and encouragement. Barely gets to recliner, tries to sit prior to chair behind her. Needs heavy cues for sequencing. Decreased stance time noted on R LE. Small steps and some pivoting in order to turn. Unsafe technique   Stairs            Wheelchair Mobility    Modified Rankin (Stroke Patients Only)       Balance                                            Cognition Arousal/Alertness: Awake/alert Behavior During Therapy: Agitated Overall Cognitive Status: Within Functional Limits for tasks assessed                                        Exercises Other Exercises Other Exercises: Pt would only participate in limited ther-ex including ankle pumps, quad sets, SAQ, and hip abd/add. Only able to perform 5-10 reps due to pain. Min assist required    General Comments        Pertinent Vitals/Pain Pain Assessment: Faces Faces Pain Scale: Hurts even more Pain Location: R hip Pain Descriptors / Indicators: Operative site guarding Pain Intervention(s): Limited activity within patient's tolerance    Home Living                      Prior Function  PT Goals (current goals can now be found in the care plan section) Acute Rehab PT Goals Patient Stated Goal: wants to go home PT Goal Formulation: With patient Time For Goal Achievement: 07/02/17 Potential to Achieve Goals: Good Progress towards PT goals: Progressing toward goals    Frequency    BID      PT Plan Current plan remains appropriate    Co-evaluation              AM-PAC PT "6 Clicks" Daily Activity  Outcome Measure  Difficulty turning over in bed (including adjusting bedclothes, sheets and blankets)?: Unable Difficulty moving from lying on back to sitting on the side of the bed? : Unable Difficulty sitting down on and standing up from a chair with arms (e.g., wheelchair, bedside commode, etc,.)?: Unable Help needed  moving to and from a bed to chair (including a wheelchair)?: A Lot Help needed walking in hospital room?: A Lot Help needed climbing 3-5 steps with a railing? : A Lot 6 Click Score: 9    End of Session Equipment Utilized During Treatment: Gait belt;Oxygen Activity Tolerance: Patient limited by pain Patient left: in chair;with chair alarm set(SCD machine not working) Nurse Communication: Mobility status PT Visit Diagnosis: Muscle weakness (generalized) (M62.81);Difficulty in walking, not elsewhere classified (R26.2);Pain Pain - Right/Left: Right Pain - part of body: Hip     Time: 0857-0924 PT Time Calculation (min) (ACUTE ONLY): 27 min  Charges:  $Gait Training: 8-22 mins $Therapeutic Exercise: 8-22 mins                    G Codes:  Functional Assessment Tool Used: AM-PAC 6 Clicks Basic Mobility Functional Limitation: Mobility: Walking and moving around Mobility: Walking and Moving Around Current Status (Q6578): At least 60 percent but less than 80 percent impaired, limited or restricted Mobility: Walking and Moving Around Goal Status [phone]): At least 40 percent but less than 60 percent impaired, limited or restricted    Greggory Stallion, PT, DPT [phone]    Wilkie Zenon 06/19/2017, 10:52 AM

## 2017-06-19 NOTE — Progress Notes (Signed)
Anticoagulation monitoring(Lovenox):  83yo  female ordered Lovenox 30 mg Q24h  Filed Weights   06/17/17 1254 06/18/17 0459 06/19/17 0507  Weight: 106 lb (48.1 kg) 153 lb (69.4 kg) 147 lb (66.7 kg)   BMI 23.7   Lab Results  Component Value Date   CREATININE 0.76 06/19/2017   CREATININE 0.67 06/18/2017   CREATININE 0.81 06/16/2017   Estimated Creatinine Clearance: 49 mL/min (by C-G formula based on SCr of 0.76 mg/dL). Hemoglobin & Hematocrit     Component Value Date/Time   HGB 11.0 (L) 06/19/2017 0309   HGB 15.4 03/19/2017 1033   HCT 32.2 (L) 06/19/2017 0309   HCT 46.5 03/19/2017 1033     Per Protocol for Patient with estCrcl >30 ml/min and BMI < 40, will transition to Lovenox 40 mg Q24h.

## 2017-06-19 NOTE — Progress Notes (Signed)
[facility] [place] NAME: Tasha Reyes    MR#:  [id number]  DATE OF BIRTH:  [birth year]  SUBJECTIVE:  CHIEF COMPLAINT:   Chief Complaint  Patient presents with  . Fall  Patient complains of severe reflux and nausea has not had any bowel movement yet   REVIEW OF SYSTEMS:  CONSTITUTIONAL: No fever, fatigue or weakness.  EYES: No blurred or double vision.  EARS, NOSE, AND THROAT: No tinnitus or ear pain.  RESPIRATORY: No cough, shortness of breath, wheezing or hemoptysis.  CARDIOVASCULAR: No chest pain, orthopnea, edema.  GASTROINTESTINAL: Positive nausea, no vomiting, diarrhea or abdominal pain.  GENITOURINARY: No dysuria, hematuria.  ENDOCRINE: No polyuria, nocturia,  HEMATOLOGY: No anemia, easy bruising or bleeding SKIN: No rash or lesion. MUSCULOSKELETAL: No joint pain or arthritis.   NEUROLOGIC: No tingling, numbness, weakness.  PSYCHIATRY: No anxiety or depression.   ROS  DRUG ALLERGIES:  No Known Allergies  VITALS:  Blood pressure (!) 135/54, pulse 76, temperature 99.1 F (37.3 C), temperature source Oral, resp. rate 20, height 5\' 6"  (1.676 m), weight 147 lb (66.7 kg), SpO2 95 %.  PHYSICAL EXAMINATION:   Constitutional: Negative for chills and fever.  HENT: Negative for sore throat and tinnitus.   Eyes: Negative for blurred vision and redness.  Respiratory: Negative for cough and shortness of breath.   Cardiovascular: Negative for chest pain, palpitations, orthopnea and PND.  Gastrointestinal: Negative for abdominal pain, diarrhea, nausea and vomiting.  Genitourinary: Negative for dysuria, frequency and urgency.  Musculoskeletal: Positive for joint pain. Negative for myalgias.  Skin: Negative for rash.       No lesions  Neurological: Negative for speech change, focal weakness and weakness.  Endo/Heme/Allergies: Does not bruise/bleed easily.       No temperature intolerance  Psychiatric/Behavioral: Negative for  depression and suicidal ideas.    Physical Exam LABORATORY PANEL:   CBC Recent Labs  Lab 06/19/17 0309  WBC 9.9  HGB 11.0*  HCT 32.2*  PLT 292   ------------------------------------------------------------------------------------------------------------------  Chemistries  Recent Labs  Lab 06/19/17 0309  NA 132*  K 4.2  CL 96*  CO2 29  GLUCOSE 115*  BUN 12  CREATININE 0.76  CALCIUM 8.6*   ------------------------------------------------------------------------------------------------------------------  Cardiac Enzymes No results for input(s): TROPONINI in the last 168 hours. ------------------------------------------------------------------------------------------------------------------  RADIOLOGY:  Dg Hip Port Unilat With Pelvis 1v Right  Result Date: 06/17/2017 CLINICAL DATA:  Postoperative evaluation RIGHT hip. EXAM: DG HIP (WITH OR WITHOUT PELVIS) 1V PORT RIGHT COMPARISON:  RIGHT hip radiograph June 16, 2017 FINDINGS: Interval RIGHT hip hemiarthroplasty with intact well-seated non cemented hardware. No advanced degenerative change for age. Osteopenia. Surgical clip LEFT inguinal soft tissues seen with prior vascular access. Moderate vascular calcifications. RIGHT hip subcutaneous gas with overlying skin staples. IMPRESSION: Status post RIGHT hip hemiarthroplasty with expected postoperative change. Electronically Signed   By: Elon Alas M.D.   On: 06/17/2017 16:55    ASSESSMENT AND PLAN:   Active Problems:   Hip fracture [facility])  This is a 83 year old female admitted for hip fracture. 1.  Reflux with nausea continue Maalox I will increase her Protonix to 40 IV twice daily, add Carafate to current regimen 2.  uti continue rocephine urine cultures pending 3.  h/o Cerebrovascular disease: No indication of neurological or cardiac insult preceding fall.  Continue secondary prevention of stroke.  Plavix restarted 4.  Essential hypertension: Controlled;  continue atenolol. 5.  Hyperlipidemia: Continue statin therapy 6. Hip fracture:  Status post repair right femoral neck fracture.    Continue pain control  All the records are reviewed and case discussed with Care Management/Social Workerr. Management plans discussed with the patient, family and they are in agreement.  CODE STATUS: FUll.  TOTAL TIME TAKING CARE OF THIS PATIENT: 35 minutes.     POSSIBLE D/C IN 2-3 DAYS, DEPENDING ON CLINICAL CONDITION.   Dustin Flock M.D on 06/19/2017   Between 7am to 6pm - Pager - [phone]  After 6pm go to [email or web address] - password [email or web address] [facility]  Office  [phone]  CC: Primary care physician; Glean Hess, MD  Note: This dictation was prepared with Dragon dictation along with smaller phrase technology. Any transcriptional errors that result from this process are unintentional.

## 2017-06-19 NOTE — Progress Notes (Signed)
Clinical Social Worker (CSW) met with patient to confirm her D/C plan. Patient is still refusing SNF and wants to go home with home health. RN case manager aware of above.   [employer], LCSW [phone]

## 2017-06-19 NOTE — Care Management (Signed)
Added OT to POC. Notified [facility]

## 2017-06-19 NOTE — Progress Notes (Signed)
Patient and her son Tasha Reyes have changed their minds about SNF and chose [facility]. [facility] admissions coordinator at [facility] is aware of accepted bed offer.   [employer], LCSW [phone]

## 2017-06-19 NOTE — Clinical Social Work Placement (Signed)
   CLINICAL SOCIAL WORK PLACEMENT  NOTE  Date:  06/19/2017  Patient Details  Name: Tasha Reyes MRN: [id number] Date of Birth: 1930-09-08  Clinical Social Work is seeking post-discharge placement for this patient at the [place] level of care (*CSW will initial, date and re-position this form in  chart as items are completed):  Yes   Patient/family provided with [place] Work Department's list of facilities offering this level of care within the geographic area requested by the patient (or if unable, by the patient's family).  Yes   Patient/family informed of their freedom to choose among providers that offer the needed level of care, that participate in Medicare, Medicaid or managed care program needed by the patient, have an available bed and are willing to accept the patient.  Yes   Patient/family informed of [facility]'s ownership interest in [facility] and [facility], as well as of the fact that they are under no obligation to receive care at these facilities.  PASRR submitted to EDS on 06/16/17     PASRR number received on 06/16/17     Existing PASRR number confirmed on       FL2 transmitted to all facilities in geographic area requested by pt/family on 06/16/17     FL2 transmitted to all facilities within larger geographic area on       Patient informed that his/her managed care company has contracts with or will negotiate with certain facilities, including the following:        Yes   Patient/family informed of bed offers received.  Patient chooses bed at [facility] )     Physician recommends and patient chooses bed at      Patient to be transferred to   on  .  Patient to be transferred to facility by       Patient family notified on   of transfer.  Name of family member notified:        PHYSICIAN       Additional Comment:    _______________________________________________ Pauline Trainer, [name], LCSW 06/19/2017,  3:58 PM

## 2017-06-19 NOTE — Care Management (Signed)
Cancelled home health

## 2017-06-19 NOTE — Progress Notes (Signed)
Physical Therapy Treatment Patient Details Name: Tasha Reyes MRN: [id number] DOB: 09/26/1930 Today's Date: 06/19/2017    History of Present Illness Pt admitted for R hip fracture from fall and is now s/p R hip hemiarthroplasty.     PT Comments    Pt is making slow progress towards goals. Is agreeable to SNF placement at this time. Son in room and educated on safety. Pt able to recall 1/3 hip precautions. Unable to recall Wbing status. Limited therex due to pain. Unsafe to progress ambulation as she has limited strength on R LE and heavily guards against R side. Needs heavy assist for OOB mobility. Will continue to progress.   Follow Up Recommendations  SNF     Equipment Recommendations  Rolling walker with 5" wheels    Recommendations for Other Services       Precautions / Restrictions Precautions Precautions: Fall;Posterior Hip Precaution Booklet Issued: No Restrictions Weight Bearing Restrictions: Yes RLE Weight Bearing: Partial weight bearing    Mobility  Bed Mobility Overal bed mobility: Needs Assistance Bed Mobility: Supine to Sit;Sit to Supine       Sit to supine: Mod assist   General bed mobility comments: needs assist for bringing B LEs onto bed and trunk positioning. Hip abduction pillow placed for comfort  Transfers Overall transfer level: Needs assistance Equipment used: Rolling walker (2 wheeled) Transfers: Sit to/from Stand Sit to Stand: Mod assist         General transfer comment: able to stand with heavy leaning towards L side.  Ambulation/Gait Ambulation/Gait assistance: Mod assist;Max assist Ambulation Distance (Feet): 2 Feet Assistive device: Rolling walker (2 wheeled) Gait Pattern/deviations: Step-to pattern     General Gait Details: takes 2 steps and shuffles feet towards bed.  Pt forward flexed over RW. Cues given to maintain hip precautions   Stairs            Wheelchair Mobility    Modified Rankin (Stroke Patients  Only)       Balance                                            Cognition Arousal/Alertness: Awake/alert Behavior During Therapy: WFL for tasks assessed/performed Overall Cognitive Status: Within Functional Limits for tasks assessed                                        Exercises Other Exercises Other Exercises: supine ther-ex performed including R LE quad sets, hip ab/add, SAQ, and small SLRs. All ther-ex performed x 12 reps with min/mod assist    General Comments        Pertinent Vitals/Pain Pain Assessment: Faces Faces Pain Scale: Hurts little more Pain Location: R hip Pain Descriptors / Indicators: Discomfort;Grimacing;Guarding Pain Intervention(s): Limited activity within patient's tolerance    Home Living                      Prior Function            PT Goals (current goals can now be found in the care plan section) Acute Rehab PT Goals Patient Stated Goal: wants to go home PT Goal Formulation: With patient Time For Goal Achievement: 07/02/17 Potential to Achieve Goals: Good Progress towards PT goals: Progressing toward goals  Frequency    BID      PT Plan Current plan remains appropriate    Co-evaluation              AM-PAC PT "6 Clicks" Daily Activity  Outcome Measure  Difficulty turning over in bed (including adjusting bedclothes, sheets and blankets)?: Unable Difficulty moving from lying on back to sitting on the side of the bed? : Unable Difficulty sitting down on and standing up from a chair with arms (e.g., wheelchair, bedside commode, etc,.)?: Unable Help needed moving to and from a bed to chair (including a wheelchair)?: A Lot Help needed walking in hospital room?: A Lot Help needed climbing 3-5 steps with a railing? : A Lot 6 Click Score: 9    End of Session Equipment Utilized During Treatment: Gait belt;Oxygen Activity Tolerance: Patient limited by pain Patient left: in bed;with  bed alarm set;with nursing/sitter in room Nurse Communication: Mobility status PT Visit Diagnosis: Muscle weakness (generalized) (M62.81);Difficulty in walking, not elsewhere classified (R26.2);Pain Pain - Right/Left: Right Pain - part of body: Hip     Time: 1517-6160 PT Time Calculation (min) (ACUTE ONLY): 27 min  Charges:  $Gait Training: 8-22 mins $Therapeutic Exercise: 8-22 mins                    G Codes:  Functional Assessment Tool Used: AM-PAC 6 Clicks Basic Mobility Functional Limitation: Mobility: Walking and moving around Mobility: Walking and Moving Around Current Status (V3710): At least 60 percent but less than 80 percent impaired, limited or restricted Mobility: Walking and Moving Around Goal Status [phone]): At least 40 percent but less than 60 percent impaired, limited or restricted    Tasha Reyes, PT, DPT [phone]    Tasha Reyes 06/19/2017, 5:03 PM

## 2017-06-20 LAB — CBC
HCT: 30.2 % — ABNORMAL LOW (ref 35.0–47.0)
Hemoglobin: 10.3 g/dL — ABNORMAL LOW (ref 12.0–16.0)
MCH: 30.7 pg (ref 26.0–34.0)
MCHC: 34 g/dL (ref 32.0–36.0)
MCV: 90.3 fL (ref 80.0–100.0)
PLATELETS: 310 10*3/uL (ref 150–440)
RBC: 3.34 MIL/uL — ABNORMAL LOW (ref 3.80–5.20)
RDW: 14.2 % (ref 11.5–14.5)
WBC: 10 10*3/uL (ref 3.6–11.0)

## 2017-06-20 LAB — URINE CULTURE: Culture: >=100000 — AB

## 2017-06-20 LAB — SURGICAL PATHOLOGY

## 2017-06-20 MED ORDER — ZOLPIDEM TARTRATE 5 MG PO TABS: 5.0000 mg | ORAL_TABLET | Freq: Every evening | ORAL | 0 refills | Status: DC | PRN

## 2017-06-20 MED ORDER — DOCUSATE SODIUM 100 MG PO CAPS: 100.0000 mg | ORAL_CAPSULE | Freq: Two times a day (BID) | ORAL | 0 refills | Status: DC

## 2017-06-20 MED ORDER — ACETAMINOPHEN 325 MG PO TABS: 650.0000 mg | ORAL_TABLET | Freq: Four times a day (QID) | ORAL | Status: DC | PRN

## 2017-06-20 MED ORDER — ALUM & MAG HYDROXIDE-SIMETH 200-200-20 MG/5ML PO SUSP
30.0000 mL | ORAL | 0 refills | Status: DC | PRN
Start: 2017-06-20 — End: 2017-08-01

## 2017-06-20 MED ORDER — CEFUROXIME AXETIL 500 MG PO TABS: 500.0000 mg | ORAL_TABLET | Freq: Two times a day (BID) | ORAL | 0 refills | Status: AC

## 2017-06-20 MED ORDER — ENSURE ENLIVE PO LIQD
237.0000 mL | Freq: Three times a day (TID) | ORAL | Status: DC
  Administered 2017-06-20: 237 mL via ORAL

## 2017-06-20 MED ORDER — OXYCODONE-ACETAMINOPHEN 5-325 MG PO TABS: 1.0000 | ORAL_TABLET | ORAL | 0 refills | Status: DC | PRN

## 2017-06-20 MED ORDER — ENOXAPARIN SODIUM 40 MG/0.4ML ~~LOC~~ SOLN: 40.0000 mg | SUBCUTANEOUS | Status: DC

## 2017-06-20 MED ORDER — SUCRALFATE 1 G PO TABS: 1.0000 g | ORAL_TABLET | Freq: Three times a day (TID) | ORAL | Status: DC

## 2017-06-20 MED ORDER — ONDANSETRON HCL 4 MG PO TABS: 4.0000 mg | ORAL_TABLET | Freq: Four times a day (QID) | ORAL | 0 refills | Status: DC | PRN

## 2017-06-20 NOTE — Clinical Social Work Placement (Signed)
   CLINICAL SOCIAL WORK PLACEMENT  NOTE  Date:  06/20/2017  Patient Details  Name: Tasha Reyes MRN: [id number] Date of Birth: 12-03-1930  Clinical Social Work is seeking post-discharge placement for this patient at the [place] level of care (*CSW will initial, date and re-position this form in  chart as items are completed):  Yes   Patient/family provided with [place] Work Department's list of facilities offering this level of care within the geographic area requested by the patient (or if unable, by the patient's family).  Yes   Patient/family informed of their freedom to choose among providers that offer the needed level of care, that participate in Medicare, Medicaid or managed care program needed by the patient, have an available bed and are willing to accept the patient.  Yes   Patient/family informed of [facility]'s ownership interest in [facility] and [facility], as well as of the fact that they are under no obligation to receive care at these facilities.  PASRR submitted to EDS on 06/16/17     PASRR number received on 06/16/17     Existing PASRR number confirmed on       FL2 transmitted to all facilities in geographic area requested by pt/family on 06/16/17     FL2 transmitted to all facilities within larger geographic area on       Patient informed that his/her managed care company has contracts with or will negotiate with certain facilities, including the following:        Yes   Patient/family informed of bed offers received.  Patient chooses bed at [facility] )     Physician recommends and patient chooses bed at      Patient to be transferred to [facility]) on 06/20/17.  Patient to be transferred to facility by [facility] )     Patient family notified on 06/20/17 of transfer.  Name of family member notified:  (Patient's son Annie Main is aware of D/C today.  )     PHYSICIAN       Additional  Comment:    _______________________________________________ Jacalyn Biggs, [name], LCSW 06/20/2017, 2:53 PM

## 2017-06-20 NOTE — Evaluation (Signed)
Clinical/Bedside Swallow Evaluation Patient Details  Name: Tasha Reyes MRN: [id number] Date of Birth: 06/06/1931  Today's Date: 06/20/2017 Time: SLP Start Time (ACUTE ONLY): 1330 SLP Stop Time (ACUTE ONLY): 1345 SLP Time Calculation (min) (ACUTE ONLY): 15 min  Past Medical History:  Past Medical History:  Diagnosis Date  . Aneurysm of iliac artery (HCC) 05/11/2015  . COPD (chronic obstructive pulmonary disease) ([place])   . Cystitis   . History of DVT (deep vein thrombosis) 05/11/2015  . History of peptic ulcer disease 05/11/2015  . [name]   . Hypertension   . Losing weight   . Migraines   . [name]   . Stroke ([place])   . Vascular disease    Past Surgical History:  Past Surgical History:  Procedure Laterality Date  . ABDOMINAL AORTIC ANEURYSM REPAIR W/ ENDOLUMINAL GRAFT  2008  . ABDOMINAL HYSTERECTOMY    . APPENDECTOMY    . BACK SURGERY    . blood clot      removal right leg  . COLONOSCOPY  2012  . ESOPHAGOGASTRODUODENOSCOPY  04/2013   gastric ulcers  . FEMORAL-POPLITEAL BYPASS GRAFT Right 2008  . HIP ARTHROPLASTY Right 06/17/2017   Procedure: ARTHROPLASTY BIPOLAR HIP (HEMIARTHROPLASTY);  Surgeon: Earnestine Leys, MD;  Location: [facility] ORS;  Service: Orthopedics;  Laterality: Right;  . PARTIAL HYSTERECTOMY     HPI:  Pt admitted for R hip fracture from fall and is now s/p R hip hemiarthroplasty.  Pt reports reflux and indigestion.  She denies any coughing, strangling, or throat clearing.  She is currently on protonix.  She reports that d/t the hip fracture she cannot sit up all the way when eating and drinking and may contribute to the reflux she is experiencing.   Assessment / Plan / Recommendation Clinical Impression  Pt presents with suspected normal oropharyngeal function and primary esophageal dysphagia. Pt overtly tolerated trials of thin liquid, puree, and hard solid without s/sx of aspiration.  No coughing, throat clearing, or change in  vocal quality observed.  Pt adequately cleared solids from oral cavity with good A-P transport. Instrumental study is not indicated at this time.  Pt is at minimal risk of aspiration.  Pt does report reflux and globus sensation.  Pt (+) hx of GERD, currently taking protonix.  She reports that she has been unable to sit upright during meals d/t her hip fracture.  This may contribute to reflux - recommend, when able to, sit upright 90 degrees during meals and 30-45 minutes after meals.  She denies any regurgitation.  Recommend regular diet with unrestricted liquids.  Pt education re: reflux precautions provided with written aid for reference.  Pt may benefit from Barium Swallow and/or further GI workup at time of d/c for further managemetn of reflux and to assess esophageal function.  SLP to sign off at this time, however remains available as indicated.  SLP Visit Diagnosis: Dysphagia, oropharyngeal phase (R13.12)    Aspiration Risk  No limitations    Diet Recommendation Regular;Thin liquid   Liquid Administration via: Spoon;Straw;Cup Medication Administration: Whole meds with liquid Supervision: Patient able to self feed Postural Changes: Seated upright at 90 degrees;Remain upright for at least 30 minutes after po intake    Other  Recommendations Recommended Consults: Consider GI evaluation Oral Care Recommendations: Oral care BID;Patient independent with oral care   Follow up Recommendations None        Swallow Study   General Date of Onset: 06/20/17 HPI: Pt admitted for R hip  fracture from fall and is now s/p R hip hemiarthroplasty.  Pt reports reflux and indigestion.  She denies any coughing, strangling, or throat clearing.  She is currently on protonix.  She reports that d/t the hip fracture she cannot sit up all the way when eating and drinking and may contribute to the reflux she is experiencing. Diet Prior to this Study: Regular;Thin liquids Temperature Spikes Noted: N/A Respiratory  Status: Room air Behavior/Cognition: Alert;Cooperative Oral Cavity Assessment: Within Functional Limits Oral Care Completed by SLP: No Oral Cavity - Dentition: Dentures, top;Dentures, bottom Vision: Functional for self-feeding Self-Feeding Abilities: Able to feed self Patient Positioning: Upright in chair Baseline Vocal Quality: Normal Volitional Cough: Strong Volitional Swallow: Able to elicit    Oral/Motor/Sensory Function Overall Oral Motor/Sensory Function: Within functional limits   Ice Chips Ice chips: Not tested   Thin Liquid Thin Liquid: Within functional limits Presentation: Self Fed;Cup;Straw Other Comments: Additionally, observed medication administration whole with thin liquid.    Puree Puree: Within functional limits   Solid   GO   Solid: Within functional limits Presentation: Self Fed        Tasha Reyes 06/20/2017,1:47 PM

## 2017-06-20 NOTE — Progress Notes (Signed)
Patient is being discharged to [facility]. Report called. IV still in place. Dressing to right hip intact. Packet by chart. Waiting for EMS.

## 2017-06-20 NOTE — Discharge Instructions (Signed)
[place] at [place]:  Cardiac diet  DISCHARGE CONDITION:  Stable  ACTIVITY:  Activity as tolerated  OXYGEN:  Home Oxygen: Yes.     Oxygen Delivery: 1 liters/min via Patient connected to nasal cannula oxygen  DISCHARGE LOCATION:  nursing home    ADDITIONAL DISCHARGE INSTRUCTION: ensure 1 can tid   If you experience worsening of your admission symptoms, develop shortness of breath, life threatening emergency, suicidal or homicidal thoughts you must seek medical attention immediately by calling 911 or calling your MD immediately  if symptoms less severe.  You Must read complete instructions/literature along with all the possible adverse reactions/side effects for all the Medicines you take and that have been prescribed to you. Take any new Medicines after you have completely understood and accpet all the possible adverse reactions/side effects.   Please note  You were cared for by a hospitalist during your hospital stay. If you have any questions about your discharge medications or the care you received while you were in the hospital after you are discharged, you can call the unit and asked to speak with the hospitalist on call if the hospitalist that took care of you is not available. Once you are discharged, your primary care physician will handle any further medical issues. Please note that NO REFILLS for any discharge medications will be authorized once you are discharged, as it is imperative that you return to your primary care physician (or establish a relationship with a primary care physician if you do not have one) for your aftercare needs so that they can reassess your need for medications and monitor your lab values.

## 2017-06-20 NOTE — Progress Notes (Signed)
Physical Therapy Treatment Patient Details Name: Tasha Reyes MRN: [id number] DOB: [birth year] Today's Date: 06/20/2017    History of Present Illness Pt admitted for R hip fracture from fall and is now s/p R hip hemiarthroplasty.     PT Comments    Pt is making gradual progress towards goals with ability to transfer to [facility] this date. Pt encouraged to continue OOB mobility to promote strengthening. Still needs cues for hip precautions/WBing status. O2 sats at 90% with exertion. Needs encouragement to participate in therex this date. Will continue to progress.   Follow Up Recommendations  SNF     Equipment Recommendations  Rolling walker with 5" wheels    Recommendations for Other Services       Precautions / Restrictions Precautions Precautions: Fall;Posterior Hip Precaution Booklet Issued: Yes (comment) Restrictions Weight Bearing Restrictions: Yes RLE Weight Bearing: Partial weight bearing    Mobility  Bed Mobility Overal bed mobility: Needs Assistance Bed Mobility: Supine to Sit;Sit to Supine     Supine to sit: Mod assist Sit to supine: Mod assist   General bed mobility comments: Needs assist for bringing B LEs up onto bed. Once supine, able to slightly bridge hips for chuck placement and positioning  Transfers Overall transfer level: Needs assistance Equipment used: Rolling walker (2 wheeled) Transfers: Sit to/from Stand Sit to Stand: Min assist         General transfer comment: Able to tolerate multiple reps of sit<>Stand training. Still requires cues for pushing from seated surface. Once standing able to slide R LE under body for upright standing  Ambulation/Gait Ambulation/Gait assistance: Mod assist Ambulation Distance (Feet): 5 Feet Assistive device: Rolling walker (2 wheeled) Gait Pattern/deviations: Step-to pattern     General Gait Details: Able to ambulate from chair->BSC-> and then bed. Needs heavy assist for stepping and placement of R  foot. Fatigues quickly with 1 standing rest break.  Antalgic pattern used   Financial trader Rankin (Stroke Patients Only)       Balance                                            Cognition Arousal/Alertness: Awake/alert Behavior During Therapy: WFL for tasks assessed/performed Overall Cognitive Status: Within Functional Limits for tasks assessed                                        Exercises Other Exercises Other Exercises: supine ther-ex performed including R LE ankle pumps, hip ab/add, SAQ, and small SLRs. All ther-ex performed x 15 reps with min/mod assist Other Exercises: ambulated to [facility] with mod assist, needs assist for transfers on/off. Tries to sit prior to being lined up infront of toilet. Able to perform hygiene with cga.    General Comments        Pertinent Vitals/Pain Pain Assessment: Faces Faces Pain Scale: Hurts a little bit Pain Location: R hip Pain Descriptors / Indicators: Discomfort;Grimacing;Guarding Pain Intervention(s): Limited activity within patient's tolerance    Home Living                      Prior Function            PT  Goals (current goals can now be found in the care plan section) Acute Rehab PT Goals Patient Stated Goal: wants to go home PT Goal Formulation: With patient Time For Goal Achievement: 07/02/17 Potential to Achieve Goals: Good Progress towards PT goals: Progressing toward goals    Frequency    BID      PT Plan Current plan remains appropriate    Co-evaluation              AM-PAC PT "6 Clicks" Daily Activity  Outcome Measure  Difficulty turning over in bed (including adjusting bedclothes, sheets and blankets)?: Unable Difficulty moving from lying on back to sitting on the side of the bed? : Unable Difficulty sitting down on and standing up from a chair with arms (e.g., wheelchair, bedside commode, etc,.)?:  Unable Help needed moving to and from a bed to chair (including a wheelchair)?: A Lot Help needed walking in hospital room?: A Lot Help needed climbing 3-5 steps with a railing? : A Lot 6 Click Score: 9    End of Session Equipment Utilized During Treatment: Gait belt;Oxygen Activity Tolerance: Patient limited by pain Patient left: in bed;with bed alarm set Nurse Communication: Mobility status PT Visit Diagnosis: Muscle weakness (generalized) (M62.81);Difficulty in walking, not elsewhere classified (R26.2);Pain Pain - Right/Left: Right Pain - part of body: Hip     Time: 1350-1415 PT Time Calculation (min) (ACUTE ONLY): 25 min  Charges:  $Gait Training: 8-22 mins $Therapeutic Exercise: 8-22 mins                    G Codes:  Functional Assessment Tool Used: AM-PAC 6 Clicks Basic Mobility Functional Limitation: Mobility: Walking and moving around Mobility: Walking and Moving Around Current Status (H6861): At least 60 percent but less than 80 percent impaired, limited or restricted Mobility: Walking and Moving Around Goal Status [phone]): At least 40 percent but less than 60 percent impaired, limited or restricted    Greggory Stallion, PT, DPT [phone]    Tasha Reyes 06/20/2017, 2:39 PM

## 2017-06-20 NOTE — Progress Notes (Signed)
Patient is medically stable for D/C to [facility] today. Per [facility] - Inpatient admissions coordinator at [facility] patient can come today to room E-6. RN will call report and arrange EMS for transport. Clinical Education officer, museum (CSW) sent D/C orders to [employer] via [employer]. Patient is aware of above. CSW contacted patient's son Annie Main and made him aware of above. Please reconsult if future social work needs arise. CSW signing off.   [employer], LCSW [phone]

## 2017-06-20 NOTE — Consult Note (Signed)
   [facility] CM Inpatient Consult   06/20/2017  JAELEE LAUGHTER 09/20/1930 [id number]   Patient screened for potential [place] Management services. Patient is on the [facility] registry as a benefit of their Next Gen medicare . Electronic medical record reveals patient's discharge plan is SNF. [facility] Care Management services not appropriate at this time. If patient's post hospital needs change please place a [facility] Care Management consult. For questions please contact:   Shakeyla Giebler RN, [facility] Liaison  [phone]) Business Mobile [phone]) Toll free office

## 2017-06-20 NOTE — Progress Notes (Signed)
Physical Therapy Treatment Patient Details Name: Tasha Reyes MRN: [id number] DOB: 23-Jan-1931 Today's Date: 06/20/2017    History of Present Illness Pt admitted for R hip fracture from fall and is now s/p R hip hemiarthroplasty.     PT Comments    Pt is making gradual progress towards goals with overall increased ease of mobility noted. Pt able to tolerate there-ex with less resistance this date. Still struggles to ambulate towards recliner, unable to further ambulate in room. Unable to recall precautions or WBing status. Will continue to progress.   Follow Up Recommendations  SNF     Equipment Recommendations  Rolling walker with 5" wheels    Recommendations for Other Services       Precautions / Restrictions Precautions Precautions: Fall;Posterior Hip Precaution Booklet Issued: Yes (comment) Restrictions Weight Bearing Restrictions: Yes RLE Weight Bearing: Partial weight bearing    Mobility  Bed Mobility Overal bed mobility: Needs Assistance Bed Mobility: Supine to Sit;Sit to Supine     Supine to sit: Mod assist     General bed mobility comments: still requires assist for sliding B LEs off bed. Slightly easier with less resisting noted and pt able to follow commands for sequencing. Once seated at EOB, able to sit with upright posture.  Transfers Overall transfer level: Needs assistance Equipment used: Rolling walker (2 wheeled) Transfers: Sit to/from Stand Sit to Stand: Min assist         General transfer comment: needs cues for sequencing. Easier transition noted this date. Upright posture noted  Ambulation/Gait Ambulation/Gait assistance: Mod assist Ambulation Distance (Feet): 2 Feet Assistive device: Rolling walker (2 wheeled) Gait Pattern/deviations: Step-to pattern     General Gait Details: heavy cues for sequencing and maintaining hip precautions. Flexes over RW despite cues. Fatigues quickly with ambulation to chair.   Stairs             Wheelchair Mobility    Modified Rankin (Stroke Patients Only)       Balance                                            Cognition Arousal/Alertness: Awake/alert Behavior During Therapy: WFL for tasks assessed/performed Overall Cognitive Status: Within Functional Limits for tasks assessed                                        Exercises Other Exercises Other Exercises: supine ther-ex performed including R LE ankle pumps, quad sets, hip ab/add, SAQ, and small SLRs. All ther-ex performed x 15 reps with min/mod assist    General Comments        Pertinent Vitals/Pain Pain Assessment: Faces Faces Pain Scale: Hurts a little bit Pain Location: R hip Pain Descriptors / Indicators: Discomfort;Grimacing;Guarding Pain Intervention(s): Limited activity within patient's tolerance    Home Living                      Prior Function            PT Goals (current goals can now be found in the care plan section) Acute Rehab PT Goals Patient Stated Goal: wants to go home PT Goal Formulation: With patient Time For Goal Achievement: 07/02/17 Potential to Achieve Goals: Good Progress towards PT goals: Progressing toward goals  Frequency    BID      PT Plan Current plan remains appropriate    Co-evaluation              AM-PAC PT "6 Clicks" Daily Activity  Outcome Measure  Difficulty turning over in bed (including adjusting bedclothes, sheets and blankets)?: Unable Difficulty moving from lying on back to sitting on the side of the bed? : Unable Difficulty sitting down on and standing up from a chair with arms (e.g., wheelchair, bedside commode, etc,.)?: Unable Help needed moving to and from a bed to chair (including a wheelchair)?: A Lot Help needed walking in hospital room?: A Lot Help needed climbing 3-5 steps with a railing? : A Lot 6 Click Score: 9    End of Session Equipment Utilized During Treatment: Gait  belt;Oxygen Activity Tolerance: Patient limited by pain Patient left: in chair;with chair alarm set Nurse Communication: Mobility status PT Visit Diagnosis: Muscle weakness (generalized) (M62.81);Difficulty in walking, not elsewhere classified (R26.2);Pain Pain - Right/Left: Right Pain - part of body: Hip     Time: 9574-7340 PT Time Calculation (min) (ACUTE ONLY): 23 min  Charges:  $Gait Training: 8-22 mins $Therapeutic Exercise: 8-22 mins                    G Codes:  Functional Assessment Tool Used: AM-PAC 6 Clicks Basic Mobility Functional Limitation: Mobility: Walking and moving around Mobility: Walking and Moving Around Current Status (Z7096): At least 60 percent but less than 80 percent impaired, limited or restricted Mobility: Walking and Moving Around Goal Status [phone]): At least 40 percent but less than 60 percent impaired, limited or restricted    Greggory Stallion, PT, DPT [phone]    Nirvi Boehler 06/20/2017, 11:28 AM

## 2017-06-20 NOTE — Discharge Summary (Addendum)
[place] at [facility], 83 y.o., DOB [birth year], MRN [id number]. Admission date: 06/16/2017 Discharge Date 06/20/2017 Primary MD Glean Hess, MD Admitting Physician Harrie Foreman, MD  Admission Diagnosis  Fall, initial encounter [id number]] Closed fracture of right hip, initial encounter [facility]) [S72.001A]  Discharge Diagnosis   Active Problems:   Hip fracture ([place]) status post repair of the right femoral neck fracture Severe reflux with nausea and a continue current medication Klebsiella UTI will finish course with oral Ceftin History of CVA Essential hypertension Hyperlipidemia unspecified COPD without acute exacerbation Previous history of DVT Hyperlipidemia History of migraines       Hospital Course The patient with past medical history of hypertension, stroke, COPD and aneurysm of the iliac artery presents to the emergency department after suffering a fall.  Patient in the emergency room was evaluated with x-ray of the right hip showed fracture of the femoral neck.  Orthopedic surgery was consulted she underwent repair of the hip.  Patient's post surgery continue complain of reflux-like symptoms which were treated.  She is still complaining of the symptoms I have adjusted her medications.  If symptoms persist she will need outpatient GI follow-up.  She was also noticed to have urinary tract infection which will be treated with oral antibiotics.  Patient is very weak and deconditioned and needs to go to rehab.     Ceftin for 4 more days Lovenox for 2 weeks with history of DVT in the past       Consults  orthopedic surgery  Significant Tests:  See full reports for all details     Dg Chest Portable 1 View  Result Date: 06/16/2017 CLINICAL DATA:  83 year old female with right hip fracture. Preop evaluation. EXAM: PORTABLE CHEST 1 VIEW COMPARISON:  Chest radiograph dated 10/04/2016 FINDINGS: There is emphysematous  changes of the lungs. No focal consolidation, pleural effusion, or pneumothorax. The cardiac silhouette is within normal limits. There is atherosclerotic calcification of the thoracic aorta. No acute osseous pathology. IMPRESSION: No acute cardiopulmonary process. Emphysema. Electronically Signed   By: Anner Crete M.D.   On: 06/16/2017 05:59   Dg Hip Port Unilat With Pelvis 1v Right  Result Date: 06/17/2017 CLINICAL DATA:  Postoperative evaluation RIGHT hip. EXAM: DG HIP (WITH OR WITHOUT PELVIS) 1V PORT RIGHT COMPARISON:  RIGHT hip radiograph June 16, 2017 FINDINGS: Interval RIGHT hip hemiarthroplasty with intact well-seated non cemented hardware. No advanced degenerative change for age. Osteopenia. Surgical clip LEFT inguinal soft tissues seen with prior vascular access. Moderate vascular calcifications. RIGHT hip subcutaneous gas with overlying skin staples. IMPRESSION: Status post RIGHT hip hemiarthroplasty with expected postoperative change. Electronically Signed   By: Elon Alas M.D.   On: 06/17/2017 16:55   Dg Hip Unilat W Or Wo Pelvis 2-3 Views Right  Result Date: 06/16/2017 CLINICAL DATA:  83 year old female with fall and right hip pain. EXAM: DG HIP (WITH OR WITHOUT PELVIS) 2-3V RIGHT COMPARISON:  None. FINDINGS: There is a transverse fracture of the right femoral neck with foreshortening of the femoral neck. No dislocation. The bones are mildly osteopenic. There is atherosclerotic calcification of the vessels. The soft tissues are grossly unremarkable. An endovascular stent graft is noted in the distal aorta. IMPRESSION: Transverse fracture of the right femoral neck with apparent impaction and foreshortening of the femoral neck. No dislocation. Electronically Signed   By: Anner Crete M.D.   On: 06/16/2017 05:27       Today  Subjective:   April Manson patient complains of nausea and difficulty with swallowing Objective:   Blood pressure 139/66, pulse 74,  temperature 97.6 F (36.4 C), temperature source Oral, resp. rate 20, height 5\' 6"  (1.676 m), weight 151 lb (68.5 kg), SpO2 96 %.  .  Intake/Output Summary (Last 24 hours) at 06/20/2017 1425 Last data filed at 06/20/2017 1009 Gross per 24 hour  Intake 220 ml  Output 575 ml  Net -355 ml    Exam VITAL SIGNS: Blood pressure 139/66, pulse 74, temperature 97.6 F (36.4 C), temperature source Oral, resp. rate 20, height 5\' 6"  (1.676 m), weight 151 lb (68.5 kg), SpO2 96 %.  GENERAL:  83 y.o.-year-old patient lying in the bed with no acute distress.  EYES: Pupils equal, round, reactive to light and accommodation. No scleral icterus. Extraocular muscles intact.  HEENT: Head atraumatic, normocephalic. Oropharynx and nasopharynx clear.  NECK:  Supple, no jugular venous distention. No thyroid enlargement, no tenderness.  LUNGS: Normal breath sounds bilaterally, no wheezing, rales,rhonchi or crepitation. No use of accessory muscles of respiration.  CARDIOVASCULAR: S1, S2 normal. No murmurs, rubs, or gallops.  ABDOMEN: Soft, nontender, nondistended. Bowel sounds present. No organomegaly or mass.  EXTREMITIES: No pedal edema, cyanosis, or clubbing.  NEUROLOGIC: Cranial nerves II through XII are intact. Muscle strength 5/5 in all extremities. Sensation intact. Gait not checked.  PSYCHIATRIC: The patient is alert and oriented x 3.  SKIN: No obvious rash, lesion, or ulcer.   Data Review     CBC w Diff:  Lab Results  Component Value Date   WBC 10.0 06/20/2017   HGB 10.3 (L) 06/20/2017   HGB 15.4 03/19/2017   HCT 30.2 (L) 06/20/2017   HCT 46.5 03/19/2017   PLT 310 06/20/2017   PLT 411 (H) 03/19/2017   LYMPHOPCT 11 06/16/2017   LYMPHOPCT 15.1 02/28/2013   MONOPCT 5 06/16/2017   MONOPCT 9.9 02/28/2013   EOSPCT 1 06/16/2017   EOSPCT 1.3 02/28/2013   BASOPCT 1 06/16/2017   BASOPCT 0.6 02/28/2013   CMP:  Lab Results  Component Value Date   NA 132 (L) 06/19/2017   NA 138 04/30/2017   NA  137 02/27/2013   K 4.2 06/19/2017   K 3.6 02/27/2013   CL 96 (L) 06/19/2017   CL 105 02/27/2013   CO2 29 06/19/2017   CO2 26 02/27/2013   BUN 12 06/19/2017   BUN 9 04/30/2017   BUN 4 (L) 02/27/2013   CREATININE 0.76 06/19/2017   CREATININE 0.72 02/27/2013   PROT 7.7 03/19/2017   PROT 5.6 (L) 02/21/2013   ALBUMIN 4.5 03/19/2017   ALBUMIN 2.7 (L) 02/21/2013   BILITOT 0.4 03/19/2017   BILITOT 0.3 02/21/2013   ALKPHOS 93 03/19/2017   ALKPHOS 85 02/21/2013   AST 18 03/19/2017   AST 17 02/21/2013   ALT 7 03/19/2017   ALT 15 02/21/2013  .  Micro Results Recent Results (from the past 240 hour(s))  Surgical PCR screen     Status: None   Collection Time: 06/16/17  6:20 PM  Result Value Ref Range Status   MRSA, PCR NEGATIVE NEGATIVE Final   Staphylococcus aureus NEGATIVE NEGATIVE Final    Comment: (NOTE) The Xpert SA Assay (FDA approved for NASAL specimens in patients 48 years of age and older), is one component of a comprehensive surveillance program. It is not intended to diagnose infection nor to guide or monitor treatment.   Urine Culture     Status: Abnormal   Collection  Time: 06/17/17  9:12 AM  Result Value Ref Range Status   Specimen Description URINE, RANDOM  Final   Special Requests NONE  Final   Culture >=100,000 COLONIES/mL KLEBSIELLA PNEUMONIAE (A)  Final   Report Status 06/20/2017 FINAL  Final   Organism ID, Bacteria KLEBSIELLA PNEUMONIAE (A)  Final      Susceptibility   Klebsiella pneumoniae - MIC*    AMPICILLIN >=32 RESISTANT Resistant     CEFAZOLIN <=4 SENSITIVE Sensitive     CEFTRIAXONE <=1 SENSITIVE Sensitive     CIPROFLOXACIN <=0.25 SENSITIVE Sensitive     GENTAMICIN <=1 SENSITIVE Sensitive     IMIPENEM <=0.25 SENSITIVE Sensitive     NITROFURANTOIN 64 INTERMEDIATE Intermediate     TRIMETH/SULFA <=20 SENSITIVE Sensitive     AMPICILLIN/SULBACTAM 8 SENSITIVE Sensitive     PIP/TAZO <=4 SENSITIVE Sensitive     Extended ESBL NEGATIVE Sensitive     *  >=100,000 COLONIES/mL KLEBSIELLA PNEUMONIAE        Code Status Orders  (From admission, onward)        Start     Ordered   06/16/17 0814  Full code  Continuous     06/16/17 0813    Code Status History    Date Active Date Inactive Code Status Order ID Comments User Context   08/19/2016 20:14 08/21/2016 18:34 Full Code [id number]  Nicholes Mango, MD Inpatient    Advance Directive Documentation     Most Recent Value  Type of Advance Directive  Living will  Pre-existing out of facility DNR order (yellow form or pink MOST form)  No data  "MOST" Form in Place?  No data           Contact information for follow-up providers    Earnestine Leys, MD. Schedule an appointment as soon as possible for a visit on 07/04/2017.   Specialty:  Specialist Why:  at [place] information: [place] [address] [phone]        Glean Hess, MD Follow up in 1 week(s).   Specialty:  Internal Medicine Contact information: [place] [address] [phone]            Contact information for after-discharge care    Destination    [facility] Follow up.   Service:  Skilled Nursing Contact information: [address] [place] [phone]                  Discharge Medications   Allergies as of 06/20/2017   No Known Allergies     Medication List    STOP taking these medications   HYDROcodone-acetaminophen 5-325 MG tablet Commonly known as:  NORCO/VICODIN   predniSONE 5 MG tablet Commonly known as:  DELTASONE     TAKE these medications   acetaminophen 325 MG tablet Commonly known as:  TYLENOL Take 2 tablets (650 mg total) by mouth every 6 (six) hours as needed for mild pain (or Fever >/= 101).   albuterol (2.5 MG/3ML) 0.083% nebulizer solution Commonly known as:  PROVENTIL Take 3 mLs (2.5 mg total) by nebulization every 4 (four) hours as needed for wheezing  or shortness of breath.   alum & mag hydroxide-simeth 200-200-20 MG/5ML suspension Commonly known as:  MAALOX/MYLANTA Take 30 mLs by mouth every 4 (four) hours as needed for indigestion.   atenolol 25 MG tablet Commonly known as:  TENORMIN Take 1 tablet (25 mg total) by mouth 2 (  two) times daily.   atorvastatin 10 MG tablet Commonly known as:  LIPITOR TAKE 1 TABLET BY MOUTH EVERY NIGHT AT BEDTIME   cefUROXime 500 MG tablet Commonly known as:  CEFTIN Take 1 tablet (500 mg total) by mouth 2 (two) times daily for 4 days.   clopidogrel 75 MG tablet Commonly known as:  PLAVIX TAKE 1 TABLET BY MOUTH DAILY   docusate sodium 100 MG capsule Commonly known as:  COLACE Take 1 capsule (100 mg total) by mouth 2 (two) times daily.   enoxaparin 40 MG/0.4ML injection Commonly known as:  LOVENOX Inject 0.4 mLs (40 mg total) into the skin daily for 14 days.   ferrous sulfate 325 (65 FE) MG tablet TAKE 1 TABLET(325 MG) BY MOUTH DAILY   MAGNESIUM-OXIDE 400 (241.3 Mg) MG tablet Generic drug:  magnesium oxide Take 1 tablet (400 mg total) by mouth daily. Reported on 11/07/2015   methocarbamol 750 MG tablet Commonly known as:  ROBAXIN Take 1 tablet (750 mg total) by mouth 3 (three) times daily as needed for muscle spasms.   mirtazapine 15 MG tablet Commonly known as:  REMERON Take 15 mg by mouth at bedtime.   ondansetron 4 MG tablet Commonly known as:  ZOFRAN Take 1 tablet (4 mg total) by mouth every 6 (six) hours as needed for nausea.   oxyCODONE-acetaminophen 5-325 MG tablet Commonly known as:  PERCOCET/ROXICET Take 1 tablet by mouth every 4 (four) hours as needed for moderate pain.   pantoprazole 40 MG tablet Commonly known as:  PROTONIX TAKE 1 TABLET(40 MG) BY MOUTH TWICE DAILY   polyethylene glycol powder powder Commonly known as:  GLYCOLAX/MIRALAX MIX AND TAKE 17 GRAMS BY MOUTH prn   sucralfate 1 g tablet Commonly known as:  CARAFATE Take 1 tablet (1 g total) by mouth 4  (four) times daily -  with meals and at bedtime.   Vitamin D3 2000 units capsule Take 1 capsule (2,000 Units total) by mouth daily.   zolpidem 5 MG tablet Commonly known as:  AMBIEN Take 1 tablet (5 mg total) by mouth at bedtime as needed for sleep.          Total Time in preparing paper work, data evaluation and todays exam - 35 minutes  Dustin Flock M.D on 06/20/2017 at 2:25 [facility]  [facility]   Office  [phone]

## 2017-06-21 DIAGNOSIS — R0600 Dyspnea, unspecified: Secondary | ICD-10-CM | POA: Diagnosis not present

## 2017-06-21 DIAGNOSIS — R2681 Unsteadiness on feet: Secondary | ICD-10-CM | POA: Diagnosis not present

## 2017-06-21 DIAGNOSIS — Z9911 Dependence on respirator [ventilator] status: Secondary | ICD-10-CM | POA: Diagnosis not present

## 2017-06-21 DIAGNOSIS — R531 Weakness: Secondary | ICD-10-CM | POA: Diagnosis not present

## 2017-06-21 DIAGNOSIS — M84459D Pathological fracture, hip, unspecified, subsequent encounter for fracture with routine healing: Secondary | ICD-10-CM | POA: Diagnosis not present

## 2017-06-21 DIAGNOSIS — M6281 Muscle weakness (generalized): Secondary | ICD-10-CM | POA: Diagnosis not present

## 2017-06-21 DIAGNOSIS — I714 Abdominal aortic aneurysm, without rupture: Secondary | ICD-10-CM | POA: Diagnosis not present

## 2017-06-21 DIAGNOSIS — J449 Chronic obstructive pulmonary disease, unspecified: Secondary | ICD-10-CM | POA: Diagnosis not present

## 2017-06-21 NOTE — Progress Notes (Signed)
Patient is being discharged to [facility] via EMS. IV removed with cath intact. VSS. Report called. Papaerwork and script sent with patient. BSC and flowers to be picked up by son. On 1L O2.

## 2017-06-21 NOTE — Clinical Social Work Note (Signed)
CSW contacted the unit secretary for discharge update. The patient was set to discharge to [facility], yesterday (06/20/17); however, EMS was not able to transport due to unforseen issues. The unit secretary confirmed that EMS is here now to transport the patient. CSW will continue to follow pending any need for updated discharge paperwork.  Santiago Bumpers, MSW, Latanya Presser [phone]

## 2017-06-29 ENCOUNTER — Other Ambulatory Visit: Payer: Self-pay | Admitting: Internal Medicine

## 2017-06-29 DIAGNOSIS — I10 Essential (primary) hypertension: Secondary | ICD-10-CM

## 2017-06-30 NOTE — Telephone Encounter (Signed)
Called pt and left a message.

## 2017-07-09 DIAGNOSIS — M25551 Pain in right hip: Secondary | ICD-10-CM | POA: Diagnosis not present

## 2017-07-23 ENCOUNTER — Other Ambulatory Visit: Payer: Self-pay

## 2017-07-23 ENCOUNTER — Ambulatory Visit: Payer: Medicare Other | Attending: Nurse Practitioner | Admitting: Nurse Practitioner

## 2017-07-23 ENCOUNTER — Encounter: Payer: Self-pay | Admitting: Nurse Practitioner

## 2017-07-23 VITALS — BP 116/55 | HR 90 | Temp 98.4°F | Resp 18 | Ht 66.0 in | Wt 109.0 lb

## 2017-07-23 DIAGNOSIS — J449 Chronic obstructive pulmonary disease, unspecified: Secondary | ICD-10-CM | POA: Diagnosis not present

## 2017-07-23 DIAGNOSIS — M5417 Radiculopathy, lumbosacral region: Secondary | ICD-10-CM | POA: Insufficient documentation

## 2017-07-23 DIAGNOSIS — I739 Peripheral vascular disease, unspecified: Secondary | ICD-10-CM | POA: Diagnosis not present

## 2017-07-23 DIAGNOSIS — I10 Essential (primary) hypertension: Secondary | ICD-10-CM | POA: Insufficient documentation

## 2017-07-23 DIAGNOSIS — Z79899 Other long term (current) drug therapy: Secondary | ICD-10-CM | POA: Insufficient documentation

## 2017-07-23 DIAGNOSIS — M79604 Pain in right leg: Secondary | ICD-10-CM | POA: Diagnosis not present

## 2017-07-23 DIAGNOSIS — F329 Major depressive disorder, single episode, unspecified: Secondary | ICD-10-CM | POA: Insufficient documentation

## 2017-07-23 DIAGNOSIS — M7918 Myalgia, other site: Secondary | ICD-10-CM | POA: Insufficient documentation

## 2017-07-23 DIAGNOSIS — M546 Pain in thoracic spine: Secondary | ICD-10-CM | POA: Diagnosis not present

## 2017-07-23 DIAGNOSIS — G2581 Restless legs syndrome: Secondary | ICD-10-CM | POA: Diagnosis not present

## 2017-07-23 DIAGNOSIS — M25551 Pain in right hip: Secondary | ICD-10-CM | POA: Diagnosis not present

## 2017-07-23 DIAGNOSIS — Z79891 Long term (current) use of opiate analgesic: Secondary | ICD-10-CM | POA: Insufficient documentation

## 2017-07-23 DIAGNOSIS — Z86718 Personal history of other venous thrombosis and embolism: Secondary | ICD-10-CM | POA: Insufficient documentation

## 2017-07-23 DIAGNOSIS — M5416 Radiculopathy, lumbar region: Secondary | ICD-10-CM

## 2017-07-23 DIAGNOSIS — Z96641 Presence of right artificial hip joint: Secondary | ICD-10-CM | POA: Insufficient documentation

## 2017-07-23 DIAGNOSIS — Z5181 Encounter for therapeutic drug level monitoring: Secondary | ICD-10-CM | POA: Insufficient documentation

## 2017-07-23 DIAGNOSIS — E559 Vitamin D deficiency, unspecified: Secondary | ICD-10-CM | POA: Diagnosis not present

## 2017-07-23 DIAGNOSIS — E43 Unspecified severe protein-calorie malnutrition: Secondary | ICD-10-CM | POA: Diagnosis not present

## 2017-07-23 DIAGNOSIS — M48061 Spinal stenosis, lumbar region without neurogenic claudication: Secondary | ICD-10-CM | POA: Insufficient documentation

## 2017-07-23 DIAGNOSIS — Z9981 Dependence on supplemental oxygen: Secondary | ICD-10-CM | POA: Diagnosis not present

## 2017-07-23 DIAGNOSIS — M545 Low back pain: Secondary | ICD-10-CM | POA: Insufficient documentation

## 2017-07-23 DIAGNOSIS — K219 Gastro-esophageal reflux disease without esophagitis: Secondary | ICD-10-CM | POA: Insufficient documentation

## 2017-07-23 DIAGNOSIS — Z7902 Long term (current) use of antithrombotics/antiplatelets: Secondary | ICD-10-CM | POA: Insufficient documentation

## 2017-07-23 DIAGNOSIS — G894 Chronic pain syndrome: Secondary | ICD-10-CM | POA: Insufficient documentation

## 2017-07-23 DIAGNOSIS — E7800 Pure hypercholesterolemia, unspecified: Secondary | ICD-10-CM | POA: Diagnosis not present

## 2017-07-23 DIAGNOSIS — Z8673 Personal history of transient ischemic attack (TIA), and cerebral infarction without residual deficits: Secondary | ICD-10-CM | POA: Insufficient documentation

## 2017-07-23 DIAGNOSIS — I714 Abdominal aortic aneurysm, without rupture: Secondary | ICD-10-CM | POA: Diagnosis not present

## 2017-07-23 DIAGNOSIS — F17210 Nicotine dependence, cigarettes, uncomplicated: Secondary | ICD-10-CM | POA: Diagnosis not present

## 2017-07-23 DIAGNOSIS — M5441 Lumbago with sciatica, right side: Secondary | ICD-10-CM | POA: Diagnosis not present

## 2017-07-23 DIAGNOSIS — Z9660 Presence of unspecified orthopedic joint implant: Secondary | ICD-10-CM | POA: Insufficient documentation

## 2017-07-23 DIAGNOSIS — Z780 Asymptomatic menopausal state: Secondary | ICD-10-CM | POA: Insufficient documentation

## 2017-07-23 DIAGNOSIS — G8929 Other chronic pain: Secondary | ICD-10-CM

## 2017-07-23 MED ORDER — VITAMIN D3 50 MCG (2000 UT) PO CAPS: ORAL_CAPSULE | ORAL | 12 refills | Status: DC

## 2017-07-23 MED ORDER — HYDROCODONE-ACETAMINOPHEN 5-325 MG PO TABS: 1.0000 | ORAL_TABLET | Freq: Three times a day (TID) | ORAL | 0 refills | Status: DC | PRN

## 2017-07-23 MED ORDER — METHOCARBAMOL 750 MG PO TABS: 750.0000 mg | ORAL_TABLET | Freq: Three times a day (TID) | ORAL | 2 refills | Status: DC | PRN

## 2017-07-23 MED ORDER — MAGNESIUM-OXIDE 400 (241.3 MG) MG PO TABS: 1.0000 | ORAL_TABLET | Freq: Every day | ORAL | 3 refills | Status: DC

## 2017-07-23 NOTE — Patient Instructions (Addendum)

## 2017-07-23 NOTE — Progress Notes (Signed)
Nursing Pain Medication Assessment:  Safety precautions to be maintained throughout the outpatient stay will include: orient to surroundings, keep bed in low position, maintain call bell within reach at all times, provide assistance with transfer out of bed and ambulation.  Medication Inspection Compliance: Pill count conducted under aseptic conditions, in front of the patient. Neither the pills nor the bottle was removed from the patient's sight at any time. Once count was completed pills were immediately returned to the patient in their original bottle.  Medication: Hydrocodone/APAP Pill/Patch Count: 11 of 90 pills remain Pill/Patch Appearance: Markings consistent with prescribed medication Bottle Appearance: Standard pharmacy container. Clearly labeled. Filled Date: 63 / 13 / 2018 Last Medication intake:  Today

## 2017-07-23 NOTE — Progress Notes (Signed)
Patient's Name: Tasha Reyes  MRN: [id number]  Referring Provider: Glean Hess, MD  DOB: 25-Oct-1930  PCP: Glean Hess, MD  DOS: 07/23/2017  Note by: Vevelyn Francois NP  Service setting: Ambulatory outpatient  Specialty: Interventional Pain Management  Location: [facility] (AMB) Pain Management Facility    Patient type: Established    Primary Reason(s) for Visit: Encounter for prescription drug management. (Level of risk: moderate)  CC: Back Pain (low and mid); Hip Pain (right); and Leg Pain (right)  HPI  Tasha Reyes is a 84 y.o. year old, female patient, who comes today for a medication management evaluation. She has Dyslipidemia; Compulsive tobacco user syndrome; Chronic peptic ulcer with bleeding; Essential (primary) hypertension; Depression, major, recurrent, in partial remission ([place]); COPD (chronic obstructive pulmonary disease) ([place]); Vascular disorder of lower extremity; Restless leg; Chronic tension-type headache, intractable; Chronic low back pain (Location of Secondary source of pain) (Bilateral) (R>L); Chronic lower extremity pain (Location of Primary Source of Pain) (Right); Lumbosacral Radiculopathy (Right); Chronic radicular lumbar pain (Right); Long term current use of opiate analgesic; Long term prescription opiate use; Opiate use; Peripheral vascular disease ([place]); Lumbar spondylosis; Hx of long term use of blood thinners (Plavix); Lower extremity pain (Right); Chronic pain syndrome; Neurogenic pain; History of DVT (deep vein thrombosis); Lumbar facet hypertrophy; Facet syndrome, lumbar ([place]); Lumbar foraminal stenosis (Right) (Severe at L4-5); Opioid-induced constipation ([facility]); Osteoporosis, post-menopausal; GERD (gastroesophageal reflux disease); Vitamin D deficiency; Nausea and vomiting; Protein-calorie malnutrition, severe; Dependence on supplemental oxygen; Edema due to malnutrition [facility]); AAA (abdominal aortic aneurysm) without rupture ([place]); and Hip fracture (HCC) on  their problem list. Her primarily concern today is the Back Pain (low and mid); Hip Pain (right); and Leg Pain (right)  Pain Assessment: Location: Lower, Mid Back Radiating: right hip and leg Onset: More than a month ago Duration: Chronic pain Quality: Constant, Aching, Throbbing, Shooting Severity: 3 /10 (self-reported pain score)  Note: Reported level is compatible with observation.                         When using our objective Pain Scale, levels between 6 and 10/10 are said to belong in an emergency room, as it progressively worsens from a 6/10, described as severely limiting, requiring emergency care not usually available at an outpatient pain management facility. At a 6/10 level, communication becomes difficult and requires great effort. Assistance to reach the emergency department may be required. Facial flushing and profuse sweating along with potentially dangerous increases in heart rate and blood pressure will be evident. Effect on ADL:   Timing: Constant Modifying factors: medications, rest  [name] was last scheduled for an appointment on 04/22/2017 for medication management. During today's appointment we reviewed Tasha Reyes's chronic pain status, as well as her outpatient medication regimen. She suffered a fall off her bed and broke her hip on 06/19/17. She admits that is after being dizzy at 2 am in the morning.  She is using the walker.   The patient  reports that she does not use drugs. Her body mass index is 17.59 kg/m.  Further details on both, my assessment(s), as well as the proposed treatment plan, please see below.  Controlled Substance Pharmacotherapy Assessment REMS (Risk Evaluation and Mitigation Strategy)  Analgesic:Hydrocodone/APAP 5/325 one tablet every 8 hours (15 mg/day) MME/day:15 mg/day.    Hart Rochester, RN  07/23/2017  2:04 PM  Sign at close encounter Nursing Pain Medication Assessment:  Safety precautions to  be maintained throughout the  outpatient stay will include: orient to surroundings, keep bed in low position, maintain call bell within reach at all times, provide assistance with transfer out of bed and ambulation.  Medication Inspection Compliance: Pill count conducted under aseptic conditions, in front of the patient. Neither the pills nor the bottle was removed from the patient's sight at any time. Once count was completed pills were immediately returned to the patient in their original bottle.  Medication: Hydrocodone/APAP Pill/Patch Count: 11 of 90 pills remain Pill/Patch Appearance: Markings consistent with prescribed medication Bottle Appearance: Standard pharmacy container. Clearly labeled. Filled Date: 59 / 13 / 2018 Last Medication intake:  Today   Pharmacokinetics: Liberation and absorption (onset of action): WNL Distribution (time to peak effect): WNL Metabolism and excretion (duration of action): WNL         Pharmacodynamics: Desired effects: Analgesia: [name] reports >50% benefit. Functional ability: Patient reports that medication allows her to accomplish basic ADLs Clinically meaningful improvement in function (CMIF): Sustained CMIF goals met Perceived effectiveness: Described as relatively effective, allowing for increase in activities of daily living (ADL) Undesirable effects: Side-effects or Adverse reactions: None reported Monitoring: [place] PMP: Online review of the past 26-monthperiod conducted. Compliant with practice rules and regulations Last UDS on record: Summary  Date Value Ref Range Status  01/22/2017 FINAL  Final    Comment:    ==================================================================== TOXASSURE SELECT 13 (MW) ==================================================================== Test                             Result       Flag       Units Drug Present and Declared for Prescription Verification   Hydrocodone                    1667         EXPECTED   ng/mg creat    Hydromorphone                  315          EXPECTED   ng/mg creat   Dihydrocodeine                 219          EXPECTED   ng/mg creat   Norhydrocodone                 >2347        EXPECTED   ng/mg creat    Sources of hydrocodone include scheduled prescription    medications. Hydromorphone, dihydrocodeine and norhydrocodone are    expected metabolites of hydrocodone. Hydromorphone and    dihydrocodeine are also available as scheduled prescription    medications. ==================================================================== Test                      Result    Flag   Units      Ref Range   Creatinine              213              mg/dL      >=20 ==================================================================== Declared Medications:  The flagging and interpretation on this report are based on the  following declared medications.  Unexpected results may arise from  inaccuracies in the declared medications.  **Note: The testing scope of this panel includes these medications:  Hydrocodone (Hydrocodone-Acetaminophen)  **Note: The testing  scope of this panel does not include following  reported medications:  Acetaminophen (Hydrocodone-Acetaminophen)  Albuterol  Atenolol  Atorvastatin  Benzonatate  Cholecalciferol  Clopidogrel  Iron (Ferrous Sulfate)  Magnesium Oxide  Methocarbamol  Mirtazapine  Pantoprazole (Protonix)  Polyethylene Glycol  Supplement ==================================================================== For clinical consultation, please call [phone]. ====================================================================    UDS interpretation: Compliant          Medication Assessment Form: Reviewed. Patient indicates being compliant with therapy Treatment compliance: Compliant Risk Assessment Profile: Aberrant behavior: See prior evaluations. None observed or detected today Comorbid factors increasing risk of overdose: See prior notes. No additional  risks detected today Risk of substance use disorder (SUD): Low Opioid Risk Tool - 07/23/17 1400      Family History of Substance Abuse   Alcohol  Positive Female    Illegal Drugs  Positive Female    Rx Drugs  Positive Female or Female      Personal History of Substance Abuse   Alcohol  Negative    Illegal Drugs  Negative    Rx Drugs  Negative      Age   Age between 9-45 years   No      History of Preadolescent Sexual Abuse   History of Preadolescent Sexual Abuse  Negative or Female      Psychological Disease   Psychological Disease  Negative    Depression  Negative      Total Score   Opioid Risk Tool Scoring  7    Opioid Risk Interpretation  Moderate Risk      ORT Scoring interpretation table:  Score <3 = Low Risk for SUD  Score between 4-7 = Moderate Risk for SUD  Score >8 = High Risk for Opioid Abuse   Risk Mitigation Strategies:  Patient Counseling: Covered Patient-Prescriber Agreement (PPA): Present and active  Notification to other healthcare providers: Done  Pharmacologic Plan: No change in therapy, at this time.             Laboratory Chemistry  Inflammation Markers (CRP: Acute Phase) (ESR: Chronic Phase) Lab Results  Component Value Date   CRP 1.1 (H) 01/08/2016   ESRSEDRATE 3 01/08/2016                 Rheumatology Markers No results found for: RF, ANA, Therisa Doyne, [facility]              Renal Function Markers Lab Results  Component Value Date   BUN 12 06/19/2017   CREATININE 0.76 06/19/2017   GFRAA >60 06/19/2017   GFRNONAA >60 06/19/2017                 Hepatic Function Markers Lab Results  Component Value Date   AST 18 03/19/2017   ALT 7 03/19/2017   ALBUMIN 4.5 03/19/2017   ALKPHOS 93 03/19/2017                 Electrolytes Lab Results  Component Value Date   NA 132 (L) 06/19/2017   K 4.2 06/19/2017   CL 96 (L) 06/19/2017   CALCIUM 8.6 (L) 06/19/2017   MG 2.0 01/08/2016                 Neuropathy  Markers No results found for: VITAMINB12, FOLATE, HGBA1C, HIV               Bone Pathology Markers Lab Results  Component Value Date   VD25OH 5.0 (L) 01/08/2016   25OHVITD1 58 04/15/2016  25OHVITD2 32 04/15/2016   25OHVITD3 26 04/15/2016                 Coagulation Parameters Lab Results  Component Value Date   INR 1.07 06/16/2017   LABPROT 13.8 06/16/2017   APTT 110.0 (H) 11/15/2011   PLT 310 06/20/2017                 Cardiovascular Markers Lab Results  Component Value Date   BNP 416.0 (H) 08/19/2016   TROPONINI <0.03 08/19/2016   HGB 10.3 (L) 06/20/2017   HCT 30.2 (L) 06/20/2017                 CA Markers No results found for: CEA, CA125, LABCA2               Note: Lab results reviewed.  Recent Diagnostic Imaging Results  DG Hip Port Unilat With Pelvis 1V Right CLINICAL DATA:  Postoperative evaluation RIGHT hip.  EXAM: DG HIP (WITH OR WITHOUT PELVIS) 1V PORT RIGHT  COMPARISON:  RIGHT hip radiograph June 16, 2017  FINDINGS: Interval RIGHT hip hemiarthroplasty with intact well-seated non cemented hardware. No advanced degenerative change for age. Osteopenia. Surgical clip LEFT inguinal soft tissues seen with prior vascular access. Moderate vascular calcifications. RIGHT hip subcutaneous gas with overlying skin staples.  IMPRESSION: Status post RIGHT hip hemiarthroplasty with expected postoperative change.  Electronically Signed   By: Elon Alas M.D.   On: 06/17/2017 16:55  Complexity Note: Imaging results reviewed. Results shared with Ms. Hamil, using [employer].                         Meds   Current Outpatient Medications:  .  acetaminophen (TYLENOL) 325 MG tablet, Take 2 tablets (650 mg total) by mouth every 6 (six) hours as needed for mild pain (or Fever >/= 101)., Disp: , Rfl:  .  alum & mag hydroxide-simeth (MAALOX/MYLANTA) 200-200-20 MG/5ML suspension, Take 30 mLs by mouth every 4 (four) hours as needed for indigestion.,  Disp: 355 mL, Rfl: 0 .  atenolol (TENORMIN) 25 MG tablet, TAKE 1 TABLET(25 MG) BY MOUTH TWICE DAILY, Disp: 60 tablet, Rfl: 5 .  atorvastatin (LIPITOR) 10 MG tablet, TAKE 1 TABLET BY MOUTH EVERY NIGHT AT BEDTIME, Disp: 90 tablet, Rfl: 0 .  Cholecalciferol (VITAMIN D3) 2000 units capsule, Take 1 capsule (2,000 Units total) by mouth daily., Disp: 30 capsule, Rfl: 12 .  clopidogrel (PLAVIX) 75 MG tablet, TAKE 1 TABLET BY MOUTH DAILY, Disp: 30 tablet, Rfl: 5 .  ferrous sulfate 325 (65 FE) MG tablet, TAKE 1 TABLET(325 MG) BY MOUTH DAILY, Disp: 30 tablet, Rfl: 12 .  [START ON 08/16/2017] methocarbamol (ROBAXIN) 750 MG tablet, Take 1 tablet (750 mg total) by mouth 3 (three) times daily as needed for muscle spasms., Disp: 90 tablet, Rfl: 2 .  mirtazapine (REMERON) 15 MG tablet, Take 15 mg by mouth at bedtime. , Disp: , Rfl: 5 .  ondansetron (ZOFRAN) 4 MG tablet, Take 1 tablet (4 mg total) by mouth every 6 (six) hours as needed for nausea., Disp: 20 tablet, Rfl: 0 .  oxyCODONE-acetaminophen (PERCOCET/ROXICET) 5-325 MG tablet, Take 1 tablet by mouth every 4 (four) hours as needed for moderate pain., Disp: 30 tablet, Rfl: 0 .  pantoprazole (PROTONIX) 40 MG tablet, TAKE 1 TABLET(40 MG) BY MOUTH TWICE DAILY, Disp: 60 tablet, Rfl: 5 .  polyethylene glycol powder (GLYCOLAX/MIRALAX) powder, MIX AND TAKE 17 GRAMS BY MOUTH prn, Disp: ,  Rfl: 5 .  enoxaparin (LOVENOX) 40 MG/0.4ML injection, Inject 0.4 mLs (40 mg total) into the skin daily for 14 days., Disp: , Rfl:  .  [START ON 10/15/2017] HYDROcodone-acetaminophen (NORCO/VICODIN) 5-325 MG tablet, Take 1 tablet by mouth every 8 (eight) hours as needed for severe pain., Disp: 90 tablet, Rfl: 0 .  [START ON 09/15/2017] HYDROcodone-acetaminophen (NORCO/VICODIN) 5-325 MG tablet, Take 1 tablet by mouth every 8 (eight) hours as needed for severe pain., Disp: 90 tablet, Rfl: 0 .  [START ON 08/16/2017] HYDROcodone-acetaminophen (NORCO/VICODIN) 5-325 MG tablet, Take 1 tablet by mouth  every 8 (eight) hours as needed for severe pain., Disp: 90 tablet, Rfl: 0 .  [START ON 08/16/2017] MAGNESIUM-OXIDE 400 (241.3 Mg) MG tablet, Take 1 tablet (400 mg total) by mouth daily. Reported on 11/07/2015, Disp: 90 tablet, Rfl: 3  ROS  Constitutional: Denies any fever or chills Gastrointestinal: No reported hemesis, hematochezia, vomiting, or acute GI distress Musculoskeletal: Denies any acute onset joint swelling, redness, loss of ROM, or weakness Neurological: No reported episodes of acute onset apraxia, aphasia, dysarthria, agnosia, amnesia, paralysis, loss of coordination, or loss of consciousness  Allergies  Ms. Minshall has No Known Allergies.  PFSH  Drug: Ms. Para  reports that she does not use drugs. Alcohol:  reports that she does not drink alcohol. Tobacco:  reports that she has been smoking cigarettes.  She has a 15.25 pack-year smoking history. she has never used smokeless tobacco. Medical:  has a past medical history of Aneurysm of iliac artery ([place]) (05/11/2015), COPD (chronic obstructive pulmonary disease) ([place]), Cystitis, History of DVT (deep vein thrombosis) (05/11/2015), History of peptic ulcer disease (05/11/2015), [name], Hypertension, Losing weight, Migraines, [name], Stroke ([place]), and Vascular disease. Surgical: Ms. Cangemi  has a past surgical history that includes Partial hysterectomy; [name]; [name], 2008); Back surgery; Colonoscopy (2012); Esophagogastroduodenoscopy (04/2013); Abdominal hysterectomy; Appendectomy; blood clot ; Hip Arthroplasty (Right, 06/17/2017); and Joint replacement (Right, 06/19/2017). Family: family history includes Brain cancer in her father; Kidney disease in her mother.  Constitutional Exam  General appearance: Well nourished, well developed, and well hydrated. In no apparent acute distress Vitals:   07/23/17 1353  BP:  (!) 116/55  Pulse: 90  Resp: 18  Temp: 98.4 F (36.9 C)  TempSrc: Oral  SpO2: 90%  Weight: 109 lb (49.4 kg)  Height: _0  (1.676 m)   BMI Assessment: Estimated body mass index is 17.59 kg/m as calculated from the following:   Height as of this encounter: _1  (1.676 m).   Weight as of this encounter: 109 lb (49.4 kg).  BMI interpretation table: BMI level Category Range association with higher incidence of chronic pain  <18 kg/m2 Underweight   18.5-24.9 kg/m2 Ideal body weight   25-29.9 kg/m2 Overweight Increased incidence by 20%  30-34.9 kg/m2 Obese (Class I) Increased incidence by 68%  35-39.9 kg/m2 Severe obesity (Class II) Increased incidence by 136%  >40 kg/m2 Extreme obesity (Class III) Increased incidence by 254%   BMI Readings from Last 4 Encounters:  07/23/17 17.59 kg/m  06/21/17 24.21 kg/m  04/30/17 17.46 kg/m  04/22/17 17.59 kg/m   Wt Readings from Last 4 Encounters:  07/23/17 109 lb (49.4 kg)  06/21/17 150 lb (68 kg)  04/30/17 108 lb 3.2 oz (49.1 kg)  04/22/17 109 lb (49.4 kg)  Psych/Mental status: Alert, oriented x 3 (person, place, & time)       Eyes: PERLA Respiratory: No  evidence of acute respiratory distress  Cervical Spine Area Exam  Skin & Axial Inspection: No masses, redness, edema, swelling, or associated skin lesions Alignment: Symmetrical Functional ROM: Unrestricted ROM      Stability: No instability detected Muscle Tone/Strength: Functionally intact. No obvious neuro-muscular anomalies detected. Sensory (Neurological): Unimpaired Palpation: No palpable anomalies              Upper Extremity (UE) Exam    Side: Right upper extremity  Side: Left upper extremity  Skin & Extremity Inspection: Skin color, temperature, and hair growth are WNL. No peripheral edema or cyanosis. No masses, redness, swelling, asymmetry, or associated skin lesions. No contractures.  Skin & Extremity Inspection: Skin color, temperature, and hair growth are WNL. No  peripheral edema or cyanosis. No masses, redness, swelling, asymmetry, or associated skin lesions. No contractures.  Functional ROM: Unrestricted ROM          Functional ROM: Unrestricted ROM          Muscle Tone/Strength: Functionally intact. No obvious neuro-muscular anomalies detected.  Muscle Tone/Strength: Functionally intact. No obvious neuro-muscular anomalies detected.  Sensory (Neurological): Unimpaired          Sensory (Neurological): Unimpaired          Palpation: No palpable anomalies              Palpation: No palpable anomalies              Specialized Test(s): Deferred         Specialized Test(s): Deferred          Thoracic Spine Area Exam  Skin & Axial Inspection: No masses, redness, or swelling Alignment: Symmetrical Functional ROM: Unrestricted ROM Stability: No instability detected Muscle Tone/Strength: Functionally intact. No obvious neuro-muscular anomalies detected. Sensory (Neurological): Unimpaired Muscle strength & Tone: No palpable anomalies  Lumbar Spine Area Exam  Skin & Axial Inspection: No masses, redness, or swelling Alignment: Symmetrical Functional ROM: Unrestricted ROM      Stability: No instability detected Muscle Tone/Strength: Functionally intact. No obvious neuro-muscular anomalies detected. Sensory (Neurological): Unimpaired Palpation: No palpable anomalies       Provocative Tests: Lumbar Hyperextension and rotation test: evaluation deferred today       Lumbar Lateral bending test: evaluation deferred today       Patrick's Maneuver: evaluation deferred today                    Gait & Posture Assessment  Ambulation: Patient came in today in a wheel chair Gait: Relatively normal for age and body habitus Posture: WNL   Lower Extremity Exam    Side: Right lower extremity  Side: Left lower extremity  Skin & Extremity Inspection: Healing incision no active drainage or erythema  Skin & Extremity Inspection: Skin color, temperature, and hair growth  are WNL. No peripheral edema or cyanosis. No masses, redness, swelling, asymmetry, or associated skin lesions. No contractures.  Functional ROM: Adequate ROM for hip joint  Functional ROM: Unrestricted ROM          Muscle Tone/Strength: Functionally intact. No obvious neuro-muscular anomalies detected.  Muscle Tone/Strength: Functionally intact. No obvious neuro-muscular anomalies detected.  Sensory (Neurological): Unimpaired  Sensory (Neurological): Unimpaired  Palpation: No palpable anomalies  Palpation: No palpable anomalies   Assessment  Primary Diagnosis & Pertinent Problem List: The primary encounter diagnosis was Chronic low back pain (Location of Secondary source of pain) (Bilateral) (R>L). Diagnoses of Chronic lower extremity pain (Location of Primary Source of Pain) (  Right), Chronic radicular lumbar pain (Right), Musculoskeletal pain, Vitamin D deficiency, and Chronic pain syndrome were also pertinent to this visit.  Status Diagnosis  Controlled Controlled Controlled 1. Chronic low back pain (Location of Secondary source of pain) (Bilateral) (R>L)   2. Chronic lower extremity pain (Location of Primary Source of Pain) (Right)   3. Chronic radicular lumbar pain (Right)   4. Musculoskeletal pain   5. Vitamin D deficiency   6. Chronic pain syndrome     Problems updated and reviewed during this visit: No problems updated. Plan of Care  Pharmacotherapy (Medications Ordered): Meds ordered this encounter  Medications  . methocarbamol (ROBAXIN) 750 MG tablet    Sig: Take 1 tablet (750 mg total) by mouth 3 (three) times daily as needed for muscle spasms.    Dispense:  90 tablet    Refill:  2    Do not add this medication to the electronic "Automatic Refill" notification system. Patient may have prescription filled one day early if pharmacy is closed on scheduled refill date.    Order Specific Question:   Supervising Provider    Answer:   Milinda Pointer [phone]  . Cholecalciferol  (VITAMIN D3) 2000 units capsule    Sig: Take 1 capsule (2,000 Units total) by mouth daily.    Dispense:  30 capsule    Refill:  12    Do not place this medication, or any other prescription from our practice, on "Automatic Refill".    Order Specific Question:   Supervising Provider    Answer:   Milinda Pointer [phone]  . MAGNESIUM-OXIDE 400 (241.3 Mg) MG tablet    Sig: Take 1 tablet (400 mg total) by mouth daily. Reported on 11/07/2015    Dispense:  90 tablet    Refill:  3    Do not add this medication to the electronic "Automatic Refill" notification system. Patient may have prescription filled one day early if pharmacy is closed on scheduled refill date.    Order Specific Question:   Supervising Provider    Answer:   Milinda Pointer [phone]  . HYDROcodone-acetaminophen (NORCO/VICODIN) 5-325 MG tablet    Sig: Take 1 tablet by mouth every 8 (eight) hours as needed for severe pain.    Dispense:  90 tablet    Refill:  0    Do not place this medication, or any other prescription from our practice, on "Automatic Refill". Patient may have prescription filled one day early if pharmacy is closed on scheduled refill date. Do not fill until: 10/15/2017 To last until: 11/14/2017    Order Specific Question:   Supervising Provider    Answer:   Milinda Pointer [phone]  . HYDROcodone-acetaminophen (NORCO/VICODIN) 5-325 MG tablet    Sig: Take 1 tablet by mouth every 8 (eight) hours as needed for severe pain.    Dispense:  90 tablet    Refill:  0    Do not place this medication, or any other prescription from our practice, on "Automatic Refill". Patient may have prescription filled one day early if pharmacy is closed on scheduled refill date. Do not fill until:09/15/2017 To last until: 10/15/2017    Order Specific Question:   Supervising Provider    Answer:   Milinda Pointer [phone]  . HYDROcodone-acetaminophen (NORCO/VICODIN) 5-325 MG tablet    Sig: Take 1 tablet by mouth every 8 (eight)  hours as needed for severe pain.    Dispense:  90 tablet    Refill:  0    Do not  place this medication, or any other prescription from our practice, on "Automatic Refill". Patient may have prescription filled one day early if pharmacy is closed on scheduled refill date. Do not fill until:08/16/2017 To last until: 09/15/2017    Order Specific Question:   Supervising Provider    Answer:   Milinda Pointer [phone]  This SmartLink is deprecated. Use AVSMEDLIST instead to display the medication list for a patient. Medications administered today: Simya F. Levier had no medications administered during this visit. Lab-work, procedure(s), and/or referral(s): No orders of the defined types were placed in this encounter.  Imaging and/or referral(s): None  Interventional therapies: Planned, scheduled, and/or pending:  Not at this time.   Considering:  Diagnostic right L4-5 transforaminal epidural steroid injection  Diagnostic right L4-5 lumbar epidural steroid injection  Diagnostic bilateral lumbar facet block  Possible bilateral lumbar facet radiofrequency ablation    Palliative PRN treatment(s):  Diagnostic right L4-5 transforaminal epidural steroid injection Diagnostic right L4-5 lumbar epidural steroid injection  Diagnostic bilateral lumbar facet block       Provider-requested follow-up: Return in about 3 months (around 10/21/2017) for MedMgmt with Me Dionisio David).  Future Appointments  Date Time Provider [place]  08/01/2017  1:45 PM Glean Hess, MD [facility] None  10/21/2017  1:30 PM Vevelyn Francois, NP [facility] None  03/20/2018  9:30 AM Glean Hess, MD [facility] None  03/20/2018 10:00 AM [facility] None  03/20/2018 10:30 AM [facility] None  03/20/2018 11:30 AM Dew, Erskine Squibb, MD [facility] None   Primary Care Physician: Glean Hess, MD Location: [facility] Note by: Vevelyn Francois NP Date: 07/23/2017; Time:  4:01 PM  Pain Score Disclaimer: We use the NRS-11 scale. This is a self-reported, subjective measurement of pain severity with only modest accuracy. It is used primarily to identify changes within a particular patient. It must be understood that outpatient pain scales are significantly less accurate that those used for research, where they can be applied under ideal controlled circumstances with minimal exposure to variables. In reality, the score is likely to be a combination of pain intensity and pain affect, where pain affect describes the degree of emotional arousal or changes in action readiness caused by the sensory experience of pain. Factors such as social and work situation, setting, emotional state, anxiety levels, expectation, and prior pain experience may influence pain perception and show large inter-individual differences that may also be affected by time variables.  Patient instructions provided during this appointment: Patient Instructions   ____________________________________________________________________________________________  Medication Rules  Applies to: All patients receiving prescriptions (written or electronic).  Pharmacy of record: Pharmacy where electronic prescriptions will be sent. If written prescriptions are taken to a different pharmacy, please inform the nursing staff. The pharmacy listed in the electronic medical record should be the one where you would like electronic prescriptions to be sent.  Prescription refills: Only during scheduled appointments. Applies to both, written and electronic prescriptions.  NOTE: The following applies primarily to controlled substances (Opioid* Pain Medications).   Patient's responsibilities: 1. Pain Pills: Bring all pain pills to every appointment (except for procedure appointments). 2. Pill Bottles: Bring pills in original pharmacy bottle. Always bring newest bottle. Bring bottle, even if empty. 3. Medication refills: You  are responsible for knowing and keeping track of what medications you need refilled. The day before your appointment, write a list of all prescriptions that need to be refilled. Bring that list to  your appointment and give it to the admitting nurse. Prescriptions will be written only during appointments. If you forget a medication, it will not be "Called in", "Faxed", or "electronically sent". You will need to get another appointment to get these prescribed. 4. Prescription Accuracy: You are responsible for carefully inspecting your prescriptions before leaving our office. Have the discharge nurse carefully go over each prescription with you, before taking them home. Make sure that your name is accurately spelled, that your address is correct. Check the name and dose of your medication to make sure it is accurate. Check the number of pills, and the written instructions to make sure they are clear and accurate. Make sure that you are given enough medication to last until your next medication refill appointment. 5. Taking Medication: Take medication as prescribed. Never take more pills than instructed. Never take medication more frequently than prescribed. Taking less pills or less frequently is permitted and encouraged, when it comes to controlled substances (written prescriptions).  6. Inform other Doctors: Always inform, all of your healthcare providers, of all the medications you take. 7. Pain Medication from other Providers: You are not allowed to accept any additional pain medication from any other Doctor or Healthcare provider. There are two exceptions to this rule. (see below) In the event that you require additional pain medication, you are responsible for notifying us, as stated below. 8. Medication Agreement: You are responsible for carefully reading and following our Medication Agreement. This must be signed before receiving any prescriptions from our practice. Safely store a copy of your signed  Agreement. Violations to the Agreement will result in no further prescriptions. (Additional copies of our Medication Agreement are available upon request.) 9. Laws, Rules, & Regulations: All patients are expected to follow all Federal and [employer], Rules, [employer]. Ignorance of the Laws does not constitute a valid excuse. The use of any illegal substances is prohibited. 10. Adopted CDC guidelines & recommendations: Target dosing levels will be at or below 60 MME/day. Use of benzodiazepines** is not recommended.  Exceptions: There are only two exceptions to the rule of not receiving pain medications from other Healthcare Providers. 1. Exception #1 (Emergencies): In the event of an emergency (i.e.: accident requiring emergency care), you are allowed to receive additional pain medication. However, you are responsible for: As soon as you are able, call our office [phone], at any time of the day or night, and leave a message stating your name, the date and nature of the emergency, and the name and dose of the medication prescribed. In the event that your call is answered by a member of our staff, make sure to document and save the date, time, and the name of the person that took your information.  2. Exception #2 (Planned Surgery): In the event that you are scheduled by another doctor or dentist to have any type of surgery or procedure, you are allowed (for a period no longer than 30 days), to receive additional pain medication, for the acute post-op pain. However, in this case, you are responsible for picking up a copy of our "Post-op Pain Management for Surgeons" handout, and giving it to your surgeon or dentist. This document is available at our office, and does not require an appointment to obtain it. Simply go to our office during business hours (Monday-Thursday from 8:00 AM to 4:00 PM) (Friday 8:00 AM to 12:00 Noon) or if you have a scheduled appointment with [place], prior to your surgery,  and ask for it by name. In addition, you will need to provide [place] with your name, name of your surgeon, type of surgery, and date of procedure or surgery.  *Opioid medications include: morphine, codeine, oxycodone, oxymorphone, hydrocodone, hydromorphone, meperidine, tramadol, tapentadol, buprenorphine, fentanyl, methadone. **Benzodiazepine medications include: diazepam (Valium), alprazolam (Xanax), clonazepam (Klonopine), lorazepam (Ativan), clorazepate (Tranxene), chlordiazepoxide (Librium), estazolam (Prosom), oxazepam (Serax), temazepam (Restoril), triazolam (Halcion)  ____________________________________________________________________________________________

## 2017-08-01 ENCOUNTER — Encounter: Payer: Self-pay | Admitting: Internal Medicine

## 2017-08-01 ENCOUNTER — Ambulatory Visit (INDEPENDENT_AMBULATORY_CARE_PROVIDER_SITE_OTHER): Payer: Medicare Other | Admitting: Internal Medicine

## 2017-08-01 VITALS — BP 138/76 | HR 80 | Ht 66.0 in | Wt 107.0 lb

## 2017-08-01 DIAGNOSIS — I10 Essential (primary) hypertension: Secondary | ICD-10-CM

## 2017-08-01 DIAGNOSIS — Z9889 Other specified postprocedural states: Secondary | ICD-10-CM

## 2017-08-01 DIAGNOSIS — F3341 Major depressive disorder, recurrent, in partial remission: Secondary | ICD-10-CM

## 2017-08-01 MED ORDER — MIRTAZAPINE 15 MG PO TABS: 15.0000 mg | ORAL_TABLET | Freq: Every day | ORAL | 5 refills | Status: DC

## 2017-08-01 NOTE — Progress Notes (Signed)
Date:  08/01/2017   Name:  Tasha Reyes   DOB:  [birth year]   MRN:  [id number]   Chief Complaint: Hypertension Hypertension  This is a chronic problem. The problem is controlled. Pertinent negatives include no chest pain, headaches, palpitations or shortness of breath. Past treatments include beta blockers. The current treatment provides significant improvement.   Depression - doing well on Remeron.  Weight is down a few pounds but appetite is stable.  Hip Fracture - fell out of bed at home in December and fractured right hip.  Had uncomplicated surgical repair, went to rehab for 10 days and now back home.  Doing well, taking minimal pain medication.   Review of Systems  Constitutional: Negative for chills, fatigue and unexpected weight change.  Respiratory: Positive for cough. Negative for chest tightness, shortness of breath and wheezing.   Cardiovascular: Negative for chest pain, palpitations and leg swelling.  Gastrointestinal: Negative for abdominal pain and constipation.  Genitourinary: Negative for dysuria.  Musculoskeletal: Positive for arthralgias and gait problem (using rollator after hip surgery).  Skin: Negative for rash and wound.  Neurological: Negative for dizziness, tremors and headaches.  Psychiatric/Behavioral: Negative for dysphoric mood and sleep disturbance.    Patient Active Problem List   Diagnosis Date Noted  . Hip fracture ([place]) 06/16/2017  . AAA (abdominal aortic aneurysm) without rupture ([place]) 03/18/2017  . Edema due to malnutrition ([place]) 10/28/2016  . Dependence on supplemental oxygen 09/04/2016  . Protein-calorie malnutrition, severe 08/20/2016  . Nausea and vomiting 07/30/2016  . Vitamin D deficiency 01/10/2016  . Opioid-induced constipation ([facility]) 11/07/2015  . Osteoporosis, post-menopausal 11/07/2015  . GERD (gastroesophageal reflux disease) 11/07/2015  . Lumbar foraminal stenosis (Right) (Severe at L4-5) 06/20/2015  . Chronic low back  pain (Location of Secondary source of pain) (Bilateral) (R>L) 05/11/2015  . Chronic lower extremity pain (Location of Primary Source of Pain) (Right) 05/11/2015  . Lumbosacral Radiculopathy (Right) 05/11/2015  . Chronic radicular lumbar pain (Right) 05/11/2015  . Long term current use of opiate analgesic 05/11/2015  . Long term prescription opiate use 05/11/2015  . Opiate use 05/11/2015  . Peripheral vascular disease ([place]) 05/11/2015  . Lumbar spondylosis 05/11/2015  . Hx of long term use of blood thinners (Plavix) 05/11/2015  . Lower extremity pain (Right) 05/11/2015  . Chronic pain syndrome 05/11/2015  . Neurogenic pain 05/11/2015  . History of DVT (deep vein thrombosis) 05/11/2015  . Lumbar facet hypertrophy 05/11/2015  . Facet syndrome, lumbar ([place]) 05/11/2015  . Dyslipidemia 12/23/2014  . Compulsive tobacco user syndrome 12/23/2014  . Chronic peptic ulcer with bleeding 12/23/2014  . Essential (primary) hypertension 12/23/2014  . Depression, major, recurrent, in partial remission ([place]) 12/23/2014  . COPD (chronic obstructive pulmonary disease) ([place]) 12/23/2014  . Vascular disorder of lower extremity 12/23/2014  . Restless leg 12/23/2014  . Chronic tension-type headache, intractable 12/23/2014    Prior to Admission medications   Medication Sig Start Date End Date Taking? Authorizing Provider  acetaminophen (TYLENOL) 325 MG tablet Take 2 tablets (650 mg total) by mouth every 6 (six) hours as needed for mild pain (or Fever >/= 101). 06/20/17   Dustin Flock, MD  alum & mag hydroxide-simeth (MAALOX/MYLANTA) 200-200-20 MG/5ML suspension Take 30 mLs by mouth every 4 (four) hours as needed for indigestion. 06/20/17   Dustin Flock, MD  atenolol (TENORMIN) 25 MG tablet TAKE 1 TABLET(25 MG) BY MOUTH TWICE DAILY 06/29/17   Glean Hess, MD  atorvastatin (LIPITOR) 10 MG tablet TAKE 1  TABLET BY MOUTH EVERY NIGHT AT BEDTIME 06/05/17   Glean Hess, MD  Cholecalciferol (VITAMIN D3)  2000 units capsule Take 1 capsule (2,000 Units total) by mouth daily. 07/23/17   Vevelyn Francois, NP  clopidogrel (PLAVIX) 75 MG tablet TAKE 1 TABLET BY MOUTH DAILY 02/26/17   Algernon Huxley, MD  enoxaparin (LOVENOX) 40 MG/0.4ML injection Inject 0.4 mLs (40 mg total) into the skin daily for 14 days. 06/20/17 07/04/17  Dustin Flock, MD  ferrous sulfate 325 (65 FE) MG tablet TAKE 1 TABLET(325 MG) BY MOUTH DAILY 01/01/17   Glean Hess, MD  HYDROcodone-acetaminophen (NORCO/VICODIN) 5-325 MG tablet Take 1 tablet by mouth every 8 (eight) hours as needed for severe pain. 10/15/17 11/14/17  Vevelyn Francois, NP  HYDROcodone-acetaminophen (NORCO/VICODIN) 5-325 MG tablet Take 1 tablet by mouth every 8 (eight) hours as needed for severe pain. 09/15/17 10/15/17  Vevelyn Francois, NP  HYDROcodone-acetaminophen (NORCO/VICODIN) 5-325 MG tablet Take 1 tablet by mouth every 8 (eight) hours as needed for severe pain. 08/16/17 09/15/17  Vevelyn Francois, NP  MAGNESIUM-OXIDE 400 (241.3 Mg) MG tablet Take 1 tablet (400 mg total) by mouth daily. Reported on 11/07/2015 08/16/17 11/14/17  Vevelyn Francois, NP  methocarbamol (ROBAXIN) 750 MG tablet Take 1 tablet (750 mg total) by mouth 3 (three) times daily as needed for muscle spasms. 08/16/17 11/14/17  Vevelyn Francois, NP  mirtazapine (REMERON) 15 MG tablet Take 15 mg by mouth at bedtime.  04/15/17   [provider]  ondansetron (ZOFRAN) 4 MG tablet Take 1 tablet (4 mg total) by mouth every 6 (six) hours as needed for nausea. 06/20/17   Dustin Flock, MD  oxyCODONE-acetaminophen (PERCOCET/ROXICET) 5-325 MG tablet Take 1 tablet by mouth every 4 (four) hours as needed for moderate pain. 06/20/17   Dustin Flock, MD  pantoprazole (PROTONIX) 40 MG tablet TAKE 1 TABLET(40 MG) BY MOUTH TWICE DAILY 11/06/16   Glean Hess, MD  polyethylene glycol powder (GLYCOLAX/MIRALAX) powder MIX AND TAKE 17 GRAMS BY MOUTH prn 01/30/16   [provider]    No Known Allergies  Past Surgical  History:  Procedure Laterality Date  . ABDOMINAL AORTIC ANEURYSM REPAIR W/ ENDOLUMINAL GRAFT  2008  . ABDOMINAL HYSTERECTOMY    . APPENDECTOMY    . BACK SURGERY    . blood clot      removal right leg  . COLONOSCOPY  2012  . ESOPHAGOGASTRODUODENOSCOPY  04/2013   gastric ulcers  . FEMORAL-POPLITEAL BYPASS GRAFT Right 2008  . HIP ARTHROPLASTY Right 06/17/2017   Procedure: ARTHROPLASTY BIPOLAR HIP (HEMIARTHROPLASTY);  Surgeon: Earnestine Leys, MD;  Location: [facility] ORS;  Service: Orthopedics;  Laterality: Right;  . JOINT REPLACEMENT Right 06/19/2017   right hip FX.  Marland Kitchen PARTIAL HYSTERECTOMY      Social History   Tobacco Use  . Smoking status: Current Every Day Smoker    Packs/day: 0.25    Years: 61.00    Pack years: 15.25    Types: Cigarettes  . Smokeless tobacco: Never Used  . Tobacco comment: 3 cigrettes a day- cut back  Substance Use Topics  . Alcohol use: No    Alcohol/week: 0.0 oz  . Drug use: No     Medication list has been reviewed and updated.  PHQ 2/9 Scores 08/01/2017 07/23/2017 04/30/2017 04/22/2017  PHQ - 2 Score 0 0 0 0  PHQ- 9 Score 0 - 0 -    Physical Exam  Constitutional: She is oriented to person, place,  and time. She appears well-developed. No distress.  HENT:  Head: Normocephalic and atraumatic.  Neck: Normal range of motion. Neck supple.  Cardiovascular: Normal rate, regular rhythm and normal heart sounds.  Pulmonary/Chest: Effort normal. No respiratory distress. She has no wheezes.  Musculoskeletal: She exhibits no edema or tenderness.  Neurological: She is alert and oriented to person, place, and time.  Skin: Skin is warm and dry. No rash noted.  Psychiatric: She has a normal mood and affect. Her behavior is normal. Thought content normal.  Nursing note and vitals reviewed.   BP 138/76   Pulse 80   Ht 5\' 6"  (1.676 m)   Wt 107 lb (48.5 kg)   SpO2 91%   BMI 17.27 kg/m   Assessment and Plan: 1. Essential (primary) hypertension controlled  2.  Depression, major, recurrent, in partial remission ([place]) Doing well - mirtazapine (REMERON) 15 MG tablet; Take 1 tablet (15 mg total) by mouth at bedtime.  Dispense: 30 tablet; Refill: 5  3. History of repair of hip fracture Recovering nicely - walking with rollator   Meds ordered this encounter  Medications  . mirtazapine (REMERON) 15 MG tablet    Sig: Take 1 tablet (15 mg total) by mouth at bedtime.    Dispense:  30 tablet    Refill:  5    Partially dictated using Editor, commissioning. Any errors are unintentional.  Halina Maidens, MD [facility]  08/01/2017

## 2017-08-06 DIAGNOSIS — M25551 Pain in right hip: Secondary | ICD-10-CM | POA: Diagnosis not present

## 2017-08-12 NOTE — Telephone Encounter (Signed)
Patient is scheduled for this September and August for wellness and cpe.

## 2017-09-06 ENCOUNTER — Encounter: Payer: Self-pay | Admitting: Emergency Medicine

## 2017-09-06 ENCOUNTER — Observation Stay
Admission: EM | Admit: 2017-09-06 | Discharge: 2017-09-08 | Disposition: A | Discharge status: Home / Self Care | Payer: Medicare Other | Attending: Internal Medicine | Admitting: Internal Medicine

## 2017-09-06 ENCOUNTER — Other Ambulatory Visit: Payer: Self-pay

## 2017-09-06 ENCOUNTER — Emergency Department: Payer: Medicare Other

## 2017-09-06 DIAGNOSIS — J449 Chronic obstructive pulmonary disease, unspecified: Secondary | ICD-10-CM | POA: Insufficient documentation

## 2017-09-06 DIAGNOSIS — Z8673 Personal history of transient ischemic attack (TIA), and cerebral infarction without residual deficits: Secondary | ICD-10-CM | POA: Diagnosis not present

## 2017-09-06 DIAGNOSIS — K449 Diaphragmatic hernia without obstruction or gangrene: Secondary | ICD-10-CM | POA: Insufficient documentation

## 2017-09-06 DIAGNOSIS — E7800 Pure hypercholesterolemia, unspecified: Secondary | ICD-10-CM | POA: Diagnosis not present

## 2017-09-06 DIAGNOSIS — Z79899 Other long term (current) drug therapy: Secondary | ICD-10-CM | POA: Insufficient documentation

## 2017-09-06 DIAGNOSIS — K5730 Diverticulosis of large intestine without perforation or abscess without bleeding: Secondary | ICD-10-CM | POA: Diagnosis not present

## 2017-09-06 DIAGNOSIS — R9431 Abnormal electrocardiogram [ECG] [EKG]: Secondary | ICD-10-CM | POA: Diagnosis not present

## 2017-09-06 DIAGNOSIS — Z8719 Personal history of other diseases of the digestive system: Secondary | ICD-10-CM | POA: Diagnosis not present

## 2017-09-06 DIAGNOSIS — K922 Gastrointestinal hemorrhage, unspecified: Secondary | ICD-10-CM

## 2017-09-06 DIAGNOSIS — I10 Essential (primary) hypertension: Secondary | ICD-10-CM | POA: Insufficient documentation

## 2017-09-06 DIAGNOSIS — R1032 Left lower quadrant pain: Secondary | ICD-10-CM | POA: Diagnosis not present

## 2017-09-06 DIAGNOSIS — I714 Abdominal aortic aneurysm, without rupture: Secondary | ICD-10-CM | POA: Diagnosis not present

## 2017-09-06 DIAGNOSIS — Z86718 Personal history of other venous thrombosis and embolism: Secondary | ICD-10-CM | POA: Diagnosis not present

## 2017-09-06 DIAGNOSIS — I739 Peripheral vascular disease, unspecified: Secondary | ICD-10-CM | POA: Diagnosis not present

## 2017-09-06 DIAGNOSIS — Z7902 Long term (current) use of antithrombotics/antiplatelets: Secondary | ICD-10-CM | POA: Diagnosis not present

## 2017-09-06 DIAGNOSIS — K625 Hemorrhage of anus and rectum: Secondary | ICD-10-CM | POA: Diagnosis not present

## 2017-09-06 DIAGNOSIS — F17210 Nicotine dependence, cigarettes, uncomplicated: Secondary | ICD-10-CM | POA: Insufficient documentation

## 2017-09-06 DIAGNOSIS — Z8679 Personal history of other diseases of the circulatory system: Secondary | ICD-10-CM | POA: Insufficient documentation

## 2017-09-06 DIAGNOSIS — E785 Hyperlipidemia, unspecified: Secondary | ICD-10-CM | POA: Diagnosis not present

## 2017-09-06 LAB — COMPREHENSIVE METABOLIC PANEL
ALT: 10 U/L — AB (ref 14–54)
AST: 21 U/L (ref 15–41)
Albumin: 4.1 g/dL (ref 3.5–5.0)
Alkaline Phosphatase: 65 U/L (ref 38–126)
Anion gap: 10 (ref 5–15)
BILIRUBIN TOTAL: 0.4 mg/dL (ref 0.3–1.2)
BUN: 13 mg/dL (ref 6–20)
CHLORIDE: 100 mmol/L — AB (ref 101–111)
CO2: 27 mmol/L (ref 22–32)
Calcium: 8.9 mg/dL (ref 8.9–10.3)
Creatinine, Ser: 0.84 mg/dL (ref 0.44–1.00)
GFR calc Af Amer: >60 mL/min (ref >60)
GFR calc non Af Amer: >60 mL/min (ref >60)
Glucose, Bld: 160 mg/dL — ABNORMAL HIGH (ref 65–99)
POTASSIUM: 4.2 mmol/L (ref 3.5–5.1)
Sodium: 137 mmol/L (ref 135–145)
Total Protein: 7.6 g/dL (ref 6.5–8.1)

## 2017-09-06 LAB — CBC
HEMATOCRIT: 37.4 % (ref 35.0–47.0)
Hemoglobin: 12.8 g/dL (ref 12.0–16.0)
MCH: 31.6 pg (ref 26.0–34.0)
MCHC: 34.3 g/dL (ref 32.0–36.0)
MCV: 92 fL (ref 80.0–100.0)
PLATELETS: 460 10*3/uL — AB (ref 150–440)
RBC: 4.06 MIL/uL (ref 3.80–5.20)
RDW: 14.8 % — AB (ref 11.5–14.5)
WBC: 12.2 10*3/uL — ABNORMAL HIGH (ref 3.6–11.0)

## 2017-09-06 LAB — HEMOGLOBIN: Hemoglobin: 10.7 g/dL — ABNORMAL LOW (ref 12.0–16.0)

## 2017-09-06 LAB — PROTIME-INR
INR: 0.97
Prothrombin Time: 12.8 seconds (ref 11.4–15.2)

## 2017-09-06 MED ORDER — ACETAMINOPHEN 325 MG PO TABS: 650.0000 mg | ORAL_TABLET | Freq: Four times a day (QID) | ORAL | Status: DC | PRN

## 2017-09-06 MED ORDER — MORPHINE SULFATE (PF) 4 MG/ML IV SOLN
4.0000 mg | Freq: Once | INTRAVENOUS | Status: AC
  Administered 2017-09-06: 4 mg via INTRAVENOUS
  Filled 2017-09-06: qty 1

## 2017-09-06 MED ORDER — ATORVASTATIN CALCIUM 10 MG PO TABS
10.0000 mg | ORAL_TABLET | Freq: Every day | ORAL | Status: DC
  Administered 2017-09-07 (×2): 10 mg via ORAL
  Filled 2017-09-06 (×2): qty 1

## 2017-09-06 MED ORDER — ONDANSETRON HCL 4 MG PO TABS: 4.0000 mg | ORAL_TABLET | Freq: Four times a day (QID) | ORAL | Status: DC | PRN

## 2017-09-06 MED ORDER — PREDNISONE 10 MG PO TABS
5.0000 mg | ORAL_TABLET | Freq: Every day | ORAL | Status: DC
  Administered 2017-09-07 – 2017-09-08 (×2): 5 mg via ORAL
  Filled 2017-09-06 (×2): qty 1

## 2017-09-06 MED ORDER — ONDANSETRON HCL 4 MG/2ML IJ SOLN
4.0000 mg | Freq: Once | INTRAMUSCULAR | Status: AC
  Administered 2017-09-06: 4 mg via INTRAVENOUS
  Filled 2017-09-06: qty 2

## 2017-09-06 MED ORDER — SODIUM CHLORIDE 0.9 % IV BOLUS (SEPSIS)
1000.0000 mL | Freq: Once | INTRAVENOUS | Status: AC
  Administered 2017-09-06: 1000 mL via INTRAVENOUS

## 2017-09-06 MED ORDER — METHOCARBAMOL 500 MG PO TABS: 750.0000 mg | ORAL_TABLET | Freq: Three times a day (TID) | ORAL | Status: DC | PRN

## 2017-09-06 MED ORDER — VITAMIN D 1000 UNITS PO TABS
2000.0000 [IU] | ORAL_TABLET | Freq: Every day | ORAL | Status: DC
  Administered 2017-09-07 – 2017-09-08 (×2): 2000 [IU] via ORAL
  Filled 2017-09-06 (×2): qty 2

## 2017-09-06 MED ORDER — IOPAMIDOL (ISOVUE-300) INJECTION 61%
75.0000 mL | Freq: Once | INTRAVENOUS | Status: AC | PRN
  Administered 2017-09-06: 75 mL via INTRAVENOUS

## 2017-09-06 MED ORDER — HYDROCODONE-ACETAMINOPHEN 5-325 MG PO TABS
1.0000 | ORAL_TABLET | Freq: Three times a day (TID) | ORAL | Status: DC | PRN
  Administered 2017-09-07: 1 via ORAL
  Filled 2017-09-06: qty 1

## 2017-09-06 MED ORDER — IOPAMIDOL (ISOVUE-300) INJECTION 61%
15.0000 mL | INTRAVENOUS | Status: AC
  Administered 2017-09-06: 15 mL via ORAL

## 2017-09-06 MED ORDER — MIRTAZAPINE 15 MG PO TABS
15.0000 mg | ORAL_TABLET | Freq: Every day | ORAL | Status: DC
  Administered 2017-09-07 (×2): 15 mg via ORAL
  Filled 2017-09-06 (×2): qty 1

## 2017-09-06 MED ORDER — FERROUS SULFATE 325 (65 FE) MG PO TABS
325.0000 mg | ORAL_TABLET | Freq: Every day | ORAL | Status: DC
  Administered 2017-09-07 – 2017-09-08 (×2): 325 mg via ORAL
  Filled 2017-09-06 (×2): qty 1

## 2017-09-06 MED ORDER — ACETAMINOPHEN 650 MG RE SUPP: 650.0000 mg | Freq: Four times a day (QID) | RECTAL | Status: DC | PRN

## 2017-09-06 MED ORDER — PANTOPRAZOLE SODIUM 40 MG PO TBEC
40.0000 mg | DELAYED_RELEASE_TABLET | Freq: Two times a day (BID) | ORAL | Status: DC
  Administered 2017-09-07 – 2017-09-08 (×4): 40 mg via ORAL
  Filled 2017-09-06 (×4): qty 1

## 2017-09-06 MED ORDER — ONDANSETRON HCL 4 MG/2ML IJ SOLN: 4.0000 mg | Freq: Four times a day (QID) | INTRAMUSCULAR | Status: DC | PRN

## 2017-09-06 NOTE — ED Notes (Signed)
Patient drank one bottle of oral contrast, but couldn't tolerate the 2nd bottle. Dr. Kerman Passey is aware and stated that the patient could go on to CT scan. CT scan tech aware.

## 2017-09-06 NOTE — ED Provider Notes (Signed)
[facility] Emergency Department Provider Note  Time seen: 7:31 PM  I have reviewed the triage vital signs and the nursing notes.   HISTORY  Chief Complaint Rectal Bleeding    HPI Tasha Reyes is a 84 y.o. female with a past medical history of COPD, hypertension, hyperlipidemia, CVA, presents to the emergency department for rectal bleeding.  According to the patient for the past 2 days she has been expensing bright red blood per rectum.  Also states over the past 2 days she has developed progressively worsening left lower quadrant pain which she now rates as a 7/10 dull aching pain.  Patient has a history of a gastric ulcer bleed several years ago per patient.  Denies any fever, cough, congestion, largely negative review of systems otherwise.   Past Medical History:  Diagnosis Date  . Aneurysm of iliac artery (HCC) 05/11/2015  . COPD (chronic obstructive pulmonary disease) ([employer])   . Cystitis   . History of DVT (deep vein thrombosis) 05/11/2015  . History of peptic ulcer disease 05/11/2015  . [name]   . Hypertension   . Losing weight   . Migraines   . [name]   . Stroke ([place])   . Vascular disease     Patient Active Problem List   Diagnosis Date Noted  . History of repair of hip fracture 06/16/2017  . AAA (abdominal aortic aneurysm) without rupture ([employer]) 03/18/2017  . Edema due to malnutrition ([place]) 10/28/2016  . Protein-calorie malnutrition, severe 08/20/2016  . Vitamin D deficiency 01/10/2016  . Opioid-induced constipation (OIC) 11/07/2015  . Osteoporosis, post-menopausal 11/07/2015  . Lumbar foraminal stenosis (Right) (Severe at L4-5) 06/20/2015  . Lumbosacral Radiculopathy (Right) 05/11/2015  . Peripheral vascular disease ([place]) 05/11/2015  . Hx of long term use of blood thinners (Plavix) 05/11/2015  . Lower extremity pain (Right) 05/11/2015  . Neurogenic pain 05/11/2015  . History of DVT (deep vein  thrombosis) 05/11/2015  . Facet syndrome, lumbar ([place]) 05/11/2015  . Dyslipidemia 12/23/2014  . Compulsive tobacco user syndrome 12/23/2014  . Chronic peptic ulcer with bleeding 12/23/2014  . Essential (primary) hypertension 12/23/2014  . Depression, major, recurrent, in partial remission ([place]) 12/23/2014  . COPD (chronic obstructive pulmonary disease) ([place]) 12/23/2014  . Restless leg 12/23/2014  . Chronic tension-type headache, intractable 12/23/2014    Past Surgical History:  Procedure Laterality Date  . ABDOMINAL AORTIC ANEURYSM REPAIR W/ ENDOLUMINAL GRAFT  2008  . ABDOMINAL HYSTERECTOMY    . APPENDECTOMY    . BACK SURGERY    . blood clot      removal right leg  . COLONOSCOPY  2012  . ESOPHAGOGASTRODUODENOSCOPY  04/2013   gastric ulcers  . FEMORAL-POPLITEAL BYPASS GRAFT Right 2008  . HIP ARTHROPLASTY Right 06/17/2017   Procedure: ARTHROPLASTY BIPOLAR HIP (HEMIARTHROPLASTY);  Surgeon: Earnestine Leys, MD;  Location: [facility] ORS;  Service: Orthopedics;  Laterality: Right;  . JOINT REPLACEMENT Right 06/19/2017   right hip FX.  Marland Kitchen PARTIAL HYSTERECTOMY      Prior to Admission medications   Medication Sig Start Date End Date Taking? Authorizing Provider  atenolol (TENORMIN) 25 MG tablet TAKE 1 TABLET(25 MG) BY MOUTH TWICE DAILY 06/29/17  Yes Glean Hess, MD  atorvastatin (LIPITOR) 10 MG tablet TAKE 1 TABLET BY MOUTH EVERY NIGHT AT BEDTIME 06/05/17  Yes Glean Hess, MD  Cholecalciferol (VITAMIN D3) 2000 units capsule Take 1 capsule (2,000 Units total) by mouth daily. 07/23/17  Yes Vevelyn Francois, NP  clopidogrel (  PLAVIX) 75 MG tablet TAKE 1 TABLET BY MOUTH DAILY 02/26/17  Yes Dew, Erskine Squibb, MD  ferrous sulfate 325 (65 FE) MG tablet TAKE 1 TABLET(325 MG) BY MOUTH DAILY 01/01/17  Yes Glean Hess, MD  mirtazapine (REMERON) 15 MG tablet Take 1 tablet (15 mg total) by mouth at bedtime. 08/01/17  Yes Glean Hess, MD  pantoprazole (PROTONIX) 40 MG tablet TAKE 1 TABLET(40 MG)  BY MOUTH TWICE DAILY 11/06/16  Yes Glean Hess, MD  predniSONE (DELTASONE) 5 MG tablet Take 1 tablet by mouth daily. 08/23/17  Yes [provider]  HYDROcodone-acetaminophen (NORCO/VICODIN) 5-325 MG tablet Take 1 tablet by mouth every 8 (eight) hours as needed. 08/25/17   [provider]  methocarbamol (ROBAXIN) 750 MG tablet Take 1 tablet (750 mg total) by mouth 3 (three) times daily as needed for muscle spasms. 08/16/17 11/14/17  Vevelyn Francois, NP  ondansetron (ZOFRAN) 4 MG tablet Take 1 tablet (4 mg total) by mouth every 6 (six) hours as needed for nausea. 06/20/17   Dustin Flock, MD  polyethylene glycol powder (GLYCOLAX/MIRALAX) powder MIX AND TAKE 17 GRAMS BY MOUTH prn 01/30/16   [provider]    No Known Allergies  Family History  Problem Relation Age of Onset  . Brain cancer Father   . Kidney disease Mother     Social History Social History   Tobacco Use  . Smoking status: Current Every Day Smoker    Packs/day: 0.25    Years: 61.00    Pack years: 15.25    Types: Cigarettes  . Smokeless tobacco: Never Used  . Tobacco comment: 3 cigrettes a day- cut back  Substance Use Topics  . Alcohol use: No    Alcohol/week: 0.0 oz  . Drug use: No    Review of Systems Constitutional: Negative for fever. Eyes: Negative for visual complaints ENT: Negative for recent illness/congestion Cardiovascular: Negative for chest pain. Respiratory: Negative for shortness of breath. Gastrointestinal: Left lower quadrant pain, moderate.  Negative for nausea or vomiting.  Positive for loose stool bright red blood. Genitourinary: Negative for urinary compaints Musculoskeletal: Negative for musculoskeletal complaints Skin: Negative for skin complaints  Neurological: Negative for headache All other ROS negative  ____________________________________________   PHYSICAL EXAM:  VITAL SIGNS: ED Triage Vitals  Enc Vitals Group     BP 09/06/17 1909 (!) 118/58      Pulse Rate 09/06/17 1909 (!) 110     Resp 09/06/17 1909 (!) 24     Temp 09/06/17 1909 (!) 97.5 F (36.4 C)     Temp Source 09/06/17 1909 Oral     SpO2 09/06/17 1909 93 %     Weight 09/06/17 1907 105 lb (47.6 kg)     Height 09/06/17 1907 5\' 6"  (1.676 m)     Head Circumference --      Peak Flow --      Pain Score 09/06/17 1906 7     Pain Loc --      Pain Edu? --      Excl. in [place]? --     Constitutional: Alert and oriented. Well appearing and in no distress. Eyes: Normal exam ENT   Head: Normocephalic and atraumatic.   Mouth/Throat: Mucous membranes are moist. Cardiovascular: Normal rate, regular rhythm. No murmur Respiratory: Normal respiratory effort without tachypnea nor retractions. Breath sounds are clear  Gastrointestinal: Soft, moderate left lower quadrant tenderness to palpation.  No rebound or guarding.  No distention.  Abdomen otherwise benign. Musculoskeletal:  Nontender with normal range of motion in all extremities.  Neurologic:  Normal speech and language. No gross focal neurologic deficits  Skin:  Skin is warm, dry and intact.  Psychiatric: Mood and affect are normal.   ____________________________________________    EKG  EKG reviewed and interpreted by myself shows normal sinus rhythm at 92 bpm with a widened QRS, left axis deviation, otherwise normal intervals, nonspecific ST changes.  ____________________________________________    FUXNATFTD  CT pending  ____________________________________________   INITIAL IMPRESSION / ASSESSMENT AND PLAN / ED COURSE  Pertinent labs & imaging results that were available during my care of the patient were reviewed by me and considered in my medical decision making (see chart for details).  Patient presents to the emergency department with rectal bleeding for the past 2 days and progressively worsening left lower quadrant pain.  Patient has moderate left lower quadrant tenderness to palpation without rebound or  guarding.  Abdomen otherwise benign.  Differential this time would include GI bleed, upper GI bleed versus lower GI bleed, colitis, diverticulitis.  Given rectal exam showing hematochezia suspect lower GI bleed with left lower quadrant tenderness we will obtain a CT scan to evaluate for colitis or diverticulitis.  We will obtain lab work and closely monitor.  We will dose pain, nausea medication and IV hydrate while awaiting results.  Patient agreeable to plan of care.  Labs are largely within normal limits.  Stable/normal H&H.  However given hematochezia on rectal examination patient will be admitted to the hospitalist service for further treatment.  CT scan pending.  I discussed admission with the hospitalist.  ____________________________________________   FINAL CLINICAL IMPRESSION(S) / ED DIAGNOSES  Left lower quadrant pain Rectal bleeding    Harvest Dark, MD 09/06/17 2012

## 2017-09-06 NOTE — ED Notes (Signed)
B/P cuff removed for patient's comfort for a short time.

## 2017-09-06 NOTE — ED Notes (Signed)
Dr. Verdell Carmine at bedside.

## 2017-09-06 NOTE — ED Triage Notes (Signed)
Pt arrives POV to triage with c/o rectal bleeding x2 days. Pt is pale at this time in triage. Pt has hx of admission for the same.

## 2017-09-06 NOTE — H&P (Signed)
[place] [facility] [place] NAME: Tasha Reyes    MR#:  [id number]  DATE OF BIRTH:  02-24-1931  DATE OF ADMISSION:  09/06/2017  PRIMARY CARE PHYSICIAN: Glean Hess, MD   REQUESTING/REFERRING PHYSICIAN: Dr. Harvest Dark  CHIEF COMPLAINT:   Chief Complaint  Patient presents with  . Rectal Bleeding    HISTORY OF PRESENT ILLNESS:  Tasha Reyes  is a 84 y.o. female with a known history of peripheral vascular disease, previous history of peptic ulcer disease, CVA, hypertension, hyperlipidemia, previous history of DVT who presents to the hospital due to multiple episodes of rectal bleeding. Patient says she developed rectal bleeding late yesterday afternoon and it persisted throughout today and therefore she came to the ER for further evaluation. She has some nausea associated with the bleeding along with some left lower quadrant abdominal pain. She denies any fevers chills or any other associated symptoms presently. She does have a previous history of peptic ulcer disease and diverticulosis. Patient presented to the emergency room was noted to have a stable hemoglobin but due to her ongoing bleeding hospitalist services were contacted further treatment and evaluation.  PAST MEDICAL HISTORY:   Past Medical History:  Diagnosis Date  . Aneurysm of iliac artery (HCC) 05/11/2015  . COPD (chronic obstructive pulmonary disease) ([place])   . Cystitis   . History of DVT (deep vein thrombosis) 05/11/2015  . History of peptic ulcer disease 05/11/2015  . [name]   . Hypertension   . Losing weight   . Migraines   . [name]   . Stroke ([place])   . Vascular disease     PAST SURGICAL HISTORY:   Past Surgical History:  Procedure Laterality Date  . ABDOMINAL AORTIC ANEURYSM REPAIR W/ ENDOLUMINAL GRAFT  2008  . ABDOMINAL HYSTERECTOMY    . APPENDECTOMY    . BACK SURGERY    . blood clot      removal right leg  .  COLONOSCOPY  2012  . ESOPHAGOGASTRODUODENOSCOPY  04/2013   gastric ulcers  . FEMORAL-POPLITEAL BYPASS GRAFT Right 2008  . HIP ARTHROPLASTY Right 06/17/2017   Procedure: ARTHROPLASTY BIPOLAR HIP (HEMIARTHROPLASTY);  Surgeon: Earnestine Leys, MD;  Location: [facility] ORS;  Service: Orthopedics;  Laterality: Right;  . JOINT REPLACEMENT Right 06/19/2017   right hip FX.  Marland Kitchen PARTIAL HYSTERECTOMY      SOCIAL HISTORY:   Social History   Tobacco Use  . Smoking status: Current Every Day Smoker    Packs/day: 0.25    Years: 61.00    Pack years: 15.25    Types: Cigarettes  . Smokeless tobacco: Never Used  . Tobacco comment: 3 cigrettes a day- cut back  Substance Use Topics  . Alcohol use: No    Alcohol/week: 0.0 oz    FAMILY HISTORY:   Family History  Problem Relation Age of Onset  . Brain cancer Father   . Kidney disease Mother   . Colon cancer Brother     DRUG ALLERGIES:  No Known Allergies  REVIEW OF SYSTEMS:   Review of Systems  Constitutional: Negative for fever and weight loss.  HENT: Negative for congestion, nosebleeds and tinnitus.   Eyes: Negative for blurred vision, double vision and redness.  Respiratory: Negative for cough, hemoptysis and shortness of breath.   Cardiovascular: Negative for chest pain, orthopnea, leg swelling and PND.  Gastrointestinal: Positive for abdominal pain, blood in stool and nausea. Negative for diarrhea, melena and vomiting.  Genitourinary: Negative  for dysuria, hematuria and urgency.  Musculoskeletal: Negative for falls and joint pain.  Neurological: Negative for dizziness, tingling, sensory change, focal weakness, seizures, weakness and headaches.  Endo/Heme/Allergies: Negative for polydipsia. Does not bruise/bleed easily.  Psychiatric/Behavioral: Negative for depression and memory loss. The patient is not nervous/anxious.     MEDICATIONS AT HOME:   Prior to Admission medications   Medication Sig Start Date End Date Taking? Authorizing  Provider  atenolol (TENORMIN) 25 MG tablet TAKE 1 TABLET(25 MG) BY MOUTH TWICE DAILY 06/29/17  Yes Glean Hess, MD  atorvastatin (LIPITOR) 10 MG tablet TAKE 1 TABLET BY MOUTH EVERY NIGHT AT BEDTIME 06/05/17  Yes Glean Hess, MD  Cholecalciferol (VITAMIN D3) 2000 units capsule Take 1 capsule (2,000 Units total) by mouth daily. 07/23/17  Yes Vevelyn Francois, NP  clopidogrel (PLAVIX) 75 MG tablet TAKE 1 TABLET BY MOUTH DAILY 02/26/17  Yes Dew, Erskine Squibb, MD  ferrous sulfate 325 (65 FE) MG tablet TAKE 1 TABLET(325 MG) BY MOUTH DAILY 01/01/17  Yes Glean Hess, MD  mirtazapine (REMERON) 15 MG tablet Take 1 tablet (15 mg total) by mouth at bedtime. 08/01/17  Yes Glean Hess, MD  pantoprazole (PROTONIX) 40 MG tablet TAKE 1 TABLET(40 MG) BY MOUTH TWICE DAILY 11/06/16  Yes Glean Hess, MD  predniSONE (DELTASONE) 5 MG tablet Take 1 tablet by mouth daily. 08/23/17  Yes [provider]  HYDROcodone-acetaminophen (NORCO/VICODIN) 5-325 MG tablet Take 1 tablet by mouth every 8 (eight) hours as needed. 08/25/17   [provider]  methocarbamol (ROBAXIN) 750 MG tablet Take 1 tablet (750 mg total) by mouth 3 (three) times daily as needed for muscle spasms. 08/16/17 11/14/17  Vevelyn Francois, NP  ondansetron (ZOFRAN) 4 MG tablet Take 1 tablet (4 mg total) by mouth every 6 (six) hours as needed for nausea. 06/20/17   Dustin Flock, MD  polyethylene glycol powder (GLYCOLAX/MIRALAX) powder MIX AND TAKE 17 GRAMS BY MOUTH prn 01/30/16   [provider]      VITAL SIGNS:  Blood pressure 128/69, pulse 81, temperature (!) 97.5 F (36.4 C), temperature source Oral, resp. rate 20, height 5\' 6"  (1.676 m), weight 47.6 kg (105 lb), SpO2 100 %.  PHYSICAL EXAMINATION:  Physical Exam  GENERAL:  84 y.o.-year-old patient lying in the bed with no acute distress.  EYES: Pupils equal, round, reactive to light and accommodation. No scleral icterus. Extraocular muscles intact.  HEENT: Head  atraumatic, normocephalic. Oropharynx and nasopharynx clear. No oropharyngeal erythema, moist oral mucosa  NECK:  Supple, no jugular venous distention. No thyroid enlargement, no tenderness.  LUNGS: Normal breath sounds bilaterally, no wheezing, rales, rhonchi. No use of accessory muscles of respiration.  CARDIOVASCULAR: S1, S2 RRR. No murmurs, rubs, gallops, clicks.  ABDOMEN: Soft, mildly tender in the left lower quadrant, no rebound or rigidity nondistended. Bowel sounds present. No organomegaly or mass.  EXTREMITIES: No pedal edema, cyanosis, or clubbing. + 2 pedal & radial pulses b/l.   NEUROLOGIC: Cranial nerves II through XII are intact. No focal Motor or sensory deficits appreciated b/l PSYCHIATRIC: The patient is alert and oriented x 3.  SKIN: No obvious rash, lesion, or ulcer.   LABORATORY PANEL:   CBC Recent Labs  Lab 09/06/17 1914  WBC 12.2*  HGB 12.8  HCT 37.4  PLT 460*   ------------------------------------------------------------------------------------------------------------------  Chemistries  Recent Labs  Lab 09/06/17 1914  NA 137  K 4.2  CL 100*  CO2 27  GLUCOSE 160*  BUN  13  CREATININE 0.84  CALCIUM 8.9  AST 21  ALT 10*  ALKPHOS 65  BILITOT 0.4   ------------------------------------------------------------------------------------------------------------------  Cardiac Enzymes No results for input(s): TROPONINI in the last 168 hours. ------------------------------------------------------------------------------------------------------------------  RADIOLOGY:  No results found.   IMPRESSION AND PLAN:   84 year old female with past medical history of peripheral asked disease, previous CVA, previous history of DVT, hypertension, hyperlipidemia, previous history of peptic ulcer disease who presents to the hospital due to multiple episodes of rectal bleeding.  1. GI bleed-this is a lower GI bleed given patient's rectal bleeding. She does have a  previous history of diverticulosis. -Currently hemoglobin is stable. We'll hold Plavix, continue to follow serial hemoglobins.  -Place on a clear liquid diet, get a gastroenterology consult. -If bleeding persists consider getting a nuclear medicine GI bleeding scan.  2. Essential hypertension-we'll go ahead and hold patient's antihypertensives for now given her acute bleeding.  3. Hyperlipidemia-continue atorvastatin.  4. GERD-continue Protonix.  5. Osteoarthritis/tonic pain-continue Robaxin, Vicodin as needed.   All the records are reviewed and case discussed with ED provider. Management plans discussed with the patient, family and they are in agreement.  CODE STATUS: Full code  TOTAL TIME TAKING CARE OF THIS PATIENT: 40 minutes.    Henreitta Leber M.D on 09/06/2017 at 9:23 PM  Between 7am to 6pm - Pager - [phone]  After 6pm go to [email or web address] - password [email or web address] [facility]  [facility]  Office  [phone]  CC: Primary care physician; Glean Hess, MD

## 2017-09-07 ENCOUNTER — Other Ambulatory Visit: Payer: Self-pay | Admitting: Internal Medicine

## 2017-09-07 DIAGNOSIS — K921 Melena: Secondary | ICD-10-CM | POA: Diagnosis not present

## 2017-09-07 DIAGNOSIS — E785 Hyperlipidemia, unspecified: Secondary | ICD-10-CM | POA: Diagnosis not present

## 2017-09-07 DIAGNOSIS — I10 Essential (primary) hypertension: Secondary | ICD-10-CM | POA: Diagnosis not present

## 2017-09-07 DIAGNOSIS — D62 Acute posthemorrhagic anemia: Secondary | ICD-10-CM | POA: Diagnosis not present

## 2017-09-07 DIAGNOSIS — K625 Hemorrhage of anus and rectum: Secondary | ICD-10-CM | POA: Diagnosis not present

## 2017-09-07 DIAGNOSIS — K5730 Diverticulosis of large intestine without perforation or abscess without bleeding: Secondary | ICD-10-CM | POA: Diagnosis not present

## 2017-09-07 DIAGNOSIS — Z8673 Personal history of transient ischemic attack (TIA), and cerebral infarction without residual deficits: Secondary | ICD-10-CM | POA: Diagnosis not present

## 2017-09-07 LAB — HEMOGLOBIN
Hemoglobin: 9.8 g/dL — ABNORMAL LOW (ref 12.0–16.0)
Hemoglobin: 9.6 g/dL — ABNORMAL LOW (ref 12.0–16.0)

## 2017-09-07 LAB — CBC
HCT: 29.3 % — ABNORMAL LOW (ref 35.0–47.0)
Hemoglobin: 9.6 g/dL — ABNORMAL LOW (ref 12.0–16.0)
MCH: 30 pg (ref 26.0–34.0)
MCHC: 32.7 g/dL (ref 32.0–36.0)
MCV: 91.9 fL (ref 80.0–100.0)
PLATELETS: 347 10*3/uL (ref 150–440)
RBC: 3.18 MIL/uL — ABNORMAL LOW (ref 3.80–5.20)
RDW: 14.5 % (ref 11.5–14.5)
WBC: 7.4 10*3/uL (ref 3.6–11.0)

## 2017-09-07 LAB — BASIC METABOLIC PANEL
ANION GAP: 7 (ref 5–15)
BUN: 10 mg/dL (ref 6–20)
CALCIUM: 7.8 mg/dL — AB (ref 8.9–10.3)
CO2: 28 mmol/L (ref 22–32)
CREATININE: 0.68 mg/dL (ref 0.44–1.00)
Chloride: 101 mmol/L (ref 101–111)
GFR calc Af Amer: >60 mL/min (ref >60)
Glucose, Bld: 97 mg/dL (ref 65–99)
Potassium: 4 mmol/L (ref 3.5–5.1)
Sodium: 136 mmol/L (ref 135–145)

## 2017-09-07 MED ORDER — ALBUTEROL SULFATE (2.5 MG/3ML) 0.083% IN NEBU: 3.0000 mL | INHALATION_SOLUTION | Freq: Four times a day (QID) | RESPIRATORY_TRACT | Status: DC | PRN

## 2017-09-07 NOTE — Progress Notes (Signed)
[place] at [facility]                                                                                                                                                                                  Patient Demographics   Tasha Reyes, is a 84 y.o. female, DOB - 02-May-1931, WIO:[id number]  Admit date - 09/06/2017   Admitting Physician Henreitta Leber, MD  Outpatient Primary MD for the patient is Glean Hess, MD   LOS - 0  Subjective:  Patient admitted with bright red blood per rectum No further bleeding since yesterday hemoglobin stable  Patient states that she was started on Plavix after having a "clot in leg " by Dew.    Review of Systems:   CONSTITUTIONAL: No documented fever. No fatigue, weakness. No weight gain, no weight loss.  EYES: No blurry or double vision.  ENT: No tinnitus. No postnasal drip. No redness of the oropharynx.  RESPIRATORY: No cough, no wheeze, no hemoptysis. No dyspnea.  CARDIOVASCULAR: No chest pain. No orthopnea. No palpitations. No syncope.  GASTROINTESTINAL: No nausea, no vomiting or diarrhea. No abdominal pain. No melena or hematochezia.  GENITOURINARY: No dysuria or hematuria.  ENDOCRINE: No polyuria or nocturia. No heat or cold intolerance.  HEMATOLOGY: No anemia. No bruising. No bleeding.  INTEGUMENTARY: No rashes. No lesions.  MUSCULOSKELETAL: No arthritis. No swelling. No gout.  NEUROLOGIC: No numbness, tingling, or ataxia. No seizure-type activity.  PSYCHIATRIC: No anxiety. No insomnia. No ADD.    Vitals:   Vitals:   09/06/17 2306 09/07/17 0348 09/07/17 0804 09/07/17 1518  BP: 121/60 (!) 126/59 (!) 118/54 (!) 120/54  Pulse: 66 60 71 64  Resp: 16 18 16 18   Temp: 98.5 F (36.9 C) 97.8 F (36.6 C) 98.4 F (36.9 C) 98.4 F (36.9 C)  TempSrc: Oral Oral Oral Oral  SpO2: 97% 99% 98% 95%  Weight:      Height:        Wt Readings from Last 3 Encounters:  09/06/17 105 lb (47.6 kg)  08/01/17 107 lb  (48.5 kg)  07/23/17 109 lb (49.4 kg)     Intake/Output Summary (Last 24 hours) at 09/07/2017 1543 Last data filed at 09/07/2017 1051 Gross per 24 hour  Intake 1000 ml  Output -  Net 1000 ml    Physical Exam:   GENERAL: Pleasant-appearing in no apparent distress.  HEAD, EYES, EARS, NOSE AND THROAT: Atraumatic, normocephalic. Extraocular muscles are intact. Pupils equal and reactive to light. Sclerae anicteric. No conjunctival injection. No oro-pharyngeal erythema.  NECK: Supple. There is no jugular venous distention. No bruits, no lymphadenopathy, no thyromegaly.  HEART: Regular rate and  rhythm,. No murmurs, no rubs, no clicks.  LUNGS: Clear to auscultation bilaterally. No rales or rhonchi. No wheezes.  ABDOMEN: Soft, flat, nontender, nondistended. Has good bowel sounds. No hepatosplenomegaly appreciated.  EXTREMITIES: No evidence of any cyanosis, clubbing, or peripheral edema.  +2 pedal and radial pulses bilaterally.  NEUROLOGIC: The patient is alert, awake, and oriented x3 with no focal motor or sensory deficits appreciated bilaterally.  SKIN: Moist and warm with no rashes appreciated.  Psych: Not anxious, depressed LN: No inguinal LN enlargement    Antibiotics   Anti-infectives (From admission, onward)   None      Medications   Scheduled Meds: . atorvastatin  10 mg Oral QHS  . cholecalciferol  2,000 Units Oral Daily  . ferrous sulfate  325 mg Oral Q breakfast  . mirtazapine  15 mg Oral QHS  . pantoprazole  40 mg Oral BID  . predniSONE  5 mg Oral Daily   Continuous Infusions: PRN Meds:.acetaminophen **OR** acetaminophen, albuterol, HYDROcodone-acetaminophen, methocarbamol, ondansetron **OR** ondansetron (ZOFRAN) IV   Data Review:   Micro Results No results found for this or any previous visit (from the past 240 hour(s)).  Radiology Reports Ct Abdomen Pelvis W Contrast  Result Date: 09/06/2017 CLINICAL DATA:  84 year old female with rectal bleeding. EXAM: CT  ABDOMEN AND PELVIS WITH CONTRAST TECHNIQUE: Multidetector CT imaging of the abdomen and pelvis was performed using the standard protocol following bolus administration of intravenous contrast. CONTRAST:  35mL ISOVUE-300 IOPAMIDOL (ISOVUE-300) INJECTION 61% COMPARISON:  Abdominal CT dated 02/20/2013 FINDINGS: Lower chest: Mild emphysematous changes of the visualized lung bases. Coronary vascular calcification noted. There is no intra-abdominal free air or free fluid. Hepatobiliary: The liver is unremarkable. No intrahepatic biliary ductal dilatation. High attenuating content within the gallbladder likely represents sludge/stones. Gallbladder polyp or mass is less likely but not excluded. Ultrasound may provide better evaluation. No associated active inflammation. Pancreas: Unremarkable. No pancreatic ductal dilatation or surrounding inflammatory changes. Spleen: Normal in size without focal abnormality. Adrenals/Urinary Tract: The adrenal glands are unremarkable. The kidneys, visualized ureters are unremarkable. The urinary bladder is not well evaluated due to streak artifact caused by right hip arthroplasty. Stomach/Bowel: Oral contrast opacifies the stomach and multiple loops of small bowel and traverses into the colon. There is no bowel obstruction or active inflammation. There is sigmoid diverticulosis 50 without definite active inflammatory changes. Evaluation of the sigmoid is however limited due to streak artifact caused by right hip arthroplasty. Appendectomy. Vascular/Lymphatic: There is advanced aortoiliac atherosclerotic disease. Infrarenal abdominal aortic aneurysm status post prior endovascular aorto bi-iliac repair. The infrarenal abdominal aorta measures up to 3.2 cm in maximal diameter similar to prior CT. 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 The aortic portion and the left iliac limb of the graft appeared patent. There is a patent femoral-femoral bypass graft. No  periaortic inflammatory changes or extravasation of contrast. Overall the appearance of the aorta and stent graft appear similar to prior CT. Reproductive: Hysterectomy. Bilateral adnexal low attenuating/cystic lesions measure up to 5.4 cm, relatively stable or slightly increased compared to the study of 2014 and likely represent a benign or indolent process. Ultrasound may provide better evaluation if clinically indicated. Other: None Musculoskeletal: There is a right hip hemiarthroplasty. There is degenerative changes of the spine primarily at L4-L5. No acute osseous pathology. IMPRESSION: 1. Colonic diverticulosis. No bowel obstruction or active inflammation. 2. High attenuating content within the gallbladder likely represents sludge or small stones. Gallbladder polyp or mass are  not excluded. Ultrasound may provide better evaluation. 3. Stable appearance of the abdominal aortic aneurysm and endovascular stent graft repair with chronic occlusion of the right iliac limb of the graft. No evidence of leak. 4. Grossly stable or minimally increased in axial cystic lesions, likely benign or indolent process ultrasound may provide better evaluation. Electronically Signed   By: Anner Crete M.D.   On: 09/06/2017 21:59     CBC Recent Labs  Lab 09/06/17 1914 09/06/17 2331 09/07/17 0539 09/07/17 1054  WBC 12.2*  --  7.4  --   HGB 12.8 10.7* 9.6* 9.8*  HCT 37.4  --  29.3*  --   PLT 460*  --  347  --   MCV 92.0  --  91.9  --   MCH 31.6  --  30.0  --   MCHC 34.3  --  32.7  --   RDW 14.8*  --  14.5  --     Chemistries  Recent Labs  Lab 09/06/17 1914 09/07/17 0539  NA 137 136  K 4.2 4.0  CL 100* 101  CO2 27 28  GLUCOSE 160* 97  BUN 13 10  CREATININE 0.84 0.68  CALCIUM 8.9 7.8*  AST 21  --   ALT 10*  --   ALKPHOS 65  --   BILITOT 0.4  --    ------------------------------------------------------------------------------------------------------------------ estimated creatinine clearance is  38.6 mL/min (by C-G formula based on SCr of 0.68 mg/dL). ------------------------------------------------------------------------------------------------------------------ No results for input(s): HGBA1C in the last 72 hours. ------------------------------------------------------------------------------------------------------------------ No results for input(s): CHOL, HDL, LDLCALC, TRIG, CHOLHDL, LDLDIRECT in the last 72 hours. ------------------------------------------------------------------------------------------------------------------ No results for input(s): TSH, T4TOTAL, T3FREE, THYROIDAB in the last 72 hours.  Invalid input(s): FREET3 ------------------------------------------------------------------------------------------------------------------ No results for input(s): VITAMINB12, FOLATE, FERRITIN, TIBC, IRON, RETICCTPCT in the last 72 hours.  Coagulation profile Recent Labs  Lab 09/06/17 1914  INR 0.97    No results for input(s): DDIMER in the last 72 hours.  Cardiac Enzymes No results for input(s): CKMB, TROPONINI, MYOGLOBIN in the last 168 hours.  Invalid input(s): CK ------------------------------------------------------------------------------------------------------------------ Invalid input(s): POCBNP    Assessment & Plan   84 year old female with past medical history of peripheral asked disease, previous CVA, previous history of DVT, hypertension, hyperlipidemia, previous history of peptic ulcer disease who presents to the hospital due to multiple episodes of rectal bleeding.  1. GI bleed likely lower GI bleed -Currently hemoglobin is stable.  Continue to hold Plavix (reason for Plavix usage is not very clear per patient's history possibly related to stroke) -To full liquid diet -No further bleeding -If no further bleeding we will discharge tomorrow can follow follow-up outpatient for any endoscopic evaluation if needed  2. Essential hypertension-w  pressure stable  3. Hyperlipidemia-continue atorvastatin.  4. GERD-continue Protonix.  5. Osteoarthritis/tonic pain-continue Robaxin, Vicodin as needed.       Code Status Orders  (From admission, onward)        Start     Ordered   09/06/17 2256  Full code  Continuous     09/06/17 2256    Code Status History    Date Active Date Inactive Code Status Order ID Comments User Context   06/16/2017 08:13 06/21/2017 12:32 Full Code [id number]  Harrie Foreman, MD Inpatient   08/19/2016 20:14 08/21/2016 18:34 Full Code [id number]  Nicholes Mango, MD Inpatient    Advance Directive Documentation     Most Recent Value  Type of Advance Directive  Living will  Pre-existing out of facility DNR  order (yellow form or pink MOST form)  No data  "MOST" Form in Place?  No data           Consults gastroenterology  DVT Prophylaxis SCD Lab Results  Component Value Date   PLT 347 09/07/2017     Time Spent in minutes   25min Greater than 50% of time spent in care coordination and counseling patient regarding the condition and plan of care.   Dustin Flock M.D on 09/07/2017 at 3:43 PM  Between 7am to 6pm - Pager - [phone]  After 6pm go to [email or web address] - password [email or web address] [facility]   Office  [phone]

## 2017-09-07 NOTE — Care Management Obs Status (Signed)
[place] NOTIFICATION   Patient Details  Name: Tasha Reyes MRN: [id number] Date of Birth: December 29, 1930   Medicare Observation Status Notification Given:  Yes    Jazman Reuter A, RN 09/07/2017, 4:04 PM

## 2017-09-07 NOTE — Consult Note (Signed)
Tasha Antigua, MD [address], [place], [place], [place], [address] [address], [place], [place], [place], [address] Phone: [phone]  Fax: [phone]  Consultation  Referring Provider:     Dr. Posey Pronto Primary Care Physician:  Glean Hess, MD Primary Gastroenterologist:  Virgel Manifold, MD        Reason for Consultation:     Rectal Bleeding  Date of Admission:  09/06/2017 Date of Consultation:  09/07/2017         HPI:   Tasha Reyes is a 84 y.o. female presents with multiple episodes of hematochezia at home for 2 days. Total of 5-6 episodes. Reports mild midepigastric cramping 3/10 abdominal pain chronically, but worse over the last 3 days. no nausea vomiting, no dysphagia, no heartburn.  Patient is on Plavix at home for history of DVT. Denies NSAIDs  Patient has previously been seen by Dr. Allen Norris and Dr. Vira Agar, last in 2014.  She had an EGD in August 2014 By Dr. Vira Agar, that showed a hiatal hernia, 5 nonbleeding gastric ulcers, largest 3 mm in size, 2 stomach nonbleeding angiectasias, and 1 duodenal nonbleeding angiectasia that were treated with APC.  Dr. Vira Agar repeated the EGD in October 2014 and no ulcers were reported during that exam.  A small bowel nonbleeding AVM was reported along with the previously known hiatal hernia was also reported.  Patient had a colonoscopy in 2012 for polyp surveillance, and 2 polyps were removed by Dr. Vira Agar.  One was 5 mm in the ascending colon, another one was 10 mm in the sigmoid colon.  Diverticulosis was reported as well as internal hemorrhoids.  Past Medical History:  Diagnosis Date  . Aneurysm of iliac artery (HCC) 05/11/2015  . COPD (chronic obstructive pulmonary disease) ([place])   . Cystitis   . History of DVT (deep vein thrombosis) 05/11/2015  . History of peptic ulcer disease 05/11/2015  . [name]   . Hypertension   . Losing weight   . Migraines   . [name]   . Stroke ([place])    . Vascular disease     Past Surgical History:  Procedure Laterality Date  . ABDOMINAL AORTIC ANEURYSM REPAIR W/ ENDOLUMINAL GRAFT  2008  . ABDOMINAL HYSTERECTOMY    . APPENDECTOMY    . BACK SURGERY    . blood clot      removal right leg  . COLONOSCOPY  2012  . ESOPHAGOGASTRODUODENOSCOPY  04/2013   gastric ulcers  . FEMORAL-POPLITEAL BYPASS GRAFT Right 2008  . HIP ARTHROPLASTY Right 06/17/2017   Procedure: ARTHROPLASTY BIPOLAR HIP (HEMIARTHROPLASTY);  Surgeon: Earnestine Leys, MD;  Location: [facility] ORS;  Service: Orthopedics;  Laterality: Right;  . JOINT REPLACEMENT Right 06/19/2017   right hip FX.  Marland Kitchen PARTIAL HYSTERECTOMY      Prior to Admission medications   Medication Sig Start Date End Date Taking? Authorizing Provider  atenolol (TENORMIN) 25 MG tablet TAKE 1 TABLET(25 MG) BY MOUTH TWICE DAILY 06/29/17  Yes Glean Hess, MD  atorvastatin (LIPITOR) 10 MG tablet TAKE 1 TABLET BY MOUTH EVERY NIGHT AT BEDTIME 06/05/17  Yes Glean Hess, MD  Cholecalciferol (VITAMIN D3) 2000 units capsule Take 1 capsule (2,000 Units total) by mouth daily. 07/23/17  Yes Vevelyn Francois, NP  clopidogrel (PLAVIX) 75 MG tablet TAKE 1 TABLET BY MOUTH DAILY 02/26/17  Yes Dew, Erskine Squibb, MD  ferrous sulfate 325 (65 FE) MG tablet TAKE 1 TABLET(325 MG) BY MOUTH DAILY 01/01/17  Yes Army Melia,  Jesse Sans, MD  mirtazapine (REMERON) 15 MG tablet Take 1 tablet (15 mg total) by mouth at bedtime. 08/01/17  Yes Glean Hess, MD  pantoprazole (PROTONIX) 40 MG tablet TAKE 1 TABLET(40 MG) BY MOUTH TWICE DAILY 11/06/16  Yes Glean Hess, MD  predniSONE (DELTASONE) 5 MG tablet Take 1 tablet by mouth daily. 08/23/17  Yes [provider]  HYDROcodone-acetaminophen (NORCO/VICODIN) 5-325 MG tablet Take 1 tablet by mouth every 8 (eight) hours as needed. 08/25/17   [provider]  methocarbamol (ROBAXIN) 750 MG tablet Take 1 tablet (750 mg total) by mouth 3 (three) times daily as needed for muscle spasms.  08/16/17 11/14/17  Vevelyn Francois, NP  ondansetron (ZOFRAN) 4 MG tablet Take 1 tablet (4 mg total) by mouth every 6 (six) hours as needed for nausea. 06/20/17   Dustin Flock, MD  polyethylene glycol powder (GLYCOLAX/MIRALAX) powder MIX AND TAKE 17 GRAMS BY MOUTH prn 01/30/16   [provider]    Family History  Problem Relation Age of Onset  . Brain cancer Father   . Kidney disease Mother   . Colon cancer Brother      Social History   Tobacco Use  . Smoking status: Current Every Day Smoker    Packs/day: 0.25    Years: 61.00    Pack years: 15.25    Types: Cigarettes  . Smokeless tobacco: Never Used  . Tobacco comment: 3 cigrettes a day- cut back  Substance Use Topics  . Alcohol use: No    Alcohol/week: 0.0 oz  . Drug use: No    Allergies as of 09/06/2017  . (No Known Allergies)    Review of Systems:    All systems reviewed and negative except where noted in HPI.   Physical Exam:  Vital signs in last 24 hours: Vitals:   09/06/17 2200 09/06/17 2223 09/06/17 2306 09/07/17 0348  BP: (!) 124/56 (!) 102/59 121/60 (!) 126/59  Pulse: 68 68 66 60  Resp:  18 16 18   Temp:   98.5 F (36.9 C) 97.8 F (36.6 C)  TempSrc:   Oral Oral  SpO2: 100% 97% 97% 99%  Weight:      Height:       Last BM Date: 09/06/17 General:   Pleasant, cooperative in NAD Head:  Normocephalic and atraumatic. Eyes:   No icterus.   Conjunctiva pink. PERRLA. Ears:  Normal auditory acuity. Neck:  Supple; no masses or thyroidomegaly Lungs: Respirations even and unlabored. Lungs clear to auscultation bilaterally.   No wheezes, crackles, or rhonchi.  Heart:  Regular rate and rhythm;  Without murmur, clicks, rubs or gallops Abdomen:  Soft, nondistended, nontender. Normal bowel sounds. No appreciable masses or hepatomegaly.  No rebound or guarding.  Neurologic:  Alert and oriented x3;  grossly normal neurologically. Skin:  Intact without significant lesions or rashes. Cervical Nodes:  No significant  cervical adenopathy. Psych:  Alert and cooperative. Normal affect.  LAB RESULTS: Recent Labs    09/06/17 1914 09/06/17 2331 09/07/17 0539  WBC 12.2*  --  7.4  HGB 12.8 10.7* 9.6*  HCT 37.4  --  29.3*  PLT 460*  --  347   BMET Recent Labs    09/06/17 1914 09/07/17 0539  NA 137 136  K 4.2 4.0  CL 100* 101  CO2 27 28  GLUCOSE 160* 97  BUN 13 10  CREATININE 0.84 0.68  CALCIUM 8.9 7.8*   LFT Recent Labs    09/06/17 1914  PROT  7.6  ALBUMIN 4.1  AST 21  ALT 10*  ALKPHOS 65  BILITOT 0.4   PT/INR Recent Labs    09/06/17 1914  LABPROT 12.8  INR 0.97    STUDIES: Ct Abdomen Pelvis W Contrast  Result Date: 09/06/2017 CLINICAL DATA:  84 year old female with rectal bleeding. EXAM: CT ABDOMEN AND PELVIS WITH CONTRAST TECHNIQUE: Multidetector CT imaging of the abdomen and pelvis was performed using the standard protocol following bolus administration of intravenous contrast. CONTRAST:  87mL ISOVUE-300 IOPAMIDOL (ISOVUE-300) INJECTION 61% COMPARISON:  Abdominal CT dated 02/20/2013 FINDINGS: Lower chest: Mild emphysematous changes of the visualized lung bases. Coronary vascular calcification noted. There is no intra-abdominal free air or free fluid. Hepatobiliary: The liver is unremarkable. No intrahepatic biliary ductal dilatation. High attenuating content within the gallbladder likely represents sludge/stones. Gallbladder polyp or mass is less likely but not excluded. Ultrasound may provide better evaluation. No associated active inflammation. Pancreas: Unremarkable. No pancreatic ductal dilatation or surrounding inflammatory changes. Spleen: Normal in size without focal abnormality. Adrenals/Urinary Tract: The adrenal glands are unremarkable. The kidneys, visualized ureters are unremarkable. The urinary bladder is not well evaluated due to streak artifact caused by right hip arthroplasty. Stomach/Bowel: Oral contrast opacifies the stomach and multiple loops of small bowel and  traverses into the colon. There is no bowel obstruction or active inflammation. There is sigmoid diverticulosis 50 without definite active inflammatory changes. Evaluation of the sigmoid is however limited due to streak artifact caused by right hip arthroplasty. Appendectomy. Vascular/Lymphatic: There is advanced aortoiliac atherosclerotic disease. Infrarenal abdominal aortic aneurysm status post prior endovascular aorto bi-iliac repair. The infrarenal abdominal aorta measures up to 3.2 cm in maximal diameter similar to prior CT. 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 The aortic portion and the left iliac limb of the graft appeared patent. There is a patent femoral-femoral bypass graft. No periaortic inflammatory changes or extravasation of contrast. Overall the appearance of the aorta and stent graft appear similar to prior CT. Reproductive: Hysterectomy. Bilateral adnexal low attenuating/cystic lesions measure up to 5.4 cm, relatively stable or slightly increased compared to the study of 2014 and likely represent a benign or indolent process. Ultrasound may provide better evaluation if clinically indicated. Other: None Musculoskeletal: There is a right hip hemiarthroplasty. There is degenerative changes of the spine primarily at L4-L5. No acute osseous pathology. IMPRESSION: 1. Colonic diverticulosis. No bowel obstruction or active inflammation. 2. High attenuating content within the gallbladder likely represents sludge or small stones. Gallbladder polyp or mass are not excluded. Ultrasound may provide better evaluation. 3. Stable appearance of the abdominal aortic aneurysm and endovascular stent graft repair with chronic occlusion of the right iliac limb of the graft. No evidence of leak. 4. Grossly stable or minimally increased in axial cystic lesions, likely benign or indolent process ultrasound may provide better evaluation. Electronically Signed   By: Anner Crete M.D.   On:  09/06/2017 21:59      Impression / Plan:   ARIEONA SWAGGERTY is a 84 y.o. y/o female with presents with hematochezia, with previous history of gastric ulcers and angioectasias noted in 2014 EGD, and history of diverticulosis, with last colonoscopy in 2012 that was done for polyp surveillance and polyps removed  Hematochezia resolved at this time, last bowel movement was at 7 PM yesterday.  Patient's hematochezia is likely related to diverticulosis versus internal hemorrhoids Upper GI bleed is also a possibility given her hx of gastric ulcers Start Protonix 40 mg IV  twice daily Avoid NSAIDs Continue serial CBCs and transfuse PRN  Would recommend primary team to carefully evaluate patient's history and determine if Plavix needs to be continued long-term.  She states it was started for a DVT, she does not recall when the DVT was, but states it was at least a year ago. If Plavix does not need to be continued long-term in this elderly female, it would help determine her management.  As per guidelines, Plavix needs to be held for 3 - 5 days prior to any endoscopic procedures, as it entails high risk of bleeding with therapeutic intervention on Plavix Given her age and comorbidities, endoscopy would be a high risk procedure for this patient This was discussed with the patient, and the alternative of conservative management with no endoscopies if no further bleeding was discussed in detail Will await primary team decision to continue Plavix or not, will follow clinical status, await for Plavix to be held for 3-5 days, and plan for procedures accordingly.  If Patient has signs of active GI bleeding, prior to that 3-5 days, when an EGD can be done safely, would recommend RBC scan or CTA to evaluate etiology of bleeding   Thank you for involving me in the care of this patient.      LOS: 0 days   Virgel Manifold, MD  09/07/2017, 7:50 AM

## 2017-09-08 ENCOUNTER — Telehealth: Payer: Self-pay

## 2017-09-08 DIAGNOSIS — Z8673 Personal history of transient ischemic attack (TIA), and cerebral infarction without residual deficits: Secondary | ICD-10-CM | POA: Diagnosis not present

## 2017-09-08 DIAGNOSIS — I10 Essential (primary) hypertension: Secondary | ICD-10-CM | POA: Diagnosis not present

## 2017-09-08 DIAGNOSIS — K625 Hemorrhage of anus and rectum: Secondary | ICD-10-CM | POA: Diagnosis not present

## 2017-09-08 DIAGNOSIS — E785 Hyperlipidemia, unspecified: Secondary | ICD-10-CM | POA: Diagnosis not present

## 2017-09-08 DIAGNOSIS — K5730 Diverticulosis of large intestine without perforation or abscess without bleeding: Secondary | ICD-10-CM | POA: Diagnosis not present

## 2017-09-08 MED ORDER — CLOPIDOGREL BISULFATE 75 MG PO TABS: 75.0000 mg | ORAL_TABLET | Freq: Every day | ORAL | 5 refills | Status: DC

## 2017-09-08 NOTE — Discharge Instructions (Signed)
[place] [facility] [place]:  Low fat, Low cholesterol diet  DISCHARGE CONDITION:  Stable  ACTIVITY:  Activity as tolerated  OXYGEN:  Home Oxygen: No.   Oxygen Delivery: room air  DISCHARGE LOCATION:  home    ADDITIONAL DISCHARGE INSTRUCTION:   If you experience worsening of your admission symptoms, develop shortness of breath, life threatening emergency, suicidal or homicidal thoughts you must seek medical attention immediately by calling 911 or calling your MD immediately  if symptoms less severe.  You Must read complete instructions/literature along with all the possible adverse reactions/side effects for all the Medicines you take and that have been prescribed to you. Take any new Medicines after you have completely understood and accpet all the possible adverse reactions/side effects.   Please note  You were cared for by a hospitalist during your hospital stay. If you have any questions about your discharge medications or the care you received while you were in the hospital after you are discharged, you can call the unit and asked to speak with the hospitalist on call if the hospitalist that took care of you is not available. Once you are discharged, your primary care physician will handle any further medical issues. Please note that NO REFILLS for any discharge medications will be authorized once you are discharged, as it is imperative that you return to your primary care physician (or establish a relationship with a primary care physician if you do not have one) for your aftercare needs so that they can reassess your need for medications and monitor your lab values.

## 2017-09-08 NOTE — Discharge Summary (Signed)
Tasha Reyes at [facility], 84 y.o., DOB October 01, 1930, MRN [id number]. Admission date: 09/06/2017 Discharge Date 09/08/2017 Primary MD Tasha Hess, MD Admitting Physician Henreitta Leber, MD  Admission Diagnosis  Rectal bleeding [K62.5] Lower GI bleed [K92.2]  Discharge Diagnosis   Active Problems:    Rectal bleeding due to diverticular bleeding Essential hypertension Hyperlipidemia GERD Osteoarthritis  Hospital Course  Patient is a 84 year old female with past medical history of peripheral arterial disease, previous CVA and a history of DVT and hyperlipidemia as well as diverticulosis presented with bright red blood per rectum.  Patient had a CT scan which showed evidence of diverticulosis.  No other source of bleeding was noted.  Her Plavix was held.  Hemoglobin remained stable.  Patient's diet was advanced she is tolerated that well.  And has not had any further bleeding she will need outpatient GI follow-up.  She is stable for discharge.           Consults  GI  Significant Tests:  See full reports for all details     Ct Abdomen Pelvis W Contrast  Result Date: 09/06/2017 CLINICAL DATA:  84 year old female with rectal bleeding. EXAM: CT ABDOMEN AND PELVIS WITH CONTRAST TECHNIQUE: Multidetector CT imaging of the abdomen and pelvis was performed using the standard protocol following bolus administration of intravenous contrast. CONTRAST:  43mL ISOVUE-300 IOPAMIDOL (ISOVUE-300) INJECTION 61% COMPARISON:  Abdominal CT dated 02/20/2013 FINDINGS: Lower chest: Mild emphysematous changes of the visualized lung bases. Coronary vascular calcification noted. There is no intra-abdominal free air or free fluid. Hepatobiliary: The liver is unremarkable. No intrahepatic biliary ductal dilatation. High attenuating content within the gallbladder likely represents sludge/stones. Gallbladder polyp or mass is less likely but not excluded. Ultrasound may  provide better evaluation. No associated active inflammation. Pancreas: Unremarkable. No pancreatic ductal dilatation or surrounding inflammatory changes. Spleen: Normal in size without focal abnormality. Adrenals/Urinary Tract: The adrenal glands are unremarkable. The kidneys, visualized ureters are unremarkable. The urinary bladder is not well evaluated due to streak artifact caused by right hip arthroplasty. Stomach/Bowel: Oral contrast opacifies the stomach and multiple loops of small bowel and traverses into the colon. There is no bowel obstruction or active inflammation. There is sigmoid diverticulosis 50 without definite active inflammatory changes. Evaluation of the sigmoid is however limited due to streak artifact caused by right hip arthroplasty. Appendectomy. Vascular/Lymphatic: There is advanced aortoiliac atherosclerotic disease. Infrarenal abdominal aortic aneurysm status post prior endovascular aorto bi-iliac repair. The infrarenal abdominal aorta measures up to 3.2 cm in maximal diameter similar to prior CT. 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 The aortic portion and the left iliac limb of the graft appeared patent. There is a patent femoral-femoral bypass graft. No periaortic inflammatory changes or extravasation of contrast. Overall the appearance of the aorta and stent graft appear similar to prior CT. Reproductive: Hysterectomy. Bilateral adnexal low attenuating/cystic lesions measure up to 5.4 cm, relatively stable or slightly increased compared to the study of 2014 and likely represent a benign or indolent process. Ultrasound may provide better evaluation if clinically indicated. Other: None Musculoskeletal: There is a right hip hemiarthroplasty. There is degenerative changes of the spine primarily at L4-L5. No acute osseous pathology. IMPRESSION: 1. Colonic diverticulosis. No bowel obstruction or active inflammation. 2. High attenuating content within the  gallbladder likely represents sludge or small stones. Gallbladder polyp or mass are not excluded. Ultrasound may provide better evaluation. 3. 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 No evidence of leak. 4. Grossly stable or minimally increased in axial cystic lesions, likely benign or indolent process ultrasound may provide better evaluation. Electronically Signed   By: Anner Crete M.D.   On: 09/06/2017 21:59       Today   Subjective:   Tasha Reyes patient feeling better no further bleeding  Objective:   Blood pressure 111/64, pulse 80, temperature 98.6 F (37 C), temperature source Oral, resp. rate 16, height 5\' 6"  (1.676 m), weight 105 lb (47.6 kg), SpO2 92 %.  .  Intake/Output Summary (Last 24 hours) at 09/08/2017 1330 Last data filed at 09/08/2017 0900 Gross per 24 hour  Intake 720 ml  Output -  Net 720 ml    Exam VITAL SIGNS: Blood pressure 111/64, pulse 80, temperature 98.6 F (37 C), temperature source Oral, resp. rate 16, height 5\' 6"  (1.676 m), weight 105 lb (47.6 kg), SpO2 92 %.  GENERAL:  84 y.o.-year-old patient lying in the bed with no acute distress.  EYES: Pupils equal, round, reactive to light and accommodation. No scleral icterus. Extraocular muscles intact.  HEENT: Head atraumatic, normocephalic. Oropharynx and nasopharynx clear.  NECK:  Supple, no jugular venous distention. No thyroid enlargement, no tenderness.  LUNGS: Normal breath sounds bilaterally, no wheezing, rales,rhonchi or crepitation. No use of accessory muscles of respiration.  CARDIOVASCULAR: S1, S2 normal. No murmurs, rubs, or gallops.  ABDOMEN: Soft, nontender, nondistended. Bowel sounds present. No organomegaly or mass.  EXTREMITIES: No pedal edema, cyanosis, or clubbing.  NEUROLOGIC: Cranial nerves II through XII are intact. Muscle strength 5/5 in all extremities. Sensation  intact. Gait not checked.  PSYCHIATRIC: The patient is alert and oriented x 3.  SKIN: No obvious rash, lesion, or ulcer.   Data Review     CBC w Diff:  Lab Results  Component Value Date   WBC 7.4 09/07/2017   HGB 9.6 (L) 09/07/2017   HGB 15.4 03/19/2017   HCT 29.3 (L) 09/07/2017   HCT 46.5 03/19/2017   PLT 347 09/07/2017   PLT 411 (H) 03/19/2017   LYMPHOPCT 11 06/16/2017   LYMPHOPCT 15.1 02/28/2013   MONOPCT 5 06/16/2017   MONOPCT 9.9 02/28/2013   EOSPCT 1 06/16/2017   EOSPCT 1.3 02/28/2013   BASOPCT 1 06/16/2017   BASOPCT 0.6 02/28/2013   CMP:  Lab Results  Component Value Date   NA 136 09/07/2017   NA 138 04/30/2017   NA 137 02/27/2013   K 4.0 09/07/2017   K 3.6 02/27/2013   CL 101 09/07/2017   CL 105 02/27/2013   CO2 28 09/07/2017   CO2 26 02/27/2013   BUN 10 09/07/2017   BUN 9 04/30/2017   BUN 4 (L) 02/27/2013   CREATININE 0.68 09/07/2017   CREATININE 0.72 02/27/2013   PROT 7.6 09/06/2017   PROT 7.7 03/19/2017   PROT 5.6 (L) 02/21/2013   ALBUMIN 4.1 09/06/2017   ALBUMIN 4.5 03/19/2017   ALBUMIN 2.7 (L) 02/21/2013   BILITOT 0.4 09/06/2017   BILITOT 0.4 03/19/2017   BILITOT 0.3 02/21/2013   ALKPHOS 65 09/06/2017   ALKPHOS 85 02/21/2013   AST 21 09/06/2017   AST 17 02/21/2013   ALT 10 (L) 09/06/2017   ALT 15 02/21/2013  .  Micro Results No results found for this or any previous visit (from the past 240 hour(s)).      Code Status Orders  (  From admission, onward)        Start     Ordered   09/06/17 2256  Full code  Continuous     09/06/17 2256    Code Status History    Date Active Date Inactive Code Status Order ID Comments User Context   06/16/2017 08:13 06/21/2017 12:32 Full Code [id number]  Harrie Foreman, MD Inpatient   08/19/2016 20:14 08/21/2016 18:34 Full Code [id number]  Nicholes Mango, MD Inpatient    Advance Directive Documentation     Most Recent Value  Type of Advance Directive  Living will  Pre-existing out of facility DNR  order (yellow form or pink MOST form)  No data  "MOST" Form in Place?  No data          Follow-up Information    Tasha Hess, MD Follow up in 1 week(s).   Specialty:  Internal Medicine Why:  hosp f/u Contact information: [address] [place] [address] [phone]        Manya Silvas, MD Follow up in 2 week(s).   Specialty:  Gastroenterology Contact information: [place] [address] [phone]           Discharge Medications   Allergies as of 09/08/2017   No Known Allergies     Medication List    TAKE these medications   atenolol 25 MG tablet Commonly known as:  TENORMIN TAKE 1 TABLET(25 MG) BY MOUTH TWICE DAILY   atorvastatin 10 MG tablet Commonly known as:  LIPITOR TAKE 1 TABLET BY MOUTH EVERY NIGHT AT BEDTIME   clopidogrel 75 MG tablet Commonly known as:  PLAVIX Take 1 tablet (75 mg total) by mouth daily. Please resume on Friday 09/12/2017 Start taking on:  09/12/2017 What changed:    how much to take  additional instructions  These instructions start on 09/12/2017. If you are unsure what to do until then, ask your doctor or other care provider.   ferrous sulfate 325 (65 FE) MG tablet TAKE 1 TABLET(325 MG) BY MOUTH DAILY   HYDROcodone-acetaminophen 5-325 MG tablet Commonly known as:  NORCO/VICODIN Take 1 tablet by mouth every 8 (eight) hours as needed.   methocarbamol 750 MG tablet Commonly known as:  ROBAXIN Take 1 tablet (750 mg total) by mouth 3 (three) times daily as needed for muscle spasms.   mirtazapine 15 MG tablet Commonly known as:  REMERON Take 1 tablet (15 mg total) by mouth at bedtime.   ondansetron 4 MG tablet Commonly known as:  ZOFRAN Take 1 tablet (4 mg total) by mouth every 6 (six) hours as needed for nausea.   pantoprazole 40 MG tablet Commonly known as:  PROTONIX TAKE 1 TABLET(40 MG) BY MOUTH TWICE DAILY   polyethylene glycol powder powder Commonly known as:   GLYCOLAX/MIRALAX MIX AND TAKE 17 GRAMS BY MOUTH prn   predniSONE 5 MG tablet Commonly known as:  DELTASONE Take 1 tablet by mouth daily.   Vitamin D3 2000 units capsule Take 1 capsule (2,000 Units total) by mouth daily.          Total Time in preparing paper work, data evaluation and todays exam - 50 minutes  Dustin Flock M.D on 09/08/2017 at 1:30 PM  [facility]   Office  [phone]

## 2017-09-08 NOTE — Telephone Encounter (Signed)
Discharge summary reviewed and discussed with Dr. Army Melia. Because pt is to follow up w/ Dr. Vira Agar within next 2 weeks, Dr. Army Melia states it is unnecessary to see this pt for a hosp f/u appt. Care for rectal bleeding will be assumed by Dr. Vira Agar. In light of our conversation, a TCM call was not placed NOR was pt scheduled for a hosp f/u appt.

## 2017-09-08 NOTE — Progress Notes (Signed)
Pt in no acute distress. Denies any more bloody stools. RN explained discharge instructions to pt and son. VSS. IV removed. Pt wheeled to pt sons vehicle at visitors entrance and assisted into vehicle.

## 2017-09-09 ENCOUNTER — Other Ambulatory Visit (INDEPENDENT_AMBULATORY_CARE_PROVIDER_SITE_OTHER): Payer: Self-pay | Admitting: Vascular Surgery

## 2017-10-16 ENCOUNTER — Other Ambulatory Visit: Payer: Self-pay | Admitting: Internal Medicine

## 2017-10-21 ENCOUNTER — Encounter: Payer: Self-pay | Admitting: Nurse Practitioner

## 2017-10-21 ENCOUNTER — Ambulatory Visit: Payer: Medicare Other | Attending: Nurse Practitioner | Admitting: Nurse Practitioner

## 2017-10-21 ENCOUNTER — Other Ambulatory Visit: Payer: Self-pay

## 2017-10-21 VITALS — BP 105/57 | HR 70 | Temp 98.4°F | Resp 16 | Ht 66.0 in | Wt 105.0 lb

## 2017-10-21 DIAGNOSIS — E7800 Pure hypercholesterolemia, unspecified: Secondary | ICD-10-CM | POA: Insufficient documentation

## 2017-10-21 DIAGNOSIS — M9983 Other biomechanical lesions of lumbar region: Secondary | ICD-10-CM | POA: Diagnosis not present

## 2017-10-21 DIAGNOSIS — I10 Essential (primary) hypertension: Secondary | ICD-10-CM | POA: Diagnosis not present

## 2017-10-21 DIAGNOSIS — M810 Age-related osteoporosis without current pathological fracture: Secondary | ICD-10-CM | POA: Diagnosis not present

## 2017-10-21 DIAGNOSIS — F17210 Nicotine dependence, cigarettes, uncomplicated: Secondary | ICD-10-CM | POA: Insufficient documentation

## 2017-10-21 DIAGNOSIS — M7918 Myalgia, other site: Secondary | ICD-10-CM | POA: Insufficient documentation

## 2017-10-21 DIAGNOSIS — E559 Vitamin D deficiency, unspecified: Secondary | ICD-10-CM | POA: Insufficient documentation

## 2017-10-21 DIAGNOSIS — M47816 Spondylosis without myelopathy or radiculopathy, lumbar region: Secondary | ICD-10-CM

## 2017-10-21 DIAGNOSIS — Z79899 Other long term (current) drug therapy: Secondary | ICD-10-CM | POA: Insufficient documentation

## 2017-10-21 DIAGNOSIS — G8929 Other chronic pain: Secondary | ICD-10-CM | POA: Diagnosis not present

## 2017-10-21 DIAGNOSIS — M25551 Pain in right hip: Secondary | ICD-10-CM | POA: Diagnosis not present

## 2017-10-21 DIAGNOSIS — M48061 Spinal stenosis, lumbar region without neurogenic claudication: Secondary | ICD-10-CM | POA: Diagnosis not present

## 2017-10-21 DIAGNOSIS — G2581 Restless legs syndrome: Secondary | ICD-10-CM | POA: Insufficient documentation

## 2017-10-21 DIAGNOSIS — Z96641 Presence of right artificial hip joint: Secondary | ICD-10-CM | POA: Diagnosis not present

## 2017-10-21 DIAGNOSIS — J449 Chronic obstructive pulmonary disease, unspecified: Secondary | ICD-10-CM | POA: Insufficient documentation

## 2017-10-21 DIAGNOSIS — Z8673 Personal history of transient ischemic attack (TIA), and cerebral infarction without residual deficits: Secondary | ICD-10-CM | POA: Diagnosis not present

## 2017-10-21 DIAGNOSIS — G894 Chronic pain syndrome: Secondary | ICD-10-CM | POA: Diagnosis not present

## 2017-10-21 DIAGNOSIS — M5417 Radiculopathy, lumbosacral region: Secondary | ICD-10-CM | POA: Diagnosis not present

## 2017-10-21 DIAGNOSIS — Z86718 Personal history of other venous thrombosis and embolism: Secondary | ICD-10-CM | POA: Insufficient documentation

## 2017-10-21 DIAGNOSIS — Z5181 Encounter for therapeutic drug level monitoring: Secondary | ICD-10-CM | POA: Diagnosis not present

## 2017-10-21 DIAGNOSIS — Z79891 Long term (current) use of opiate analgesic: Secondary | ICD-10-CM | POA: Diagnosis not present

## 2017-10-21 DIAGNOSIS — F329 Major depressive disorder, single episode, unspecified: Secondary | ICD-10-CM | POA: Diagnosis not present

## 2017-10-21 MED ORDER — HYDROCODONE-ACETAMINOPHEN 5-325 MG PO TABS: 1.0000 | ORAL_TABLET | Freq: Three times a day (TID) | ORAL | 0 refills | Status: DC | PRN

## 2017-10-21 MED ORDER — VITAMIN D3 50 MCG (2000 UT) PO CAPS: ORAL_CAPSULE | ORAL | 12 refills | Status: DC

## 2017-10-21 MED ORDER — METHOCARBAMOL 750 MG PO TABS: 750.0000 mg | ORAL_TABLET | Freq: Three times a day (TID) | ORAL | 2 refills | Status: DC | PRN

## 2017-10-21 NOTE — Progress Notes (Signed)
Patient's Name: Tasha Reyes  MRN: [id number]  Referring Provider: Glean Hess, MD  DOB: 02-Aug-1930  PCP: Glean Hess, MD  DOS: 10/21/2017  Note by: Vevelyn Francois NP  Service setting: Ambulatory outpatient  Specialty: Interventional Pain Management  Location: [facility] (AMB) Pain Management Facility    Patient type: Established    Primary Reason(s) for Visit: Encounter for prescription drug management. (Level of risk: moderate)  CC: Back Pain (lower)  HPI  Tasha Reyes is a 84 y.o. year old, female patient, who comes today for a medication management evaluation. She has Dyslipidemia; Compulsive tobacco user syndrome; Chronic peptic ulcer with bleeding; Essential (primary) hypertension; Depression, major, recurrent, in partial remission ([place]); COPD (chronic obstructive pulmonary disease) ([place]); Restless leg; Chronic tension-type headache, intractable; Lumbosacral Radiculopathy (Right); Peripheral vascular disease ([place]); Hx of long term use of blood thinners (Plavix); Lower extremity pain (Right); Neurogenic pain; History of DVT (deep vein thrombosis); Facet syndrome, lumbar ([place]); Lumbar foraminal stenosis (Right) (Severe at L4-5); Opioid-induced constipation ([place]); Osteoporosis, post-menopausal; Vitamin D deficiency; Protein-calorie malnutrition, severe; Edema due to malnutrition [facility]); AAA (abdominal aortic aneurysm) without rupture ([place]); History of repair of hip fracture; Rectal bleeding; and Chronic hip pain, right on their problem list. Her primarily concern today is the Back Pain (lower)  Pain Assessment: Location: Right Hip Radiating: down back of leg to calve Onset: More than a month ago Duration: Chronic pain Quality: Aching, Constant, Nagging, Discomfort Severity: 3 /10 (self-reported pain score)  Note: Reported level is compatible with observation.                          Effect on ADL: prolonged walking, daily ADL's Timing: Constant Modifying factors:  medications  Tasha Reyes was last scheduled for an appointment on 07/23/2017 for medication management. During today's appointment we reviewed Tasha Reyes's chronic pain status, as well as her outpatient medication regimen.  The patient  reports that she does not use drugs. Her body mass index is 16.95 kg/m.  Further details on both, my assessment(s), as well as the proposed treatment plan, please see below.  Controlled Substance Pharmacotherapy Assessment REMS (Risk Evaluation and Mitigation Strategy)  Analgesic:Hydrocodone/APAP 5/325 one tablet every 8 hours (15 mg/day) MME/day:15 mg/day.    Ignatius Specking, RN  10/21/2017  1:54 PM  Sign at close encounter Nursing Pain Medication Assessment:  Safety precautions to be maintained throughout the outpatient stay will include: orient to surroundings, keep bed in low position, maintain call bell within reach at all times, provide assistance with transfer out of bed and ambulation.  Medication Inspection Compliance: Pill count conducted under aseptic conditions, in front of the patient. Neither the pills nor the bottle was removed from the patient's sight at any time. Once count was completed pills were immediately returned to the patient in their original bottle.  Medication: See above Pill/Patch Count: 16 of 90 pills remain Pill/Patch Appearance: Markings consistent with prescribed medication Bottle Appearance: Standard pharmacy container. Clearly labeled. Filled Date: 3 / 55 / 2019 Last Medication intake:  Today   Pharmacokinetics: Liberation and absorption (onset of action): WNL Distribution (time to peak effect): WNL Metabolism and excretion (duration of action): WNL         Pharmacodynamics: Desired effects: Analgesia: [name] reports >50% benefit. Functional ability: Patient reports that medication allows her to accomplish basic ADLs Clinically meaningful improvement in function (CMIF): Sustained CMIF goals  met Perceived effectiveness: Described as relatively effective, allowing for  increase in activities of daily living (ADL) Undesirable effects: Side-effects or Adverse reactions: None reported Monitoring: [place] PMP: Online review of the past 59-monthperiod conducted. Compliant with practice rules and regulations Last UDS on record: Summary  Date Value Ref Range Status  01/22/2017 FINAL  Final    Comment:    ==================================================================== TOXASSURE SELECT 13 (MW) ==================================================================== Test                             Result       Flag       Units Drug Present and Declared for Prescription Verification   Hydrocodone                    1667         EXPECTED   ng/mg creat   Hydromorphone                  315          EXPECTED   ng/mg creat   Dihydrocodeine                 219          EXPECTED   ng/mg creat   Norhydrocodone                 >2347        EXPECTED   ng/mg creat    Sources of hydrocodone include scheduled prescription    medications. Hydromorphone, dihydrocodeine and norhydrocodone are    expected metabolites of hydrocodone. Hydromorphone and    dihydrocodeine are also available as scheduled prescription    medications. ==================================================================== Test                      Result    Flag   Units      Ref Range   Creatinine              213              mg/dL      >=20 ==================================================================== Declared Medications:  The flagging and interpretation on this report are based on the  following declared medications.  Unexpected results may arise from  inaccuracies in the declared medications.  **Note: The testing scope of this panel includes these medications:  Hydrocodone (Hydrocodone-Acetaminophen)  **Note: The testing scope of this panel does not include following  reported medications:  Acetaminophen  (Hydrocodone-Acetaminophen)  Albuterol  Atenolol  Atorvastatin  Benzonatate  Cholecalciferol  Clopidogrel  Iron (Ferrous Sulfate)  Magnesium Oxide  Methocarbamol  Mirtazapine  Pantoprazole (Protonix)  Polyethylene Glycol  Supplement ==================================================================== For clinical consultation, please call ([phone] ====================================================================    UDS interpretation: Compliant          Medication Assessment Form: Reviewed. Patient indicates being compliant with therapy Treatment compliance: Compliant Risk Assessment Profile: Aberrant behavior: See prior evaluations. None observed or detected today Comorbid factors increasing risk of overdose: See prior notes. No additional risks detected today Risk of substance use disorder (SUD): Low Opioid Risk Tool - 10/21/17 1352      Family History of Substance Abuse   Alcohol  Negative    Illegal Drugs  Negative    Rx Drugs  Negative      Personal History of Substance Abuse   Alcohol  Negative    Illegal Drugs  Negative    Rx Drugs  Negative  Psychological Disease   Psychological Disease  Negative    Depression  Negative      Total Score   Opioid Risk Tool Scoring  0    Opioid Risk Interpretation  Low Risk      ORT Scoring interpretation table:  Score <3 = Low Risk for SUD  Score between 4-7 = Moderate Risk for SUD  Score >8 = High Risk for Opioid Abuse   Risk Mitigation Strategies:  Patient Counseling: Covered Patient-Prescriber Agreement (PPA): Present and active  Notification to other healthcare providers: Done  Pharmacologic Plan: No change in therapy, at this time.             Laboratory Chemistry  Inflammation Markers (CRP: Acute Phase) (ESR: Chronic Phase) Lab Results  Component Value Date   CRP 1.1 (H) 01/08/2016   ESRSEDRATE 3 01/08/2016                         Rheumatology Markers No results found for: RF, ANA,  Therisa Doyne, [facility]                      Renal Function Markers Lab Results  Component Value Date   BUN 10 09/07/2017   CREATININE 0.68 09/07/2017   GFRAA >60 09/07/2017   GFRNONAA >60 09/07/2017                              Hepatic Function Markers Lab Results  Component Value Date   AST 21 09/06/2017   ALT 10 (L) 09/06/2017   ALBUMIN 4.1 09/06/2017   ALKPHOS 65 09/06/2017                        Electrolytes Lab Results  Component Value Date   NA 136 09/07/2017   K 4.0 09/07/2017   CL 101 09/07/2017   CALCIUM 7.8 (L) 09/07/2017   MG 2.0 01/08/2016                        Neuropathy Markers No results found for: VITAMINB12, FOLATE, HGBA1C, HIV                      Bone Pathology Markers Lab Results  Component Value Date   VD25OH 5.0 (L) 01/08/2016   25OHVITD1 58 04/15/2016   25OHVITD2 32 04/15/2016   25OHVITD3 26 04/15/2016                         Coagulation Parameters Lab Results  Component Value Date   INR 0.97 09/06/2017   LABPROT 12.8 09/06/2017   APTT 110.0 (H) 11/15/2011   PLT 347 09/07/2017                        Cardiovascular Markers Lab Results  Component Value Date   BNP 416.0 (H) 08/19/2016   TROPONINI <0.03 08/19/2016   HGB 9.6 (L) 09/07/2017   HCT 29.3 (L) 09/07/2017                         CA Markers No results found for: CEA, CA125, LABCA2                      Note: Lab results reviewed.  Recent Diagnostic Imaging  Results   Complexity Note: Imaging results reviewed. Results shared with Ms. Momon, using [employer].                         Meds   Current Outpatient Medications:  .  atenolol (TENORMIN) 25 MG tablet, TAKE 1 TABLET(25 MG) BY MOUTH TWICE DAILY, Disp: 60 tablet, Rfl: 5 .  atorvastatin (LIPITOR) 10 MG tablet, TAKE 1 TABLET BY MOUTH EVERY NIGHT AT BEDTIME, Disp: 90 tablet, Rfl: 0 .  Cholecalciferol (VITAMIN D3) 2000 units capsule, Take 1 capsule (2,000 Units total) by mouth daily., Disp:  30 capsule, Rfl: 12 .  clopidogrel (PLAVIX) 75 MG tablet, Take 1 tablet (75 mg total) by mouth daily. Please resume on Friday 09/12/2017, Disp: 30 tablet, Rfl: 5 .  ferrous sulfate 325 (65 FE) MG tablet, TAKE 1 TABLET(325 MG) BY MOUTH DAILY, Disp: 30 tablet, Rfl: 12 .  methocarbamol (ROBAXIN) 750 MG tablet, Take 1 tablet (750 mg total) by mouth 3 (three) times daily as needed for muscle spasms., Disp: 90 tablet, Rfl: 2 .  polyethylene glycol powder (GLYCOLAX/MIRALAX) powder, MIX AND TAKE 17 GRAMS BY MOUTH prn, Disp: , Rfl: 5 .  predniSONE (DELTASONE) 5 MG tablet, Take 1 tablet by mouth daily., Disp: , Rfl: 5 .  [START ON 10/26/2017] HYDROcodone-acetaminophen (NORCO/VICODIN) 5-325 MG tablet, Take 1 tablet by mouth every 8 (eight) hours as needed for severe pain., Disp: 90 tablet, Rfl: 0 .  [START ON 12/25/2017] HYDROcodone-acetaminophen (NORCO/VICODIN) 5-325 MG tablet, Take 1 tablet by mouth every 8 (eight) hours as needed for severe pain., Disp: 90 tablet, Rfl: 0 .  [START ON 11/25/2017] HYDROcodone-acetaminophen (NORCO/VICODIN) 5-325 MG tablet, Take 1 tablet by mouth every 8 (eight) hours as needed for severe pain., Disp: 90 tablet, Rfl: 0  ROS  Constitutional: Denies any fever or chills Gastrointestinal: No reported hemesis, hematochezia, vomiting, or acute GI distress Musculoskeletal: Denies any acute onset joint swelling, redness, loss of ROM, or weakness Neurological: No reported episodes of acute onset apraxia, aphasia, dysarthria, agnosia, amnesia, paralysis, loss of coordination, or loss of consciousness  Allergies  Ms. Diskin has No Known Allergies.  PFSH  Drug: Ms. Briones  reports that she does not use drugs. Alcohol:  reports that she does not drink alcohol. Tobacco:  reports that she has been smoking cigarettes.  She has a 15.25 pack-year smoking history. She has never used smokeless tobacco. Medical:  has a past medical history of Aneurysm of iliac artery ([place]) (05/11/2015),  COPD (chronic obstructive pulmonary disease) ([place]), Cystitis, History of DVT (deep vein thrombosis) (05/11/2015), History of peptic ulcer disease (05/11/2015), [name], Hypertension, Losing weight, Migraines, [name], Rectal bleeding, Stroke ([place]), and Vascular disease. Surgical: Ms. Butkiewicz  has a past surgical history that includes Partial hysterectomy; [name]; [name], 2008); Back surgery; Colonoscopy (2012); Esophagogastroduodenoscopy (04/2013); Abdominal hysterectomy; Appendectomy; blood clot ; Hip Arthroplasty (Right, 06/17/2017); and Joint replacement (Right, 06/19/2017). Family: family history includes Brain cancer in her father; Colon cancer in her brother; Kidney disease in her mother.  Constitutional Exam  General appearance: Well nourished, well developed, and well hydrated. In no apparent acute distress Vitals:   10/21/17 1342  BP: (!) 105/57  Pulse: 70  Resp: 16  Temp: 98.4 F (36.9 C)  SpO2: 92%  Weight: 105 lb (47.6 kg)  Height: '5\' 6"'  (1.676 m)   BMI Assessment: Estimated body mass index is 16.95 kg/m as  calculated from the following:   Height as of this encounter: '5\' 6"'  (1.676 m).   Weight as of this encounter: 105 lb (47.6 kg).  Wt Readings from Last 4 Encounters:  10/21/17 105 lb (47.6 kg)  09/06/17 105 lb (47.6 kg)  08/01/17 107 lb (48.5 kg)  07/23/17 109 lb (49.4 kg)  Psych/Mental status: Alert, oriented x 3 (person, place, & time)       Eyes: PERLA Respiratory: No evidence of acute respiratory distress  Cervical Spine Area Exam  Skin & Axial Inspection: No masses, redness, edema, swelling, or associated skin lesions Alignment: Symmetrical Functional ROM: Unrestricted ROM      Stability: No instability detected Muscle Tone/Strength: Functionally intact. No obvious neuro-muscular anomalies detected. Sensory (Neurological): Unimpaired Palpation:  No palpable anomalies              Upper Extremity (UE) Exam    Side: Right upper extremity  Side: Left upper extremity  Skin & Extremity Inspection: Skin color, temperature, and hair growth are WNL. No peripheral edema or cyanosis. No masses, redness, swelling, asymmetry, or associated skin lesions. No contractures.  Skin & Extremity Inspection: Skin color, temperature, and hair growth are WNL. No peripheral edema or cyanosis. No masses, redness, swelling, asymmetry, or associated skin lesions. No contractures.  Functional ROM: Unrestricted ROM          Functional ROM: Unrestricted ROM          Muscle Tone/Strength: Functionally intact. No obvious neuro-muscular anomalies detected.  Muscle Tone/Strength: Functionally intact. No obvious neuro-muscular anomalies detected.  Sensory (Neurological): Unimpaired          Sensory (Neurological): Unimpaired          Palpation: No palpable anomalies              Palpation: No palpable anomalies              Specialized Test(s): Deferred         Specialized Test(s): Deferred          Thoracic Spine Area Exam  Skin & Axial Inspection: No masses, redness, or swelling Alignment: Symmetrical Functional ROM: Unrestricted ROM Stability: No instability detected Muscle Tone/Strength: Functionally intact. No obvious neuro-muscular anomalies detected. Sensory (Neurological): Unimpaired Muscle strength & Tone: No palpable anomalies  Lumbar Spine Area Exam  Skin & Axial Inspection: No masses, redness, or swelling Alignment: Symmetrical Functional ROM: Unrestricted ROM      Stability: No instability detected Muscle Tone/Strength: Functionally intact. No obvious neuro-muscular anomalies detected. Sensory (Neurological): Unimpaired Palpation: Non-tender       Provocative Tests: Lumbar Hyperextension and rotation test: evaluation deferred today       Lumbar Lateral bending test: evaluation deferred today       Patrick's Maneuver: evaluation deferred today                     Gait & Posture Assessment  Ambulation: Patient ambulates using a walker Gait: Relatively normal for age and body habitus Posture: WNL   Lower Extremity Exam    Side: Right lower extremity  Side: Left lower extremity  Skin & Extremity Inspection: Evidence of prior arthroplastic surgery  Skin & Extremity Inspection: Skin color, temperature, and hair growth are WNL. No peripheral edema or cyanosis. No masses, redness, swelling, asymmetry, or associated skin lesions. No contractures.  Functional ROM: Unrestricted ROM          Functional ROM: Unrestricted ROM  Muscle Tone/Strength: Functionally intact. No obvious neuro-muscular anomalies detected.  Muscle Tone/Strength: Functionally intact. No obvious neuro-muscular anomalies detected.  Sensory (Neurological): Unimpaired  Sensory (Neurological): Unimpaired  Palpation: Non-tender  Palpation: No palpable anomalies   Assessment  Primary Diagnosis & Pertinent Problem List: The primary encounter diagnosis was Facet syndrome, lumbar ([place]). Diagnoses of Lumbar foraminal stenosis (Right) (Severe at L4-5), Chronic hip pain, right, Musculoskeletal pain, Vitamin D deficiency, Chronic pain syndrome, and Long term prescription opiate use were also pertinent to this visit.  Status Diagnosis  Controlled Controlled Controlled 1. Facet syndrome, lumbar ([place])   2. Lumbar foraminal stenosis (Right) (Severe at L4-5)   3. Chronic hip pain, right   4. Musculoskeletal pain   5. Vitamin D deficiency   6. Chronic pain syndrome   7. Long term prescription opiate use     Problems updated and reviewed during this visit: Problem  Chronic Hip Pain, Right   Plan of Care  Pharmacotherapy (Medications Ordered): Meds ordered this encounter  Medications  . HYDROcodone-acetaminophen (NORCO/VICODIN) 5-325 MG tablet    Sig: Take 1 tablet by mouth every 8 (eight) hours as needed for severe pain.    Dispense:  90 tablet    Refill:  0    Do not  place this medication, or any other prescription from our practice, on "Automatic Refill". Patient may have prescription filled one day early if pharmacy is closed on scheduled refill date. Do not fill until: 10/26/2017 To last until: 11/25/2017    Order Specific Question:   Supervising Provider    Answer:   Milinda Pointer [phone]  . HYDROcodone-acetaminophen (NORCO/VICODIN) 5-325 MG tablet    Sig: Take 1 tablet by mouth every 8 (eight) hours as needed for severe pain.    Dispense:  90 tablet    Refill:  0    Do not place this medication, or any other prescription from our practice, on "Automatic Refill". Patient may have prescription filled one day early if pharmacy is closed on scheduled refill date. Do not fill until:12/25/2017 To last until: 01/24/2018    Order Specific Question:   Supervising Provider    Answer:   Milinda Pointer [phone]  . HYDROcodone-acetaminophen (NORCO/VICODIN) 5-325 MG tablet    Sig: Take 1 tablet by mouth every 8 (eight) hours as needed for severe pain.    Dispense:  90 tablet    Refill:  0    Do not place this medication, or any other prescription from our practice, on "Automatic Refill". Patient may have prescription filled one day early if pharmacy is closed on scheduled refill date. Do not fill until:11/25/2017 To last until: 12/25/2017    Order Specific Question:   Supervising Provider    Answer:   Milinda Pointer [phone]  . methocarbamol (ROBAXIN) 750 MG tablet    Sig: Take 1 tablet (750 mg total) by mouth 3 (three) times daily as needed for muscle spasms.    Dispense:  90 tablet    Refill:  2    Do not add this medication to the electronic "Automatic Refill" notification system. Patient may have prescription filled one day early if pharmacy is closed on scheduled refill date.    Order Specific Question:   Supervising Provider    Answer:   Milinda Pointer [phone]  . Cholecalciferol (VITAMIN D3) 2000 units capsule    Sig: Take 1 capsule  (2,000 Units total) by mouth daily.    Dispense:  30 capsule    Refill:  12  Do not place this medication, or any other prescription from our practice, on "Automatic Refill".    Order Specific Question:   Supervising Provider    Answer:   Milinda Pointer [[id number]]   New Prescriptions   No medications on file   Medications administered today: Betania F. Horine had no medications administered during this visit. Lab-work, procedure(s), and/or referral(s): Orders Placed This Encounter  Procedures  . ToxASSURE Select 13 (MW), Urine   Imaging and/or referral(s): None  Interventional therapies: Planned, scheduled, and/or pending:  Not at this time.   Considering:  Diagnostic right L4-5 transforaminal epidural steroid injection  Diagnostic right L4-5 lumbar epidural steroid injection  Diagnostic bilateral lumbar facet block  Possible bilateral lumbar facet radiofrequency ablation    Palliative PRN treatment(s):  Diagnostic right L4-5 transforaminal epidural steroid injection Diagnostic right L4-5 lumbar epidural steroid injection  Diagnostic bilateral lumbar facet block   Provider-requested follow-up: Return in about 3 months (around 01/20/2018) for MedMgmt with Me Dionisio David).  Future Appointments  Date Time Provider [place]  03/11/2018  1:30 PM [place] ADVISOR [facility] None  03/20/2018  9:30 AM Glean Hess, MD [facility] None  03/20/2018 10:00 AM [facility] None  03/20/2018 10:30 AM [facility] None  03/20/2018 11:30 AM Dew, Erskine Squibb, MD [facility] None   Primary Care Physician: Glean Hess, MD Location: [facility] Note by: Vevelyn Francois NP Date: 10/21/2017; Time: 2:30 PM  Pain Score Disclaimer: We use the NRS-11 scale. This is a self-reported, subjective measurement of pain severity with only modest accuracy. It is used primarily to identify changes within a particular patient. It must be  understood that outpatient pain scales are significantly less accurate that those used for research, where they can be applied under ideal controlled circumstances with minimal exposure to variables. In reality, the score is likely to be a combination of pain intensity and pain affect, where pain affect describes the degree of emotional arousal or changes in action readiness caused by the sensory experience of pain. Factors such as social and work situation, setting, emotional state, anxiety levels, expectation, and prior pain experience may influence pain perception and show large inter-individual differences that may also be affected by time variables.  Patient instructions provided during this appointment: Patient Instructions  ____________________________________________________________________________________________  Medication Rules  Applies to: All patients receiving prescriptions (written or electronic).  Pharmacy of record: Pharmacy where electronic prescriptions will be sent. If written prescriptions are taken to a different pharmacy, please inform the nursing staff. The pharmacy listed in the electronic medical record should be the one where you would like electronic prescriptions to be sent.  Prescription refills: Only during scheduled appointments. Applies to both, written and electronic prescriptions.  NOTE: The following applies primarily to controlled substances (Opioid* Pain Medications).   Patient's responsibilities: 1. Pain Pills: Bring all pain pills to every appointment (except for procedure appointments). 2. Pill Bottles: Bring pills in original pharmacy bottle. Always bring newest bottle. Bring bottle, even if empty. 3. Medication refills: You are responsible for knowing and keeping track of what medications you need refilled. The day before your appointment, write a list of all prescriptions that need to be refilled. Bring that list to your appointment and give it to the  admitting nurse. Prescriptions will be written only during appointments. If you forget a medication, it will not be "Called in", "Faxed", or "electronically sent". You will need to get another appointment to  get these prescribed. 4. Prescription Accuracy: You are responsible for carefully inspecting your prescriptions before leaving our office. Have the discharge nurse carefully go over each prescription with you, before taking them home. Make sure that your name is accurately spelled, that your address is correct. Check the name and dose of your medication to make sure it is accurate. Check the number of pills, and the written instructions to make sure they are clear and accurate. Make sure that you are given enough medication to last until your next medication refill appointment. 5. Taking Medication: Take medication as prescribed. Never take more pills than instructed. Never take medication more frequently than prescribed. Taking less pills or less frequently is permitted and encouraged, when it comes to controlled substances (written prescriptions).  6. Inform other Doctors: Always inform, all of your healthcare providers, of all the medications you take. 7. Pain Medication from other Providers: You are not allowed to accept any additional pain medication from any other Doctor or Healthcare provider. There are two exceptions to this rule. (see below) In the event that you require additional pain medication, you are responsible for notifying us, as stated below. 8. Medication Agreement: You are responsible for carefully reading and following our Medication Agreement. This must be signed before receiving any prescriptions from our practice. Safely store a copy of your signed Agreement. Violations to the Agreement will result in no further prescriptions. (Additional copies of our Medication Agreement are available upon request.) 9. Laws, Rules, & Regulations: All patients are expected to follow all Federal  and [employer], [employer], Rules, [employer]. Ignorance of the Laws does not constitute a valid excuse. The use of any illegal substances is prohibited. 10. Adopted CDC guidelines & recommendations: Target dosing levels will be at or below 60 MME/day. Use of benzodiazepines** is not recommended.  Exceptions: There are only two exceptions to the rule of not receiving pain medications from other Healthcare Providers. 1. Exception #1 (Emergencies): In the event of an emergency (i.e.: accident requiring emergency care), you are allowed to receive additional pain medication. However, you are responsible for: As soon as you are able, call our office [phone], at any time of the day or night, and leave a message stating your name, the date and nature of the emergency, and the name and dose of the medication prescribed. In the event that your call is answered by a member of our staff, make sure to document and save the date, time, and the name of the person that took your information.  2. Exception #2 (Planned Surgery): In the event that you are scheduled by another doctor or dentist to have any type of surgery or procedure, you are allowed (for a period no longer than 30 days), to receive additional pain medication, for the acute post-op pain. However, in this case, you are responsible for picking up a copy of our "Post-op Pain Management for Surgeons" handout, and giving it to your surgeon or dentist. This document is available at our office, and does not require an appointment to obtain it. Simply go to our office during business hours (Monday-Thursday from 8:00 AM to 4:00 PM) (Friday 8:00 AM to 12:00 Noon) or if you have a scheduled appointment with [place], prior to your surgery, and ask for it by name. In addition, you will need to provide [place] with your name, name of your surgeon, type of surgery, and date of procedure or surgery.  *Opioid medications include: morphine, codeine, oxycodone, oxymorphone,  hydrocodone,  hydromorphone, meperidine, tramadol, tapentadol, buprenorphine, fentanyl, methadone. **Benzodiazepine medications include: diazepam (Valium), alprazolam (Xanax), clonazepam (Klonopine), lorazepam (Ativan), clorazepate (Tranxene), chlordiazepoxide (Librium), estazolam (Prosom), oxazepam (Serax), temazepam (Restoril), triazolam (Halcion) (Last updated: 09/11/2017) ____________________________________________________________________________________________

## 2017-10-21 NOTE — Progress Notes (Signed)
Nursing Pain Medication Assessment:  Safety precautions to be maintained throughout the outpatient stay will include: orient to surroundings, keep bed in low position, maintain call bell within reach at all times, provide assistance with transfer out of bed and ambulation.  Medication Inspection Compliance: Pill count conducted under aseptic conditions, in front of the patient. Neither the pills nor the bottle was removed from the patient's sight at any time. Once count was completed pills were immediately returned to the patient in their original bottle.  Medication: See above Pill/Patch Count: 16 of 90 pills remain Pill/Patch Appearance: Markings consistent with prescribed medication Bottle Appearance: Standard pharmacy container. Clearly labeled. Filled Date: 3 / 80 / 2019 Last Medication intake:  Today

## 2017-10-21 NOTE — Patient Instructions (Addendum)
____________________________________________________________________________________________  Medication Rules  Applies to: All patients receiving prescriptions (written or electronic).  Pharmacy of record: Pharmacy where electronic prescriptions will be sent. If written prescriptions are taken to a different pharmacy, please inform the nursing staff. The pharmacy listed in the electronic medical record should be the one where you would like electronic prescriptions to be sent.  Prescription refills: Only during scheduled appointments. Applies to both, written and electronic prescriptions.  NOTE: The following applies primarily to controlled substances (Opioid* Pain Medications).   Patient's responsibilities: 1. Pain Pills: Bring all pain pills to every appointment (except for procedure appointments). 2. Pill Bottles: Bring pills in original pharmacy bottle. Always bring newest bottle. Bring bottle, even if empty. 3. Medication refills: You are responsible for knowing and keeping track of what medications you need refilled. The day before your appointment, write a list of all prescriptions that need to be refilled. Bring that list to your appointment and give it to the admitting nurse. Prescriptions will be written only during appointments. If you forget a medication, it will not be "Called in", "Faxed", or "electronically sent". You will need to get another appointment to get these prescribed. 4. Prescription Accuracy: You are responsible for carefully inspecting your prescriptions before leaving our office. Have the discharge nurse carefully go over each prescription with you, before taking them home. Make sure that your name is accurately spelled, that your address is correct. Check the name and dose of your medication to make sure it is accurate. Check the number of pills, and the written instructions to make sure they are clear and accurate. Make sure that you are given enough medication to last  until your next medication refill appointment. 5. Taking Medication: Take medication as prescribed. Never take more pills than instructed. Never take medication more frequently than prescribed. Taking less pills or less frequently is permitted and encouraged, when it comes to controlled substances (written prescriptions).  6. Inform other Doctors: Always inform, all of your healthcare providers, of all the medications you take. 7. Pain Medication from other Providers: You are not allowed to accept any additional pain medication from any other Doctor or Healthcare provider. There are two exceptions to this rule. (see below) In the event that you require additional pain medication, you are responsible for notifying us, as stated below. 8. Medication Agreement: You are responsible for carefully reading and following our Medication Agreement. This must be signed before receiving any prescriptions from our practice. Safely store a copy of your signed Agreement. Violations to the Agreement will result in no further prescriptions. (Additional copies of our Medication Agreement are available upon request.) 9. Laws, Rules, & Regulations: All patients are expected to follow all Federal and State Laws, Statutes, Rules, & Regulations. Ignorance of the Laws does not constitute a valid excuse. The use of any illegal substances is prohibited. 10. Adopted CDC guidelines & recommendations: Target dosing levels will be at or below 60 MME/day. Use of benzodiazepines** is not recommended.  Exceptions: There are only two exceptions to the rule of not receiving pain medications from other Healthcare Providers. 1. Exception #1 (Emergencies): In the event of an emergency (i.e.: accident requiring emergency care), you are allowed to receive additional pain medication. However, you are responsible for: As soon as you are able, call our office [phone], at any time of the day or night, and leave a message stating your name, the  date and nature of the emergency, and the name and dose of the medication   prescribed. In the event that your call is answered by a member of our staff, make sure to document and save the date, time, and the name of the person that took your information.  2. Exception #2 (Planned Surgery): In the event that you are scheduled by another doctor or dentist to have any type of surgery or procedure, you are allowed (for a period no longer than 30 days), to receive additional pain medication, for the acute post-op pain. However, in this case, you are responsible for picking up a copy of our "Post-op Pain Management for Surgeons" handout, and giving it to your surgeon or dentist. This document is available at our office, and does not require an appointment to obtain it. Simply go to our office during business hours (Monday-Thursday from 8:00 AM to 4:00 PM) (Friday 8:00 AM to 12:00 Noon) or if you have a scheduled appointment with [place], prior to your surgery, and ask for it by name. In addition, you will need to provide [place] with your name, name of your surgeon, type of surgery, and date of procedure or surgery.  *Opioid medications include: morphine, codeine, oxycodone, oxymorphone, hydrocodone, hydromorphone, meperidine, tramadol, tapentadol, buprenorphine, fentanyl, methadone. **Benzodiazepine medications include: diazepam (Valium), alprazolam (Xanax), clonazepam (Klonopine), lorazepam (Ativan), clorazepate (Tranxene), chlordiazepoxide (Librium), estazolam (Prosom), oxazepam (Serax), temazepam (Restoril), triazolam (Halcion) (Last updated: 09/11/2017) ____________________________________________________________________________________________   Tasha Reyes have been given 3 scripts for hydrocodone today.  You have obtained a UDS today.

## 2017-10-26 LAB — TOXASSURE SELECT 13 (MW), URINE

## 2017-11-26 ENCOUNTER — Other Ambulatory Visit: Payer: Self-pay | Admitting: Internal Medicine

## 2017-12-07 ENCOUNTER — Other Ambulatory Visit: Payer: Self-pay | Admitting: Internal Medicine

## 2017-12-23 ENCOUNTER — Encounter: Payer: Self-pay | Admitting: Internal Medicine

## 2017-12-23 ENCOUNTER — Ambulatory Visit (INDEPENDENT_AMBULATORY_CARE_PROVIDER_SITE_OTHER): Payer: Medicare Other | Admitting: Internal Medicine

## 2017-12-23 VITALS — BP 158/74 | HR 70 | Temp 98.0°F | Resp 16 | Ht 66.0 in | Wt 102.0 lb

## 2017-12-23 DIAGNOSIS — Z9981 Dependence on supplemental oxygen: Secondary | ICD-10-CM | POA: Diagnosis not present

## 2017-12-23 DIAGNOSIS — J449 Chronic obstructive pulmonary disease, unspecified: Secondary | ICD-10-CM | POA: Diagnosis not present

## 2017-12-23 DIAGNOSIS — E43 Unspecified severe protein-calorie malnutrition: Secondary | ICD-10-CM

## 2017-12-23 DIAGNOSIS — I739 Peripheral vascular disease, unspecified: Secondary | ICD-10-CM | POA: Diagnosis not present

## 2017-12-23 DIAGNOSIS — Z9889 Other specified postprocedural states: Secondary | ICD-10-CM

## 2017-12-23 NOTE — Progress Notes (Signed)
Date:  12/23/2017   Name:  Tasha Reyes   DOB:  14-Mar-1931   MRN:  [id number]   Chief Complaint: Follow-up (oxygen tank ) Leg Pain   There was no injury mechanism. The pain is present in the right leg and left leg. The quality of the pain is described as burning. The pain is moderate. The pain has been fluctuating since onset. Pertinent negatives include no muscle weakness, numbness or tingling. She reports no foreign bodies present. The symptoms are aggravated by weight bearing (has known PAD and continues to smoke).  Hip Pain   Injury mechanism: hip replacement 6 months ago. The pain is present in the right hip. The quality of the pain is described as shooting. The pain is moderate. Pertinent negatives include no muscle weakness, numbness or tingling. Treatments tried: has hydrocodone. The treatment provided mild relief.  COPD dependent on oxygen - started on nocturnal oxygen while hospitalized.  She uses it during the day at home if she is active.  She takes it off while sitting.  She does not use it when she goes out of the house due to the weight of the mobile tank.  Throat closing - has intermittent sx of something in the back of her throat - causes her to cough and gag but never coughs up anything.  The episodes lasts a few minutes then stops.  Not associated consistently with eating, drinking or talking.   Review of Systems  Constitutional: Positive for unexpected weight change (still losing some weight). Negative for diaphoresis, fatigue and fever.  HENT: Positive for trouble swallowing (intermittent issues).   Respiratory: Positive for shortness of breath. Negative for choking, chest tightness and wheezing.   Cardiovascular: Negative for chest pain, palpitations and leg swelling.  Gastrointestinal: Negative for constipation and diarrhea.  Musculoskeletal: Positive for arthralgias, gait problem and myalgias.  Neurological: Negative for tingling and numbness.    Patient  Active Problem List   Diagnosis Date Noted  . Chronic hip pain, right 10/21/2017  . Rectal bleeding 09/06/2017  . History of repair of hip fracture 06/16/2017  . AAA (abdominal aortic aneurysm) without rupture ([place]) 03/18/2017  . Edema due to malnutrition ([place]) 10/28/2016  . Protein-calorie malnutrition, severe 08/20/2016  . Vitamin D deficiency 01/10/2016  . Opioid-induced constipation ([facility]) 11/07/2015  . Osteoporosis, post-menopausal 11/07/2015  . Lumbar foraminal stenosis (Right) (Severe at L4-5) 06/20/2015  . Lumbosacral Radiculopathy (Right) 05/11/2015  . Peripheral vascular disease ([place]) 05/11/2015  . Hx of long term use of blood thinners (Plavix) 05/11/2015  . Lower extremity pain (Right) 05/11/2015  . Neurogenic pain 05/11/2015  . History of DVT (deep vein thrombosis) 05/11/2015  . Facet syndrome, lumbar ([place]) 05/11/2015  . Dyslipidemia 12/23/2014  . Compulsive tobacco user syndrome 12/23/2014  . Chronic peptic ulcer with bleeding 12/23/2014  . Essential (primary) hypertension 12/23/2014  . Depression, major, recurrent, in partial remission ([place]) 12/23/2014  . COPD (chronic obstructive pulmonary disease) ([place]) 12/23/2014  . Restless leg 12/23/2014  . Chronic tension-type headache, intractable 12/23/2014    Prior to Admission medications   Medication Sig Start Date End Date Taking? Authorizing Provider  albuterol (PROVENTIL) (2.5 MG/3ML) 0.083% nebulizer solution USE 1 VIAL VIA NEBULIZER EVERY 4 HOURS AS NEEDED FOR WHEEZING OR SHORTNESS OF BREATH 11/26/17  Yes Glean Hess, MD  atenolol (TENORMIN) 25 MG tablet TAKE 1 TABLET(25 MG) BY MOUTH TWICE DAILY 06/29/17  Yes Glean Hess, MD  atorvastatin (LIPITOR) 10 MG tablet TAKE 1 TABLET  BY MOUTH EVERY NIGHT AT BEDTIME 12/09/17  Yes Glean Hess, MD  Cholecalciferol (VITAMIN D3) 2000 units capsule Take 1 capsule (2,000 Units total) by mouth daily. 10/21/17  Yes Vevelyn Francois, NP  clopidogrel (PLAVIX) 75 MG tablet Take  1 tablet (75 mg total) by mouth daily. Please resume on Friday 09/12/2017 09/12/17  Yes Dustin Flock, MD  ferrous sulfate 325 (65 FE) MG tablet TAKE 1 TABLET(325 MG) BY MOUTH DAILY 01/01/17  Yes Glean Hess, MD  HYDROcodone-acetaminophen (NORCO/VICODIN) 5-325 MG tablet Take 1 tablet by mouth every 8 (eight) hours as needed for severe pain. 12/25/17 01/24/18 Yes Vevelyn Francois, NP  HYDROcodone-acetaminophen (NORCO/VICODIN) 5-325 MG tablet Take 1 tablet by mouth every 8 (eight) hours as needed for severe pain. 11/25/17 12/25/17 Yes Vevelyn Francois, NP  methocarbamol (ROBAXIN) 750 MG tablet Take 1 tablet (750 mg total) by mouth 3 (three) times daily as needed for muscle spasms. 10/21/17 01/19/18 Yes [place], Diona Foley, NP  OXYGEN Inhale 2 L/min into the lungs. Use via nasal cannula at night and during the day as needed   Yes [provider]  polyethylene glycol powder (GLYCOLAX/MIRALAX) powder MIX AND TAKE 17 GRAMS BY MOUTH prn 01/30/16  Yes [provider]  predniSONE (DELTASONE) 5 MG tablet Take 1 tablet by mouth daily. 08/23/17  Yes [provider]    No Known Allergies  Past Surgical History:  Procedure Laterality Date  . ABDOMINAL AORTIC ANEURYSM REPAIR W/ ENDOLUMINAL GRAFT  2008  . ABDOMINAL HYSTERECTOMY    . APPENDECTOMY    . BACK SURGERY    . blood clot      removal right leg  . COLONOSCOPY  2012  . ESOPHAGOGASTRODUODENOSCOPY  04/2013   gastric ulcers  . FEMORAL-POPLITEAL BYPASS GRAFT Right 2008  . HIP ARTHROPLASTY Right 06/17/2017   Procedure: ARTHROPLASTY BIPOLAR HIP (HEMIARTHROPLASTY);  Surgeon: Earnestine Leys, MD;  Location: [facility] ORS;  Service: Orthopedics;  Laterality: Right;  . JOINT REPLACEMENT Right 06/19/2017   right hip FX.  Marland Kitchen PARTIAL HYSTERECTOMY      Social History   Tobacco Use  . Smoking status: Current Every Day Smoker    Packs/day: 0.25    Years: 61.00    Pack years: 15.25    Types: Cigarettes  . Smokeless tobacco: Never Used  . Tobacco  comment: 3 cigrettes a day- cut back  Substance Use Topics  . Alcohol use: No    Alcohol/week: 0.0 oz  . Drug use: No     Medication list has been reviewed and updated.  Current Meds  Medication Sig  . albuterol (PROVENTIL) (2.5 MG/3ML) 0.083% nebulizer solution USE 1 VIAL VIA NEBULIZER EVERY 4 HOURS AS NEEDED FOR WHEEZING OR SHORTNESS OF BREATH  . atenolol (TENORMIN) 25 MG tablet TAKE 1 TABLET(25 MG) BY MOUTH TWICE DAILY  . atorvastatin (LIPITOR) 10 MG tablet TAKE 1 TABLET BY MOUTH EVERY NIGHT AT BEDTIME  . Cholecalciferol (VITAMIN D3) 2000 units capsule Take 1 capsule (2,000 Units total) by mouth daily.  . clopidogrel (PLAVIX) 75 MG tablet Take 1 tablet (75 mg total) by mouth daily. Please resume on Friday 09/12/2017  . ferrous sulfate 325 (65 FE) MG tablet TAKE 1 TABLET(325 MG) BY MOUTH DAILY  . [START ON 12/25/2017] HYDROcodone-acetaminophen (NORCO/VICODIN) 5-325 MG tablet Take 1 tablet by mouth every 8 (eight) hours as needed for severe pain.  Marland Kitchen HYDROcodone-acetaminophen (NORCO/VICODIN) 5-325 MG tablet Take 1 tablet by mouth every 8 (eight) hours as needed for severe pain.  Marland Kitchen  methocarbamol (ROBAXIN) 750 MG tablet Take 1 tablet (750 mg total) by mouth 3 (three) times daily as needed for muscle spasms.  . OXYGEN Inhale 2 L/min into the lungs. Use via nasal cannula at night and during the day as needed  . polyethylene glycol powder (GLYCOLAX/MIRALAX) powder MIX AND TAKE 17 GRAMS BY MOUTH prn  . predniSONE (DELTASONE) 5 MG tablet Take 1 tablet by mouth daily.    PHQ 2/9 Scores 10/21/2017 08/01/2017 07/23/2017 04/30/2017  PHQ - 2 Score 0 0 0 0  PHQ- 9 Score - 0 - 0    Physical Exam  Constitutional: She is oriented to person, place, and time. She appears well-developed. No distress.  HENT:  Head: Normocephalic and atraumatic.  Cardiovascular: Normal rate, regular rhythm and normal heart sounds.  Pulses:      Dorsalis pedis pulses are 0 on the right side, and 0 on the left side.        Posterior tibial pulses are 0 on the right side, and 0 on the left side.  Pulmonary/Chest: Effort normal. No respiratory distress. She has decreased breath sounds. She has no wheezes.  Abdominal: Soft. Normal appearance. There is no tenderness.    Musculoskeletal: Normal range of motion.       Right hip: She exhibits decreased strength and tenderness.  Neurological: She is alert and oriented to person, place, and time.  Skin: Skin is warm and dry. No rash noted.  Psychiatric: She has a normal mood and affect. Her behavior is normal. Thought content normal.  Nursing note and vitals reviewed.   BP (!) 158/74   Pulse 70   Temp 98 F (36.7 C) (Oral)   Resp 16   Ht 5\' 6"  (1.676 m)   Wt 102 lb (46.3 kg)   SpO2 91%   BMI 16.46 kg/m   Assessment and Plan: 1. Chronic obstructive pulmonary disease, unspecified COPD type ([place]) Continues to smoke Oxygen verified and form will be submitted O2 low on room air with minimal exertion  2. Peripheral vascular disease ([place]) Continues to smoke Likely the cause of bilateral leg pain rec follow up with [facility] sooner than planned  3. Protein-calorie malnutrition, severe Resume ensure or Boost for additional nutrition  4. History of repair of hip fracture With persistent pain - continue hydrocodone Use rollator as needed for support and safety Follow up with Ortho for repeat films  5. Dependence on supplemental oxygen Continue O2 as prescribed  No orders of the defined types were placed in this encounter.   Partially dictated using Editor, commissioning. Any errors are unintentional.  Halina Maidens, MD [facility]  12/23/2017   There are no diagnoses linked to this encounter.

## 2017-12-26 ENCOUNTER — Other Ambulatory Visit: Payer: Self-pay

## 2017-12-26 DIAGNOSIS — Z9981 Dependence on supplemental oxygen: Secondary | ICD-10-CM

## 2018-01-20 ENCOUNTER — Other Ambulatory Visit: Payer: Self-pay | Admitting: *Deleted

## 2018-01-20 ENCOUNTER — Ambulatory Visit: Payer: Medicare Other | Attending: Nurse Practitioner | Admitting: Nurse Practitioner

## 2018-01-20 ENCOUNTER — Encounter: Payer: Self-pay | Admitting: Nurse Practitioner

## 2018-01-20 ENCOUNTER — Other Ambulatory Visit: Payer: Self-pay

## 2018-01-20 VITALS — BP 168/74 | HR 66 | Temp 98.2°F | Resp 20 | Ht 65.0 in | Wt 97.0 lb

## 2018-01-20 DIAGNOSIS — M7918 Myalgia, other site: Secondary | ICD-10-CM | POA: Diagnosis not present

## 2018-01-20 DIAGNOSIS — M792 Neuralgia and neuritis, unspecified: Secondary | ICD-10-CM

## 2018-01-20 DIAGNOSIS — K625 Hemorrhage of anus and rectum: Secondary | ICD-10-CM | POA: Diagnosis not present

## 2018-01-20 DIAGNOSIS — M5440 Lumbago with sciatica, unspecified side: Secondary | ICD-10-CM | POA: Insufficient documentation

## 2018-01-20 DIAGNOSIS — Z90710 Acquired absence of both cervix and uterus: Secondary | ICD-10-CM | POA: Diagnosis not present

## 2018-01-20 DIAGNOSIS — Z8673 Personal history of transient ischemic attack (TIA), and cerebral infarction without residual deficits: Secondary | ICD-10-CM | POA: Diagnosis not present

## 2018-01-20 DIAGNOSIS — E559 Vitamin D deficiency, unspecified: Secondary | ICD-10-CM | POA: Diagnosis not present

## 2018-01-20 DIAGNOSIS — Z79899 Other long term (current) drug therapy: Secondary | ICD-10-CM | POA: Diagnosis not present

## 2018-01-20 DIAGNOSIS — F17210 Nicotine dependence, cigarettes, uncomplicated: Secondary | ICD-10-CM | POA: Diagnosis not present

## 2018-01-20 DIAGNOSIS — G894 Chronic pain syndrome: Secondary | ICD-10-CM

## 2018-01-20 DIAGNOSIS — M48061 Spinal stenosis, lumbar region without neurogenic claudication: Secondary | ICD-10-CM | POA: Diagnosis not present

## 2018-01-20 DIAGNOSIS — M79604 Pain in right leg: Secondary | ICD-10-CM

## 2018-01-20 DIAGNOSIS — M545 Low back pain: Secondary | ICD-10-CM | POA: Diagnosis not present

## 2018-01-20 DIAGNOSIS — E7800 Pure hypercholesterolemia, unspecified: Secondary | ICD-10-CM | POA: Diagnosis not present

## 2018-01-20 DIAGNOSIS — M79605 Pain in left leg: Secondary | ICD-10-CM | POA: Diagnosis present

## 2018-01-20 DIAGNOSIS — J449 Chronic obstructive pulmonary disease, unspecified: Secondary | ICD-10-CM | POA: Insufficient documentation

## 2018-01-20 DIAGNOSIS — M899 Disorder of bone, unspecified: Secondary | ICD-10-CM | POA: Diagnosis not present

## 2018-01-20 DIAGNOSIS — R110 Nausea: Secondary | ICD-10-CM

## 2018-01-20 DIAGNOSIS — Z96641 Presence of right artificial hip joint: Secondary | ICD-10-CM | POA: Insufficient documentation

## 2018-01-20 DIAGNOSIS — I10 Essential (primary) hypertension: Secondary | ICD-10-CM | POA: Diagnosis not present

## 2018-01-20 DIAGNOSIS — Z9889 Other specified postprocedural states: Secondary | ICD-10-CM | POA: Diagnosis not present

## 2018-01-20 DIAGNOSIS — G8929 Other chronic pain: Secondary | ICD-10-CM | POA: Insufficient documentation

## 2018-01-20 DIAGNOSIS — Z789 Other specified health status: Secondary | ICD-10-CM

## 2018-01-20 DIAGNOSIS — E43 Unspecified severe protein-calorie malnutrition: Secondary | ICD-10-CM

## 2018-01-20 DIAGNOSIS — M9983 Other biomechanical lesions of lumbar region: Secondary | ICD-10-CM | POA: Diagnosis not present

## 2018-01-20 MED ORDER — HYDROCODONE-ACETAMINOPHEN 5-325 MG PO TABS: 1.0000 | ORAL_TABLET | Freq: Three times a day (TID) | ORAL | 0 refills | Status: DC | PRN

## 2018-01-20 MED ORDER — ONDANSETRON 8 MG PO TBDP: 8.0000 mg | ORAL_TABLET | Freq: Two times a day (BID) | ORAL | 0 refills | Status: DC

## 2018-01-20 MED ORDER — GABAPENTIN 100 MG PO CAPS: 100.0000 mg | ORAL_CAPSULE | Freq: Every day | ORAL | 0 refills | Status: DC

## 2018-01-20 MED ORDER — METHOCARBAMOL 750 MG PO TABS: 750.0000 mg | ORAL_TABLET | Freq: Three times a day (TID) | ORAL | 2 refills | Status: DC | PRN

## 2018-01-20 NOTE — Progress Notes (Signed)
Nursing Pain Medication Assessment:  Safety precautions to be maintained throughout the outpatient stay will include: orient to surroundings, keep bed in low position, maintain call bell within reach at all times, provide assistance with transfer out of bed and ambulation.  Medication Inspection Compliance: Pill count conducted under aseptic conditions, in front of the patient. Neither the pills nor the bottle was removed from the patient's sight at any time. Once count was completed pills were immediately returned to the patient in their original bottle.  Medication: Hydrocodone/APAP Pill/Patch Count: 15 of 90 pills remain Pill/Patch Appearance: Markings consistent with prescribed medication Bottle Appearance: Standard pharmacy container. Clearly labeled. Filled Date: 06 / 14 / 2019 Last Medication intake:  Today

## 2018-01-20 NOTE — Progress Notes (Addendum)
Patient's Name: Tasha Reyes  MRN: [id number]  Referring Provider: Glean Hess, MD  DOB: June 17, 1931  PCP: Glean Hess, MD  DOS: 01/20/2018  Note by: Vevelyn Francois NP  Service setting: Ambulatory outpatient  Specialty: Interventional Pain Management  Location: [facility] (AMB) Pain Management Facility    Patient type: Established    Primary Reason(s) for Visit: Encounter for prescription drug management. (Level of risk: moderate)  CC: Leg Pain (bilateral) and Pain (bilateral buttock)  HPI  Tasha Reyes is a 84 y.o. year old, female patient, who comes today for a medication management evaluation. She has Dyslipidemia; Compulsive tobacco user syndrome; Chronic peptic ulcer with bleeding; Essential (primary) hypertension; Depression, major, single episode, complete remission ([place]); COPD (chronic obstructive pulmonary disease) ([place]); Restless leg; Chronic tension-type headache, intractable; Lumbosacral Radiculopathy (Right); Peripheral vascular disease ([place]); Hx of long term use of blood thinners (Plavix); Lower extremity pain (Right); Neurogenic pain; History of DVT (deep vein thrombosis); Facet syndrome, lumbar ([place]); Lumbar foraminal stenosis (Right) (Severe at L4-5); Opioid-induced constipation ([place]); Osteoporosis, post-menopausal; Vitamin D deficiency; Protein-calorie malnutrition, severe; Dependence on supplemental oxygen; Edema due to malnutrition [facility]); AAA (abdominal aortic aneurysm) without rupture ([place]); History of repair of hip fracture; Rectal bleeding; Chronic hip pain, right; Chronic pain of both lower extremities; Disorder of skeletal system; Problems influencing health status; Pharmacologic therapy; and Nausea on their problem list. Her primarily concern today is the Leg Pain (bilateral) and Pain (bilateral buttock)  Pain Assessment: Location: Right, Left Buttocks Radiating: bilateral legs Onset: More than a month ago Duration: Chronic pain Quality: Burning, Aching,  Constant, Throbbing(jerking) Severity: 7 /10 (subjective, self-reported pain score)  Note: Reported level is compatible with observation.                          Timing: Constant Modifying factors: medications, icy hot patches BP: (!) 168/74  HR: 66  [name] was last scheduled for an appointment on 82/03/2018 for medication management. During today's appointment we reviewed Tasha Reyes's chronic pain status, as well as her outpatient medication regimen. She admits that she has is having increased pain in her legs. She feels like since her hip fracture tha pain has gotten worse. She admits that she has numbness, tingling and weakness in her legs. She admits that her legs are jumping more. She has RLS. She has been having nausea. She feels like this is related to her pain. She did have some emesis last night. She denies any fever, chills or abdominal pain. She has lost weight. She admits that she was recently seen by her PCP but no blood work was completed and there was no change in treatment. She gets dizzy so she is usings her walker all the time.   The patient  reports that she does not use drugs. Her body mass index is 16.14 kg/m.  Further details on both, my assessment(s), as well as the proposed treatment plan, please see below.  Controlled Substance Pharmacotherapy Assessment REMS (Risk Evaluation and Mitigation Strategy)  Analgesic:Hydrocodone/APAP 5/325 one tablet every 8 hours (15 mg/day) MME/day:15 mg/day.   Hart Rochester, RN  01/20/2018  7:17 PM  Signed Nursing Pain Medication Assessment:  Safety precautions to be maintained throughout the outpatient stay will include: orient to surroundings, keep bed in low position, maintain call bell within reach at all times, provide assistance with transfer out of bed and ambulation.  Medication Inspection Compliance: Pill count conducted under aseptic conditions, in front  of the patient. Neither the pills nor the bottle was removed  from the patient's sight at any time. Once count was completed pills were immediately returned to the patient in their original bottle.  Medication: Hydrocodone/APAP Pill/Patch Count: 15 of 90 pills remain Pill/Patch Appearance: Markings consistent with prescribed medication Bottle Appearance: Standard pharmacy container. Clearly labeled. Filled Date: 06 / 14 / 2019 Last Medication intake:  Today   Pharmacokinetics: Liberation and absorption (onset of action): WNL Distribution (time to peak effect): WNL Metabolism and excretion (duration of action): WNL         Pharmacodynamics: Desired effects: Analgesia: [name] reports >50% benefit. Functional ability: Patient reports that medication allows her to accomplish basic ADLs Clinically meaningful improvement in function (CMIF): Sustained CMIF goals met Perceived effectiveness: Described as relatively effective, allowing for increase in activities of daily living (ADL) Undesirable effects: Side-effects or Adverse reactions: None reported Monitoring: [place] PMP: Online review of the past 51-monthperiod conducted. Compliant with practice rules and regulations Last UDS on record: Summary  Date Value Ref Range Status  10/21/2017 FINAL  Final    Comment:    ==================================================================== TOXASSURE SELECT 13 (MW) ==================================================================== Test                             Result       Flag       Units Drug Present not Declared for Prescription Verification   Hydrocodone                    1014         UNEXPECTED ng/mg creat   Hydromorphone                  211          UNEXPECTED ng/mg creat   Dihydrocodeine                 221          UNEXPECTED ng/mg creat   Norhydrocodone                 1450         UNEXPECTED ng/mg creat    Sources of hydrocodone include scheduled prescription    medications. Hydromorphone, dihydrocodeine and norhydrocodone are     expected metabolites of hydrocodone. Hydromorphone and    dihydrocodeine are also available as scheduled prescription    medications. ==================================================================== Test                      Result    Flag   Units      Ref Range   Creatinine              180              mg/dL      >=20 ==================================================================== Declared Medications:  The flagging and interpretation on this report are based on the  following declared medications.  Unexpected results may arise from  inaccuracies in the declared medications.  **Note: The testing scope of this panel does not include following  reported medications:  Atenolol  Atorvastatin  Cholecalciferol  Clopidogrel  Iron (Ferrous Sulfate)  Methocarbamol  Polyethylene Glycol  Prednisone ==================================================================== For clinical consultation, please call [phone]====    UDS interpretation: Compliant          Medication Assessment Form: Reviewed. Patient indicates being compliant with therapy Treatment compliance:  Compliant Risk Assessment Profile: Aberrant behavior: See prior evaluations. None observed or detected today Comorbid factors increasing risk of overdose: See prior notes. No additional risks detected today Risk of substance use disorder (SUD): Low  ORT Scoring interpretation table:  Score <3 = Low Risk for SUD  Score between 4-7 = Moderate Risk for SUD  Score >8 = High Risk for Opioid Abuse   Risk Mitigation Strategies:  Patient Counseling: Covered Patient-Prescriber Agreement (PPA): Present and active  Notification to other healthcare providers: Done  Pharmacologic Plan: No change in therapy, at this time.             Laboratory Chemistry  Inflammation Markers (CRP: Acute Phase) (ESR: Chronic Phase) Lab Results  Component Value Date    CRP 1.1 (H) 01/08/2016   ESRSEDRATE 3 01/08/2016                         Rheumatology Markers No results found for: RF, ANA, LABURIC, URICUR, LYMEIGGIGMAB, LYMEABIGMQN, HLAB27                      Renal Function Markers Lab Results  Component Value Date   BUN 10 09/07/2017   CREATININE 0.68 09/07/2017   BCR 8 (L) 04/30/2017   GFRAA >60 09/07/2017   GFRNONAA >60 09/07/2017                             Hepatic Function Markers Lab Results  Component Value Date   AST 21 09/06/2017   ALT 10 (L) 09/06/2017   ALBUMIN 4.1 09/06/2017   ALKPHOS 65 09/06/2017                        Electrolytes Lab Results  Component Value Date   NA 136 09/07/2017   K 4.0 09/07/2017   CL 101 09/07/2017   CALCIUM 7.8 (L) 09/07/2017   MG 2.0 01/08/2016                        Neuropathy Markers No results found for: VITAMINB12, FOLATE, HGBA1C, HIV                      Bone Pathology Markers Lab Results  Component Value Date   VD25OH 5.0 (L) 01/08/2016   25OHVITD1 58 04/15/2016   25OHVITD2 32 04/15/2016   25OHVITD3 26 04/15/2016                         Coagulation Parameters Lab Results  Component Value Date   INR 0.97 09/06/2017   LABPROT 12.8 09/06/2017   APTT 110.0 (H) 11/15/2011   PLT 347 09/07/2017                        Cardiovascular Markers Lab Results  Component Value Date   BNP 416.0 (H) 08/19/2016   TROPONINI <0.03 08/19/2016   HGB 9.6 (L) 09/07/2017   HCT 29.3 (L) 09/07/2017                         CA Markers No results found for: CEA, CA125, LABCA2                      Note: Lab results reviewed.  Recent Diagnostic Imaging Results  CT ABDOMEN PELVIS W CONTRAST CLINICAL DATA:  84 year old female with rectal bleeding.  EXAM: CT ABDOMEN AND PELVIS WITH CONTRAST  TECHNIQUE: Multidetector CT imaging of the abdomen and pelvis was performed using the standard protocol following bolus administration of intravenous contrast.  CONTRAST:  64m ISOVUE-300 IOPAMIDOL  (ISOVUE-300) INJECTION 61%  COMPARISON:  Abdominal CT dated 02/20/2013  FINDINGS: Lower chest: Mild emphysematous changes of the visualized lung bases. Coronary vascular calcification noted.  There is no intra-abdominal free air or free fluid.  Hepatobiliary: The liver is unremarkable. No intrahepatic biliary ductal dilatation. High attenuating content within the gallbladder likely represents sludge/stones. Gallbladder polyp or mass is less likely but not excluded. Ultrasound may provide better evaluation. No associated active inflammation.  Pancreas: Unremarkable. No pancreatic ductal dilatation or surrounding inflammatory changes.  Spleen: Normal in size without focal abnormality.  Adrenals/Urinary Tract: The adrenal glands are unremarkable. The kidneys, visualized ureters are unremarkable. The urinary bladder is not well evaluated due to streak artifact caused by right hip arthroplasty.  Stomach/Bowel: Oral contrast opacifies the stomach and multiple loops of small bowel and traverses into the colon. There is no bowel obstruction or active inflammation. There is sigmoid diverticulosis 50 without definite active inflammatory changes. Evaluation of the sigmoid is however limited due to streak artifact caused by right hip arthroplasty. Appendectomy.  Vascular/Lymphatic: There is advanced aortoiliac atherosclerotic disease. Infrarenal abdominal aortic aneurysm status post prior endovascular aorto bi-iliac repair. The infrarenal abdominal aorta measures up to 3.2 cm in maximal diameter similar to prior CT. 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 The aortic portion and the left iliac limb of the graft appeared patent. There is a patent femoral-femoral bypass graft. No periaortic inflammatory changes or extravasation of contrast. Overall the appearance of the aorta and stent graft appear similar to prior CT.  Reproductive: Hysterectomy. Bilateral  adnexal low attenuating/cystic lesions measure up to 5.4 cm, relatively stable or slightly increased compared to the study of 2014 and likely represent a benign or indolent process. Ultrasound may provide better evaluation if clinically indicated.  Other: None  Musculoskeletal: There is a right hip hemiarthroplasty. There is degenerative changes of the spine primarily at L4-L5. No acute osseous pathology.  IMPRESSION: 1. Colonic diverticulosis. No bowel obstruction or active inflammation. 2. High attenuating content within the gallbladder likely represents sludge or small stones. Gallbladder polyp or mass are not excluded. Ultrasound may provide better evaluation. 3. Stable appearance of the abdominal aortic aneurysm and endovascular stent graft repair with chronic occlusion of the right iliac limb of the graft. No evidence of leak. 4. Grossly stable or minimally increased in axial cystic lesions, likely benign or indolent process ultrasound may provide better evaluation.  Electronically Signed   By: AAnner CreteM.D.   On: 09/06/2017 21:59  Complexity Note: Imaging results reviewed. Results shared with Ms. Randleman, using [employer]                         Meds   Current Outpatient Medications:  .  albuterol (PROVENTIL) (2.5 MG/3ML) 0.083% nebulizer solution, USE 1 VIAL VIA NEBULIZER EVERY 4 HOURS AS NEEDED FOR WHEEZING OR SHORTNESS OF BREATH, Disp: 75 mL, Rfl: 0 .  atenolol (TENORMIN) 25 MG tablet, TAKE 1 TABLET(25 MG) BY MOUTH TWICE DAILY, Disp: 60 tablet, Rfl: 5 .  atorvastatin (LIPITOR) 10 MG tablet, TAKE 1 TABLET BY MOUTH EVERY NIGHT AT BEDTIME, Disp: 90 tablet, Rfl: 3 .  Cholecalciferol (  VITAMIN D3) 2000 units capsule, Take 1 capsule (2,000 Units total) by mouth daily., Disp: 30 capsule, Rfl: 12 .  clopidogrel (PLAVIX) 75 MG tablet, Take 1 tablet (75 mg total) by mouth daily. Please resume on Friday 09/12/2017, Disp: 30 tablet, Rfl: 5 .  ferrous sulfate 325 (65  FE) MG tablet, TAKE 1 TABLET(325 MG) BY MOUTH DAILY, Disp: 30 tablet, Rfl: 12 .  [START ON 03/25/2018] HYDROcodone-acetaminophen (NORCO/VICODIN) 5-325 MG tablet, Take 1 tablet by mouth every 8 (eight) hours as needed for severe pain., Disp: 90 tablet, Rfl: 0 .  methocarbamol (ROBAXIN) 750 MG tablet, Take 1 tablet (750 mg total) by mouth 3 (three) times daily as needed for muscle spasms., Disp: 90 tablet, Rfl: 2 .  OXYGEN, Inhale 2 L/min into the lungs. Use via nasal cannula at night and during the day as needed, Disp: , Rfl:  .  polyethylene glycol powder (GLYCOLAX/MIRALAX) powder, MIX AND TAKE 17 GRAMS BY MOUTH prn, Disp: , Rfl: 5 .  predniSONE (DELTASONE) 5 MG tablet, Take 1 tablet by mouth daily., Disp: , Rfl: 5 .  gabapentin (NEURONTIN) 100 MG capsule, Take 1 capsule (100 mg total) by mouth at bedtime. May increase by one caps at night only every 7 days for max of 3 capsules., Disp: 90 capsule, Rfl: 0 .  [START ON 02/23/2018] HYDROcodone-acetaminophen (NORCO/VICODIN) 5-325 MG tablet, Take 1 tablet by mouth every 8 (eight) hours as needed for severe pain., Disp: 90 tablet, Rfl: 0 .  [START ON 01/24/2018] HYDROcodone-acetaminophen (NORCO/VICODIN) 5-325 MG tablet, Take 1 tablet by mouth every 8 (eight) hours as needed for severe pain., Disp: 90 tablet, Rfl: 0 .  ondansetron (ZOFRAN-ODT) 8 MG disintegrating tablet, Take 1 tablet (8 mg total) by mouth 2 (two) times daily for 15 days., Disp: 60 tablet, Rfl: 0  ROS  Constitutional: Denies any fever or chills Gastrointestinal: No reported hemesis, hematochezia, vomiting, or acute GI distress Musculoskeletal: Denies any acute onset joint swelling, redness, loss of ROM, or weakness Neurological: No reported episodes of acute onset apraxia, aphasia, dysarthria, agnosia, amnesia, paralysis, loss of coordination, or loss of consciousness  Allergies  Ms. Foxworth has No Known Allergies.  PFSH  Drug: Ms. Ezelle  reports that she does not use  drugs. Alcohol:  reports that she does not drink alcohol. Tobacco:  reports that she has been smoking cigarettes.  She has a 15.25 pack-year smoking history. She has never used smokeless tobacco. Medical:  has a past medical history of Aneurysm of iliac artery ([place]) (05/11/2015), COPD (chronic obstructive pulmonary disease) ([place]), Cystitis, History of DVT (deep vein thrombosis) (05/11/2015), History of peptic ulcer disease (05/11/2015), [name], Hypertension, Losing weight, Migraines, [name], Rectal bleeding, Stroke ([place]), and Vascular disease. Surgical: Ms. Pidgeon  has a past surgical history that includes Partial hysterectomy; [name]; [name], 2008); Back surgery; Colonoscopy (2012); Esophagogastroduodenoscopy (04/2013); Abdominal hysterectomy; Appendectomy; blood clot ; Hip Arthroplasty (Right, 06/17/2017); and Joint replacement (Right, 06/19/2017). Family: family history includes Brain cancer in her father; Colon cancer in her brother; Kidney disease in her mother.  Constitutional Exam  General appearance: alert, cooperative, in mild distress and pale Vitals:   01/20/18 1337  BP: (!) 168/74  Pulse: 66  Resp: 20  Temp: 98.2 F (36.8 C)  TempSrc: Oral  SpO2: 90%  Weight: 97 lb (44 kg)  Height: '5\' 5"'  (1.651 m)  Psych/Mental status: Alert, oriented x 3 (person, place, & time)  Eyes: PERLA Respiratory: No evidence of acute respiratory distress  Lumbar Spine Area Exam  Skin & Axial Inspection: No masses, redness, or swelling Alignment: Symmetrical Functional ROM: Unrestricted ROM       Stability: No instability detected Muscle Tone/Strength: Functionally intact. No obvious neuro-muscular anomalies detected. Sensory (Neurological): Unimpaired Palpation: No palpable anomalies       Provocative Tests: Lumbar Hyperextension/rotation test: deferred today       Lumbar  quadrant test (Kemp's test): deferred today       Lumbar Lateral bending test: deferred today       Patrick's Maneuver: deferred today                   FABER test: deferred today       Thigh-thrust test: deferred today       S-I compression test: deferred today       S-I distraction test: deferred today        Gait & Posture Assessment  Ambulation: Patient ambulates using a walker Gait: Relatively normal for age and body habitus Posture: WNL   Lower Extremity Exam    Side: Right lower extremity  Side: Left lower extremity  Stability: No instability observed          Stability: No instability observed          Skin & Extremity Inspection: Evidence of prior arthroplastic surgery  Skin & Extremity Inspection: Skin color, temperature, and hair growth are WNL. No peripheral edema or cyanosis. No masses, redness, swelling, asymmetry, or associated skin lesions. No contractures.  Functional ROM: Unrestricted ROM                  Functional ROM: Unrestricted ROM                  Muscle Tone/Strength: Functionally intact. No obvious neuro-muscular anomalies detected.  Muscle Tone/Strength: Functionally intact. No obvious neuro-muscular anomalies detected.  Sensory (Neurological): Unimpaired  Sensory (Neurological): Unimpaired  Palpation: No palpable anomalies  Palpation: No palpable anomalies   Assessment  Primary Diagnosis & Pertinent Problem List: The primary encounter diagnosis was Chronic bilateral low back pain, with sciatica presence unspecified. Diagnoses of Chronic pain of both lower extremities, Nausea, Lumbar foraminal stenosis (Right) (Severe at L4-5), Lower extremity pain (Right), Chronic pain syndrome, Musculoskeletal pain, Disorder of skeletal system, Problems influencing health status, Pharmacologic therapy, Vitamin D deficiency, Protein-calorie malnutrition, severe, and Neurogenic pain were also pertinent to this visit.  Status Diagnosis  Controlled Controlled Controlled 1.  Chronic bilateral low back pain, with sciatica presence unspecified   2. Chronic pain of both lower extremities   3. Nausea   4. Lumbar foraminal stenosis (Right) (Severe at L4-5)   5. Lower extremity pain (Right)   6. Chronic pain syndrome   7. Musculoskeletal pain   8. Disorder of skeletal system   9. Problems influencing health status   10. Pharmacologic therapy   11. Vitamin D deficiency   12. Protein-calorie malnutrition, severe   13. Neurogenic pain     Problems updated and reviewed during this visit: Problem  Chronic Pain of Both Lower Extremities  Disorder of Skeletal System  Problems Influencing Health Status  Pharmacologic Therapy  Nausea   Plan of Care  Pharmacotherapy (Medications Ordered): Meds ordered this encounter  Medications  . gabapentin (NEURONTIN) 100 MG capsule    Sig: Take 1 capsule (100 mg total) by mouth at bedtime. May increase by one caps at night only every 7 days for  max of 3 capsules.    Dispense:  90 capsule    Refill:  0    Order Specific Question:   Supervising Provider    Answer:   Milinda Pointer [phone]  . HYDROcodone-acetaminophen (NORCO/VICODIN) 5-325 MG tablet    Sig: Take 1 tablet by mouth every 8 (eight) hours as needed for severe pain.    Dispense:  90 tablet    Refill:  0    Do not place this medication, or any other prescription from our practice, on "Automatic Refill". Patient may have prescription filled one day early if pharmacy is closed on scheduled refill date. Do not fill until:03/25/2018 To last until: 04/24/2018    Order Specific Question:   Supervising Provider    Answer:   Milinda Pointer [phone]  . methocarbamol (ROBAXIN) 750 MG tablet    Sig: Take 1 tablet (750 mg total) by mouth 3 (three) times daily as needed for muscle spasms.    Dispense:  90 tablet    Refill:  2    Do not add this medication to the electronic "Automatic Refill" notification system. Patient may have prescription filled one day early if  pharmacy is closed on scheduled refill date.    Order Specific Question:   Supervising Provider    Answer:   Milinda Pointer [phone]  . HYDROcodone-acetaminophen (NORCO/VICODIN) 5-325 MG tablet    Sig: Take 1 tablet by mouth every 8 (eight) hours as needed for severe pain.    Dispense:  90 tablet    Refill:  0    Do not place this medication, or any other prescription from our practice, on "Automatic Refill". Patient may have prescription filled one day early if pharmacy is closed on scheduled refill date. Do not fill until:02/23/2018 To last until: 03/25/2018    Order Specific Question:   Supervising Provider    Answer:   Milinda Pointer [phone]  . HYDROcodone-acetaminophen (NORCO/VICODIN) 5-325 MG tablet    Sig: Take 1 tablet by mouth every 8 (eight) hours as needed for severe pain.    Dispense:  90 tablet    Refill:  0    Do not place this medication, or any other prescription from our practice, on "Automatic Refill". Patient may have prescription filled one day early if pharmacy is closed on scheduled refill date. Do not fill until:01/24/2018 To last until: 02/23/2018    Order Specific Question:   Supervising Provider    Answer:   Milinda Pointer [phone]  . ondansetron (ZOFRAN-ODT) 8 MG disintegrating tablet    Sig: Take 1 tablet (8 mg total) by mouth 2 (two) times daily for 15 days.    Dispense:  60 tablet    Refill:  0    Do not add to the electronic "Automatic Refill" notification system. Patient may have prescription filled one day early if pharmacy is closed on scheduled refill date.    Order Specific Question:   Supervising Provider    Answer:   Milinda Pointer [phone]   New Prescriptions   GABAPENTIN (NEURONTIN) 100 MG CAPSULE    Take 1 capsule (100 mg total) by mouth at bedtime. May increase by one caps at night only every 7 days for max of 3 capsules.   ONDANSETRON (ZOFRAN-ODT) 8 MG DISINTEGRATING TABLET    Take 1 tablet (8 mg total) by mouth 2 (two) times  daily for 15 days.   Medications administered today: Monique F. Buccheri had no medications administered during this visit. Lab-work, procedure(s), and/or referral(s): Orders  Placed This Encounter  Procedures  . Lumbar Epidural Injection  . Comp. Metabolic Panel (12)  . Magnesium  . Vitamin B12  . Sedimentation rate  . 25-Hydroxyvitamin D Lcms D2+D3  . C-reactive protein   Imaging and/or referral(s): None Interventional therapies: Planned, scheduled, and/or pending:  Diagnostic bilateral lumbar epidural steroid injection    Considering:  Diagnostic right L4-5 transforaminal epidural steroid injection  Diagnostic right L4-5 lumbar epidural steroid injection  Diagnostic bilateral lumbar facet block  Possible bilateral lumbar facet radiofrequency ablation    Palliative PRN treatment(s):  Diagnostic right L4-5 transforaminal epidural steroid injection Diagnostic right L4-5 lumbar epidural steroid injection  Diagnostic bilateral lumbar facet block   Provider-requested follow-up: Return in about 3 months (around 04/22/2018) for MedMgmt with Me Dionisio David), in addition, w/ Dr. Dossie Arbour, Procedure(w/Sedation).  Future Appointments  Date Time Provider [place]  02/03/2018 12:45 PM Milinda Pointer, MD [facility] None  03/11/2018  1:30 PM [place] ADVISOR [facility] None  03/20/2018 10:00 AM [facility] None  03/20/2018 10:30 AM [facility] None  03/20/2018 11:30 AM Dew, Erskine Squibb, MD [facility] None  03/23/2018  2:45 PM Glean Hess, MD [facility] None  04/22/2018  1:30 PM Vevelyn Francois, NP [facility] None   Primary Care Physician: Glean Hess, MD Location: [facility] Note by: Vevelyn Francois NP Date: 01/20/2018; Time: 7:18 PM  Pain Score Disclaimer: We use the NRS-11 scale. This is a self-reported, subjective measurement of pain severity with only modest accuracy. It is used primarily to identify changes within  a particular patient. It must be understood that outpatient pain scales are significantly less accurate that those used for research, where they can be applied under ideal controlled circumstances with minimal exposure to variables. In reality, the score is likely to be a combination of pain intensity and pain affect, where pain affect describes the degree of emotional arousal or changes in action readiness caused by the sensory experience of pain. Factors such as social and work situation, setting, emotional state, anxiety levels, expectation, and prior pain experience may influence pain perception and show large inter-individual differences that may also be affected by time variables.  Patient instructions provided during this appointment: Patient Instructions   ____________________________________________________________________________________________  Medication Rules  Applies to: All patients receiving prescriptions (written or electronic).  Pharmacy of record: Pharmacy where electronic prescriptions will be sent. If written prescriptions are taken to a different pharmacy, please inform the nursing staff. The pharmacy listed in the electronic medical record should be the one where you would like electronic prescriptions to be sent.  Prescription refills: Only during scheduled appointments. Applies to both, written and electronic prescriptions.  NOTE: The following applies primarily to controlled substances (Opioid* Pain Medications).   Patient's responsibilities: 1. Pain Pills: Bring all pain pills to every appointment (except for procedure appointments). 2. Pill Bottles: Bring pills in original pharmacy bottle. Always bring newest bottle. Bring bottle, even if empty. 3. Medication refills: You are responsible for knowing and keeping track of what medications you need refilled. The day before your appointment, write a list of all prescriptions that need to be refilled. Bring that list to  your appointment and give it to the admitting nurse. Prescriptions will be written only during appointments. If you forget a medication, it will not be "Called in", "Faxed", or "electronically sent". You will need to get another appointment to get these prescribed. 4. Prescription Accuracy: You are responsible for  carefully inspecting your prescriptions before leaving our office. Have the discharge nurse carefully go over each prescription with you, before taking them home. Make sure that your name is accurately spelled, that your address is correct. Check the name and dose of your medication to make sure it is accurate. Check the number of pills, and the written instructions to make sure they are clear and accurate. Make sure that you are given enough medication to last until your next medication refill appointment. 5. Taking Medication: Take medication as prescribed. Never take more pills than instructed. Never take medication more frequently than prescribed. Taking less pills or less frequently is permitted and encouraged, when it comes to controlled substances (written prescriptions).  6. Inform other Doctors: Always inform, all of your healthcare providers, of all the medications you take. 7. Pain Medication from other Providers: You are not allowed to accept any additional pain medication from any other Doctor or Healthcare provider. There are two exceptions to this rule. (see below) In the event that you require additional pain medication, you are responsible for notifying us, as stated below. 8. Medication Agreement: You are responsible for carefully reading and following our Medication Agreement. This must be signed before receiving any prescriptions from our practice. Safely store a copy of your signed Agreement. Violations to the Agreement will result in no further prescriptions. (Additional copies of our Medication Agreement are available upon request.) 9. Laws, Rules, & Regulations: All patients  are expected to follow all Federal and [employer], Rules, [employer]. Ignorance of the Laws does not constitute a valid excuse. The use of any illegal substances is prohibited. 10. Adopted CDC guidelines & recommendations: Target dosing levels will be at or below 60 MME/day. Use of benzodiazepines** is not recommended.  Exceptions: There are only two exceptions to the rule of not receiving pain medications from other Healthcare Providers. 1. Exception #1 (Emergencies): In the event of an emergency (i.e.: accident requiring emergency care), you are allowed to receive additional pain medication. However, you are responsible for: As soon as you are able, call our office [phone], at any time of the day or night, and leave a message stating your name, the date and nature of the emergency, and the name and dose of the medication prescribed. In the event that your call is answered by a member of our staff, make sure to document and save the date, time, and the name of the person that took your information.  2. Exception #2 (Planned Surgery): In the event that you are scheduled by another doctor or dentist to have any type of surgery or procedure, you are allowed (for a period no longer than 30 days), to receive additional pain medication, for the acute post-op pain. However, in this case, you are responsible for picking up a copy of our "Post-op Pain Management for Surgeons" handout, and giving it to your surgeon or dentist. This document is available at our office, and does not require an appointment to obtain it. Simply go to our office during business hours (Monday-Thursday from 8:00 AM to 4:00 PM) (Friday 8:00 AM to 12:00 Noon) or if you have a scheduled appointment with [place], prior to your surgery, and ask for it by name. In addition, you will need to provide [place] with your name, name of your surgeon, type of surgery, and date of procedure or surgery.  *Opioid medications include: morphine,  codeine, oxycodone, oxymorphone, hydrocodone, hydromorphone, meperidine, tramadol, tapentadol, buprenorphine, fentanyl, methadone. **Benzodiazepine medications include:  diazepam (Valium), alprazolam (Xanax), clonazepam (Klonopine), lorazepam (Ativan), clorazepate (Tranxene), chlordiazepoxide (Librium), estazolam (Prosom), oxazepam (Serax), temazepam (Restoril), triazolam (Halcion) (Last updated: 09/11/2017) ____________________________________________________________________________________________   Pain Management Discharge Instructions  General Discharge Instructions :  If you need to reach your doctor call: Monday-Friday 8:00 am - 4:00 pm at [phone] or toll free [phone].  After clinic hours [phone] to have operator reach doctor.  Bring all of your medication bottles to all your appointments in the pain clinic.  To cancel or reschedule your appointment with Pain Management please remember to call 24 hours in advance to avoid a fee.  Refer to the educational materials which you have been given on: General Risks, I had my Procedure. Discharge Instructions, Post Sedation.  Post Procedure Instructions:  The drugs you were given will stay in your system until tomorrow, so for the next 24 hours you should not drive, make any legal decisions or drink any alcoholic beverages.  You may eat anything you prefer, but it is better to start with liquids then soups and crackers, and gradually work up to solid foods.  Please notify your doctor immediately if you have any unusual bleeding, trouble breathing or pain that is not related to your normal pain.  Depending on the type of procedure that was done, some parts of your body may feel week and/or numb.  This usually clears up by tonight or the next day.  Walk with the use of an assistive device or accompanied by an adult for the 24 hours.  You may use ice on the affected area for the first 24 hours.  Put ice in a Ziploc bag and  cover with a towel and place against area 15 minutes on 15 minutes off.  You may switch to heat after 24 hours.GENERAL RISKS AND COMPLICATIONS  What are the risk, side effects and possible complications? Generally speaking, most procedures are safe.  However, with any procedure there are risks, side effects, and the possibility of complications.  The risks and complications are dependent upon the sites that are lesioned, or the type of nerve block to be performed.  The closer the procedure is to the spine, the more serious the risks are.  Great care is taken when placing the radio frequency needles, block needles or lesioning probes, but sometimes complications can occur. 1. Infection: Any time there is an injection through the skin, there is a risk of infection.  This is why sterile conditions are used for these blocks.  There are four possible types of infection. 1. Localized skin infection. 2. Central Nervous System Infection-This can be in the form of Meningitis, which can be deadly. 3. Epidural Infections-This can be in the form of an epidural abscess, which can cause pressure inside of the spine, causing compression of the spinal cord with subsequent paralysis. This would require an emergency surgery to decompress, and there are no guarantees that the patient would recover from the paralysis. 4. Discitis-This is an infection of the intervertebral discs.  It occurs in about 1% of discography procedures.  It is difficult to treat and it may lead to surgery.        2. Pain: the needles have to go through skin and soft tissues, will cause soreness.       3. Damage to internal structures:  The nerves to be lesioned may be near blood vessels or    other nerves which can be potentially damaged.       4. Bleeding: Bleeding is more  common if the patient is taking blood thinners such as  aspirin, Coumadin, Ticiid, Plavix, etc., or if he/she have some genetic predisposition  such as hemophilia. Bleeding into  the spinal canal can cause compression of the spinal  cord with subsequent paralysis.  This would require an emergency surgery to  decompress and there are no guarantees that the patient would recover from the  paralysis.       5. Pneumothorax:  Puncturing of a lung is a possibility, every time a needle is introduced in  the area of the chest or upper back.  Pneumothorax refers to free air around the  collapsed lung(s), inside of the thoracic cavity (chest cavity).  Another two possible  complications related to a similar event would include: Hemothorax and Chylothorax.   These are variations of the Pneumothorax, where instead of air around the collapsed  lung(s), you may have blood or chyle, respectively.       6. Spinal headaches: They may occur with any procedures in the area of the spine.       7. Persistent CSF (Cerebro-Spinal Fluid) leakage: This is a rare problem, but may occur  with prolonged intrathecal or epidural catheters either due to the formation of a fistulous  track or a dural tear.       8. Nerve damage: By working so close to the spinal cord, there is always a possibility of  nerve damage, which could be as serious as a permanent spinal cord injury with  paralysis.       9. Death:  Although rare, severe deadly allergic reactions known as "Anaphylactic  reaction" can occur to any of the medications used.      10. Worsening of the symptoms:  We can always make thing worse.  What are the chances of something like this happening? Chances of any of this occuring are extremely low.  By statistics, you have more of a chance of getting killed in a motor vehicle accident: while driving to the hospital than any of the above occurring .  Nevertheless, you should be aware that they are possibilities.  In general, it is similar to taking a shower.  Everybody knows that you can slip, hit your head and get killed.  Does that mean that you should not shower again?  Nevertheless always keep in mind that  statistics do not mean anything if you happen to be on the wrong side of them.  Even if a procedure has a 1 (one) in a 1,000,000 (million) chance of going wrong, it you happen to be that one..Also, keep in mind that by statistics, you have more of a chance of having something go wrong when taking medications.  Who should not have this procedure? If you are on a blood thinning medication (e.g. Coumadin, Plavix, see list of "Blood Thinners"), or if you have an active infection going on, you should not have the procedure.  If you are taking any blood thinners, please inform your physician.  How should I prepare for this procedure?  Do not eat or drink anything at least six hours prior to the procedure.  Bring a driver with you .  It cannot be a taxi.  Come accompanied by an adult that can drive you back, and that is strong enough to help you if your legs get weak or numb from the local anesthetic.  Take all of your medicines the morning of the procedure with just enough water to swallow them.  If  you have diabetes, make sure that you are scheduled to have your procedure done first thing in the morning, whenever possible.  If you have diabetes, take only half of your insulin dose and notify our nurse that you have done so as soon as you arrive at the clinic.  If you are diabetic, but only take blood sugar pills (oral hypoglycemic), then do not take them on the morning of your procedure.  You may take them after you have had the procedure.  Do not take aspirin or any aspirin-containing medications, at least eleven (11) days prior to the procedure.  They may prolong bleeding.  Wear loose fitting clothing that may be easy to take off and that you would not mind if it got stained with Betadine or blood.  Do not wear any jewelry or perfume  Remove any nail coloring.  It will interfere with some of our monitoring equipment.  NOTE: Remember that this is not meant to be interpreted as a complete  list of all possible complications.  Unforeseen problems may occur.  BLOOD THINNERS The following drugs contain aspirin or other products, which can cause increased bleeding during surgery and should not be taken for 2 weeks prior to and 1 week after surgery.  If you should need take something for relief of minor pain, you may take acetaminophen which is found in Tylenol,m Datril, Anacin-3 and Panadol. It is not blood thinner. The products listed below are.  Do not take any of the products listed below in addition to any listed on your instruction sheet.  A.P.C or A.P.C with Codeine Codeine Phosphate Capsules #3 Ibuprofen Ridaura  ABC compound Congesprin Imuran rimadil  Advil Cope Indocin Robaxisal  Alka-Seltzer Effervescent Pain Reliever and Antacid Coricidin or Coricidin-D  Indomethacin Rufen  Alka-Seltzer plus Cold Medicine Cosprin Ketoprofen S-A-C Tablets  Anacin Analgesic Tablets or Capsules Coumadin Korlgesic Salflex  Anacin Extra Strength Analgesic tablets or capsules CP-2 Tablets Lanoril Salicylate  Anaprox Cuprimine Capsules Levenox Salocol  Anexsia-D Dalteparin Magan Salsalate  Anodynos Darvon compound Magnesium Salicylate Sine-off  Ansaid Dasin Capsules Magsal Sodium Salicylate  Anturane Depen Capsules Marnal Soma  APF Arthritis pain formula Dewitt's Pills Measurin Stanback  Argesic Dia-Gesic Meclofenamic Sulfinpyrazone  Arthritis Bayer Timed Release Aspirin Diclofenac Meclomen Sulindac  Arthritis pain formula Anacin Dicumarol Medipren Supac  Analgesic (Safety coated) Arthralgen Diffunasal Mefanamic Suprofen  Arthritis Strength Bufferin Dihydrocodeine Mepro Compound Suprol  Arthropan liquid Dopirydamole Methcarbomol with Aspirin Synalgos  ASA tablets/Enseals Disalcid Micrainin Tagament  Ascriptin Doan's Midol Talwin  Ascriptin A/D Dolene Mobidin Tanderil  Ascriptin Extra Strength Dolobid Moblgesic Ticlid  Ascriptin with Codeine Doloprin or Doloprin with Codeine Momentum  Tolectin  Asperbuf Duoprin Mono-gesic Trendar  Aspergum Duradyne Motrin or Motrin IB Triminicin  Aspirin plain, buffered or enteric coated Durasal Myochrisine Trigesic  Aspirin Suppositories Easprin Nalfon Trillsate  Aspirin with Codeine Ecotrin Regular or Extra Strength Naprosyn Uracel  Atromid-S Efficin Naproxen Ursinus  Auranofin Capsules Elmiron Neocylate Vanquish  Axotal Emagrin Norgesic Verin  Azathioprine Empirin or Empirin with Codeine Normiflo Vitamin E  Azolid Emprazil Nuprin Voltaren  Bayer Aspirin plain, buffered or children's or timed BC Tablets or powders Encaprin Orgaran Warfarin Sodium  Buff-a-Comp Enoxaparin Orudis Zorpin  Buff-a-Comp with Codeine Equegesic Os-Cal-Gesic   Buffaprin Excedrin plain, buffered or Extra Strength Oxalid   Bufferin Arthritis Strength Feldene Oxphenbutazone   Bufferin plain or Extra Strength Feldene Capsules Oxycodone with Aspirin   Bufferin with Codeine Fenoprofen Fenoprofen Pabalate or Pabalate-SF   Buffets II Flogesic  Panagesic   Buffinol plain or Extra Strength Florinal or Florinal with Codeine Panwarfarin   Buf-Tabs Flurbiprofen Penicillamine   Butalbital Compound Four-way cold tablets Penicillin   Butazolidin Fragmin Pepto-Bismol   Carbenicillin Geminisyn Percodan   Carna Arthritis Reliever Geopen Persantine   Carprofen Gold's salt Persistin   Chloramphenicol Goody's Phenylbutazone   Chloromycetin Haltrain Piroxlcam   Clmetidine heparin Plaquenil   Cllnoril Hyco-pap Ponstel   Clofibrate Hydroxy chloroquine Propoxyphen         Before stopping any of these medications, be sure to consult the physician who ordered them.  Some, such as Coumadin (Warfarin) are ordered to prevent or treat serious conditions such as "deep thrombosis", "pumonary embolisms", and other heart problems.  The amount of time that you may need off of the medication may also vary with the medication and the reason for which you were taking it.  If you are taking any  of these medications, please make sure you notify your pain physician before you undergo any procedures.         Epidural Steroid Injection Patient Information  Description: The epidural space surrounds the nerves as they exit the spinal cord.  In some patients, the nerves can be compressed and inflamed by a bulging disc or a tight spinal canal (spinal stenosis).  By injecting steroids into the epidural space, we can bring irritated nerves into direct contact with a potentially helpful medication.  These steroids act directly on the irritated nerves and can reduce swelling and inflammation which often leads to decreased pain.  Epidural steroids may be injected anywhere along the spine and from the neck to the low back depending upon the location of your pain.   After numbing the skin with local anesthetic (like Novocaine), a small needle is passed into the epidural space slowly.  You may experience a sensation of pressure while this is being done.  The entire block usually last less than 10 minutes.  Conditions which may be treated by epidural steroids:   Low back and leg pain  Neck and arm pain  Spinal stenosis  Post-laminectomy syndrome  Herpes zoster (shingles) pain  Pain from compression fractures  Preparation for the injection:  3. Do not eat any solid food or dairy products within 8 hours of your appointment.  4. You may drink clear liquids up to 3 hours before appointment.  Clear liquids include water, black coffee, juice or soda.  No milk or cream please. 5. You may take your regular medication, including pain medications, with a sip of water before your appointment  Diabetics should hold regular insulin (if taken separately) and take 1/2 normal NPH dos the morning of the procedure.  Carry some sugar containing items with you to your appointment. 6. A driver must accompany you and be prepared to drive you home after your procedure.  7. Bring all your current medications  with your. 8. An IV may be inserted and sedation may be given at the discretion of the physician.   9. A blood pressure cuff, EKG and other monitors will often be applied during the procedure.  Some patients may need to have extra oxygen administered for a short period. 10. You will be asked to provide medical information, including your allergies, prior to the procedure.  We must know immediately if you are taking blood thinners (like Coumadin/Warfarin)  Or if you are allergic to IV iodine contrast (dye). We must know if you could possible be pregnant.  Possible side-effects:  Bleeding  from needle site  Infection (rare, may require surgery)  Nerve injury (rare)  Numbness & tingling (temporary)  Difficulty urinating (rare, temporary)  Spinal headache ( a headache worse with upright posture)  Light -headedness (temporary)  Pain at injection site (several days)  Decreased blood pressure (temporary)  Weakness in arm/leg (temporary)  Pressure sensation in back/neck (temporary)  Call if you experience:  Fever/chills associated with headache or increased back/neck pain.  Headache worsened by an upright position.  New onset weakness or numbness of an extremity below the injection site  Hives or difficulty breathing (go to the emergency room)  Inflammation or drainage at the infection site  Severe back/neck pain  Any new symptoms which are concerning to you  Please note:  Although the local anesthetic injected can often make your back or neck feel good for several hours after the injection, the pain will likely return.  It takes 3-7 days for steroids to work in the epidural space.  You may not notice any pain relief for at least that one week.  If effective, we will often do a series of three injections spaced 3-6 weeks apart to maximally decrease your pain.  After the initial series, we generally will wait several months before considering a repeat injection of the same  type.  If you have any questions, please call [phone] [facility]

## 2018-01-20 NOTE — Patient Instructions (Addendum)
____________________________________________________________________________________________  Medication Rules  Applies to: All patients receiving prescriptions (written or electronic).  Pharmacy of record: Pharmacy where electronic prescriptions will be sent. If written prescriptions are taken to a different pharmacy, please inform the nursing staff. The pharmacy listed in the electronic medical record should be the one where you would like electronic prescriptions to be sent.  Prescription refills: Only during scheduled appointments. Applies to both, written and electronic prescriptions.  NOTE: The following applies primarily to controlled substances (Opioid* Pain Medications).   Patient's responsibilities: 1. Pain Pills: Bring all pain pills to every appointment (except for procedure appointments). 2. Pill Bottles: Bring pills in original pharmacy bottle. Always bring newest bottle. Bring bottle, even if empty. 3. Medication refills: You are responsible for knowing and keeping track of what medications you need refilled. The day before your appointment, write a list of all prescriptions that need to be refilled. Bring that list to your appointment and give it to the admitting nurse. Prescriptions will be written only during appointments. If you forget a medication, it will not be "Called in", "Faxed", or "electronically sent". You will need to get another appointment to get these prescribed. 4. Prescription Accuracy: You are responsible for carefully inspecting your prescriptions before leaving our office. Have the discharge nurse carefully go over each prescription with you, before taking them home. Make sure that your name is accurately spelled, that your address is correct. Check the name and dose of your medication to make sure it is accurate. Check the number of pills, and the written instructions to make sure they are clear and accurate. Make sure that you are given enough medication to last  until your next medication refill appointment. 5. Taking Medication: Take medication as prescribed. Never take more pills than instructed. Never take medication more frequently than prescribed. Taking less pills or less frequently is permitted and encouraged, when it comes to controlled substances (written prescriptions).  6. Inform other Doctors: Always inform, all of your healthcare providers, of all the medications you take. 7. Pain Medication from other Providers: You are not allowed to accept any additional pain medication from any other Doctor or Healthcare provider. There are two exceptions to this rule. (see below) In the event that you require additional pain medication, you are responsible for notifying us, as stated below. 8. Medication Agreement: You are responsible for carefully reading and following our Medication Agreement. This must be signed before receiving any prescriptions from our practice. Safely store a copy of your signed Agreement. Violations to the Agreement will result in no further prescriptions. (Additional copies of our Medication Agreement are available upon request.) 9. Laws, Rules, & Regulations: All patients are expected to follow all Federal and State Laws, Statutes, Rules, & Regulations. Ignorance of the Laws does not constitute a valid excuse. The use of any illegal substances is prohibited. 10. Adopted CDC guidelines & recommendations: Target dosing levels will be at or below 60 MME/day. Use of benzodiazepines** is not recommended.  Exceptions: There are only two exceptions to the rule of not receiving pain medications from other Healthcare Providers. 1. Exception #1 (Emergencies): In the event of an emergency (i.e.: accident requiring emergency care), you are allowed to receive additional pain medication. However, you are responsible for: As soon as you are able, call our office [phone], at any time of the day or night, and leave a message stating your name, the  date and nature of the emergency, and the name and dose of the medication   prescribed. In the event that your call is answered by a member of our staff, make sure to document and save the date, time, and the name of the person that took your information.  2. Exception #2 (Planned Surgery): In the event that you are scheduled by another doctor or dentist to have any type of surgery or procedure, you are allowed (for a period no longer than 30 days), to receive additional pain medication, for the acute post-op pain. However, in this case, you are responsible for picking up a copy of our "Post-op Pain Management for Surgeons" handout, and giving it to your surgeon or dentist. This document is available at our office, and does not require an appointment to obtain it. Simply go to our office during business hours (Monday-Thursday from 8:00 AM to 4:00 PM) (Friday 8:00 AM to 12:00 Noon) or if you have a scheduled appointment with [place], prior to your surgery, and ask for it by name. In addition, you will need to provide [place] with your name, name of your surgeon, type of surgery, and date of procedure or surgery.  *Opioid medications include: morphine, codeine, oxycodone, oxymorphone, hydrocodone, hydromorphone, meperidine, tramadol, tapentadol, buprenorphine, fentanyl, methadone. **Benzodiazepine medications include: diazepam (Valium), alprazolam (Xanax), clonazepam (Klonopine), lorazepam (Ativan), clorazepate (Tranxene), chlordiazepoxide (Librium), estazolam (Prosom), oxazepam (Serax), temazepam (Restoril), triazolam (Halcion) (Last updated: 09/11/2017) ____________________________________________________________________________________________   Pain Management Discharge Instructions  General Discharge Instructions :  If you need to reach your doctor call: Monday-Friday 8:00 am - 4:00 pm at [phone] or toll free [phone].  After clinic hours [phone] to have operator reach doctor.  Bring all of  your medication bottles to all your appointments in the pain clinic.  To cancel or reschedule your appointment with Pain Management please remember to call 24 hours in advance to avoid a fee.  Refer to the educational materials which you have been given on: General Risks, I had my Procedure. Discharge Instructions, Post Sedation.  Post Procedure Instructions:  The drugs you were given will stay in your system until tomorrow, so for the next 24 hours you should not drive, make any legal decisions or drink any alcoholic beverages.  You may eat anything you prefer, but it is better to start with liquids then soups and crackers, and gradually work up to solid foods.  Please notify your doctor immediately if you have any unusual bleeding, trouble breathing or pain that is not related to your normal pain.  Depending on the type of procedure that was done, some parts of your body may feel week and/or numb.  This usually clears up by tonight or the next day.  Walk with the use of an assistive device or accompanied by an adult for the 24 hours.  You may use ice on the affected area for the first 24 hours.  Put ice in a Ziploc bag and cover with a towel and place against area 15 minutes on 15 minutes off.  You may switch to heat after 24 hours.GENERAL RISKS AND COMPLICATIONS  What are the risk, side effects and possible complications? Generally speaking, most procedures are safe.  However, with any procedure there are risks, side effects, and the possibility of complications.  The risks and complications are dependent upon the sites that are lesioned, or the type of nerve block to be performed.  The closer the procedure is to the spine, the more serious the risks are.  Great care is taken when placing the radio frequency needles, block needles or lesioning  probes, but sometimes complications can occur. 1. Infection: Any time there is an injection through the skin, there is a risk of infection.  This is  why sterile conditions are used for these blocks.  There are four possible types of infection. 1. Localized skin infection. 2. Central Nervous System Infection-This can be in the form of Meningitis, which can be deadly. 3. Epidural Infections-This can be in the form of an epidural abscess, which can cause pressure inside of the spine, causing compression of the spinal cord with subsequent paralysis. This would require an emergency surgery to decompress, and there are no guarantees that the patient would recover from the paralysis. 4. Discitis-This is an infection of the intervertebral discs.  It occurs in about 1% of discography procedures.  It is difficult to treat and it may lead to surgery.        2. Pain: the needles have to go through skin and soft tissues, will cause soreness.       3. Damage to internal structures:  The nerves to be lesioned may be near blood vessels or    other nerves which can be potentially damaged.       4. Bleeding: Bleeding is more common if the patient is taking blood thinners such as  aspirin, Coumadin, Ticiid, Plavix, etc., or if he/she have some genetic predisposition  such as hemophilia. Bleeding into the spinal canal can cause compression of the spinal  cord with subsequent paralysis.  This would require an emergency surgery to  decompress and there are no guarantees that the patient would recover from the  paralysis.       5. Pneumothorax:  Puncturing of a lung is a possibility, every time a needle is introduced in  the area of the chest or upper back.  Pneumothorax refers to free air around the  collapsed lung(s), inside of the thoracic cavity (chest cavity).  Another two possible  complications related to a similar event would include: Hemothorax and Chylothorax.   These are variations of the Pneumothorax, where instead of air around the collapsed  lung(s), you may have blood or chyle, respectively.       6. Spinal headaches: They may occur with any procedures in the  area of the spine.       7. Persistent CSF (Cerebro-Spinal Fluid) leakage: This is a rare problem, but may occur  with prolonged intrathecal or epidural catheters either due to the formation of a fistulous  track or a dural tear.       8. Nerve damage: By working so close to the spinal cord, there is always a possibility of  nerve damage, which could be as serious as a permanent spinal cord injury with  paralysis.       9. Death:  Although rare, severe deadly allergic reactions known as "Anaphylactic  reaction" can occur to any of the medications used.      10. Worsening of the symptoms:  We can always make thing worse.  What are the chances of something like this happening? Chances of any of this occuring are extremely low.  By statistics, you have more of a chance of getting killed in a motor vehicle accident: while driving to the hospital than any of the above occurring .  Nevertheless, you should be aware that they are possibilities.  In general, it is similar to taking a shower.  Everybody knows that you can slip, hit your head and get killed.  Does that mean that  you should not shower again?  Nevertheless always keep in mind that statistics do not mean anything if you happen to be on the wrong side of them.  Even if a procedure has a 1 (one) in a 1,000,000 (million) chance of going wrong, it you happen to be that one..Also, keep in mind that by statistics, you have more of a chance of having something go wrong when taking medications.  Who should not have this procedure? If you are on a blood thinning medication (e.g. Coumadin, Plavix, see list of "Blood Thinners"), or if you have an active infection going on, you should not have the procedure.  If you are taking any blood thinners, please inform your physician.  How should I prepare for this procedure?  Do not eat or drink anything at least six hours prior to the procedure.  Bring a driver with you .  It cannot be a taxi.  Come accompanied  by an adult that can drive you back, and that is strong enough to help you if your legs get weak or numb from the local anesthetic.  Take all of your medicines the morning of the procedure with just enough water to swallow them.  If you have diabetes, make sure that you are scheduled to have your procedure done first thing in the morning, whenever possible.  If you have diabetes, take only half of your insulin dose and notify our nurse that you have done so as soon as you arrive at the clinic.  If you are diabetic, but only take blood sugar pills (oral hypoglycemic), then do not take them on the morning of your procedure.  You may take them after you have had the procedure.  Do not take aspirin or any aspirin-containing medications, at least eleven (11) days prior to the procedure.  They may prolong bleeding.  Wear loose fitting clothing that may be easy to take off and that you would not mind if it got stained with Betadine or blood.  Do not wear any jewelry or perfume  Remove any nail coloring.  It will interfere with some of our monitoring equipment.  NOTE: Remember that this is not meant to be interpreted as a complete list of all possible complications.  Unforeseen problems may occur.  BLOOD THINNERS The following drugs contain aspirin or other products, which can cause increased bleeding during surgery and should not be taken for 2 weeks prior to and 1 week after surgery.  If you should need take something for relief of minor pain, you may take acetaminophen which is found in Tylenol,m Datril, Anacin-3 and Panadol. It is not blood thinner. The products listed below are.  Do not take any of the products listed below in addition to any listed on your instruction sheet.  A.P.C or A.P.C with Codeine Codeine Phosphate Capsules #3 Ibuprofen Ridaura  ABC compound Congesprin Imuran rimadil  Advil Cope Indocin Robaxisal  Alka-Seltzer Effervescent Pain Reliever and Antacid Coricidin or  Coricidin-D  Indomethacin Rufen  Alka-Seltzer plus Cold Medicine Cosprin Ketoprofen S-A-C Tablets  Anacin Analgesic Tablets or Capsules Coumadin Korlgesic Salflex  Anacin Extra Strength Analgesic tablets or capsules CP-2 Tablets Lanoril Salicylate  Anaprox Cuprimine Capsules Levenox Salocol  Anexsia-D Dalteparin Magan Salsalate  Anodynos Darvon compound Magnesium Salicylate Sine-off  Ansaid Dasin Capsules Magsal Sodium Salicylate  Anturane Depen Capsules Marnal Soma  APF Arthritis pain formula Dewitt's Pills Measurin Stanback  Argesic Dia-Gesic Meclofenamic Sulfinpyrazone  Arthritis Bayer Timed Release Aspirin Diclofenac Meclomen Sulindac  Arthritis  pain formula Anacin Dicumarol Medipren Supac  Analgesic (Safety coated) Arthralgen Diffunasal Mefanamic Suprofen  Arthritis Strength Bufferin Dihydrocodeine Mepro Compound Suprol  Arthropan liquid Dopirydamole Methcarbomol with Aspirin Synalgos  ASA tablets/Enseals Disalcid Micrainin Tagament  Ascriptin Doan's Midol Talwin  Ascriptin A/D Dolene Mobidin Tanderil  Ascriptin Extra Strength Dolobid Moblgesic Ticlid  Ascriptin with Codeine Doloprin or Doloprin with Codeine Momentum Tolectin  Asperbuf Duoprin Mono-gesic Trendar  Aspergum Duradyne Motrin or Motrin IB Triminicin  Aspirin plain, buffered or enteric coated Durasal Myochrisine Trigesic  Aspirin Suppositories Easprin Nalfon Trillsate  Aspirin with Codeine Ecotrin Regular or Extra Strength Naprosyn Uracel  Atromid-S Efficin Naproxen Ursinus  Auranofin Capsules Elmiron Neocylate Vanquish  Axotal Emagrin Norgesic Verin  Azathioprine Empirin or Empirin with Codeine Normiflo Vitamin E  Azolid Emprazil Nuprin Voltaren  Bayer Aspirin plain, buffered or children's or timed BC Tablets or powders Encaprin Orgaran Warfarin Sodium  Buff-a-Comp Enoxaparin Orudis Zorpin  Buff-a-Comp with Codeine Equegesic Os-Cal-Gesic   Buffaprin Excedrin plain, buffered or Extra Strength Oxalid   Bufferin  Arthritis Strength Feldene Oxphenbutazone   Bufferin plain or Extra Strength Feldene Capsules Oxycodone with Aspirin   Bufferin with Codeine Fenoprofen Fenoprofen Pabalate or Pabalate-SF   Buffets II Flogesic Panagesic   Buffinol plain or Extra Strength Florinal or Florinal with Codeine Panwarfarin   Buf-Tabs Flurbiprofen Penicillamine   Butalbital Compound Four-way cold tablets Penicillin   Butazolidin Fragmin Pepto-Bismol   Carbenicillin Geminisyn Percodan   Carna Arthritis Reliever Geopen Persantine   Carprofen Gold's salt Persistin   Chloramphenicol Goody's Phenylbutazone   Chloromycetin Haltrain Piroxlcam   Clmetidine heparin Plaquenil   Cllnoril Hyco-pap Ponstel   Clofibrate Hydroxy chloroquine Propoxyphen         Before stopping any of these medications, be sure to consult the physician who ordered them.  Some, such as Coumadin (Warfarin) are ordered to prevent or treat serious conditions such as "deep thrombosis", "pumonary embolisms", and other heart problems.  The amount of time that you may need off of the medication may also vary with the medication and the reason for which you were taking it.  If you are taking any of these medications, please make sure you notify your pain physician before you undergo any procedures.         Epidural Steroid Injection Patient Information  Description: The epidural space surrounds the nerves as they exit the spinal cord.  In some patients, the nerves can be compressed and inflamed by a bulging disc or a tight spinal canal (spinal stenosis).  By injecting steroids into the epidural space, we can bring irritated nerves into direct contact with a potentially helpful medication.  These steroids act directly on the irritated nerves and can reduce swelling and inflammation which often leads to decreased pain.  Epidural steroids may be injected anywhere along the spine and from the neck to the low back depending upon the location of your  pain.   After numbing the skin with local anesthetic (like Novocaine), a small needle is passed into the epidural space slowly.  You may experience a sensation of pressure while this is being done.  The entire block usually last less than 10 minutes.  Conditions which may be treated by epidural steroids:   Low back and leg pain  Neck and arm pain  Spinal stenosis  Post-laminectomy syndrome  Herpes zoster (shingles) pain  Pain from compression fractures  Preparation for the injection:  3. Do not eat any solid food or dairy products within 8 hours of  your appointment.  4. You may drink clear liquids up to 3 hours before appointment.  Clear liquids include water, black coffee, juice or soda.  No milk or cream please. 5. You may take your regular medication, including pain medications, with a sip of water before your appointment  Diabetics should hold regular insulin (if taken separately) and take 1/2 normal NPH dos the morning of the procedure.  Carry some sugar containing items with you to your appointment. 6. A driver must accompany you and be prepared to drive you home after your procedure.  7. Bring all your current medications with your. 8. An IV may be inserted and sedation may be given at the discretion of the physician.   9. A blood pressure cuff, EKG and other monitors will often be applied during the procedure.  Some patients may need to have extra oxygen administered for a short period. 10. You will be asked to provide medical information, including your allergies, prior to the procedure.  We must know immediately if you are taking blood thinners (like Coumadin/Warfarin)  Or if you are allergic to IV iodine contrast (dye). We must know if you could possible be pregnant.  Possible side-effects:  Bleeding from needle site  Infection (rare, may require surgery)  Nerve injury (rare)  Numbness & tingling (temporary)  Difficulty urinating (rare, temporary)  Spinal headache  ( a headache worse with upright posture)  Light -headedness (temporary)  Pain at injection site (several days)  Decreased blood pressure (temporary)  Weakness in arm/leg (temporary)  Pressure sensation in back/neck (temporary)  Call if you experience:  Fever/chills associated with headache or increased back/neck pain.  Headache worsened by an upright position.  New onset weakness or numbness of an extremity below the injection site  Hives or difficulty breathing (go to the emergency room)  Inflammation or drainage at the infection site  Severe back/neck pain  Any new symptoms which are concerning to you  Please note:  Although the local anesthetic injected can often make your back or neck feel good for several hours after the injection, the pain will likely return.  It takes 3-7 days for steroids to work in the epidural space.  You may not notice any pain relief for at least that one week.  If effective, we will often do a series of three injections spaced 3-6 weeks apart to maximally decrease your pain.  After the initial series, we generally will wait several months before considering a repeat injection of the same type.  If you have any questions, please call [phone] [facility]

## 2018-01-22 ENCOUNTER — Other Ambulatory Visit: Payer: Self-pay | Admitting: Internal Medicine

## 2018-01-22 DIAGNOSIS — I10 Essential (primary) hypertension: Secondary | ICD-10-CM

## 2018-01-23 LAB — COMP. METABOLIC PANEL (12)
ALK PHOS: 78 IU/L (ref 39–117)
AST: 13 IU/L (ref 0–40)
Albumin/Globulin Ratio: 1.5 (ref 1.2–2.2)
Albumin: 4.4 g/dL (ref 3.5–4.7)
BILIRUBIN TOTAL: 0.3 mg/dL (ref 0.0–1.2)
BUN/Creatinine Ratio: 10 — ABNORMAL LOW (ref 12–28)
BUN: 9 mg/dL (ref 8–27)
CHLORIDE: 93 mmol/L — AB (ref 96–106)
Calcium: 9.5 mg/dL (ref 8.7–10.3)
Creatinine, Ser: 0.86 mg/dL (ref 0.57–1.00)
GFR calc Af Amer: 71 mL/min/{1.73_m2} (ref >59)
GFR calc non Af Amer: 62 mL/min/{1.73_m2} (ref >59)
Globulin, Total: 2.9 g/dL (ref 1.5–4.5)
Glucose: 95 mg/dL (ref 65–99)
POTASSIUM: 4.7 mmol/L (ref 3.5–5.2)
SODIUM: 132 mmol/L — AB (ref 134–144)
Total Protein: 7.3 g/dL (ref 6.0–8.5)

## 2018-01-23 LAB — 25-HYDROXY VITAMIN D LCMS D2+D3
25-Hydroxy, Vitamin D-2: 13 ng/mL
25-Hydroxy, Vitamin D-3: 28 ng/mL
25-Hydroxy, Vitamin D: 41 ng/mL

## 2018-01-23 LAB — SEDIMENTATION RATE: SED RATE: 23 mm/h (ref 0–40)

## 2018-01-23 LAB — MAGNESIUM: MAGNESIUM: 1.8 mg/dL (ref 1.6–2.3)

## 2018-01-23 LAB — VITAMIN B12: VITAMIN B 12: 923 pg/mL (ref 232–1245)

## 2018-01-23 LAB — C-REACTIVE PROTEIN: CRP: 5 mg/L (ref 0–10)

## 2018-01-25 ENCOUNTER — Other Ambulatory Visit: Payer: Self-pay | Admitting: Internal Medicine

## 2018-02-03 ENCOUNTER — Encounter: Payer: Self-pay | Admitting: Pain Medicine

## 2018-02-03 ENCOUNTER — Ambulatory Visit
Admission: RE | Admit: 2018-02-03 | Discharge: 2018-02-03 | Disposition: A | Discharge status: Home / Self Care | Payer: Medicare Other | Source: Ambulatory Visit | Attending: Pain Medicine | Admitting: Pain Medicine

## 2018-02-03 ENCOUNTER — Ambulatory Visit (HOSPITAL_BASED_OUTPATIENT_CLINIC_OR_DEPARTMENT_OTHER): Payer: Medicare Other | Admitting: Pain Medicine

## 2018-02-03 ENCOUNTER — Other Ambulatory Visit: Payer: Self-pay

## 2018-02-03 VITALS — BP 146/67 | HR 60 | Temp 97.4°F | Resp 12 | Ht 66.0 in | Wt 97.0 lb

## 2018-02-03 DIAGNOSIS — Z7901 Long term (current) use of anticoagulants: Secondary | ICD-10-CM | POA: Insufficient documentation

## 2018-02-03 DIAGNOSIS — M5136 Other intervertebral disc degeneration, lumbar region: Secondary | ICD-10-CM | POA: Diagnosis not present

## 2018-02-03 DIAGNOSIS — G8929 Other chronic pain: Secondary | ICD-10-CM | POA: Diagnosis not present

## 2018-02-03 DIAGNOSIS — M9983 Other biomechanical lesions of lumbar region: Secondary | ICD-10-CM | POA: Diagnosis not present

## 2018-02-03 DIAGNOSIS — M5417 Radiculopathy, lumbosacral region: Secondary | ICD-10-CM

## 2018-02-03 DIAGNOSIS — M48061 Spinal stenosis, lumbar region without neurogenic claudication: Secondary | ICD-10-CM | POA: Insufficient documentation

## 2018-02-03 DIAGNOSIS — M79604 Pain in right leg: Secondary | ICD-10-CM

## 2018-02-03 DIAGNOSIS — M5410 Radiculopathy, site unspecified: Secondary | ICD-10-CM

## 2018-02-03 DIAGNOSIS — Z7902 Long term (current) use of antithrombotics/antiplatelets: Secondary | ICD-10-CM | POA: Insufficient documentation

## 2018-02-03 DIAGNOSIS — R29898 Other symptoms and signs involving the musculoskeletal system: Secondary | ICD-10-CM

## 2018-02-03 DIAGNOSIS — M79605 Pain in left leg: Secondary | ICD-10-CM | POA: Insufficient documentation

## 2018-02-03 DIAGNOSIS — M792 Neuralgia and neuritis, unspecified: Secondary | ICD-10-CM

## 2018-02-03 MED ORDER — LACTATED RINGERS IV SOLN: 1000.0000 mL | Freq: Once | INTRAVENOUS | Status: DC

## 2018-02-03 MED ORDER — ROPIVACAINE HCL 2 MG/ML IJ SOLN
1.0000 mL | Freq: Once | INTRAMUSCULAR | Status: AC
  Administered 2018-02-03: 3 mL via EPIDURAL
  Filled 2018-02-03: qty 10

## 2018-02-03 MED ORDER — SODIUM CHLORIDE 0.9% FLUSH: 2.0000 mL | Freq: Once | INTRAVENOUS | Status: DC

## 2018-02-03 MED ORDER — TRIAMCINOLONE ACETONIDE 40 MG/ML IJ SUSP
40.0000 mg | Freq: Once | INTRAMUSCULAR | Status: AC
  Administered 2018-02-03: 40 mg
  Filled 2018-02-03: qty 1

## 2018-02-03 MED ORDER — ROPIVACAINE HCL 2 MG/ML IJ SOLN
2.0000 mL | Freq: Once | INTRAMUSCULAR | Status: DC
  Filled 2018-02-03: qty 10

## 2018-02-03 MED ORDER — SODIUM CHLORIDE 0.9% FLUSH
1.0000 mL | Freq: Once | INTRAVENOUS | Status: AC
  Administered 2018-02-03: 3 mL

## 2018-02-03 MED ORDER — DEXAMETHASONE SODIUM PHOSPHATE 10 MG/ML IJ SOLN
10.0000 mg | Freq: Once | INTRAMUSCULAR | Status: AC
  Administered 2018-02-03: 10 mg
  Filled 2018-02-03: qty 1

## 2018-02-03 MED ORDER — GABAPENTIN 300 MG PO CAPS: 300.0000 mg | ORAL_CAPSULE | Freq: Every day | ORAL | 1 refills | Status: DC

## 2018-02-03 MED ORDER — FENTANYL CITRATE (PF) 100 MCG/2ML IJ SOLN
25.0000 ug | INTRAMUSCULAR | Status: DC | PRN
  Administered 2018-02-03: 50 ug via INTRAVENOUS
  Filled 2018-02-03: qty 2

## 2018-02-03 MED ORDER — SODIUM CHLORIDE 0.9 % IJ SOLN
INTRAMUSCULAR | Status: AC
  Filled 2018-02-03: qty 10

## 2018-02-03 MED ORDER — IOPAMIDOL (ISOVUE-M 200) INJECTION 41%
10.0000 mL | Freq: Once | INTRAMUSCULAR | Status: AC
  Administered 2018-02-03: 10 mL via EPIDURAL
  Filled 2018-02-03: qty 10

## 2018-02-03 MED ORDER — MIDAZOLAM HCL 5 MG/5ML IJ SOLN
1.0000 mg | INTRAMUSCULAR | Status: DC | PRN
  Administered 2018-02-03: 1 mg via INTRAVENOUS
  Filled 2018-02-03: qty 5

## 2018-02-03 MED ORDER — LIDOCAINE HCL 2 % IJ SOLN
20.0000 mL | Freq: Once | INTRAMUSCULAR | Status: AC
  Administered 2018-02-03: 400 mg
  Filled 2018-02-03: qty 40

## 2018-02-03 NOTE — Patient Instructions (Addendum)
___Gabapentin sent to your pharmacy. You can restart blood thinners by tomorrow as prescribed. _________________________________________________________________________________________  Post-Procedure Discharge Instructions  Instructions:  Apply ice: Fill a plastic sandwich bag with crushed ice. Cover it with a small towel and apply to injection site. Apply for 15 minutes then remove x 15 minutes. Repeat sequence on day of procedure, until you go to bed. The purpose is to minimize swelling and discomfort after procedure.  Apply heat: Apply heat to procedure site starting the day following the procedure. The purpose is to treat any soreness and discomfort from the procedure.  Food intake: Start with clear liquids (like water) and advance to regular food, as tolerated.   Physical activities: Keep activities to a minimum for the first 8 hours after the procedure.   Driving: If you have received any sedation, you are not allowed to drive for 24 hours after your procedure.  Blood thinner: Restart your blood thinner 6 hours after your procedure. (Only for those taking blood thinners)  Insulin: As soon as you can eat, you may resume your normal dosing schedule. (Only for those taking insulin)  Infection prevention: Keep procedure site clean and dry.  Post-procedure Pain Diary: Extremely important that this be done correctly and accurately. Recorded information will be used to determine the next step in treatment.  Pain evaluated is that of treated area only. Do not include pain from an untreated area.  Complete every hour, on the hour, for the initial 8 hours. Set an alarm to help you do this part accurately.  Do not go to sleep and have it completed later. It will not be accurate.  Follow-up appointment: Keep your follow-up appointment after the procedure. Usually 2 weeks for most procedures. (6 weeks in the case of radiofrequency.) Bring you pain diary.   Expect:  From numbing medicine  (AKA: Local Anesthetics): Numbness or decrease in pain.  Onset: Full effect within 15 minutes of injected.  Duration: It will depend on the type of local anesthetic used. On the average, 1 to 8 hours.   From steroids: Decrease in swelling or inflammation. Once inflammation is improved, relief of the pain will follow.  Onset of benefits: Depends on the amount of swelling present. The more swelling, the longer it will take for the benefits to be seen. In some cases, up to 10 days.  Duration: Steroids will stay in the system x 2 weeks. Duration of benefits will depend on multiple posibilities including persistent irritating factors.  From procedure: Some discomfort is to be expected once the numbing medicine wears off. This should be minimal if ice and heat are applied as instructed.  Call if:  You experience numbness and weakness that gets worse with time, as opposed to wearing off.  New onset bowel or bladder incontinence. (This applies to Spinal procedures only)  Emergency Numbers:  [place] business hours (Monday - Thursday, 8:00 AM - 4:00 PM) (Friday, 9:00 AM - 12:00 Noon): [phone]  After hours: [phone] ____________________________________________________________________________________________

## 2018-02-03 NOTE — Progress Notes (Signed)
Safety precautions to be maintained throughout the outpatient stay will include: orient to surroundings, keep bed in low position, maintain call bell within reach at all times, provide assistance with transfer out of bed and ambulation.  

## 2018-02-03 NOTE — Progress Notes (Signed)
Patient's Name: Tasha Reyes  MRN: [id number]  Referring Provider: Glean Hess, MD  DOB: 06-27-1931  PCP: Glean Hess, MD  DOS: 02/03/2018  Note by: Gaspar Cola, MD  Service setting: Ambulatory outpatient  Specialty: Interventional Pain Management  Patient type: Established  Location: [facility] (AMB) Pain Management Facility  Visit type: Interventional Procedure   Primary Reason for Visit: Interventional Pain Management Treatment. CC: Leg Pain (both)  Procedure #1:  Anesthesia, Analgesia, Anxiolysis:  Type: Diagnostic Inter-Laminar Epidural Steroid Injection  #1  Region: Lumbar Level: L3-4 Level. Laterality: Left-Sided Paramedial   Type: Moderate (Conscious) Sedation combined with Local Anesthesia Indication(s): Analgesia and Anxiety Route: Intravenous (IV) IV Access: Secured Sedation: Meaningful verbal contact was maintained at all times during the procedure  Local Anesthetic: Lidocaine 1-2%  Procedure #2:    Type: Diagnostic Trans-Foraminal Epidural Steroid Injection  #1  Region: Lumbar Level: L4 Paravertebral Laterality: Right-Sided Paravertebral Position: Prone     Indications: 1. DDD (degenerative disc disease), lumbar   2. Other intervertebral disc degeneration, lumbar region   3. Chronic radicular pain of lower extremity (Right)   4. Chronic lower extremity pain (Location of Primary Source of Pain) (Right)   5. Lumbar foraminal stenosis (Right) (Severe at L4-5)   6. Lumbosacral Radiculopathy (Right)   7. Chronic anticoagulation (Plavix)    Pain Score: Pre-procedure: 5 /10 Post-procedure: 0-No pain/10  Pre-op Assessment:  Tasha Reyes is a 84 y.o. (year old), female patient, seen today for interventional treatment. She  has a past surgical history that includes Partial hysterectomy; [name]; [name], 2008); Back surgery; Colonoscopy (2012); Esophagogastroduodenoscopy  (04/2013); Abdominal hysterectomy; Appendectomy; blood clot ; Hip Arthroplasty (Right, 06/17/2017); and Joint replacement (Right, 06/19/2017). Tasha Reyes has a current medication list which includes the following prescription(s): albuterol, atenolol, atorvastatin, vitamin d3, clopidogrel, ferosul, hydrocodone-acetaminophen, hydrocodone-acetaminophen, hydrocodone-acetaminophen, methocarbamol, ondansetron, oxygen-helium, polyethylene glycol powder, prednisone, and gabapentin, and the following Facility-Administered Medications: fentanyl, lactated ringers, midazolam, ropivacaine (pf) 2 mg/ml (0.2%), and sodium chloride flush. Her primarily concern today is the Leg Pain (both)  Today she complains of pain and weakness in both lower extremities. In the case of the right lower extremity the pain and numbness goes to the top of the foot in what seems to be an L5 dermatomal distribution. In the case of the left lower extremity pain goes to the back of the leg down to her calf and it seems to be more of a referred type pain versus a sacral radiculopathy. She describes weakness on plantar flexion of her right foot. She indicates that lately she has been having problem standing for any given period time. She is now using a walker, which she did not used to need in the past. The patient also indicates that the Neurontin that she was started on by Crystal has provided her with excellent relief of her pain, especially at nighttime. She indicates that she has been titrating the medication up and she is currently taking 300 mg at bedtime. At this time, I will switch her from the 100 mg pills to the 300 mg pill and I will have her take 1 at bedtime. Today I have given her a 6 month refill of this medication. She indicates no side effects or problems with the medicine.  Initial Vital Signs:  Pulse/HCG Rate: 71ECG Heart Rate: 70 Temp: 98 F (36.7 C) Resp: 18 BP: (!) 168/77 SpO2: 90 %  BMI: Estimated body mass index  is  15.66 kg/m as calculated from the following:   Height as of this encounter: 5\' 6"  (1.676 m).   Weight as of this encounter: 97 lb (44 kg).  Risk Assessment: Allergies: Reviewed. She has No Known Allergies.  Allergy Precautions: None required Coagulopathies: Reviewed. None identified.  Blood-thinner therapy: None at this time Active Infection(s): Reviewed. None identified. Tasha Reyes is afebrile  Site Confirmation: Tasha Reyes was asked to confirm the procedure and laterality before marking the site Procedure checklist: Completed Consent: Before the procedure and under the influence of no sedative(s), amnesic(s), or anxiolytics, the patient was informed of the treatment options, risks and possible complications. To fulfill our ethical and legal obligations, as recommended by the American Medical Association's Code of Ethics, I have informed the patient of my clinical impression; the nature and purpose of the treatment or procedure; the risks, benefits, and possible complications of the intervention; the alternatives, including doing nothing; the risk(s) and benefit(s) of the alternative treatment(s) or procedure(s); and the risk(s) and benefit(s) of doing nothing. The patient was provided information about the general risks and possible complications associated with the procedure. These may include, but are not limited to: failure to achieve desired goals, infection, bleeding, organ or nerve damage, allergic reactions, paralysis, and death. In addition, the patient was informed of those risks and complications associated to Spine-related procedures, such as failure to decrease pain; infection (i.e.: Meningitis, epidural or intraspinal abscess); bleeding (i.e.: epidural hematoma, subarachnoid hemorrhage, or any other type of intraspinal or peri-dural bleeding); organ or nerve damage (i.e.: Any type of peripheral nerve, nerve root, or spinal cord injury) with subsequent damage to sensory, motor,  and/or autonomic systems, resulting in permanent pain, numbness, and/or weakness of one or several areas of the body; allergic reactions; (i.e.: anaphylactic reaction); and/or death. Furthermore, the patient was informed of those risks and complications associated with the medications. These include, but are not limited to: allergic reactions (i.e.: anaphylactic or anaphylactoid reaction(s)); adrenal axis suppression; blood sugar elevation that in diabetics may result in ketoacidosis or comma; water retention that in patients with history of congestive heart failure may result in shortness of breath, pulmonary edema, and decompensation with resultant heart failure; weight gain; swelling or edema; medication-induced neural toxicity; particulate matter embolism and blood vessel occlusion with resultant organ, and/or nervous system infarction; and/or aseptic necrosis of one or more joints. Finally, the patient was informed that Medicine is not an exact science; therefore, there is also the possibility of unforeseen or unpredictable risks and/or possible complications that may result in a catastrophic outcome. The patient indicated having understood very clearly. We have given the patient no guarantees and we have made no promises. Enough time was given to the patient to ask questions, all of which were answered to the patient's satisfaction. Tasha Reyes has indicated that she wanted to continue with the procedure. Attestation: I, the ordering provider, attest that I have discussed with the patient the benefits, risks, side-effects, alternatives, likelihood of achieving goals, and potential problems during recovery for the procedure that I have provided informed consent. Date  Time: 02/03/2018 12:28 PM  Pre-Procedure Preparation:  Monitoring: As per clinic protocol. Respiration, ETCO2, SpO2, BP, heart rate and rhythm monitor placed and checked for adequate function Safety Precautions: Patient was assessed for  positional comfort and pressure points before starting the procedure. Time-out: I initiated and conducted the "Time-out" before starting the procedure, as per protocol. The patient was asked to participate by confirming the accuracy of the "  Time Out" information. Verification of the correct person, site, and procedure were performed and confirmed by me, the nursing staff, and the patient. "Time-out" conducted as per Joint Commission's Universal Protocol (UP.01.01.01). Time: 1259  Description of Procedure #1:  Target Area: The  interlaminar space, initially targeting the lower border of the superior vertebral body lamina. Approach: Posterior paramedial approach. Area Prepped: Entire Posterior Lumbosacral Region Prepping solution: ChloraPrep (2% chlorhexidine gluconate and 70% isopropyl alcohol) Safety Precautions: Aspiration looking for blood return was conducted prior to all injections. At no point did we inject any substances, as a needle was being advanced. No attempts were made at seeking any paresthesias. Safe injection practices and needle disposal techniques used. Medications properly checked for expiration dates. SDV (single dose vial) medications used. Description of the Procedure: Protocol guidelines were followed. The patient was placed in position over the fluoroscopy table. The target area was identified and the area prepped in the usual manner. Skin & deeper tissues infiltrated with local anesthetic. Appropriate amount of time allowed to pass for local anesthetics to take effect. The procedure needle was introduced through the skin, ipsilateral to the reported pain, and advanced to the target area. Bone was contacted and the needle walked caudad, until the lamina was cleared. The ligamentum flavum was engaged and loss-of-resistance technique used as the epidural needle was advanced. The epidural space was identified using "loss-of-resistance technique" with 2-3 ml of PF-NaCl (0.9% NSS), in a  5cc LOR glass syringe. Proper needle placement secured. Negative aspiration confirmed. Solution injected in intermittent fashion, asking for systemic symptoms every 0.5cc of injectate. The needles were then removed and the area cleansed, making sure to leave some of the prepping solution back to take advantage of its long term bactericidal properties. Start Time: 1259 hrs. Materials:  Needle(s) Type: Epidural needle Gauge: 17G Length: 3.5-in Medication(s): Please see orders for medications and dosing details.  Description of Procedure #2:  Target Area: The inferior and lateral portion of the pedicle, just lateral to a line created by the 6:00 position of the pedicle and the superior articular process of the vertebral body below. On the lateral view, this target lies just posterior to the anterior aspect of the lamina and posterior to the midpoint created between the anterior and the posterior aspect of the neural foramina. Approach: Posterior paravertebral approach. Area Prepped: Same as above Prepping solution: Same as above Safety Precautions: Same as above Description of the Procedure: Protocol guidelines were followed. The patient was placed in position over the fluoroscopy table. The target area was identified and the area prepped in the usual manner. Skin & deeper tissues infiltrated with local anesthetic. Appropriate amount of time allowed to pass for local anesthetics to take effect. The procedure needles were then advanced to the target area. Proper needle placement secured. Negative aspiration confirmed. Solution injected in intermittent fashion, asking for systemic symptoms every 0.2cc of injectate. The needles were then removed and the area cleansed, making sure to leave some of the prepping solution back to take advantage of its long term bactericidal properties. Vitals:   02/03/18 1314 02/03/18 1324 02/03/18 1334 02/03/18 1344  BP: (!) 167/72 (!) 124/54 (!) 124/51 (!) 146/67  Pulse:   98 (!) 57 60  Resp: 15 13 14 12   Temp:  98 F (36.7 C)  (!) 97.4 F (36.3 C)  TempSrc:  Temporal  Temporal  SpO2: 99% 92% 90% 98%  Weight:      Height:  End Time: 1314 hrs. Materials:  Needle(s) Type: Regular needle Gauge: 25G Length: 3.5-in Medication(s): Please see orders for medications and dosing details.  Imaging Guidance (Spinal):          Type of Imaging Technique: Fluoroscopy Guidance (Spinal) Indication(s): Assistance in needle guidance and placement for procedures requiring needle placement in or near specific anatomical locations not easily accessible without such assistance. Exposure Time: Please see nurses notes. Contrast: Before injecting any contrast, we confirmed that the patient did not have an allergy to iodine, shellfish, or radiological contrast. Once satisfactory needle placement was completed at the desired level, radiological contrast was injected. Contrast injected under live fluoroscopy. No contrast complications. See chart for type and volume of contrast used. Fluoroscopic Guidance: I was personally present during the use of fluoroscopy. "Tunnel Vision Technique" used to obtain the best possible view of the target area. Parallax error corrected before commencing the procedure. "Direction-depth-direction" technique used to introduce the needle under continuous pulsed fluoroscopy. Once target was reached, antero-posterior, oblique, and lateral fluoroscopic projection used confirm needle placement in all planes. Images permanently stored in EMR. Interpretation: I personally interpreted the imaging intraoperatively. Adequate needle placement confirmed in multiple planes. Appropriate spread of contrast into desired area was observed. No evidence of afferent or efferent intravascular uptake. No intrathecal or subarachnoid spread observed. Permanent images saved into the patient's record.  Antibiotic Prophylaxis:   Anti-infectives (From admission, onward)   None      Indication(s): None identified  Post-operative Assessment:  Post-procedure Vital Signs:  Pulse/HCG Rate: 6067 Temp: (!) 97.4 F (36.3 C) Resp: 12 BP: (!) 146/67 SpO2: 98 %  EBL: None  Complications: No immediate post-treatment complications observed by team, or reported by patient.  Note: The patient tolerated the entire procedure well. A repeat set of vitals were taken after the procedure and the patient was kept under observation following institutional policy, for this type of procedure. Post-procedural neurological assessment was performed, showing return to baseline, prior to discharge. The patient was provided with post-procedure discharge instructions, including a section on how to identify potential problems. Should any problems arise concerning this procedure, the patient was given instructions to immediately contact us, at any time, without hesitation. In any case, we plan to contact the patient by telephone for a follow-up status report regarding this interventional procedure.  Comments:  No additional relevant information.  Plan of Care   Interventional therapies: Planned, scheduled, and/or pending:  NOTE: Stop Plavix 7 days prior to procedures. 1. Diagnostic right-sided L4 TFESI #1 + left sided L3-4 interlaminar LESI #1 under fluoroscopic guidance and IV sedation, today.  2. The patient's last lumbar MRI was done in 2016 and she seems to be experiencing some little symptoms on the right side. We will be repeating this MRI and comparing it to the 2016 lumbar MRI to determine the progression of her condition.  3. The patient did great with her gabapentin trial and today I will provide her with a prescription for 300 mg by mouth at bedtime to last her approximately 6 months.  4. Because of the patient's lower extremity weakness, I will be ordering a nerve conduction test of her lower extremities to assess the acuity of the symptoms.    Considering:  Diagnostic  right-sided L4-5 TFESI #2  Diagnostic right-sided L4-5 LESI #1  Diagnostic left sided L3-4 LESI #2  Diagnostic bilateral lumbar facet block  Possible bilateral lumbar facet RFA     Palliative PRN treatment(s):  None at this time  Imaging Orders     MR LUMBAR SPINE WO CONTRAST     DG C-Arm 1-60 Min-No Report  Procedure Orders     Lumbar Epidural Injection     Lumbar Transforaminal Epidural  Medications ordered for procedure: Meds ordered this encounter  Medications  . iopamidol (ISOVUE-M) 41 % intrathecal injection 10 mL    Must be Myelogram-compatible. If not available, you may substitute with a water-soluble, non-ionic, hypoallergenic, myelogram-compatible radiological contrast medium.  Marland Kitchen lidocaine (XYLOCAINE) 2 % (with pres) injection 400 mg  . midazolam (VERSED) 5 MG/5ML injection 1-2 mg    Make sure Flumazenil is available in the pyxis when using this medication. If oversedation occurs, administer 0.2 mg IV over 15 sec. If after 45 sec no response, administer 0.2 mg again over 1 min; may repeat at 1 min intervals; not to exceed 4 doses (1 mg)  . fentaNYL (SUBLIMAZE) injection 25-50 mcg    Make sure Narcan is available in the pyxis when using this medication. In the event of respiratory depression (RR< 8/min): Titrate NARCAN (naloxone) in increments of 0.1 to 0.2 mg IV at 2-3 minute intervals, until desired degree of reversal.  . lactated ringers infusion 1,000 mL  . sodium chloride flush (NS) 0.9 % injection 1 mL  . ropivacaine (PF) 2 mg/mL (0.2%) (NAROPIN) injection 1 mL  . dexamethasone (DECADRON) injection 10 mg  . sodium chloride flush (NS) 0.9 % injection 2 mL  . ropivacaine (PF) 2 mg/mL (0.2%) (NAROPIN) injection 2 mL  . triamcinolone acetonide (KENALOG-40) injection 40 mg  . gabapentin (NEURONTIN) 300 MG capsule    Sig: Take 1 capsule (300 mg total) by mouth at bedtime.    Dispense:  90 capsule    Refill:  1    Do not place this medication, or any other  prescription from our practice, on "Automatic Refill". Patient may have prescription filled one day early if pharmacy is closed on scheduled refill date.   Medications administered: We administered iopamidol, lidocaine, midazolam, fentaNYL, sodium chloride flush, ropivacaine (PF) 2 mg/mL (0.2%), dexamethasone, and triamcinolone acetonide.  See the medical record for exact dosing, route, and time of administration.  New Prescriptions   GABAPENTIN (NEURONTIN) 300 MG CAPSULE    Take 1 capsule (300 mg total) by mouth at bedtime.   Disposition: Discharge home  Discharge Date & Time: 02/03/2018; 1351 hrs.   Physician-requested Follow-up: Return for post-procedure eval (2 wks), F/U eval (after test completion), w/ Dr. Dossie Arbour.  Future Appointments  Date Time Provider [place]  02/16/2018  2:00 PM Milinda Pointer, MD [facility] None  03/11/2018  1:30 PM [place]o ADVISOR [facility] None  03/20/2018 10:00 AM [facility] 1 [facility] None  03/20/2018 10:30 AM [facility] 1 [facility] None  03/20/2018 11:30 AM Dew, Erskine Squibb, MD [facility] None  03/23/2018  2:45 PM Glean Hess, MD [facility] None  04/22/2018  1:30 PM Vevelyn Francois, NP [facility] None   Primary Care Physician: Glean Hess, MD Location: [facility] Note by: Gaspar Cola, MD Date: 02/03/2018; Time: 2:42 PM  Disclaimer:  Medicine is not an Chief Strategy Officer. The only guarantee in medicine is that nothing is guaranteed. It is important to note that the decision to proceed with this intervention was based on the information collected from the patient. The Data and conclusions were drawn from the patient's questionnaire, the interview, and the physical examination. Because the information was provided in large part by the patient, it cannot  be guaranteed that it has not been purposely or unconsciously manipulated. Every effort has been made to obtain as much relevant data as possible for this evaluation.  It is important to note that the conclusions that lead to this procedure are derived in large part from the available data. Always take into account that the treatment will also be dependent on availability of resources and existing treatment guidelines, considered by other Pain Management Practitioners as being common knowledge and practice, at the time of the intervention. For Medico-Legal purposes, it is also important to point out that variation in procedural techniques and pharmacological choices are the acceptable norm. The indications, contraindications, technique, and results of the above procedure should only be interpreted and judged by a Board-Certified Interventional Pain Specialist with extensive familiarity and expertise in the same exact procedure and technique.

## 2018-02-04 ENCOUNTER — Telehealth: Payer: Self-pay | Admitting: *Deleted

## 2018-02-04 NOTE — Telephone Encounter (Signed)
Spoke with patient re; procedure on yesterday,  Verbalizes no complaints.

## 2018-02-16 ENCOUNTER — Ambulatory Visit
Admission: RE | Admit: 2018-02-16 | Discharge: 2018-02-16 | Disposition: A | Discharge status: Home / Self Care | Payer: Medicare Other | Source: Ambulatory Visit | Attending: Pain Medicine | Admitting: Pain Medicine

## 2018-02-16 ENCOUNTER — Ambulatory Visit: Payer: Medicare Other | Admitting: Pain Medicine

## 2018-02-16 ENCOUNTER — Other Ambulatory Visit: Payer: Self-pay | Admitting: Nurse Practitioner

## 2018-02-16 DIAGNOSIS — M5410 Radiculopathy, site unspecified: Secondary | ICD-10-CM

## 2018-02-16 DIAGNOSIS — M79605 Pain in left leg: Principal | ICD-10-CM

## 2018-02-16 DIAGNOSIS — M48061 Spinal stenosis, lumbar region without neurogenic claudication: Secondary | ICD-10-CM | POA: Insufficient documentation

## 2018-02-16 DIAGNOSIS — M5136 Other intervertebral disc degeneration, lumbar region: Secondary | ICD-10-CM | POA: Diagnosis present

## 2018-02-16 DIAGNOSIS — G8929 Other chronic pain: Secondary | ICD-10-CM

## 2018-02-16 DIAGNOSIS — M792 Neuralgia and neuritis, unspecified: Secondary | ICD-10-CM

## 2018-02-16 DIAGNOSIS — M79604 Pain in right leg: Secondary | ICD-10-CM

## 2018-02-16 DIAGNOSIS — M5126 Other intervertebral disc displacement, lumbar region: Secondary | ICD-10-CM | POA: Insufficient documentation

## 2018-02-16 DIAGNOSIS — M47816 Spondylosis without myelopathy or radiculopathy, lumbar region: Secondary | ICD-10-CM | POA: Insufficient documentation

## 2018-02-16 DIAGNOSIS — M5417 Radiculopathy, lumbosacral region: Secondary | ICD-10-CM

## 2018-02-16 DIAGNOSIS — M545 Low back pain: Secondary | ICD-10-CM | POA: Diagnosis not present

## 2018-02-21 DIAGNOSIS — R0902 Hypoxemia: Secondary | ICD-10-CM | POA: Diagnosis not present

## 2018-02-21 DIAGNOSIS — R110 Nausea: Secondary | ICD-10-CM | POA: Diagnosis not present

## 2018-02-21 DIAGNOSIS — I10 Essential (primary) hypertension: Secondary | ICD-10-CM | POA: Diagnosis not present

## 2018-02-22 DIAGNOSIS — E785 Hyperlipidemia, unspecified: Secondary | ICD-10-CM | POA: Diagnosis not present

## 2018-02-22 DIAGNOSIS — I161 Hypertensive emergency: Secondary | ICD-10-CM | POA: Diagnosis not present

## 2018-02-22 DIAGNOSIS — I952 Hypotension due to drugs: Secondary | ICD-10-CM | POA: Diagnosis not present

## 2018-02-22 DIAGNOSIS — Z681 Body mass index (BMI) 19 or less, adult: Secondary | ICD-10-CM | POA: Diagnosis not present

## 2018-02-22 DIAGNOSIS — Z8679 Personal history of other diseases of the circulatory system: Secondary | ICD-10-CM | POA: Diagnosis not present

## 2018-02-22 DIAGNOSIS — Z9981 Dependence on supplemental oxygen: Secondary | ICD-10-CM | POA: Diagnosis not present

## 2018-02-22 DIAGNOSIS — E875 Hyperkalemia: Secondary | ICD-10-CM | POA: Diagnosis not present

## 2018-02-22 DIAGNOSIS — R001 Bradycardia, unspecified: Secondary | ICD-10-CM | POA: Diagnosis not present

## 2018-02-22 DIAGNOSIS — Z7902 Long term (current) use of antithrombotics/antiplatelets: Secondary | ICD-10-CM | POA: Diagnosis not present

## 2018-02-22 DIAGNOSIS — I447 Left bundle-branch block, unspecified: Secondary | ICD-10-CM | POA: Diagnosis not present

## 2018-02-22 DIAGNOSIS — T447X1A Poisoning by beta-adrenoreceptor antagonists, accidental (unintentional), initial encounter: Secondary | ICD-10-CM | POA: Diagnosis not present

## 2018-02-22 DIAGNOSIS — T50901A Poisoning by unspecified drugs, medicaments and biological substances, accidental (unintentional), initial encounter: Secondary | ICD-10-CM | POA: Diagnosis not present

## 2018-02-22 DIAGNOSIS — R51 Headache: Secondary | ICD-10-CM | POA: Diagnosis not present

## 2018-02-22 DIAGNOSIS — R42 Dizziness and giddiness: Secondary | ICD-10-CM | POA: Diagnosis not present

## 2018-02-22 DIAGNOSIS — R0600 Dyspnea, unspecified: Secondary | ICD-10-CM | POA: Diagnosis not present

## 2018-02-22 DIAGNOSIS — Z86718 Personal history of other venous thrombosis and embolism: Secondary | ICD-10-CM | POA: Diagnosis not present

## 2018-02-22 DIAGNOSIS — J929 Pleural plaque without asbestos: Secondary | ICD-10-CM | POA: Diagnosis not present

## 2018-02-22 DIAGNOSIS — I10 Essential (primary) hypertension: Secondary | ICD-10-CM | POA: Diagnosis not present

## 2018-02-22 DIAGNOSIS — J449 Chronic obstructive pulmonary disease, unspecified: Secondary | ICD-10-CM | POA: Diagnosis not present

## 2018-02-22 DIAGNOSIS — F17210 Nicotine dependence, cigarettes, uncomplicated: Secondary | ICD-10-CM | POA: Diagnosis not present

## 2018-02-22 DIAGNOSIS — Z96643 Presence of artificial hip joint, bilateral: Secondary | ICD-10-CM | POA: Diagnosis not present

## 2018-02-22 DIAGNOSIS — J90 Pleural effusion, not elsewhere classified: Secondary | ICD-10-CM | POA: Diagnosis not present

## 2018-02-22 DIAGNOSIS — Z79899 Other long term (current) drug therapy: Secondary | ICD-10-CM | POA: Diagnosis not present

## 2018-02-23 ENCOUNTER — Ambulatory Visit: Payer: Medicare Other | Admitting: Pain Medicine

## 2018-02-24 DIAGNOSIS — G8929 Other chronic pain: Secondary | ICD-10-CM | POA: Diagnosis not present

## 2018-02-24 DIAGNOSIS — M545 Low back pain: Secondary | ICD-10-CM | POA: Diagnosis not present

## 2018-02-24 DIAGNOSIS — R200 Anesthesia of skin: Secondary | ICD-10-CM | POA: Diagnosis not present

## 2018-02-24 DIAGNOSIS — M5442 Lumbago with sciatica, left side: Secondary | ICD-10-CM | POA: Insufficient documentation

## 2018-02-24 DIAGNOSIS — R29898 Other symptoms and signs involving the musculoskeletal system: Secondary | ICD-10-CM | POA: Diagnosis not present

## 2018-02-25 ENCOUNTER — Other Ambulatory Visit: Payer: Self-pay | Admitting: Internal Medicine

## 2018-02-25 DIAGNOSIS — J449 Chronic obstructive pulmonary disease, unspecified: Secondary | ICD-10-CM

## 2018-02-26 ENCOUNTER — Other Ambulatory Visit: Payer: Self-pay | Admitting: Internal Medicine

## 2018-03-02 ENCOUNTER — Ambulatory Visit: Payer: Medicare Other | Attending: Pain Medicine | Admitting: Nurse Practitioner

## 2018-03-02 ENCOUNTER — Other Ambulatory Visit: Payer: Self-pay

## 2018-03-02 ENCOUNTER — Encounter: Payer: Self-pay | Admitting: Nurse Practitioner

## 2018-03-02 VITALS — BP 133/76 | HR 87 | Temp 98.1°F | Resp 20 | Ht 66.0 in | Wt 97.0 lb

## 2018-03-02 DIAGNOSIS — M79605 Pain in left leg: Secondary | ICD-10-CM

## 2018-03-02 DIAGNOSIS — Z5181 Encounter for therapeutic drug level monitoring: Secondary | ICD-10-CM | POA: Diagnosis not present

## 2018-03-02 DIAGNOSIS — Z9981 Dependence on supplemental oxygen: Secondary | ICD-10-CM | POA: Diagnosis not present

## 2018-03-02 DIAGNOSIS — M5116 Intervertebral disc disorders with radiculopathy, lumbar region: Secondary | ICD-10-CM | POA: Insufficient documentation

## 2018-03-02 DIAGNOSIS — G894 Chronic pain syndrome: Secondary | ICD-10-CM | POA: Insufficient documentation

## 2018-03-02 DIAGNOSIS — Z720 Tobacco use: Secondary | ICD-10-CM | POA: Diagnosis not present

## 2018-03-02 DIAGNOSIS — G2581 Restless legs syndrome: Secondary | ICD-10-CM | POA: Diagnosis not present

## 2018-03-02 DIAGNOSIS — M48061 Spinal stenosis, lumbar region without neurogenic claudication: Secondary | ICD-10-CM | POA: Insufficient documentation

## 2018-03-02 DIAGNOSIS — Z800 Family history of malignant neoplasm of digestive organs: Secondary | ICD-10-CM | POA: Insufficient documentation

## 2018-03-02 DIAGNOSIS — Z90710 Acquired absence of both cervix and uterus: Secondary | ICD-10-CM | POA: Diagnosis not present

## 2018-03-02 DIAGNOSIS — Z809 Family history of malignant neoplasm, unspecified: Secondary | ICD-10-CM | POA: Insufficient documentation

## 2018-03-02 DIAGNOSIS — M47816 Spondylosis without myelopathy or radiculopathy, lumbar region: Secondary | ICD-10-CM | POA: Diagnosis not present

## 2018-03-02 DIAGNOSIS — I714 Abdominal aortic aneurysm, without rupture: Secondary | ICD-10-CM | POA: Diagnosis not present

## 2018-03-02 DIAGNOSIS — I739 Peripheral vascular disease, unspecified: Secondary | ICD-10-CM | POA: Diagnosis not present

## 2018-03-02 DIAGNOSIS — J449 Chronic obstructive pulmonary disease, unspecified: Secondary | ICD-10-CM | POA: Diagnosis not present

## 2018-03-02 DIAGNOSIS — E785 Hyperlipidemia, unspecified: Secondary | ICD-10-CM | POA: Diagnosis not present

## 2018-03-02 DIAGNOSIS — M79661 Pain in right lower leg: Secondary | ICD-10-CM | POA: Diagnosis not present

## 2018-03-02 DIAGNOSIS — Z86718 Personal history of other venous thrombosis and embolism: Secondary | ICD-10-CM | POA: Insufficient documentation

## 2018-03-02 DIAGNOSIS — I10 Essential (primary) hypertension: Secondary | ICD-10-CM | POA: Diagnosis not present

## 2018-03-02 DIAGNOSIS — M79604 Pain in right leg: Secondary | ICD-10-CM

## 2018-03-02 DIAGNOSIS — K274 Chronic or unspecified peptic ulcer, site unspecified, with hemorrhage: Secondary | ICD-10-CM | POA: Insufficient documentation

## 2018-03-02 DIAGNOSIS — F325 Major depressive disorder, single episode, in full remission: Secondary | ICD-10-CM | POA: Insufficient documentation

## 2018-03-02 DIAGNOSIS — R51 Headache: Secondary | ICD-10-CM | POA: Diagnosis not present

## 2018-03-02 DIAGNOSIS — G8929 Other chronic pain: Secondary | ICD-10-CM | POA: Diagnosis not present

## 2018-03-02 DIAGNOSIS — Z9889 Other specified postprocedural states: Secondary | ICD-10-CM | POA: Insufficient documentation

## 2018-03-02 DIAGNOSIS — M79662 Pain in left lower leg: Secondary | ICD-10-CM | POA: Insufficient documentation

## 2018-03-02 DIAGNOSIS — R200 Anesthesia of skin: Secondary | ICD-10-CM | POA: Diagnosis not present

## 2018-03-02 DIAGNOSIS — Z841 Family history of disorders of kidney and ureter: Secondary | ICD-10-CM | POA: Insufficient documentation

## 2018-03-02 DIAGNOSIS — K625 Hemorrhage of anus and rectum: Secondary | ICD-10-CM | POA: Insufficient documentation

## 2018-03-02 DIAGNOSIS — E43 Unspecified severe protein-calorie malnutrition: Secondary | ICD-10-CM | POA: Insufficient documentation

## 2018-03-02 DIAGNOSIS — Z780 Asymptomatic menopausal state: Secondary | ICD-10-CM | POA: Insufficient documentation

## 2018-03-02 DIAGNOSIS — E559 Vitamin D deficiency, unspecified: Secondary | ICD-10-CM | POA: Insufficient documentation

## 2018-03-02 NOTE — Progress Notes (Signed)
Patient's Name: Tasha Reyes  MRN: [id number]  Referring Provider: Glean Hess, MD  DOB: [birth year]  PCP: Glean Hess, MD  DOS: 03/02/2018  Note by: Vevelyn Francois NP  Service setting: Ambulatory outpatient  Specialty: Interventional Pain Management  Location: [facility] (AMB) Pain Management Facility    Patient type: Established    Primary Reason(s) for Visit: Encounter for prescription drug management & post-procedure evaluation of chronic illness with mild to moderate exacerbation(Level of risk: moderate) CC: Back Pain (low) and Leg Pain (bilateral)  HPI  [name] is a 84 y.o. year old, female patient, who comes today for a post-procedure evaluation and medication management. She has Dyslipidemia; Compulsive tobacco user syndrome; Chronic peptic ulcer with bleeding; Essential (primary) hypertension; Depression, major, single episode, complete remission ([place]); COPD (chronic obstructive pulmonary disease) ([place]); Restless leg; Chronic tension-type headache, intractable; Chronic lower extremity pain (Location of Primary Source of Pain) (Right); Lumbosacral Radiculopathy (Right); Chronic radicular pain of lower extremity (Right); Peripheral vascular disease ([place]); Hx of long term use of blood thinners (Plavix); Lower extremity pain (Right); Neurogenic pain; History of DVT (deep vein thrombosis); Lumbar facet syndrome (Bilateral); Lumbar foraminal stenosis (Right) (Severe at L4-5); Opioid-induced constipation ([place]); Osteoporosis, post-menopausal; Complaints of weakness of lower extremities (Bilateral); Vitamin D deficiency; Protein-calorie malnutrition, severe; Dependence on supplemental oxygen; Edema due to malnutrition [facility]); AAA (abdominal aortic aneurysm) without rupture ([place]); History of repair of hip fracture; Rectal bleeding; Chronic hip pain (Right); Chronic lower extremity pain (Bilateral) (R>L); Disorder of skeletal system; Problems influencing health status; Pharmacologic therapy;  Nausea; DDD (degenerative disc disease), lumbar; Chronic anticoagulation (Plavix); Accidental medication overdose; Chronic bilateral low back pain; Bilateral leg numbness; Bilateral leg weakness; and Chronic pain syndrome on their problem list. Her primarily concern today is the Back Pain (low) and Leg Pain (bilateral)  Pain Assessment: Location: Lower Back Radiating: legs Onset: More than a month ago Duration: Chronic pain Quality: Constant, Burning, Aching, Tingling Severity: 2 /10 (subjective, self-reported pain score)  Note: Reported level is compatible with observation.                         When using our objective Pain Scale, levels between 6 and 10/10 are said to belong in an emergency room, as it progressively worsens from a 6/10, described as severely limiting, requiring emergency care not usually available at an outpatient pain management facility. At a 6/10 level, communication becomes difficult and requires great effort. Assistance to reach the emergency department may be required. Facial flushing and profuse sweating along with potentially dangerous increases in heart rate and blood pressure will be evident. Effect on ADL:   Timing: Constant Modifying factors: medications BP: 133/76  HR: 87  [name] was last seen on 02/16/2018 for a procedure. During today's appointment we reviewed [name]'s post-procedure results, as well as her outpatient medication regimen.  Further details on both, my assessment(s), as well as the proposed treatment plan, please see below.  Post-Procedure Assessment  02/03/2018 Procedure: Left lumbar epidural steroid injection and right foraminal epidural steroid injection Pre-procedure pain score:  5/10 Post-procedure pain score: 0/10         Influential Factors: BMI: 15.66 kg/m Intra-procedural challenges: None observed.         Assessment challenges: None detected.              Reported side-effects: None.        Post-procedural adverse  reactions or complications: None reported  Sedation: Please see nurses note. When no sedatives are used, the analgesic levels obtained are directly associated to the effectiveness of the local anesthetics. However, when sedation is provided, the level of analgesia obtained during the initial 1 hour following the intervention, is believed to be the result of a combination of factors. These factors may include, but are not limited to: 1. The effectiveness of the local anesthetics used. 2. The effects of the analgesic(s) and/or anxiolytic(s) used. 3. The degree of discomfort experienced by the patient at the time of the procedure. 4. The patients ability and reliability in recalling and recording the events. 5. The presence and influence of possible secondary gains and/or psychosocial factors. Reported result: Relief experienced during the 1st hour after the procedure: 80 % (Ultra-Short Term Relief)            Interpretative annotation: Clinically appropriate result. Analgesia during this period is likely to be Local Anesthetic and/or IV Sedative (Analgesic/Anxiolytic) related.          Effects of local anesthetic: The analgesic effects attained during this period are directly associated to the localized infiltration of local anesthetics and therefore cary significant diagnostic value as to the etiological location, or anatomical origin, of the pain. Expected duration of relief is directly dependent on the pharmacodynamics of the local anesthetic used. Long-acting (4-6 hours) anesthetics used.  Reported result: Relief during the next 4 to 6 hour after the procedure: 80 % (Short-Term Relief)            Interpretative annotation: Clinically appropriate result. Analgesia during this period is likely to be Local Anesthetic-related.          Long-term benefit: Defined as the period of time past the expected duration of local anesthetics (1 hour for short-acting and 4-6 hours for long-acting). With the  possible exception of prolonged sympathetic blockade from the local anesthetics, benefits during this period are typically attributed to, or associated with, other factors such as analgesic sensory neuropraxia, antiinflammatory effects, or beneficial biochemical changes provided by agents other than the local anesthetics.  Reported result: Extended relief following procedure: 0 %(only lasted three days) (Long-Term Relief)            Interpretative annotation: Clinically possible results. Good relief. No permanent benefit expected. Inflammation plays a part in the etiology to the pain.          Current benefits: Defined as reported results that persistent at this point in time.   Analgesia: <50 %            Function: Back to baseline ROM: Back to baseline Interpretative annotation: Recurrence of symptoms. No permanent benefit expected. Effective diagnostic intervention.          Interpretation: Results would suggest a successful diagnostic intervention.                  Plan:  Please see "Plan of Care" for details.                Laboratory Chemistry  Inflammation Markers (CRP: Acute Phase) (ESR: Chronic Phase) Lab Results  Component Value Date   CRP 5 01/20/2018   ESRSEDRATE 23 01/20/2018                         Rheumatology Markers No results found for: RF, ANA, [place], [place], [place], [place], [id number]  Renal Function Markers Lab Results  Component Value Date   BUN 9 01/20/2018   CREATININE 0.86 01/20/2018   BCR 10 (L) 01/20/2018   GFRAA 71 01/20/2018   GFRNONAA 62 01/20/2018                             Hepatic Function Markers Lab Results  Component Value Date   AST 13 01/20/2018   ALT 10 (L) 09/06/2017   ALBUMIN 4.4 01/20/2018   ALKPHOS 78 01/20/2018                        Electrolytes Lab Results  Component Value Date   NA 132 (L) 01/20/2018   K 4.7 01/20/2018   CL 93 (L) 01/20/2018   CALCIUM 9.5 01/20/2018   MG 1.8 01/20/2018                         Neuropathy Markers Lab Results  Component Value Date   VITAMINB12 923 01/20/2018                        Bone Pathology Markers Lab Results  Component Value Date   VD25OH 5.0 (L) 01/08/2016   25OHVITD1 41 01/20/2018   25OHVITD2 13 01/20/2018   25OHVITD3 28 01/20/2018                         Coagulation Parameters Lab Results  Component Value Date   INR 0.97 09/06/2017   LABPROT 12.8 09/06/2017   APTT 110.0 (H) 11/15/2011   PLT 347 09/07/2017                        Cardiovascular Markers Lab Results  Component Value Date   BNP 416.0 (H) 08/19/2016   TROPONINI <0.03 08/19/2016   HGB 9.6 (L) 09/07/2017   HCT 29.3 (L) 09/07/2017                         CA Markers No results found for: CEA, CA125, LABCA2                      Note: Lab results reviewed.  Recent Diagnostic Imaging Results  MR LUMBAR SPINE WO CONTRAST CLINICAL DATA:  Low back pain with difficulty walking for 6 months.  EXAM: MRI LUMBAR SPINE WITHOUT CONTRAST  TECHNIQUE: Multiplanar, multisequence MR imaging of the lumbar spine was performed. No intravenous contrast was administered.  COMPARISON:  None.  FINDINGS: Segmentation:  Standard.  Alignment:  Anatomic.  Vertebrae: No worrisome osseous lesion. Endplate reactive changes above and below L4-5.  Conus medullaris and cauda equina: Conus extends to the L1 level. Conus and cauda equina appear normal.  Paraspinal and other soft tissues: Unremarkable.  Disc levels:  L1-L2:  Annular bulge.  No impingement.  L2-L3:  Annular bulge.  Facet arthropathy.  No impingement.  L3-L4: Central protrusion. Facet arthropathy and ligamentum flavum hypertrophy. Borderline stenosis. No compressive subarticular zone or foraminal zone narrowing.  L4-L5: Advanced disc space narrowing. Central protrusion with osseous spurring. Posterior element hypertrophy affecting facets ligamentum flavum. Mild stenosis. Borderline LEFT L5  neural impingement. BILATERAL foraminal narrowing  L5-S1:  Central protrusion.  Facet arthropathy.  No impingement.  IMPRESSION: Multilevel spondylosis as described, most pronounced at L4-5. Advanced disc space narrowing with  central protrusion and posterior element hypertrophy at this level contributes to mild stenosis, borderline LEFT L5 neural impingement, and BILATERAL foraminal narrowing which could affect either L4 nerve root.  Central protrusion at L3-4 with posterior element hypertrophy, not clearly compressive.  Electronically Signed   By: Staci Righter M.D.   On: 02/16/2018 17:06  Complexity Note: Imaging results reviewed. Results shared with Ms. Muehl, using [employer].                         Meds   Current Outpatient Medications:  .  albuterol (PROVENTIL) (2.5 MG/3ML) 0.083% nebulizer solution, USE 1 VIAL VIA NEBULIZER EVERY 4 HOURS AS NEEDED FOR WHEEZING OR SHORTNESS OF BREATH, Disp: 75 mL, Rfl: 0 .  atenolol (TENORMIN) 25 MG tablet, TAKE 1 TABLET(25 MG) BY MOUTH TWICE DAILY, Disp: 60 tablet, Rfl: 5 .  atorvastatin (LIPITOR) 10 MG tablet, TAKE 1 TABLET BY MOUTH EVERY NIGHT AT BEDTIME, Disp: 90 tablet, Rfl: 3 .  Cholecalciferol (VITAMIN D3) 2000 units capsule, Take 1 capsule (2,000 Units total) by mouth daily., Disp: 30 capsule, Rfl: 12 .  clopidogrel (PLAVIX) 75 MG tablet, Take 1 tablet (75 mg total) by mouth daily. Please resume on Friday 09/12/2017, Disp: 30 tablet, Rfl: 5 .  FEROSUL 325 (65 Fe) MG tablet, TAKE 1 TABLET(325 MG) BY MOUTH DAILY, Disp: 30 tablet, Rfl: 5 .  gabapentin (NEURONTIN) 300 MG capsule, Take 1 capsule (300 mg total) by mouth at bedtime., Disp: 90 capsule, Rfl: 1 .  [START ON 03/25/2018] HYDROcodone-acetaminophen (NORCO/VICODIN) 5-325 MG tablet, Take 1 tablet by mouth every 8 (eight) hours as needed for severe pain., Disp: 90 tablet, Rfl: 0 .  methocarbamol (ROBAXIN) 750 MG tablet, Take 1 tablet (750 mg total) by mouth 3 (three) times daily  as needed for muscle spasms., Disp: 90 tablet, Rfl: 2 .  OXYGEN, Inhale 2 L/min into the lungs. Use via nasal cannula at night and during the day as needed, Disp: , Rfl:  .  polyethylene glycol powder (GLYCOLAX/MIRALAX) powder, MIX AND TAKE 17 GRAMS BY MOUTH prn, Disp: , Rfl: 5 .  predniSONE (DELTASONE) 5 MG tablet, TAKE 1 TABLET(5 MG) BY MOUTH DAILY WITH BREAKFAST, Disp: 30 tablet, Rfl: 0  ROS  Constitutional: Denies any fever or chills Gastrointestinal: No reported hemesis, hematochezia, vomiting, or acute GI distress Musculoskeletal: Denies any acute onset joint swelling, redness, loss of ROM, or weakness Neurological: No reported episodes of acute onset apraxia, aphasia, dysarthria, agnosia, amnesia, paralysis, loss of coordination, or loss of consciousness  Allergies  Ms. Shugars has No Known Allergies.  PFSH  Drug: Ms. Zemaitis  reports that she does not use drugs. Alcohol:  reports that she does not drink alcohol. Tobacco:  reports that she has been smoking cigarettes. She has a 15.25 pack-year smoking history. She has never used smokeless tobacco. Medical:  has a past medical history of Aneurysm of iliac artery ([place]) (05/11/2015), COPD (chronic obstructive pulmonary disease) ([place]), Cystitis, History of DVT (deep vein thrombosis) (05/11/2015), History of peptic ulcer disease (05/11/2015), [name], Hypertension, Losing weight, Migraines, [name], Rectal bleeding, Stroke ([place]), and Vascular disease. Surgical: Ms. Bently  has a past surgical history that includes Partial hysterectomy; [name]; [name], 2008); Back surgery; Colonoscopy (2012); Esophagogastroduodenoscopy (04/2013); Abdominal hysterectomy; Appendectomy; blood clot ; Hip Arthroplasty (Right, 06/17/2017); and Joint replacement (Right, 06/19/2017). Family: family history includes Brain cancer in her father; Colon  cancer in her brother; Kidney disease in her mother.  Constitutional Exam  General appearance: Well nourished, well developed, and well hydrated. In no apparent acute distress Vitals:   03/02/18 1359  BP: 133/76  Pulse: 87  Resp: 20  Temp: 98.1 F (36.7 C)  TempSrc: Oral  SpO2: 92%  Weight: 97 lb (44 kg)  Height: '5\' 6"'  (1.676 m)   BMI Assessment: Estimated body mass index is 15.66 kg/m as calculated from the following:   Height as of this encounter: '5\' 6"'  (1.676 m).   Weight as of this encounter: 97 lb (44 kg).  BMI interpretation table: BMI level Category Range association with higher incidence of chronic pain  <18 kg/m2 Underweight   18.5-24.9 kg/m2 Ideal body weight   25-29.9 kg/m2 Overweight Increased incidence by 20%  30-34.9 kg/m2 Obese (Class I) Increased incidence by 68%  35-39.9 kg/m2 Severe obesity (Class II) Increased incidence by 136%  >40 kg/m2 Extreme obesity (Class III) Increased incidence by 254%   Patient's current BMI Ideal Body weight  Body mass index is 15.66 kg/m. Female patients must weigh at least 45.5 kg to calculate ideal body weight   BMI Readings from Last 4 Encounters:  03/02/18 15.66 kg/m  02/03/18 15.66 kg/m  01/20/18 16.14 kg/m  12/23/17 16.46 kg/m   Wt Readings from Last 4 Encounters:  03/02/18 97 lb (44 kg)  02/03/18 97 lb (44 kg)  01/20/18 97 lb (44 kg)  12/23/17 102 lb (46.3 kg)  Psych/Mental status: Alert, oriented x 3 (person, place, & time)       Eyes: PERLA Respiratory: No evidence of acute respiratory distress  Lumbar Spine Area Exam  Skin & Axial Inspection: No masses, redness, or swelling Alignment: Symmetrical Functional ROM: Unrestricted ROM       Stability: No instability detected Muscle Tone/Strength: Functionally intact. No obvious neuro-muscular anomalies detected. Sensory (Neurological): Unimpaired Palpation: Tender       Provocative Tests: Hyperextension/rotation test: deferred today       Lumbar quadrant  test (Kemp's test): deferred today       Lateral bending test: deferred today       Patrick's Maneuver: deferred today                   FABER test: deferred today                   S-I anterior distraction/compression test: deferred today         S-I lateral compression test: deferred today         S-I Thigh-thrust test: deferred today         S-I Gaenslen's test: deferred today          Gait & Posture Assessment  Ambulation: Patient ambulates using a walker Gait: Relatively normal for age and body habitus Posture: WNL   Lower Extremity Exam    Side: Right lower extremity  Side: Left lower extremity  Stability: No instability observed          Stability: No instability observed          Skin & Extremity Inspection: Skin color, temperature, and hair growth are WNL. No peripheral edema or cyanosis. No masses, redness, swelling, asymmetry, or associated skin lesions. No contractures.  Skin & Extremity Inspection: Skin color, temperature, and hair growth are WNL. No peripheral edema or cyanosis. No masses, redness, swelling, asymmetry, or associated skin lesions. No contractures.  Functional ROM: Unrestricted ROM  Functional ROM: Unrestricted ROM                  Muscle Tone/Strength: Functionally intact. No obvious neuro-muscular anomalies detected.  Muscle Tone/Strength: Functionally intact. No obvious neuro-muscular anomalies detected.  Sensory (Neurological): Unimpaired  Sensory (Neurological): Unimpaired  Palpation: No palpable anomalies  Palpation: No palpable anomalies   Assessment  Primary Diagnosis & Pertinent Problem List: The primary encounter diagnosis was Lumbar facet syndrome (Bilateral). Diagnoses of Chronic lower extremity pain (Location of Primary Source of Pain) (Right), Chronic lower extremity pain (Bilateral) (R>L), and Chronic pain syndrome were also pertinent to this visit.  Status Diagnosis  Controlled Controlled Controlled 1. Lumbar facet syndrome  (Bilateral)   2. Chronic lower extremity pain (Location of Primary Source of Pain) (Right)   3. Chronic lower extremity pain (Bilateral) (R>L)   4. Chronic pain syndrome     Problems updated and reviewed during this visit: Problem  Chronic Pain Syndrome  Chronic Bilateral Low Back Pain  Bilateral Leg Numbness  Bilateral Leg Weakness  Accidental Medication Overdose   Plan of Care  Pharmacotherapy (Medications Ordered): No orders of the defined types were placed in this encounter.  New Prescriptions   No medications on file   Medications administered today: Hartley F. Ebersole had no medications administered during this visit. Lab-work, procedure(s), and/or referral(s): No orders of the defined types were placed in this encounter.  Imaging and/or referral(s): None  Interventional therapies: Planned, scheduled, and/or pending:   Not at this time.  Consider additional treatment at next follow-up appointment   Considering:  Diagnostic right L4-5 transforaminal epidural steroid injection  Diagnostic right L4-5 lumbar epidural steroid injection  Diagnostic bilateral lumbar facet block  Possible bilateral lumbar facet radiofrequency ablation    Palliative PRN treatment(s):  Diagnostic right L4-5 transforaminal epidural steroid injection Diagnostic right L4-5 lumbar epidural steroid injection  Diagnostic bilateral lumbar facet block     Provider-requested follow-up: Return for Appointment As Scheduled.  Future Appointments  Date Time Provider [place]  03/11/2018  1:30 PM [place] ADVISOR [facility] None  03/20/2018 10:00 AM [facility] VASC 1 [facility] None  03/20/2018 10:30 AM [facility] VASC 1 [facility] None  03/20/2018 11:30 AM Dew, Erskine Squibb, MD [facility] None  03/23/2018  2:45 PM Glean Hess, MD [facility] None  04/22/2018  1:30 PM Vevelyn Francois, NP [facility] None   Primary Care Physician: Glean Hess, MD Location: [facility] Outpatient Pain Management  Facility Note by: Vevelyn Francois NP Date: 03/02/2018; Time: 4:17 PM  Pain Score Disclaimer: We use the NRS-11 scale. This is a self-reported, subjective measurement of pain severity with only modest accuracy. It is used primarily to identify changes within a particular patient. It must be understood that outpatient pain scales are significantly less accurate that those used for research, where they can be applied under ideal controlled circumstances with minimal exposure to variables. In reality, the score is likely to be a combination of pain intensity and pain affect, where pain affect describes the degree of emotional arousal or changes in action readiness caused by the sensory experience of pain. Factors such as social and work situation, setting, emotional state, anxiety levels, expectation, and prior pain experience may influence pain perception and show large inter-individual differences that may also be affected by time variables.  Patient instructions provided during this appointment: There are no Patient Instructions on file for this visit.

## 2018-03-02 NOTE — Progress Notes (Signed)
Safety precautions to be maintained throughout the outpatient stay will include: orient to surroundings, keep bed in low position, maintain call bell within reach at all times, provide assistance with transfer out of bed and ambulation.  

## 2018-03-11 ENCOUNTER — Ambulatory Visit: Payer: Medicare Other

## 2018-03-20 ENCOUNTER — Ambulatory Visit (INDEPENDENT_AMBULATORY_CARE_PROVIDER_SITE_OTHER): Payer: Medicare Other | Admitting: Vascular Surgery

## 2018-03-20 ENCOUNTER — Other Ambulatory Visit (INDEPENDENT_AMBULATORY_CARE_PROVIDER_SITE_OTHER): Payer: Medicare Other

## 2018-03-20 ENCOUNTER — Encounter: Payer: Medicare Other | Admitting: Internal Medicine

## 2018-03-20 ENCOUNTER — Encounter (INDEPENDENT_AMBULATORY_CARE_PROVIDER_SITE_OTHER): Payer: Medicare Other

## 2018-03-23 ENCOUNTER — Encounter: Payer: Self-pay | Admitting: Internal Medicine

## 2018-03-23 ENCOUNTER — Other Ambulatory Visit (INDEPENDENT_AMBULATORY_CARE_PROVIDER_SITE_OTHER): Payer: Self-pay | Admitting: Vascular Surgery

## 2018-03-23 NOTE — Progress Notes (Deleted)
Patient: Tasha Reyes, Female    DOB: [birth year], 84 y.o.   MRN: [id number] Visit Date: 03/23/2018  Today's Provider: Halina Maidens, MD   No chief complaint on file.  Subjective:    Annual wellness visit Tasha Reyes is a 84 y.o. female who presents today for her Subsequent Annual Wellness Visit. She feels {DESC; WELL/FAIRLY WELL/POORLY:18703}. She reports exercising ***. She reports she is sleeping {DESC; WELL/FAIRLY WELL/POORLY:18703}.   ----------------------------------------------------------- HPI  Review of Systems  Social History   Socioeconomic History  . Marital status: Widowed    Spouse name: Not on file  . Number of children: Not on file  . Years of education: Not on file  . Highest education level: Not on file  Occupational History  . Not on file  Social Needs  . Financial resource strain: Not on file  . Food insecurity:    Worry: Not on file    Inability: Not on file  . Transportation needs:    Medical: Not on file    Non-medical: Not on file  Tobacco Use  . Smoking status: Current Every Day Smoker    Packs/day: 0.25    Years: 61.00    Pack years: 15.25    Types: Cigarettes  . Smokeless tobacco: Never Used  . Tobacco comment: 3 cigrettes a day- cut back  Substance and Sexual Activity  . Alcohol use: No    Alcohol/week: 0.0 standard drinks  . Drug use: No  . Sexual activity: Never    Birth control/protection: Post-menopausal  Lifestyle  . Physical activity:    Days per week: Not on file    Minutes per session: Not on file  . Stress: Not on file  Relationships  . Social connections:    Talks on phone: Not on file    Gets together: Not on file    Attends religious service: Not on file    Active member of club or organization: Not on file    Attends meetings of clubs or organizations: Not on file    Relationship status: Not on file  . Intimate partner violence:    Fear of current or ex partner: Not on file    Emotionally abused:  Not on file    Physically abused: Not on file    Forced sexual activity: Not on file  Other Topics Concern  . Not on file  Social History Narrative  . Not on file    Patient Active Problem List   Diagnosis Date Noted  . Chronic pain syndrome 03/02/2018  . Chronic bilateral low back pain 02/24/2018  . Bilateral leg numbness 02/24/2018  . Bilateral leg weakness 02/24/2018  . Accidental medication overdose 02/22/2018  . DDD (degenerative disc disease), lumbar 02/03/2018  . Chronic anticoagulation (Plavix) 02/03/2018  . Chronic lower extremity pain (Bilateral) (R>L) 01/20/2018  . Disorder of skeletal system 01/20/2018  . Problems influencing health status 01/20/2018  . Pharmacologic therapy 01/20/2018  . Nausea 01/20/2018  . Chronic hip pain (Right) 10/21/2017  . Rectal bleeding 09/06/2017  . History of repair of hip fracture 06/16/2017  . AAA (abdominal aortic aneurysm) without rupture ([place]) 03/18/2017  . Edema due to malnutrition ([place]) 10/28/2016  . Dependence on supplemental oxygen 09/04/2016  . Protein-calorie malnutrition, severe 08/20/2016  . Vitamin D deficiency 01/10/2016  . Opioid-induced constipation ([facility]) 11/07/2015  . Osteoporosis, post-menopausal 11/07/2015  . Complaints of weakness of lower extremities (Bilateral) 11/07/2015  . Lumbar foraminal stenosis (Right) (Severe at L4-5) 06/20/2015  . Chronic  lower extremity pain (Location of Primary Source of Pain) (Right) 05/11/2015  . Lumbosacral Radiculopathy (Right) 05/11/2015  . Chronic radicular pain of lower extremity (Right) 05/11/2015  . Peripheral vascular disease ([place]) 05/11/2015  . Hx of long term use of blood thinners (Plavix) 05/11/2015  . Lower extremity pain (Right) 05/11/2015  . Neurogenic pain 05/11/2015  . History of DVT (deep vein thrombosis) 05/11/2015  . Lumbar facet syndrome (Bilateral) 05/11/2015  . Dyslipidemia 12/23/2014  . Compulsive tobacco user syndrome 12/23/2014  . Chronic peptic ulcer  with bleeding 12/23/2014  . Essential (primary) hypertension 12/23/2014  . Depression, major, single episode, complete remission ([place]) 12/23/2014  . COPD (chronic obstructive pulmonary disease) ([place]) 12/23/2014  . Restless leg 12/23/2014  . Chronic tension-type headache, intractable 12/23/2014    Past Surgical History:  Procedure Laterality Date  . ABDOMINAL AORTIC ANEURYSM REPAIR W/ ENDOLUMINAL GRAFT  2008  . ABDOMINAL HYSTERECTOMY    . APPENDECTOMY    . BACK SURGERY    . blood clot      removal right leg  . COLONOSCOPY  2012  . ESOPHAGOGASTRODUODENOSCOPY  04/2013   gastric ulcers  . FEMORAL-POPLITEAL BYPASS GRAFT Right 2008  . HIP ARTHROPLASTY Right 06/17/2017   Procedure: ARTHROPLASTY BIPOLAR HIP (HEMIARTHROPLASTY);  Surgeon: Earnestine Leys, MD;  Location: [facility] ORS;  Service: Orthopedics;  Laterality: Right;  . JOINT REPLACEMENT Right 06/19/2017   right hip FX.  Marland Kitchen PARTIAL HYSTERECTOMY      Her family history includes Brain cancer in her father; Colon cancer in her brother; Kidney disease in her mother.     No outpatient medications have been marked as taking for the 03/23/18 encounter (Appointment) with Glean Hess, MD.    Patient Care Team: Glean Hess, MD as PCP - General (Family Medicine) [facility] as Surgeon (Vascular Surgery) Manya Silvas, MD (Gastroenterology)       Objective:   Vitals: There were no vitals taken for this visit.  Physical Exam  Activities of Daily Living In your present state of health, do you have any difficulty performing the following activities: 09/06/2017 06/16/2017  Hearing? N -  Vision? N -  Difficulty concentrating or making decisions? N -  Walking or climbing stairs? Y -  Dressing or bathing? N -  Doing errands, shopping? Y N  Some recent data might be hidden    Fall Risk Assessment Fall Risk  03/02/2018 02/03/2018 10/21/2017 07/23/2017 04/30/2017  Falls in the past year? No No Yes Yes No  Number  falls in past yr: - - - 1 -  Injury with Fall? - - Yes Yes -  Comment - - broke right hip right hip fracture -  Risk Factor Category  - - - - -  Risk for fall due to : - - Impaired mobility Impaired balance/gait -  Risk for fall due to: Comment - - - got dizzy and fell -  Follow up - - - Falls prevention discussed -     Depression Screen PHQ 2/9 Scores 03/02/2018 02/03/2018 10/21/2017 08/01/2017  PHQ - 2 Score 0 0 0 0  PHQ- 9 Score - - - 0    6CIT Screen 03/19/2017  What Year? 0 points  What month? 0 points  What time? 0 points  Count back from 20 0 points  Months in reverse 0 points  Repeat phrase 2 points  Total Score 2    Medicare Annual Wellness Visit Summary:  Reviewed patient's Family Medical History Reviewed and  updated list of patient's medical providers Assessment of cognitive impairment was done Assessed patient's functional ability Established a written schedule for health screening [place] Completed and Reviewed  Exercise Activities and Dietary recommendations Goals   None     Immunization History  Administered Date(s) Administered  . Influenza,inj,Quad PF,6+ Mos 03/19/2017  . Pneumococcal Conjugate-13 10/13/2015  . Pneumococcal Polysaccharide-23 01/18/2014    Health Maintenance  Topic Date Due  . TETANUS/TDAP  07/28/1951  . DEXA SCAN  07/27/1997  . INFLUENZA VACCINE  02/12/2018  . PNA vac Low Risk Adult  Completed    Discussed health benefits of physical activity, and encouraged her to engage in regular exercise appropriate for her age and condition.    ------------------------------------------------------------------------------------------------------------  Assessment & Plan:     There are no diagnoses linked to this encounter.

## 2018-04-17 ENCOUNTER — Ambulatory Visit (INDEPENDENT_AMBULATORY_CARE_PROVIDER_SITE_OTHER): Payer: Medicare Other

## 2018-04-17 ENCOUNTER — Other Ambulatory Visit (INDEPENDENT_AMBULATORY_CARE_PROVIDER_SITE_OTHER): Payer: Self-pay | Admitting: Vascular Surgery

## 2018-04-17 ENCOUNTER — Encounter (INDEPENDENT_AMBULATORY_CARE_PROVIDER_SITE_OTHER): Payer: Self-pay | Admitting: Vascular Surgery

## 2018-04-17 ENCOUNTER — Ambulatory Visit (INDEPENDENT_AMBULATORY_CARE_PROVIDER_SITE_OTHER): Payer: Medicare Other | Admitting: Vascular Surgery

## 2018-04-17 VITALS — BP 170/85 | HR 66 | Resp 17 | Ht 66.0 in | Wt 100.0 lb

## 2018-04-17 DIAGNOSIS — I714 Abdominal aortic aneurysm, without rupture, unspecified: Secondary | ICD-10-CM

## 2018-04-17 DIAGNOSIS — Z95828 Presence of other vascular implants and grafts: Secondary | ICD-10-CM

## 2018-04-17 DIAGNOSIS — I739 Peripheral vascular disease, unspecified: Secondary | ICD-10-CM

## 2018-04-17 DIAGNOSIS — F17210 Nicotine dependence, cigarettes, uncomplicated: Secondary | ICD-10-CM

## 2018-04-17 DIAGNOSIS — I10 Essential (primary) hypertension: Secondary | ICD-10-CM

## 2018-04-17 NOTE — Assessment & Plan Note (Signed)
Duplex shows the aneurysm to be only 2.9 cm in maximal diameter without leak.  Her femoral to femoral bypass is patent.  The right iliac limb is occluded which is known.  No current issues.  Recheck in 1 year

## 2018-04-17 NOTE — Patient Instructions (Signed)
Abdominal Aortic Aneurysm Blood pumps away from the heart through tubes (blood vessels) called arteries. Aneurysms are weak or damaged places in the wall of an artery. It bulges out like a balloon. An abdominal aortic aneurysm happens in the main artery of the body (aorta). It can burst or tear, causing bleeding inside the body. This is an emergency. It needs treatment right away. What are the causes? The exact cause is unknown. Things that could cause this problem include:  Fat and other substances building up in the lining of a tube.  Swelling of the walls of a blood vessel.  Certain tissue diseases.  Belly (abdominal) trauma.  An infection in the main artery of the body.  What increases the risk? There are things that make it more likely for you to have an aneurysm. These include:  Being over the age of 82 years old.  Having high blood pressure (hypertension).  Being a female.  Being white.  Being very overweight (obese).  Having a family history of aneurysm.  Using tobacco products.  What are the signs or symptoms? Symptoms depend on the size of the aneurysm and how fast it grows. There may not be symptoms. If symptoms occur, they can include:  Pain (belly, side, lower back, or groin).  Feeling full after eating a small amount of food.  Feeling sick to your stomach (nauseous), throwing up (vomiting), or both.  Feeling a lump in your belly that feels like it is beating (pulsating).  Feeling like you will pass out (faint).  How is this treated?  Medicine to control blood pressure and pain.  Imaging tests to see if the aneurysm gets bigger.  Surgery. How is this prevented? To lessen your chance of getting this condition:  Stop smoking. Stop chewing tobacco.  Limit or avoid alcohol.  Keep your blood pressure, blood sugar, and cholesterol within normal limits.  Eat less salt.  Eat foods low in saturated fats and cholesterol. These are found in animal and  whole dairy products.  Eat more fiber. Fiber is found in whole grains, vegetables, and fruits.  Keep a healthy weight.  Stay active and exercise often.  This information is not intended to replace advice given to you by your health care provider. Make sure you discuss any questions you have with your health care provider. Document Released: 10/26/2012 Document Revised: 12/07/2015 Document Reviewed: 07/31/2012 Elsevier Interactive Patient Education  2017 Elsevier Inc.  

## 2018-04-17 NOTE — Progress Notes (Signed)
MRN : [id number]  Tasha Reyes is a 84 y.o. (11-Feb-1931) female who presents with chief complaint of  Chief Complaint  Patient presents with  . Follow-up    1 year ABI and EVAR  .  History of Present Illness: Patient returns today in follow up of previous aneurysm repair and femoral to femoral bypass.  She fell and broke her hip and is now walking with a walker and this is really thrown her for a loop.  She does not have any obvious aneurysm related symptoms.  She is having some swelling particularly in the right leg which was the hip she broke.  No ulceration or infection.  Noninvasive studies today show a 2.9 cm abdominal aortic aneurysm without an endoleak with a stent graft present.  The right limb of the graft is chronically occluded but her femoral to femoral bypass is widely patent and her ABIs are normal bilaterally.  Current Outpatient Medications  Medication Sig Dispense Refill  . hydrochlorothiazide (HYDRODIURIL) 12.5 MG tablet Take by mouth.    Marland Kitchen albuterol (PROVENTIL) (2.5 MG/3ML) 0.083% nebulizer solution USE 1 VIAL VIA NEBULIZER EVERY 4 HOURS AS NEEDED FOR WHEEZING OR SHORTNESS OF BREATH 75 mL 0  . atenolol (TENORMIN) 25 MG tablet TAKE 1 TABLET(25 MG) BY MOUTH TWICE DAILY 60 tablet 5  . atorvastatin (LIPITOR) 10 MG tablet TAKE 1 TABLET BY MOUTH EVERY NIGHT AT BEDTIME 90 tablet 3  . Cholecalciferol (VITAMIN D3) 2000 units capsule Take 1 capsule (2,000 Units total) by mouth daily. 30 capsule 12  . clopidogrel (PLAVIX) 75 MG tablet TAKE 1 TABLET BY MOUTH DAILY 90 tablet 4  . FEROSUL 325 (65 Fe) MG tablet TAKE 1 TABLET(325 MG) BY MOUTH DAILY 30 tablet 5  . gabapentin (NEURONTIN) 300 MG capsule Take 1 capsule (300 mg total) by mouth at bedtime. 90 capsule 1  . HYDROcodone-acetaminophen (NORCO/VICODIN) 5-325 MG tablet Take 1 tablet by mouth every 8 (eight) hours as needed for severe pain. 90 tablet 0  . methocarbamol (ROBAXIN) 750 MG tablet Take 1 tablet (750 mg total) by  mouth 3 (three) times daily as needed for muscle spasms. 90 tablet 2  . mirtazapine (REMERON) 15 MG tablet   5  . OXYGEN Inhale 2 L/min into the lungs. Use via nasal cannula at night and during the day as needed    . polyethylene glycol powder (GLYCOLAX/MIRALAX) powder MIX AND TAKE 17 GRAMS BY MOUTH prn  5  . predniSONE (DELTASONE) 5 MG tablet TAKE 1 TABLET(5 MG) BY MOUTH DAILY WITH BREAKFAST 30 tablet 0   No current facility-administered medications for this visit.     Past Medical History:  Diagnosis Date  . Aneurysm of iliac artery (HCC) 05/11/2015  . COPD (chronic obstructive pulmonary disease) ([place])   . Cystitis   . History of DVT (deep vein thrombosis) 05/11/2015  . History of peptic ulcer disease 05/11/2015  . [name]   . Hypertension   . Losing weight   . Migraines   . [name]   . Rectal bleeding   . Stroke ([place])   . Vascular disease     Past Surgical History:  Procedure Laterality Date  . ABDOMINAL AORTIC ANEURYSM REPAIR W/ ENDOLUMINAL GRAFT  2008  . ABDOMINAL HYSTERECTOMY    . APPENDECTOMY    . BACK SURGERY    . blood clot      removal right leg  . COLONOSCOPY  2012  . ESOPHAGOGASTRODUODENOSCOPY  04/2013   gastric  ulcers  . FEMORAL-POPLITEAL BYPASS GRAFT Right 2008  . HIP ARTHROPLASTY Right 06/17/2017   Procedure: ARTHROPLASTY BIPOLAR HIP (HEMIARTHROPLASTY);  Surgeon: Earnestine Leys, MD;  Location: [facility] ORS;  Service: Orthopedics;  Laterality: Right;  . JOINT REPLACEMENT Right 06/19/2017   right hip FX.  Marland Kitchen PARTIAL HYSTERECTOMY      Social History  Substance Use Topics  . Smoking status: Current Every Day Smoker    Packs/day: 0.50    Years: 61.00    Types: Cigarettes  . Smokeless tobacco: Never Used  . Alcohol use No    Family History Family History  Problem Relation Age of Onset  . Brain cancer Father   . Kidney disease Mother     No Known Allergies   REVIEW OF SYSTEMS (Negative unless  checked)  Constitutional: [x] Weight loss  [] Fever  [] Chills Cardiac: [] Chest pain   [] Chest pressure   [] Palpitations   [] Shortness of breath when laying flat   [] Shortness of breath at rest   [x] Shortness of breath with exertion. Vascular:  [x] Pain in legs with walking   [] Pain in legs at rest   [] Pain in legs when laying flat   [] Claudication   [] Pain in feet when walking  [] Pain in feet at rest  [] Pain in feet when laying flat   [] History of DVT   [] Phlebitis   [] Swelling in legs   [] Varicose veins   [] Non-healing ulcers Pulmonary:   [] Uses home oxygen   [] Productive cough   [] Hemoptysis   [] Wheeze  [x] COPD   [] Asthma Neurologic:  [] Dizziness  [] Blackouts   [] Seizures   [x] History of stroke   [] History of TIA  [] Aphasia   [] Temporary blindness   [] Dysphagia   [] Weakness or numbness in arms   [] Weakness or numbness in legs Musculoskeletal:  [x] Arthritis   [] Joint swelling   [] Joint pain   [] Low back pain Hematologic:  [] Easy bruising  [] Easy bleeding   [] Hypercoagulable state   [] Anemic   Gastrointestinal:  [] Blood in stool   [] Vomiting blood  [] Gastroesophageal reflux/heartburn   [] Abdominal pain Genitourinary:  [] Chronic kidney disease   [] Difficult urination  [] Frequent urination  [] Burning with urination   [] Hematuria Skin:  [] Rashes   [] Ulcers   [] Wounds Psychological:  [] History of anxiety   []  History of major depression.    Physical Examination  BP (!) 170/85 (BP Location: Left Arm, Patient Position: Sitting)   Pulse 66   Resp 17   Ht 5\' 6"  (1.676 m)   Wt 100 lb (45.4 kg)   BMI 16.14 kg/m  Gen:  NAD. Thin and debilitated appearing Head: [place]/AT, No temporalis wasting. Ear/Nose/Throat: Hearing decreased, nares w/o erythema or drainage Eyes: Conjunctiva clear. Sclera non-icteric Neck: Supple.  Trachea midline Pulmonary:  Good air movement, no use of accessory muscles.  Cardiac: RRR, no JVD Vascular:  Vessel Right Left  Radial Palpable Palpable                           PT Not Palpable Trace Palpable  DP 2+ Palpable 2+ Palpable    Musculoskeletal: M/S 5/5 throughout. Walking with a walker. 1+ RLE edema. Neurologic: Sensation grossly intact in extremities.  Symmetrical.  Speech is fluent.  Psychiatric: Judgment intact, Mood & affect appropriate for pt's clinical situation. Dermatologic: No rashes or ulcers noted.  No cellulitis or open wounds.       Labs Recent Results (from the past 2160 hour(s))  Comp. Metabolic Panel (12)  Status: Abnormal   Collection Time: 01/20/18  3:51 PM  Result Value Ref Range   Glucose 95 65 - 99 mg/dL   BUN 9 8 - 27 mg/dL   Creatinine, Ser 0.86 0.57 - 1.00 mg/dL   GFR calc non Af Amer 62 >59 mL/min/1.73   GFR calc Af Amer 71 >59 mL/min/1.73   BUN/Creatinine Ratio 10 (L) 12 - 28   Sodium 132 (L) 134 - 144 mmol/L   Potassium 4.7 3.5 - 5.2 mmol/L   Chloride 93 (L) 96 - 106 mmol/L   Calcium 9.5 8.7 - 10.3 mg/dL   Total Protein 7.3 6.0 - 8.5 g/dL   Albumin 4.4 3.5 - 4.7 g/dL   Globulin, Total 2.9 1.5 - 4.5 g/dL   Albumin/Globulin Ratio 1.5 1.2 - 2.2   Bilirubin Total 0.3 0.0 - 1.2 mg/dL   Alkaline Phosphatase 78 39 - 117 IU/L   AST 13 0 - 40 IU/L  Magnesium     Status: None   Collection Time: 01/20/18  3:51 PM  Result Value Ref Range   Magnesium 1.8 1.6 - 2.3 mg/dL  Vitamin B12     Status: None   Collection Time: 01/20/18  3:51 PM  Result Value Ref Range   Vitamin B-12 923 232 - 1,245 pg/mL  Sedimentation rate     Status: None   Collection Time: 01/20/18  3:51 PM  Result Value Ref Range   Sed Rate 23 0 - 40 mm/hr  25-Hydroxyvitamin D Lcms D2+D3     Status: None   Collection Time: 01/20/18  3:51 PM  Result Value Ref Range   25-Hydroxy, Vitamin D 41 ng/mL    Comment: Reference Range: All Ages: Target levels 30 - 100    25-Hydroxy, Vitamin D-2 13 ng/mL   25-Hydroxy, Vitamin D-3 28 ng/mL  C-reactive protein     Status: None   Collection Time: 01/20/18  3:51 PM  Result Value Ref Range   CRP 5 0 - 10  mg/L    Comment:               **Please note reference interval change**    Radiology No results found.  Assessment/Plan Essential (primary) hypertension blood pressure control important in reducing the progression of atherosclerotic disease. On appropriate oral medications.  AAA (abdominal aortic aneurysm) without rupture (HCC) Duplex shows the aneurysm to be only 2.9 cm in maximal diameter without leak.  Her femoral to femoral bypass is patent.  The right iliac limb is occluded which is known.  No current issues.  Recheck in 1 year  Peripheral vascular disease ([place]) ABIs today are 0.99 on the right and 0.94 on the left with biphasic waveforms.  Her femoral to femoral bypass is patent.  Blood flow is well preserved at this point.  Recheck in 1 year    Leotis Pain, MD  04/17/2018 10:09 AM    This note was created with Dragon medical transcription system.  Any errors from dictation are purely unintentional

## 2018-04-17 NOTE — Assessment & Plan Note (Signed)
ABIs today are 0.99 on the right and 0.94 on the left with biphasic waveforms.  Her femoral to femoral bypass is patent.  Blood flow is well preserved at this point.  Recheck in 1 year

## 2018-04-22 ENCOUNTER — Other Ambulatory Visit: Payer: Self-pay

## 2018-04-22 ENCOUNTER — Encounter: Payer: Self-pay | Admitting: Nurse Practitioner

## 2018-04-22 ENCOUNTER — Ambulatory Visit: Payer: Medicare Other | Attending: Nurse Practitioner | Admitting: Nurse Practitioner

## 2018-04-22 ENCOUNTER — Telehealth: Payer: Self-pay | Admitting: *Deleted

## 2018-04-22 VITALS — BP 176/84 | HR 86 | Temp 97.7°F | Resp 20 | Ht 66.0 in | Wt 100.0 lb

## 2018-04-22 DIAGNOSIS — G2581 Restless legs syndrome: Secondary | ICD-10-CM | POA: Diagnosis not present

## 2018-04-22 DIAGNOSIS — I10 Essential (primary) hypertension: Secondary | ICD-10-CM | POA: Diagnosis not present

## 2018-04-22 DIAGNOSIS — F329 Major depressive disorder, single episode, unspecified: Secondary | ICD-10-CM | POA: Insufficient documentation

## 2018-04-22 DIAGNOSIS — E559 Vitamin D deficiency, unspecified: Secondary | ICD-10-CM | POA: Diagnosis not present

## 2018-04-22 DIAGNOSIS — M5136 Other intervertebral disc degeneration, lumbar region: Secondary | ICD-10-CM | POA: Insufficient documentation

## 2018-04-22 DIAGNOSIS — M48061 Spinal stenosis, lumbar region without neurogenic claudication: Secondary | ICD-10-CM | POA: Insufficient documentation

## 2018-04-22 DIAGNOSIS — G894 Chronic pain syndrome: Secondary | ICD-10-CM | POA: Diagnosis not present

## 2018-04-22 DIAGNOSIS — M7918 Myalgia, other site: Secondary | ICD-10-CM

## 2018-04-22 DIAGNOSIS — Z96641 Presence of right artificial hip joint: Secondary | ICD-10-CM | POA: Diagnosis not present

## 2018-04-22 DIAGNOSIS — G44221 Chronic tension-type headache, intractable: Secondary | ICD-10-CM | POA: Insufficient documentation

## 2018-04-22 DIAGNOSIS — M5410 Radiculopathy, site unspecified: Secondary | ICD-10-CM

## 2018-04-22 DIAGNOSIS — Z79899 Other long term (current) drug therapy: Secondary | ICD-10-CM | POA: Insufficient documentation

## 2018-04-22 DIAGNOSIS — I739 Peripheral vascular disease, unspecified: Secondary | ICD-10-CM | POA: Insufficient documentation

## 2018-04-22 DIAGNOSIS — E785 Hyperlipidemia, unspecified: Secondary | ICD-10-CM | POA: Diagnosis not present

## 2018-04-22 DIAGNOSIS — F17210 Nicotine dependence, cigarettes, uncomplicated: Secondary | ICD-10-CM | POA: Diagnosis not present

## 2018-04-22 DIAGNOSIS — Z8711 Personal history of peptic ulcer disease: Secondary | ICD-10-CM | POA: Insufficient documentation

## 2018-04-22 DIAGNOSIS — I714 Abdominal aortic aneurysm, without rupture: Secondary | ICD-10-CM | POA: Insufficient documentation

## 2018-04-22 DIAGNOSIS — Z8673 Personal history of transient ischemic attack (TIA), and cerebral infarction without residual deficits: Secondary | ICD-10-CM | POA: Diagnosis not present

## 2018-04-22 DIAGNOSIS — G8929 Other chronic pain: Secondary | ICD-10-CM

## 2018-04-22 DIAGNOSIS — K625 Hemorrhage of anus and rectum: Secondary | ICD-10-CM | POA: Insufficient documentation

## 2018-04-22 DIAGNOSIS — Z9981 Dependence on supplemental oxygen: Secondary | ICD-10-CM | POA: Diagnosis not present

## 2018-04-22 DIAGNOSIS — Z681 Body mass index (BMI) 19 or less, adult: Secondary | ICD-10-CM | POA: Insufficient documentation

## 2018-04-22 DIAGNOSIS — M5417 Radiculopathy, lumbosacral region: Secondary | ICD-10-CM

## 2018-04-22 DIAGNOSIS — Z7902 Long term (current) use of antithrombotics/antiplatelets: Secondary | ICD-10-CM | POA: Diagnosis not present

## 2018-04-22 DIAGNOSIS — M810 Age-related osteoporosis without current pathological fracture: Secondary | ICD-10-CM | POA: Insufficient documentation

## 2018-04-22 DIAGNOSIS — Z86718 Personal history of other venous thrombosis and embolism: Secondary | ICD-10-CM | POA: Diagnosis not present

## 2018-04-22 DIAGNOSIS — G43909 Migraine, unspecified, not intractable, without status migrainosus: Secondary | ICD-10-CM | POA: Diagnosis not present

## 2018-04-22 DIAGNOSIS — K5900 Constipation, unspecified: Secondary | ICD-10-CM | POA: Diagnosis not present

## 2018-04-22 DIAGNOSIS — J449 Chronic obstructive pulmonary disease, unspecified: Secondary | ICD-10-CM | POA: Insufficient documentation

## 2018-04-22 DIAGNOSIS — E43 Unspecified severe protein-calorie malnutrition: Secondary | ICD-10-CM | POA: Insufficient documentation

## 2018-04-22 DIAGNOSIS — E7800 Pure hypercholesterolemia, unspecified: Secondary | ICD-10-CM | POA: Diagnosis not present

## 2018-04-22 DIAGNOSIS — M792 Neuralgia and neuritis, unspecified: Secondary | ICD-10-CM

## 2018-04-22 DIAGNOSIS — M25551 Pain in right hip: Secondary | ICD-10-CM

## 2018-04-22 MED ORDER — METHOCARBAMOL 750 MG PO TABS: 750.0000 mg | ORAL_TABLET | Freq: Three times a day (TID) | ORAL | 2 refills | Status: DC | PRN

## 2018-04-22 MED ORDER — HYDROCODONE-ACETAMINOPHEN 5-325 MG PO TABS: 1.0000 | ORAL_TABLET | Freq: Three times a day (TID) | ORAL | 0 refills | Status: DC | PRN

## 2018-04-22 MED ORDER — GABAPENTIN 300 MG PO CAPS: 300.0000 mg | ORAL_CAPSULE | Freq: Every day | ORAL | 1 refills | Status: DC

## 2018-04-22 NOTE — Telephone Encounter (Signed)
-----   Message from Vevelyn Francois, NP sent at 04/22/2018  2:15 PM EDT ----- Regarding: Check up  Just check to see if she went to urgent care and if they treated her for the cough and the peripheral edema. Thanks

## 2018-04-22 NOTE — Progress Notes (Signed)
Patient's Name: Tasha Reyes  MRN: [id number]  Referring Provider: Glean Hess, MD  DOB: 09-07-1930  PCP: Glean Hess, MD  DOS: 04/22/2018  Note by: Vevelyn Francois NP  Service setting: Ambulatory outpatient  Specialty: Interventional Pain Management  Location: [facility] (AMB) Pain Management Facility    Patient type: Established    Primary Reason(s) for Visit: Encounter for prescription drug management. (Level of risk: moderate)  CC: Medication Refill (Hydrocodone-Acet. 37-325)  HPI  Ms. Shuart is a 84 y.o. year old, female patient, who comes today for a medication management evaluation. She has Dyslipidemia; Compulsive tobacco user syndrome; History of bleeding peptic ulcer; Essential (primary) hypertension; Depression, major, single episode, complete remission ([place]); COPD (chronic obstructive pulmonary disease) ([place]); Restless leg; Chronic tension-type headache, intractable; Chronic lower extremity pain (Location of Primary Source of Pain) (Right); Lumbosacral Radiculopathy (Right); Chronic radicular pain of lower extremity (Right); Peripheral vascular disease ([place]); Hx of long term use of blood thinners (Plavix); Lower extremity pain (Right); Neurogenic pain; History of DVT (deep vein thrombosis); Lumbar facet syndrome (Bilateral); Lumbar foraminal stenosis (Right) (Severe at L4-5); Opioid-induced constipation ([place]); Osteoporosis, post-menopausal; Complaints of weakness of lower extremities (Bilateral); Vitamin D deficiency; Protein-calorie malnutrition, severe; Dependence on supplemental oxygen; Edema due to malnutrition [facility]); AAA (abdominal aortic aneurysm) without rupture ([place]); History of repair of hip fracture; Rectal bleeding; Chronic hip pain (Right); Chronic lower extremity pain (Bilateral) (R>L); Disorder of skeletal system; DDD (degenerative disc disease), lumbar; Chronic anticoagulation (Plavix); Accidental medication overdose; Chronic bilateral low back pain; Bilateral leg  numbness; and Chronic pain syndrome on their problem list. Her primarily concern today is the Medication Refill (Hydrocodone-Acet. 5-325)  Pain Assessment: Location: Lower, Right, Left Back Radiating: Radiated from lower back to nack of legs to feet bilateral  Onset: More than a month ago Duration: Chronic pain Quality: Constant, Burning, Aching, Tingling Severity: 5 /10 (subjective, self-reported pain score)  Note: Reported level is compatible with observation.                          Effect on ADL: "Slows me down a lot"  Timing: Constant Modifying factors: Medications  BP: (!) 176/84(has not taken BP med today)  HR: 86  Ms. Petko was last scheduled for an appointment on 03/02/2018 for medication management. During today's appointment we reviewed Ms. Holst's chronic pain status, as well as her outpatient medication regimen. She mitts that she continues to have lower back pain.  She admits that her current swelling in her feet is also painful.  She has not recently seen her primary care provider.  She denies any current side effects of her pain regimen.  The patient  reports that she does not use drugs. Her body mass index is 16.14 kg/m.  Further details on both, my assessment(s), as well as the proposed treatment plan, please see below.  Controlled Substance Pharmacotherapy Assessment REMS (Risk Evaluation and Mitigation Strategy)  Analgesic:Hydrocodone/APAP 5/325 one tablet every 8 hours (15 mg/day) MME/day:15 mg/day.   Janne Napoleon, RN  04/22/2018  1:29 PM  Sign at close encounter Nursing Pain Medication Assessment:  Safety precautions to be maintained throughout the outpatient stay will include: orient to surroundings, keep bed in low position, maintain call bell within reach at all times, provide assistance with transfer out of bed and ambulation.   Medication Inspection Compliance: Pill count conducted under aseptic conditions, in front of the patient. Neither the  pills nor the bottle was  removed from the patient's sight at any time. Once count was completed pills were immediately returned to the patient in their original bottle.  Medication: Hydrocodone/APAP Pill/Patch Count: 10 of 90 pills remain Pill/Patch Appearance: Markings consistent with prescribed medication Bottle Appearance: Standard pharmacy container. Clearly labeled. Filled Date: 09 / 14 / 2019 Last Medication intake:  Today   Pharmacokinetics: Liberation and absorption (onset of action): WNL Distribution (time to peak effect): WNL Metabolism and excretion (duration of action): WNL         Pharmacodynamics: Desired effects: Analgesia: Ms. Babson reports >50% benefit. Functional ability: Patient reports that medication allows her to accomplish basic ADLs Clinically meaningful improvement in function (CMIF): Sustained CMIF goals met Perceived effectiveness: Described as relatively effective, allowing for increase in activities of daily living (ADL) Undesirable effects: Side-effects or Adverse reactions: None reported Monitoring: [place] PMP: Online review of the past 39-monthperiod conducted. Compliant with practice rules and regulations Last UDS on record: Summary  Date Value Ref Range Status  10/21/2017 FINAL  Final    Comment:    ==================================================================== TOXASSURE SELECT 13 (MW) ==================================================================== Test                             Result       Flag       Units Drug Present not Declared for Prescription Verification   Hydrocodone                    1014         UNEXPECTED ng/mg creat   Hydromorphone                  211          UNEXPECTED ng/mg creat   Dihydrocodeine                 221          UNEXPECTED ng/mg creat   Norhydrocodone                 1450         UNEXPECTED ng/mg creat    Sources of hydrocodone include scheduled prescription    medications. Hydromorphone,  dihydrocodeine and norhydrocodone are    expected metabolites of hydrocodone. Hydromorphone and    dihydrocodeine are also available as scheduled prescription    medications. ==================================================================== Test                      Result    Flag   Units      Ref Range   Creatinine              180              mg/dL      >=20 ==================================================================== Declared Medications:  The flagging and interpretation on this report are based on the  following declared medications.  Unexpected results may arise from  inaccuracies in the declared medications.  **Note: The testing scope of this panel does not include following  reported medications:  Atenolol  Atorvastatin  Cholecalciferol  Clopidogrel  Iron (Ferrous Sulfate)  Methocarbamol  Polyethylene Glycol  Prednisone ==================================================================== For clinical consultation, please call [phone] ====================================================================    UDS interpretation: Compliant          UDS is appropriate patient on hydrocodone Medication Assessment Form: Reviewed. Patient indicates being compliant with therapy Treatment compliance: Compliant Risk Assessment Profile: Aberrant  behavior: See prior evaluations. None observed or detected today Comorbid factors increasing risk of overdose: See prior notes. No additional risks detected today Opioid risk tool (ORT) (Total Score): 3 Personal History of Substance Abuse (SUD-Substance use disorder):  Alcohol: Negative  Illegal Drugs: Negative  Rx Drugs: Negative  ORT Risk Level calculation: Low Risk Risk of substance use disorder (SUD): Low Opioid Risk Tool - 04/22/18 1339      Family History of Substance Abuse   Alcohol  Positive Female    Illegal Drugs  Positive Female    Rx Drugs  Negative      Personal History of Substance Abuse   Alcohol   Negative    Illegal Drugs  Negative    Rx Drugs  Negative      Age   Age between 20-45 years   No      History of Preadolescent Sexual Abuse   History of Preadolescent Sexual Abuse  Negative or Female      Psychological Disease   Psychological Disease  Negative    Depression  Negative      Total Score   Opioid Risk Tool Scoring  3    Opioid Risk Interpretation  Low Risk      ORT Scoring interpretation table:  Score <3 = Low Risk for SUD  Score between 4-7 = Moderate Risk for SUD  Score >8 = High Risk for Opioid Abuse   Risk Mitigation Strategies:  Patient Counseling: Covered Patient-Prescriber Agreement (PPA): Present and active  Notification to other healthcare providers: Done  Pharmacologic Plan: No change in therapy, at this time.             Laboratory Chemistry  Inflammation Markers (CRP: Acute Phase) (ESR: Chronic Phase) Lab Results  Component Value Date   CRP 5 01/20/2018   ESRSEDRATE 23 01/20/2018                         Rheumatology Markers No results found for: RF, ANA, LABURIC, URICUR, LYMEIGGIGMAB, LYMEABIGMQN, HLAB27                      Renal Function Markers Lab Results  Component Value Date   BUN 9 01/20/2018   CREATININE 0.86 01/20/2018   BCR 10 (L) 01/20/2018   GFRAA 71 01/20/2018   GFRNONAA 62 01/20/2018                             Hepatic Function Markers Lab Results  Component Value Date   AST 13 01/20/2018   ALT 10 (L) 09/06/2017   ALBUMIN 4.4 01/20/2018   ALKPHOS 78 01/20/2018                        Electrolytes Lab Results  Component Value Date   NA 132 (L) 01/20/2018   K 4.7 01/20/2018   CL 93 (L) 01/20/2018   CALCIUM 9.5 01/20/2018   MG 1.8 01/20/2018                        Neuropathy Markers Lab Results  Component Value Date   VITAMINB12 923 01/20/2018                        CNS Tests No results found for: COLORCSF, APPEARCSF, RBCCOUNTCSF, WBCCSF, POLYSCSF, LYMPHSCSF, EOSCSF, PROTEINCSF, GLUCCSF, JCVIRUS, CSFOLI,  IGGCSF                      Bone Pathology Markers Lab Results  Component Value Date   VD25OH 5.0 (L) 01/08/2016   25OHVITD1 41 01/20/2018   25OHVITD2 13 01/20/2018   25OHVITD3 28 01/20/2018                         Coagulation Parameters Lab Results  Component Value Date   INR 0.97 09/06/2017   LABPROT 12.8 09/06/2017   APTT 110.0 (H) 11/15/2011   PLT 347 09/07/2017                        Cardiovascular Markers Lab Results  Component Value Date   BNP 416.0 (H) 08/19/2016   TROPONINI <0.03 08/19/2016   HGB 9.6 (L) 09/07/2017   HCT 29.3 (L) 09/07/2017                         CA Markers No results found for: CEA, CA125, LABCA2                      Note: Lab results reviewed.  Recent Diagnostic Imaging Results  VAS [place] ABI WITH/WO TBI LOWER EXTREMITY DOPPLER STUDY  Indications: Peripheral artery disease.   Vascular Interventions: EVAR in 2008; H/O Left-to-Right Femoral-femoral BPG.  Performing Technologist: Blondell Reveal RT, RDMS, RVT    Examination Guidelines: A complete evaluation includes at minimum, Doppler waveform signals and systolic blood pressure reading at the level of bilateral brachial, anterior tibial, and posterior tibial arteries, when vessel segments are accessible. Bilateral testing is considered an integral part of a complete examination. Photoelectric Plethysmograph (PPG) waveforms and toe systolic pressure readings are included as required and additional duplex testing as needed. Limited examinations for reoccurring indications may be performed as noted.    ABI Findings: +---------+------------------+-----+--------+------------+ Right    Rt Pressure (mmHg)IndexWaveformComment      +---------+------------------+-----+--------+------------+ Brachial 187                                         +---------+------------------+-----+--------+------------+ ATA                                     not  detected +---------+------------------+-----+--------+------------+ PTA      185               0.99 biphasic             +---------+------------------+-----+--------+------------+ PERO     160               0.86 biphasic             +---------+------------------+-----+--------+------------+ Great Toe144               0.77 Abnormal             +---------+------------------+-----+--------+------------+  +---------+------------------+-----+--------+------------+ Left     Lt Pressure (mmHg)IndexWaveformComment      +---------+------------------+-----+--------+------------+ Brachial 175                                         +---------+------------------+-----+--------+------------+ ATA  not detected +---------+------------------+-----+--------+------------+ PTA      175               0.94 biphasic             +---------+------------------+-----+--------+------------+ PERO     180               0.96 biphasic             +---------+------------------+-----+--------+------------+ Great Toe198               1.06 Normal               +---------+------------------+-----+--------+------------+    Final Interpretation: Right: Resting right ankle-brachial index is within normal range. There is evidence of tibial artery level disease based on a non-detectable anterior tibial artery and mildly decreased peroneal artery pressure. The toe-brachial index is normal.   Left: Resting right ankle-brachial index is within normal range. There is evidence of tibial artery level disease based on a non-detectable anterior tibial artery. The toe-brachial index is normal.    *See table(s) above for measurements and observations.    Electronically signed by Leotis Pain MD on 04/17/2018 at 10:43:43 AM.    Final   VAS [place] EVAR DUPLEX Endovacular Aortic Repair Study (EVAR)  Indications: Follow up exam for  EVAR.  Vascular Interventions: EVAR in 2008; H/O Left-to-Right Fem-Fem BPG.  Limitations: Air/bowel gas.    Performing Technologist: Blondell Reveal RT, RDMS, RVT    Examination Guidelines: A complete evaluation includes B-mode imaging, spectral Doppler, color Doppler, and power Doppler as needed of all accessible portions of each vessel. Bilateral testing is considered an integral part of a complete examination. Limited examinations for reoccurring indications may be performed as noted.    Abdominal Aorta Findings: +-------------+-------+----------+----------+--------+--------+---------------+ Location     AP (cm)Trans (cm)PSV (cm/s)WaveformThrombusComments        +-------------+-------+----------+----------+--------+--------+---------------+ Proximal     2.28   2.48      38        biphasic                        +-------------+-------+----------+----------+--------+--------+---------------+ Mid          2.27   2.46      29        biphasic                        +-------------+-------+----------+----------+--------+--------+---------------+ Distal       2.62   2.93      76        biphasic                        +-------------+-------+----------+----------+--------+--------+---------------+ RT CIA Prox                                             known occlusion +-------------+-------+----------+----------+--------+--------+---------------+ LT CIA Prox                   88        biphasic                        +-------------+-------+----------+----------+--------+--------+---------------+ LT CIA Distal  not visualized  +-------------+-------+----------+----------+--------+--------+---------------+ LT EIA Prox                                             not visualized  +-------------+-------+----------+----------+--------+--------+---------------+ LT EIA Distal                  176       biphasic                        +-------------+-------+----------+----------+--------+--------+---------------+  Visualization of the Left CIA Distal artery and Left EIA Proximal artery was limited.  Patent left-to-right femoral-femoral bypass graft with no evidence of stenosis. Additional note: No significant velocity increases noted in the celiac or superior mesenteric arteries.   Final Interpretation: Patent endovascular aneurysm repair with no evidence of endoleak. No significant stenosis noted in the abdominal aorta or the visualized left iliac arteries. Known occlusion of the right limb and iliac system. The largest aortic diameter remains essentially unchanged compared to prior exam. Previous diameter measurement was 3.2 cm obtained on 09/06/17 by CT.   *See table(s) above for measurements and observations.   Electronically signed by Leotis Pain MD on 04/17/2018 at 10:43:40 AM.    Final    Complexity Note: Imaging results reviewed. Results shared with Ms. Kirn, using [employer].                         Meds   Current Outpatient Medications:  .  albuterol (PROVENTIL) (2.5 MG/3ML) 0.083% nebulizer solution, USE 1 VIAL VIA NEBULIZER EVERY 4 HOURS AS NEEDED FOR WHEEZING OR SHORTNESS OF BREATH, Disp: 75 mL, Rfl: 0 .  atenolol (TENORMIN) 25 MG tablet, TAKE 1 TABLET(25 MG) BY MOUTH TWICE DAILY, Disp: 60 tablet, Rfl: 5 .  atorvastatin (LIPITOR) 10 MG tablet, TAKE 1 TABLET BY MOUTH EVERY NIGHT AT BEDTIME, Disp: 90 tablet, Rfl: 3 .  Cholecalciferol (VITAMIN D3) 2000 units capsule, Take 1 capsule (2,000 Units total) by mouth daily., Disp: 30 capsule, Rfl: 12 .  clopidogrel (PLAVIX) 75 MG tablet, TAKE 1 TABLET BY MOUTH DAILY, Disp: 90 tablet, Rfl: 4 .  FEROSUL 325 (65 Fe) MG tablet, TAKE 1 TABLET(325 MG) BY MOUTH DAILY, Disp: 30 tablet, Rfl: 5 .  gabapentin (NEURONTIN) 300 MG capsule, Take 1 capsule (300 mg total) by mouth at bedtime., Disp: 90 capsule, Rfl: 1 .  [START ON  06/26/2018] HYDROcodone-acetaminophen (NORCO/VICODIN) 5-325 MG tablet, Take 1 tablet by mouth every 8 (eight) hours as needed for severe pain., Disp: 90 tablet, Rfl: 0 .  mirtazapine (REMERON) 15 MG tablet, , Disp: , Rfl: 5 .  OXYGEN, Inhale 2 L/min into the lungs. Use via nasal cannula at night and during the day as needed, Disp: , Rfl:  .  polyethylene glycol powder (GLYCOLAX/MIRALAX) powder, MIX AND TAKE 17 GRAMS BY MOUTH prn, Disp: , Rfl: 5 .  predniSONE (DELTASONE) 5 MG tablet, TAKE 1 TABLET(5 MG) BY MOUTH DAILY WITH BREAKFAST, Disp: 30 tablet, Rfl: 0 .  [START ON 05/27/2018] HYDROcodone-acetaminophen (NORCO/VICODIN) 5-325 MG tablet, Take 1 tablet by mouth every 8 (eight) hours as needed for severe pain., Disp: 90 tablet, Rfl: 0 .  [START ON 04/27/2018] HYDROcodone-acetaminophen (NORCO/VICODIN) 5-325 MG tablet, Take 1 tablet by mouth every 8 (eight) hours as needed for severe pain., Disp: 90 tablet, Rfl: 0 .  methocarbamol (ROBAXIN)  750 MG tablet, Take 1 tablet (750 mg total) by mouth 3 (three) times daily as needed for muscle spasms., Disp: 90 tablet, Rfl: 2  ROS  Constitutional: Denies any fever or chills Gastrointestinal: No reported hemesis, hematochezia, vomiting, or acute GI distress Musculoskeletal: Denies any acute onset joint swelling, redness, loss of ROM, or weakness Neurological: No reported episodes of acute onset apraxia, aphasia, dysarthria, agnosia, amnesia, paralysis, loss of coordination, or loss of consciousness  Allergies  Ms. Agostini has No Known Allergies.  PFSH  Drug: Ms. Moulin  reports that she does not use drugs. Alcohol:  reports that she does not drink alcohol. Tobacco:  reports that she has been smoking cigarettes. She has a 15.25 pack-year smoking history. She has never used smokeless tobacco. Medical:  has a past medical history of Aneurysm of iliac artery ([place]) (05/11/2015), COPD (chronic obstructive pulmonary disease) ([place]), Cystitis, History of DVT  (deep vein thrombosis) (05/11/2015), History of peptic ulcer disease (05/11/2015), [name], Hypertension, Losing weight, Migraines, [name], Rectal bleeding, Stroke ([place]), and Vascular disease. Surgical: Ms. Bendix  has a past surgical history that includes Partial hysterectomy; [name]; [name], 2008); Back surgery; Colonoscopy (2012); Esophagogastroduodenoscopy (04/2013); Abdominal hysterectomy; Appendectomy; blood clot ; Hip Arthroplasty (Right, 06/17/2017); and Joint replacement (Right, 06/19/2017). Family: family history includes Brain cancer in her father; Colon cancer in her brother; Kidney disease in her mother.  Constitutional Exam  General appearance: Well nourished, well developed, and well hydrated. In no apparent acute distress Vitals:   04/22/18 1329  BP: (!) 176/84  Pulse: 86  Resp: 20  Temp: 97.7 F (36.5 C)  SpO2: 92%  Weight: 100 lb (45.4 kg)  Height: '5\' 6"'  (1.676 m)   BMI Assessment: Estimated body mass index is 16.14 kg/m as calculated from the following:   Height as of this encounter: '5\' 6"'  (1.676 m).   Weight as of this encounter: 100 lb (45.4 kg). Psych/Mental status: Alert, oriented x 3 (person, place, & time)       Eyes: PERLA Respiratory: No evidence of acute respiratory distress productive cough with congestion  Lumbar Spine Area Exam  Skin & Axial Inspection: No masses, redness, or swelling Alignment: Symmetrical Functional ROM: Unrestricted ROM       Stability: No instability detected Muscle Tone/Strength: Functionally intact. No obvious neuro-muscular anomalies detected. Sensory (Neurological): Unimpaired Palpation: Tender       Provocative Tests: Hyperextension/rotation test: deferred today       Lumbar quadrant test (Kemp's test): deferred today       Lateral bending test: deferred today       Patrick's Maneuver: deferred today                    FABER test: deferred today                   S-I anterior distraction/compression test: deferred today         S-I lateral compression test: deferred today         S-I Thigh-thrust test: deferred today         S-I Gaenslen's test: deferred today          Gait & Posture Assessment  Ambulation: Patient ambulates using a walker Gait: Relatively normal for age and body habitus Posture: WNL   Lower Extremity Exam    Side: Right lower extremity  Side: Left lower extremity  Stability: No instability observed  Stability: No instability observed          Skin & Extremity Inspection: Edema  Skin & Extremity Inspection: Edema  Functional ROM: Unrestricted ROM                  Functional ROM: Unrestricted ROM                  Muscle Tone/Strength: Functionally intact. No obvious neuro-muscular anomalies detected.  Muscle Tone/Strength: Functionally intact. No obvious neuro-muscular anomalies detected.  Sensory (Neurological): Unimpaired  Sensory (Neurological): Unimpaired  Palpation: No palpable anomalies  Palpation: No palpable anomalies   Assessment  Primary Diagnosis & Pertinent Problem List: The primary encounter diagnosis was Lumbosacral Radiculopathy (Right). Diagnoses of Chronic radicular pain of lower extremity (Right), Chronic hip pain (Right), Neurogenic pain, Chronic pain syndrome, and Musculoskeletal pain were also pertinent to this visit.  Status Diagnosis  Persistent Persistent Persistent 1. Lumbosacral Radiculopathy (Right)   2. Chronic radicular pain of lower extremity (Right)   3. Chronic hip pain (Right)   4. Neurogenic pain   5. Chronic pain syndrome   6. Musculoskeletal pain     Problems updated and reviewed during this visit: No problems updated. Plan of Care  Pharmacotherapy (Medications Ordered): Meds ordered this encounter  Medications  . gabapentin (NEURONTIN) 300 MG capsule    Sig: Take 1 capsule (300 mg total) by mouth at bedtime.     Dispense:  90 capsule    Refill:  1    Do not place this medication, or any other prescription from our practice, on "Automatic Refill". Patient may have prescription filled one day early if pharmacy is closed on scheduled refill date.    Order Specific Question:   Supervising Provider    Answer:   Milinda Pointer [phone]  . HYDROcodone-acetaminophen (NORCO/VICODIN) 5-325 MG tablet    Sig: Take 1 tablet by mouth every 8 (eight) hours as needed for severe pain.    Dispense:  90 tablet    Refill:  0    Do not place this medication, or any other prescription from our practice, on "Automatic Refill". Patient may have prescription filled one day early if pharmacy is closed on scheduled refill date.    Order Specific Question:   Supervising Provider    Answer:   Milinda Pointer [phone]  . methocarbamol (ROBAXIN) 750 MG tablet    Sig: Take 1 tablet (750 mg total) by mouth 3 (three) times daily as needed for muscle spasms.    Dispense:  90 tablet    Refill:  2    Do not add this medication to the electronic "Automatic Refill" notification system. Patient may have prescription filled one day early if pharmacy is closed on scheduled refill date.    Order Specific Question:   Supervising Provider    Answer:   Milinda Pointer [phone]  . HYDROcodone-acetaminophen (NORCO/VICODIN) 5-325 MG tablet    Sig: Take 1 tablet by mouth every 8 (eight) hours as needed for severe pain.    Dispense:  90 tablet    Refill:  0    Do not place this medication, or any other prescription from our practice, on "Automatic Refill". Patient may have prescription filled one day early if pharmacy is closed on scheduled refill date.    Order Specific Question:   Supervising Provider    Answer:   Milinda Pointer [phone]  . HYDROcodone-acetaminophen (NORCO/VICODIN) 5-325 MG tablet    Sig: Take 1 tablet by mouth every 8 (eight)  hours as needed for severe pain.    Dispense:  90 tablet    Refill:  0    Do not place  this medication, or any other prescription from our practice, on "Automatic Refill". Patient may have prescription filled one day early if pharmacy is closed on scheduled refill date.    Order Specific Question:   Supervising Provider    Answer:   Milinda Pointer [[id number]]   New Prescriptions   No medications on file   Medications administered today: Deolinda F. Richins had no medications administered during this visit. Lab-work, procedure(s), and/or referral(s): No orders of the defined types were placed in this encounter.  Imaging and/or referral(s): None  Interventional therapies: Planned, scheduled, and/or pending:  Not at time.  Encourage patient to go to urgent care to be evaluated for cough congestion and increased peripheral edema   Considering:  Diagnostic right L4-5 transforaminal epidural steroid injection  Diagnostic right L4-5 lumbar epidural steroid injection  Diagnostic bilateral lumbar facet block  Possible bilateral lumbar facet radiofrequency ablation    Palliative PRN treatment(s):  Diagnostic right L4-5 transforaminal epidural steroid injection Diagnostic right L4-5 lumbar epidural steroid injection  Diagnostic bilateral lumbar facet block    Provider-requested follow-up: Return in about 3 months (around 07/23/2018) for MedMgmt.  Future Appointments  Date Time Provider [place]  07/23/2018  1:45 PM Vevelyn Francois, NP [facility] None  04/19/2019 10:00 AM [facility] None  04/19/2019 11:00 AM [facility] None  04/19/2019 11:45 AM Kris Hartmann, NP [facility] None   Primary Care Physician: Glean Hess, MD Location: [facility] Note by: Vevelyn Francois NP Date: 04/22/2018; Time: 5:02 PM  Pain Score Disclaimer: We use the NRS-11 scale. This is a self-reported, subjective measurement of pain severity with only modest accuracy. It is used primarily to identify changes within a particular patient.  It must be understood that outpatient pain scales are significantly less accurate that those used for research, where they can be applied under ideal controlled circumstances with minimal exposure to variables. In reality, the score is likely to be a combination of pain intensity and pain affect, where pain affect describes the degree of emotional arousal or changes in action readiness caused by the sensory experience of pain. Factors such as social and work situation, setting, emotional state, anxiety levels, expectation, and prior pain experience may influence pain perception and show large inter-individual differences that may also be affected by time variables.  Patient instructions provided during this appointment: Patient Instructions  ____________________________________________________________________________________________  Medication Rules  Applies to: All patients receiving prescriptions (written or electronic).  Pharmacy of record: Pharmacy where electronic prescriptions will be sent. If written prescriptions are taken to a different pharmacy, please inform the nursing staff. The pharmacy listed in the electronic medical record should be the one where you would like electronic prescriptions to be sent.  Prescription refills: Only during scheduled appointments. Applies to both, written and electronic prescriptions.  NOTE: The following applies primarily to controlled substances (Opioid* Pain Medications).   Patient's responsibilities: 1. Pain Pills: Bring all pain pills to every appointment (except for procedure appointments). 2. Pill Bottles: Bring pills in original pharmacy bottle. Always bring newest bottle. Bring bottle, even if empty. 3. Medication refills: You are responsible for knowing and keeping track of what medications you need refilled. The day before your appointment, write a list of all prescriptions that need to be refilled. Bring that list to your  appointment and give  it to the admitting nurse. Prescriptions will be written only during appointments. If you forget a medication, it will not be "Called in", "Faxed", or "electronically sent". You will need to get another appointment to get these prescribed. 4. Prescription Accuracy: You are responsible for carefully inspecting your prescriptions before leaving our office. Have the discharge nurse carefully go over each prescription with you, before taking them home. Make sure that your name is accurately spelled, that your address is correct. Check the name and dose of your medication to make sure it is accurate. Check the number of pills, and the written instructions to make sure they are clear and accurate. Make sure that you are given enough medication to last until your next medication refill appointment. 5. Taking Medication: Take medication as prescribed. Never take more pills than instructed. Never take medication more frequently than prescribed. Taking less pills or less frequently is permitted and encouraged, when it comes to controlled substances (written prescriptions).  6. Inform other Doctors: Always inform, all of your healthcare providers, of all the medications you take. 7. Pain Medication from other Providers: You are not allowed to accept any additional pain medication from any other Doctor or Healthcare provider. There are two exceptions to this rule. (see below) In the event that you require additional pain medication, you are responsible for notifying us, as stated below. 8. Medication Agreement: You are responsible for carefully reading and following our Medication Agreement. This must be signed before receiving any prescriptions from our practice. Safely store a copy of your signed Agreement. Violations to the Agreement will result in no further prescriptions. (Additional copies of our Medication Agreement are available upon request.) 9. Laws, Rules, & Regulations: All patients are expected to follow all  Federal and [employer], Rules, [employer]. Ignorance of the Laws does not constitute a valid excuse. The use of any illegal substances is prohibited. 10. Adopted CDC guidelines & recommendations: Target dosing levels will be at or below 60 MME/day. Use of benzodiazepines** is not recommended.  Exceptions: There are only two exceptions to the rule of not receiving pain medications from other Healthcare Providers. 1. Exception #1 (Emergencies): In the event of an emergency (i.e.: accident requiring emergency care), you are allowed to receive additional pain medication. However, you are responsible for: As soon as you are able, call our office [phone], at any time of the day or night, and leave a message stating your name, the date and nature of the emergency, and the name and dose of the medication prescribed. In the event that your call is answered by a member of our staff, make sure to document and save the date, time, and the name of the person that took your information.  2. Exception #2 (Planned Surgery): In the event that you are scheduled by another doctor or dentist to have any type of surgery or procedure, you are allowed (for a period no longer than 30 days), to receive additional pain medication, for the acute post-op pain. However, in this case, you are responsible for picking up a copy of our "Post-op Pain Management for Surgeons" handout, and giving it to your surgeon or dentist. This document is available at our office, and does not require an appointment to obtain it. Simply go to our office during business hours (Monday-Thursday from 8:00 AM to 4:00 PM) (Friday 8:00 AM to 12:00 Noon) or if you have a scheduled appointment with [place], prior to your surgery, and  ask for it by name. In addition, you will need to provide [place] with your name, name of your surgeon, type of surgery, and date of procedure or surgery.  *Opioid medications include: morphine, codeine, oxycodone,  oxymorphone, hydrocodone, hydromorphone, meperidine, tramadol, tapentadol, buprenorphine, fentanyl, methadone. **Benzodiazepine medications include: diazepam (Valium), alprazolam (Xanax), clonazepam (Klonopine), lorazepam (Ativan), clorazepate (Tranxene), chlordiazepoxide (Librium), estazolam (Prosom), oxazepam (Serax), temazepam (Restoril), triazolam (Halcion) (Last updated: 09/11/2017) ____________________________________________________________________________________________   Gabapentin 376m and robaxin 750 mg escribed to pharmacy.  Hydrocodone - apap 5-325 mg x 3 months to begin filling on 04/27/18 escribed to pharmacy.

## 2018-04-22 NOTE — Telephone Encounter (Signed)
Hi This was to check up on the patient for tomorrow. I am sending her to Urgent Care today for the cough and congestion and the edema in her feet.

## 2018-04-22 NOTE — Patient Instructions (Addendum)
____________________________________________________________________________________________  Medication Rules  Applies to: All patients receiving prescriptions (written or electronic).  Pharmacy of record: Pharmacy where electronic prescriptions will be sent. If written prescriptions are taken to a different pharmacy, please inform the nursing staff. The pharmacy listed in the electronic medical record should be the one where you would like electronic prescriptions to be sent.  Prescription refills: Only during scheduled appointments. Applies to both, written and electronic prescriptions.  NOTE: The following applies primarily to controlled substances (Opioid* Pain Medications).   Patient's responsibilities: 1. Pain Pills: Bring all pain pills to every appointment (except for procedure appointments). 2. Pill Bottles: Bring pills in original pharmacy bottle. Always bring newest bottle. Bring bottle, even if empty. 3. Medication refills: You are responsible for knowing and keeping track of what medications you need refilled. The day before your appointment, write a list of all prescriptions that need to be refilled. Bring that list to your appointment and give it to the admitting nurse. Prescriptions will be written only during appointments. If you forget a medication, it will not be "Called in", "Faxed", or "electronically sent". You will need to get another appointment to get these prescribed. 4. Prescription Accuracy: You are responsible for carefully inspecting your prescriptions before leaving our office. Have the discharge nurse carefully go over each prescription with you, before taking them home. Make sure that your name is accurately spelled, that your address is correct. Check the name and dose of your medication to make sure it is accurate. Check the number of pills, and the written instructions to make sure they are clear and accurate. Make sure that you are given enough medication to last  until your next medication refill appointment. 5. Taking Medication: Take medication as prescribed. Never take more pills than instructed. Never take medication more frequently than prescribed. Taking less pills or less frequently is permitted and encouraged, when it comes to controlled substances (written prescriptions).  6. Inform other Doctors: Always inform, all of your healthcare providers, of all the medications you take. 7. Pain Medication from other Providers: You are not allowed to accept any additional pain medication from any other Doctor or Healthcare provider. There are two exceptions to this rule. (see below) In the event that you require additional pain medication, you are responsible for notifying us, as stated below. 8. Medication Agreement: You are responsible for carefully reading and following our Medication Agreement. This must be signed before receiving any prescriptions from our practice. Safely store a copy of your signed Agreement. Violations to the Agreement will result in no further prescriptions. (Additional copies of our Medication Agreement are available upon request.) 9. Laws, Rules, & Regulations: All patients are expected to follow all Federal and State Laws, Statutes, Rules, & Regulations. Ignorance of the Laws does not constitute a valid excuse. The use of any illegal substances is prohibited. 10. Adopted CDC guidelines & recommendations: Target dosing levels will be at or below 60 MME/day. Use of benzodiazepines** is not recommended.  Exceptions: There are only two exceptions to the rule of not receiving pain medications from other Healthcare Providers. 1. Exception #1 (Emergencies): In the event of an emergency (i.e.: accident requiring emergency care), you are allowed to receive additional pain medication. However, you are responsible for: As soon as you are able, call our office [phone], at any time of the day or night, and leave a message stating your name, the  date and nature of the emergency, and the name and dose of the medication   prescribed. In the event that your call is answered by a member of our staff, make sure to document and save the date, time, and the name of the person that took your information.  2. Exception #2 (Planned Surgery): In the event that you are scheduled by another doctor or dentist to have any type of surgery or procedure, you are allowed (for a period no longer than 30 days), to receive additional pain medication, for the acute post-op pain. However, in this case, you are responsible for picking up a copy of our "Post-op Pain Management for Surgeons" handout, and giving it to your surgeon or dentist. This document is available at our office, and does not require an appointment to obtain it. Simply go to our office during business hours (Monday-Thursday from 8:00 AM to 4:00 PM) (Friday 8:00 AM to 12:00 Noon) or if you have a scheduled appointment with [place], prior to your surgery, and ask for it by name. In addition, you will need to provide [place] with your name, name of your surgeon, type of surgery, and date of procedure or surgery.  *Opioid medications include: morphine, codeine, oxycodone, oxymorphone, hydrocodone, hydromorphone, meperidine, tramadol, tapentadol, buprenorphine, fentanyl, methadone. **Benzodiazepine medications include: diazepam (Valium), alprazolam (Xanax), clonazepam (Klonopine), lorazepam (Ativan), clorazepate (Tranxene), chlordiazepoxide (Librium), estazolam (Prosom), oxazepam (Serax), temazepam (Restoril), triazolam (Halcion) (Last updated: 09/11/2017) ____________________________________________________________________________________________   Gabapentin 300mg  and robaxin 750 mg escribed to pharmacy.  Hydrocodone - apap 5-325 mg x 3 months to begin filling on 04/27/18 escribed to pharmacy.

## 2018-04-22 NOTE — Telephone Encounter (Signed)
Spoken with nurse at [facility].  They report the last time Tasha Reyes was seen in their clinic was August 19, 2016 with s/s dysuria, frequency, urgency, flank pain and abd pain.  Also, evaluated was cough/congestion with O2 sates 84 - 87% for which she was sent via EMS to ED for further evaluation. They have not seen Tasha Reyes since that time.

## 2018-04-22 NOTE — Progress Notes (Signed)
Nursing Pain Medication Assessment:  Safety precautions to be maintained throughout the outpatient stay will include: orient to surroundings, keep bed in low position, maintain call bell within reach at all times, provide assistance with transfer out of bed and ambulation.   Medication Inspection Compliance: Pill count conducted under aseptic conditions, in front of the patient. Neither the pills nor the bottle was removed from the patient's sight at any time. Once count was completed pills were immediately returned to the patient in their original bottle.  Medication: Hydrocodone/APAP Pill/Patch Count: 10 of 90 pills remain Pill/Patch Appearance: Markings consistent with prescribed medication Bottle Appearance: Standard pharmacy container. Clearly labeled. Filled Date: 09 / 14 / 2019 Last Medication intake:  Today

## 2018-04-23 ENCOUNTER — Telehealth: Payer: Self-pay | Admitting: *Deleted

## 2018-04-23 NOTE — Telephone Encounter (Signed)
Thanks for the update

## 2018-04-23 NOTE — Telephone Encounter (Signed)
Spoke to nurse at urgent care [place].   Tasha Reyes did not go for evaluation today of cough/congestion and edema.

## 2018-05-20 ENCOUNTER — Encounter: Payer: Self-pay | Admitting: Emergency Medicine

## 2018-05-20 ENCOUNTER — Other Ambulatory Visit: Payer: Self-pay

## 2018-05-20 ENCOUNTER — Emergency Department: Payer: Medicare Other

## 2018-05-20 ENCOUNTER — Ambulatory Visit
Admission: EM | Admit: 2018-05-20 | Discharge: 2018-05-20 | Disposition: A | Discharge status: Home / Self Care | Payer: Medicare Other | Attending: Family Medicine | Admitting: Family Medicine

## 2018-05-20 ENCOUNTER — Inpatient Hospital Stay
Admission: EM | Admit: 2018-05-20 | Discharge: 2018-05-23 | DRG: 291 | Disposition: A | Discharge status: Home / Self Care | Payer: Medicare Other | Attending: Family Medicine | Admitting: Family Medicine

## 2018-05-20 DIAGNOSIS — I739 Peripheral vascular disease, unspecified: Secondary | ICD-10-CM | POA: Diagnosis present

## 2018-05-20 DIAGNOSIS — Z7952 Long term (current) use of systemic steroids: Secondary | ICD-10-CM

## 2018-05-20 DIAGNOSIS — Z808 Family history of malignant neoplasm of other organs or systems: Secondary | ICD-10-CM

## 2018-05-20 DIAGNOSIS — E785 Hyperlipidemia, unspecified: Secondary | ICD-10-CM | POA: Diagnosis present

## 2018-05-20 DIAGNOSIS — E559 Vitamin D deficiency, unspecified: Secondary | ICD-10-CM | POA: Diagnosis present

## 2018-05-20 DIAGNOSIS — R0902 Hypoxemia: Secondary | ICD-10-CM

## 2018-05-20 DIAGNOSIS — G43909 Migraine, unspecified, not intractable, without status migrainosus: Secondary | ICD-10-CM | POA: Diagnosis present

## 2018-05-20 DIAGNOSIS — I110 Hypertensive heart disease with heart failure: Secondary | ICD-10-CM | POA: Diagnosis present

## 2018-05-20 DIAGNOSIS — J440 Chronic obstructive pulmonary disease with acute lower respiratory infection: Secondary | ICD-10-CM | POA: Diagnosis present

## 2018-05-20 DIAGNOSIS — E440 Moderate protein-calorie malnutrition: Secondary | ICD-10-CM | POA: Diagnosis present

## 2018-05-20 DIAGNOSIS — R51 Headache: Secondary | ICD-10-CM | POA: Diagnosis not present

## 2018-05-20 DIAGNOSIS — Z8711 Personal history of peptic ulcer disease: Secondary | ICD-10-CM

## 2018-05-20 DIAGNOSIS — Z800 Family history of malignant neoplasm of digestive organs: Secondary | ICD-10-CM

## 2018-05-20 DIAGNOSIS — F17210 Nicotine dependence, cigarettes, uncomplicated: Secondary | ICD-10-CM | POA: Diagnosis present

## 2018-05-20 DIAGNOSIS — E7800 Pure hypercholesterolemia, unspecified: Secondary | ICD-10-CM | POA: Diagnosis present

## 2018-05-20 DIAGNOSIS — Z96641 Presence of right artificial hip joint: Secondary | ICD-10-CM | POA: Diagnosis present

## 2018-05-20 DIAGNOSIS — J449 Chronic obstructive pulmonary disease, unspecified: Secondary | ICD-10-CM | POA: Diagnosis not present

## 2018-05-20 DIAGNOSIS — S0990XA Unspecified injury of head, initial encounter: Secondary | ICD-10-CM | POA: Diagnosis not present

## 2018-05-20 DIAGNOSIS — Z86718 Personal history of other venous thrombosis and embolism: Secondary | ICD-10-CM | POA: Diagnosis not present

## 2018-05-20 DIAGNOSIS — Z7902 Long term (current) use of antithrombotics/antiplatelets: Secondary | ICD-10-CM | POA: Diagnosis not present

## 2018-05-20 DIAGNOSIS — Z681 Body mass index (BMI) 19 or less, adult: Secondary | ICD-10-CM

## 2018-05-20 DIAGNOSIS — R600 Localized edema: Secondary | ICD-10-CM

## 2018-05-20 DIAGNOSIS — G894 Chronic pain syndrome: Secondary | ICD-10-CM | POA: Diagnosis present

## 2018-05-20 DIAGNOSIS — Z8673 Personal history of transient ischemic attack (TIA), and cerebral infarction without residual deficits: Secondary | ICD-10-CM

## 2018-05-20 DIAGNOSIS — R609 Edema, unspecified: Secondary | ICD-10-CM | POA: Diagnosis not present

## 2018-05-20 DIAGNOSIS — J90 Pleural effusion, not elsewhere classified: Secondary | ICD-10-CM | POA: Diagnosis not present

## 2018-05-20 DIAGNOSIS — J9620 Acute and chronic respiratory failure, unspecified whether with hypoxia or hypercapnia: Secondary | ICD-10-CM | POA: Diagnosis not present

## 2018-05-20 DIAGNOSIS — J9621 Acute and chronic respiratory failure with hypoxia: Secondary | ICD-10-CM | POA: Diagnosis present

## 2018-05-20 DIAGNOSIS — G629 Polyneuropathy, unspecified: Secondary | ICD-10-CM | POA: Diagnosis present

## 2018-05-20 DIAGNOSIS — J189 Pneumonia, unspecified organism: Secondary | ICD-10-CM | POA: Diagnosis present

## 2018-05-20 DIAGNOSIS — I10 Essential (primary) hypertension: Secondary | ICD-10-CM | POA: Diagnosis not present

## 2018-05-20 DIAGNOSIS — M5136 Other intervertebral disc degeneration, lumbar region: Secondary | ICD-10-CM | POA: Diagnosis present

## 2018-05-20 DIAGNOSIS — Z9981 Dependence on supplemental oxygen: Secondary | ICD-10-CM

## 2018-05-20 DIAGNOSIS — F329 Major depressive disorder, single episode, unspecified: Secondary | ICD-10-CM | POA: Diagnosis present

## 2018-05-20 DIAGNOSIS — I5033 Acute on chronic diastolic (congestive) heart failure: Secondary | ICD-10-CM | POA: Diagnosis present

## 2018-05-20 DIAGNOSIS — Z90710 Acquired absence of both cervix and uterus: Secondary | ICD-10-CM | POA: Diagnosis not present

## 2018-05-20 DIAGNOSIS — I509 Heart failure, unspecified: Secondary | ICD-10-CM | POA: Diagnosis not present

## 2018-05-20 DIAGNOSIS — Z841 Family history of disorders of kidney and ureter: Secondary | ICD-10-CM

## 2018-05-20 DIAGNOSIS — R0602 Shortness of breath: Secondary | ICD-10-CM | POA: Diagnosis not present

## 2018-05-20 HISTORY — DX: Acute and chronic respiratory failure, unspecified whether with hypoxia or hypercapnia: J96.20

## 2018-05-20 LAB — CBC
HCT: 46.7 % — ABNORMAL HIGH (ref 36.0–46.0)
HEMOGLOBIN: 14.7 g/dL (ref 12.0–15.0)
MCH: 27.2 pg (ref 26.0–34.0)
MCHC: 31.5 g/dL (ref 30.0–36.0)
MCV: 86.5 fL (ref 80.0–100.0)
Platelets: 547 10*3/uL — ABNORMAL HIGH (ref 150–400)
RBC: 5.4 MIL/uL — ABNORMAL HIGH (ref 3.87–5.11)
RDW: 20.5 % — ABNORMAL HIGH (ref 11.5–15.5)
WBC: 10.3 10*3/uL (ref 4.0–10.5)
nRBC: 0 % (ref 0.0–0.2)

## 2018-05-20 LAB — TROPONIN I

## 2018-05-20 LAB — COMPREHENSIVE METABOLIC PANEL
ALK PHOS: 64 U/L (ref 38–126)
ALT: 19 U/L (ref 0–44)
AST: 24 U/L (ref 15–41)
Albumin: 3.7 g/dL (ref 3.5–5.0)
Anion gap: 8 (ref 5–15)
BILIRUBIN TOTAL: 0.7 mg/dL (ref 0.3–1.2)
BUN: 18 mg/dL (ref 8–23)
CALCIUM: 9 mg/dL (ref 8.9–10.3)
CO2: 32 mmol/L (ref 22–32)
Chloride: 100 mmol/L (ref 98–111)
Creatinine, Ser: 0.85 mg/dL (ref 0.44–1.00)
GFR calc non Af Amer: >60 mL/min (ref >60)
Glucose, Bld: 130 mg/dL — ABNORMAL HIGH (ref 70–99)
Potassium: 3.8 mmol/L (ref 3.5–5.1)
SODIUM: 140 mmol/L (ref 135–145)
TOTAL PROTEIN: 7.5 g/dL (ref 6.5–8.1)

## 2018-05-20 LAB — BRAIN NATRIURETIC PEPTIDE: B Natriuretic Peptide: 1055 pg/mL — ABNORMAL HIGH (ref 0.0–100.0)

## 2018-05-20 MED ORDER — SODIUM CHLORIDE 0.9 % IV SOLN
500.0000 mg | Freq: Once | INTRAVENOUS | Status: AC
  Administered 2018-05-20: 500 mg via INTRAVENOUS
  Filled 2018-05-20: qty 500

## 2018-05-20 MED ORDER — CLOPIDOGREL BISULFATE 75 MG PO TABS
75.0000 mg | ORAL_TABLET | Freq: Every day | ORAL | Status: DC
  Administered 2018-05-20 – 2018-05-23 (×4): 75 mg via ORAL
  Filled 2018-05-20 (×4): qty 1

## 2018-05-20 MED ORDER — ACETAMINOPHEN 325 MG PO TABS: 650.0000 mg | ORAL_TABLET | Freq: Four times a day (QID) | ORAL | Status: DC | PRN

## 2018-05-20 MED ORDER — ATENOLOL 25 MG PO TABS
25.0000 mg | ORAL_TABLET | Freq: Two times a day (BID) | ORAL | Status: DC
  Administered 2018-05-20 – 2018-05-23 (×6): 25 mg via ORAL
  Filled 2018-05-20 (×7): qty 1

## 2018-05-20 MED ORDER — MIRTAZAPINE 15 MG PO TABS
15.0000 mg | ORAL_TABLET | Freq: Every day | ORAL | Status: DC
  Administered 2018-05-20 – 2018-05-22 (×3): 15 mg via ORAL
  Filled 2018-05-20 (×3): qty 1

## 2018-05-20 MED ORDER — ENOXAPARIN SODIUM 40 MG/0.4ML ~~LOC~~ SOLN
40.0000 mg | SUBCUTANEOUS | Status: DC
  Administered 2018-05-20: 40 mg via SUBCUTANEOUS
  Filled 2018-05-20: qty 0.4

## 2018-05-20 MED ORDER — HYDROCODONE-ACETAMINOPHEN 5-325 MG PO TABS
1.0000 | ORAL_TABLET | ORAL | Status: DC | PRN
  Administered 2018-05-20 – 2018-05-23 (×8): 1 via ORAL
  Filled 2018-05-20 (×8): qty 1

## 2018-05-20 MED ORDER — TRAZODONE HCL 50 MG PO TABS: 25.0000 mg | ORAL_TABLET | Freq: Every evening | ORAL | Status: DC | PRN

## 2018-05-20 MED ORDER — DOCUSATE SODIUM 100 MG PO CAPS
100.0000 mg | ORAL_CAPSULE | Freq: Two times a day (BID) | ORAL | Status: DC
  Administered 2018-05-20 – 2018-05-23 (×6): 100 mg via ORAL
  Filled 2018-05-20 (×6): qty 1

## 2018-05-20 MED ORDER — AZITHROMYCIN 250 MG PO TABS
250.0000 mg | ORAL_TABLET | Freq: Every day | ORAL | Status: DC
  Administered 2018-05-21 – 2018-05-23 (×3): 250 mg via ORAL
  Filled 2018-05-20 (×3): qty 1

## 2018-05-20 MED ORDER — ONDANSETRON HCL 4 MG PO TABS: 4.0000 mg | ORAL_TABLET | Freq: Four times a day (QID) | ORAL | Status: DC | PRN

## 2018-05-20 MED ORDER — FERROUS SULFATE 325 (65 FE) MG PO TABS
325.0000 mg | ORAL_TABLET | Freq: Every day | ORAL | Status: DC
  Administered 2018-05-20 – 2018-05-23 (×4): 325 mg via ORAL
  Filled 2018-05-20 (×4): qty 1

## 2018-05-20 MED ORDER — IPRATROPIUM-ALBUTEROL 0.5-2.5 (3) MG/3ML IN SOLN
3.0000 mL | Freq: Four times a day (QID) | RESPIRATORY_TRACT | Status: DC
  Administered 2018-05-20 – 2018-05-22 (×6): 3 mL via RESPIRATORY_TRACT
  Filled 2018-05-20 (×5): qty 3

## 2018-05-20 MED ORDER — BISACODYL 5 MG PO TBEC: 5.0000 mg | DELAYED_RELEASE_TABLET | Freq: Every day | ORAL | Status: DC | PRN

## 2018-05-20 MED ORDER — AZITHROMYCIN 250 MG PO TABS: 500.0000 mg | ORAL_TABLET | Freq: Every day | ORAL | Status: AC

## 2018-05-20 MED ORDER — ATORVASTATIN CALCIUM 10 MG PO TABS
10.0000 mg | ORAL_TABLET | Freq: Every day | ORAL | Status: DC
  Administered 2018-05-20 – 2018-05-22 (×3): 10 mg via ORAL
  Filled 2018-05-20 (×3): qty 1

## 2018-05-20 MED ORDER — SODIUM CHLORIDE 0.9 % IV SOLN
1.0000 g | Freq: Once | INTRAVENOUS | Status: AC
  Administered 2018-05-20: 1 g via INTRAVENOUS
  Filled 2018-05-20: qty 10

## 2018-05-20 MED ORDER — ACETAMINOPHEN 650 MG RE SUPP: 650.0000 mg | Freq: Four times a day (QID) | RECTAL | Status: DC | PRN

## 2018-05-20 MED ORDER — ONDANSETRON HCL 4 MG/2ML IJ SOLN
4.0000 mg | Freq: Four times a day (QID) | INTRAMUSCULAR | Status: DC | PRN
  Administered 2018-05-20: 4 mg via INTRAVENOUS
  Filled 2018-05-20: qty 2

## 2018-05-20 MED ORDER — FUROSEMIDE 10 MG/ML IJ SOLN
40.0000 mg | Freq: Two times a day (BID) | INTRAMUSCULAR | Status: DC
  Administered 2018-05-21 – 2018-05-23 (×5): 40 mg via INTRAVENOUS
  Filled 2018-05-20 (×5): qty 4

## 2018-05-20 MED ORDER — FUROSEMIDE 10 MG/ML IJ SOLN
40.0000 mg | Freq: Once | INTRAMUSCULAR | Status: AC
  Administered 2018-05-20: 40 mg via INTRAVENOUS
  Filled 2018-05-20: qty 4

## 2018-05-20 NOTE — ED Triage Notes (Signed)
Sent for low pulse ox---refused ambulance transport from clinic. Patient is alert and sitting in wheelchair.  Pulse ox is 83%on RA.  Says she has been sick for 3 weeks with resp sx.  Says her legs are swollen and sore.

## 2018-05-20 NOTE — ED Provider Notes (Signed)
[facility] Emergency Department Provider Note  Time seen: 5:14 PM  I have reviewed the triage vital signs and the nursing notes.   HISTORY  Chief Complaint Shortness of Breath and Leg Swelling    HPI Tasha Reyes is a 84 y.o. female with a past medical history of COPD on 3 L as needed, hypertension, hyperlipidemia, CVA, presents to the emergency department for lower extremity edema and shortness of breath over the past 3 weeks.  According to the patient over the past 3 weeks she has had somewhat progressively worsening shortness of breath, worse with exertion.  Is also noticed swelling in both of her feet and lower extremities.  Denies any significant swelling previously.  Denies any chest pain at any point over the past several weeks.  Denies any shortness of breath currently lying in bed on 3 L satting 95%.  Denies any fever.  Does state mild cough with occasional clear sputum production.  No abdominal pain.  Has noted decreased urination on review of systems.   Past Medical History:  Diagnosis Date  . Aneurysm of iliac artery (HCC) 05/11/2015  . COPD (chronic obstructive pulmonary disease) ([place])   . Cystitis   . History of DVT (deep vein thrombosis) 05/11/2015  . History of peptic ulcer disease 05/11/2015  . [name]   . Hypertension   . Losing weight   . Migraines   . [name]   . Rectal bleeding   . Stroke ([place])   . Vascular disease     Patient Active Problem List   Diagnosis Date Noted  . Chronic pain syndrome 03/02/2018  . Chronic bilateral low back pain 02/24/2018  . Bilateral leg numbness 02/24/2018  . Accidental medication overdose 02/22/2018  . DDD (degenerative disc disease), lumbar 02/03/2018  . Chronic anticoagulation (Plavix) 02/03/2018  . Chronic lower extremity pain (Bilateral) (R>L) 01/20/2018  . Disorder of skeletal system 01/20/2018  . Chronic hip pain (Right) 10/21/2017  . Rectal bleeding  09/06/2017  . History of repair of hip fracture 06/16/2017  . AAA (abdominal aortic aneurysm) without rupture ([place]) 03/18/2017  . Edema due to malnutrition ([place]) 10/28/2016  . Dependence on supplemental oxygen 09/04/2016  . Protein-calorie malnutrition, severe 08/20/2016  . Vitamin D deficiency 01/10/2016  . Opioid-induced constipation (OIC) 11/07/2015  . Osteoporosis, post-menopausal 11/07/2015  . Complaints of weakness of lower extremities (Bilateral) 11/07/2015  . Lumbar foraminal stenosis (Right) (Severe at L4-5) 06/20/2015  . Chronic lower extremity pain (Location of Primary Source of Pain) (Right) 05/11/2015  . Lumbosacral Radiculopathy (Right) 05/11/2015  . Chronic radicular pain of lower extremity (Right) 05/11/2015  . Peripheral vascular disease ([place]) 05/11/2015  . Hx of long term use of blood thinners (Plavix) 05/11/2015  . Lower extremity pain (Right) 05/11/2015  . Neurogenic pain 05/11/2015  . History of DVT (deep vein thrombosis) 05/11/2015  . Lumbar facet syndrome (Bilateral) 05/11/2015  . Dyslipidemia 12/23/2014  . Compulsive tobacco user syndrome 12/23/2014  . History of bleeding peptic ulcer 12/23/2014  . Essential (primary) hypertension 12/23/2014  . Depression, major, single episode, complete remission ([place]) 12/23/2014  . COPD (chronic obstructive pulmonary disease) ([place]) 12/23/2014  . Restless leg 12/23/2014  . Chronic tension-type headache, intractable 12/23/2014    Past Surgical History:  Procedure Laterality Date  . ABDOMINAL AORTIC ANEURYSM REPAIR W/ ENDOLUMINAL GRAFT  2008  . ABDOMINAL HYSTERECTOMY    . APPENDECTOMY    . BACK SURGERY    . blood clot  removal right leg  . COLONOSCOPY  2012  . ESOPHAGOGASTRODUODENOSCOPY  04/2013   gastric ulcers  . FEMORAL-POPLITEAL BYPASS GRAFT Right 2008  . HIP ARTHROPLASTY Right 06/17/2017   Procedure: ARTHROPLASTY BIPOLAR HIP (HEMIARTHROPLASTY);  Surgeon: Earnestine Leys, MD;  Location: [facility] ORS;  Service:  Orthopedics;  Laterality: Right;  . JOINT REPLACEMENT Right 06/19/2017   right hip FX.  Marland Kitchen PARTIAL HYSTERECTOMY      Prior to Admission medications   Medication Sig Start Date End Date Taking? Authorizing Provider  albuterol (PROVENTIL) (2.5 MG/3ML) 0.083% nebulizer solution USE 1 VIAL VIA NEBULIZER EVERY 4 HOURS AS NEEDED FOR WHEEZING OR SHORTNESS OF BREATH 11/26/17   Glean Hess, MD  atenolol (TENORMIN) 25 MG tablet TAKE 1 TABLET(25 MG) BY MOUTH TWICE DAILY 01/22/18   Glean Hess, MD  atorvastatin (LIPITOR) 10 MG tablet TAKE 1 TABLET BY MOUTH EVERY NIGHT AT BEDTIME 12/09/17   Glean Hess, MD  Cholecalciferol (VITAMIN D3) 2000 units capsule Take 1 capsule (2,000 Units total) by mouth daily. 10/21/17   Vevelyn Francois, NP  clopidogrel (PLAVIX) 75 MG tablet TAKE 1 TABLET BY MOUTH DAILY 03/23/18   Algernon Huxley, MD  FEROSUL 325 (65 Fe) MG tablet TAKE 1 TABLET(325 MG) BY MOUTH DAILY 01/25/18   Glean Hess, MD  gabapentin (NEURONTIN) 300 MG capsule Take 1 capsule (300 mg total) by mouth at bedtime. 04/22/18 10/19/18  Vevelyn Francois, NP  HYDROcodone-acetaminophen (NORCO/VICODIN) 5-325 MG tablet Take 1 tablet by mouth every 8 (eight) hours as needed for severe pain. 06/26/18 07/26/18  Vevelyn Francois, NP  HYDROcodone-acetaminophen (NORCO/VICODIN) 5-325 MG tablet Take 1 tablet by mouth every 8 (eight) hours as needed for severe pain. 05/27/18 06/26/18  Vevelyn Francois, NP  HYDROcodone-acetaminophen (NORCO/VICODIN) 5-325 MG tablet Take 1 tablet by mouth every 8 (eight) hours as needed for severe pain. 04/27/18 05/27/18  Vevelyn Francois, NP  methocarbamol (ROBAXIN) 750 MG tablet Take 1 tablet (750 mg total) by mouth 3 (three) times daily as needed for muscle spasms. 04/22/18 07/21/18  Vevelyn Francois, NP  mirtazapine (REMERON) 15 MG tablet  03/26/18   [provider]  OXYGEN Inhale 2 L/min into the lungs. Use via nasal cannula at night and during the day as needed    [provider]  polyethylene glycol powder (GLYCOLAX/MIRALAX) powder MIX AND TAKE 17 GRAMS BY MOUTH prn 01/30/16   [provider]  predniSONE (DELTASONE) 5 MG tablet TAKE 1 TABLET(5 MG) BY MOUTH DAILY WITH BREAKFAST 02/26/18   Glean Hess, MD    No Known Allergies  Family History  Problem Relation Age of Onset  . Brain cancer Father   . Kidney disease Mother   . Colon cancer Brother     Social History Social History   Tobacco Use  . Smoking status: Current Every Day Smoker    Packs/day: 0.25    Years: 61.00    Pack years: 15.25    Types: Cigarettes  . Smokeless tobacco: Never Used  . Tobacco comment: 3 cigrettes a day- cut back  Substance Use Topics  . Alcohol use: No    Alcohol/week: 0.0 standard drinks  . Drug use: No    Review of Systems Constitutional: Negative for fever. ENT: Negative for recent illness/congestion Cardiovascular: Negative for chest pain. Respiratory: Shortness of breath x3 weeks worse with exertion. Gastrointestinal: Negative for abdominal pain, vomiting  Genitourinary: Decreased urination over the past several weeks. Musculoskeletal: Lower extremity edema x3  weeks. Skin: Negative for skin complaints  Neurological: Negative for headache All other ROS negative  ____________________________________________   PHYSICAL EXAM:  VITAL SIGNS: ED Triage Vitals  Enc Vitals Group     BP 05/20/18 1622 131/74     Pulse Rate 05/20/18 1622 80     Resp 05/20/18 1622 (!) 22     Temp 05/20/18 1622 97.7 F (36.5 C)     Temp Source 05/20/18 1622 Oral     SpO2 05/20/18 1622 (!) 83 %     Weight 05/20/18 1623 96 lb 12.5 oz (43.9 kg)     Height 05/20/18 1623 5\' 6"  (1.676 m)     Head Circumference --      Peak Flow --      Pain Score 05/20/18 1623 5     Pain Loc --      Pain Edu? --      Excl. in [place]? --    Constitutional: Alert and oriented. Well appearing and in no distress. Eyes: Normal exam ENT   Head: Normocephalic and atraumatic.    Mouth/Throat: Mucous membranes are moist. Cardiovascular: Normal rate, regular rhythm.  Respiratory: Normal respiratory effort without tachypnea nor retractions. Breath sounds are clear and equal bilaterally.  Gastrointestinal: Soft and nontender. No distention.  Musculoskeletal: Nontender with normal range of motion in all extremities.  Patient has 1-2+ pedal edema.  Largely nontender. Neurologic:  Normal speech and language. No gross focal neurologic deficits  Skin:  Skin is warm, dry and intact.  Psychiatric: Mood and affect are normal.   ____________________________________________    EKG  EKG reviewed and interpreted by myself shows a normal sinus rhythm at 64 bpm with a widened QRS, left axis deviation, largely normal intervals, nonspecific ST changes.  ____________________________________________    RADIOLOGY  Chest x-ray shows right lower lobe opacity  ____________________________________________   INITIAL IMPRESSION / ASSESSMENT AND PLAN / ED COURSE  Pertinent labs & imaging results that were available during my care of the patient were reviewed by me and considered in my medical decision making (see chart for details).  She presents emergency department for 3 weeks of progressive shortness of breath worse with exertion as well as new onset pedal edema.  Differential would include ACS, CHF, significant hypertension, CKD or ARF.  We will check labs, obtain a chest x-ray.  As a secondary complaint patient did fall 1 week ago hitting her head and has had a mild persistent headache ever since.  We will obtain CT imaging of the head as a precaution.  Chest x-ray shows right lower lobe opacity.  BNP elevated to 1055.  Troponin is negative.  Renal function is normal.  EKG largely unchanged from prior.  However patient is requiring oxygen to maintain O2 saturations in the 90s.  States normally she does not wear oxygen at home.  We will start the patient on antibiotics, send blood  cultures and admit to the hospitalist service for further treatment.  May require an echocardiogram as well.  ____________________________________________   FINAL CLINICAL IMPRESSION(S) / ED DIAGNOSES  Fall Shortness of breath Pedal edema Pneumonia   Harvest Dark, MD 05/20/18 1831

## 2018-05-20 NOTE — ED Triage Notes (Signed)
Patient c/o pain and swelling in both her feet and lower legs for the past 3 weeks.  Patient denies recent injury.

## 2018-05-20 NOTE — ED Notes (Addendum)
Pt 75% on room air. Pt placed on 6L nasal cannula. Pt oxygen saturation came up to 95%. Pt oxygen decreased to 3L nasal cannula. Pt currently at 100% oxygen saturation. MD notified.

## 2018-05-20 NOTE — Discharge Instructions (Addendum)
Go directly to emergency room.  

## 2018-05-20 NOTE — ED Notes (Signed)
Pt son requesting to be called with any updates while he is gone. Pt confirmed it was okay for son to be called with updates. Pt's son name is [name] and his number is [phone]

## 2018-05-20 NOTE — H&P (Signed)
[facility] [place] NAME: Tasha Reyes    MR#:  [id number]  DATE OF BIRTH:  [birth year]  DATE OF ADMISSION:  05/20/2018  PRIMARY CARE PHYSICIAN: Glean Hess, MD   REQUESTING/REFERRING PHYSICIAN: Dr. Harvest Dark  CHIEF COMPLAINT: sob   Chief Complaint  Patient presents with  . Shortness of Breath  . Leg Swelling    HISTORY OF PRESENT ILLNESS:  Tasha Reyes  is a 84 y.o. female with a known history of essential hypertension, abdominal aortic aneurysm who was follows up with Dr. Lucky Cowboy comes in with shortness of breath, pedal edema, orthopnea.  Patient went to urgent care today sent here because of pedal edema, hypoxia, patient O2 sat percent on room air and patient was placed on 6 L and sent to emergency room.  Patient has been having shortness of breath, pedal edema for about 3 weeks associated with cough and clear phlegm.  Patient received Lasix 40 mg IV 1 time in the emergency room.  Sats improved, now on 2 L and sats are 95%.  Patient son at bedside, patient is not able to sleep when she is sitting at night because of shortness of breath.  COPD, on 2 L of oxygen at night and daytime also recently. PAST MEDICAL HISTORY:   Past Medical History:  Diagnosis Date  . Aneurysm of iliac artery (HCC) 05/11/2015  . COPD (chronic obstructive pulmonary disease) ([place])   . Cystitis   . History of DVT (deep vein thrombosis) 05/11/2015  . History of peptic ulcer disease 05/11/2015  . [name]   . Hypertension   . Losing weight   . Migraines   . [name]   . Rectal bleeding   . Stroke ([place])   . Vascular disease     PAST SURGICAL HISTOIRY:   Past Surgical History:  Procedure Laterality Date  . ABDOMINAL AORTIC ANEURYSM REPAIR W/ ENDOLUMINAL GRAFT  2008  . ABDOMINAL HYSTERECTOMY    . APPENDECTOMY    . BACK SURGERY    . blood clot      removal right leg  . COLONOSCOPY  2012  .  ESOPHAGOGASTRODUODENOSCOPY  04/2013   gastric ulcers  . FEMORAL-POPLITEAL BYPASS GRAFT Right 2008  . HIP ARTHROPLASTY Right 06/17/2017   Procedure: ARTHROPLASTY BIPOLAR HIP (HEMIARTHROPLASTY);  Surgeon: Earnestine Leys, MD;  Location: [facility] ORS;  Service: Orthopedics;  Laterality: Right;  . JOINT REPLACEMENT Right 06/19/2017   right hip FX.  Marland Kitchen PARTIAL HYSTERECTOMY      SOCIAL HISTORY:   Social History   Tobacco Use  . Smoking status: Current Every Day Smoker    Packs/day: 0.25    Years: 61.00    Pack years: 15.25    Types: Cigarettes  . Smokeless tobacco: Never Used  . Tobacco comment: 3 cigrettes a day- cut back  Substance Use Topics  . Alcohol use: No    Alcohol/week: 0.0 standard drinks    FAMILY HISTORY:   Family History  Problem Relation Age of Onset  . Brain cancer Father   . Kidney disease Mother   . Colon cancer Brother     DRUG ALLERGIES:  No Known Allergies  REVIEW OF SYSTEMS:  CONSTITUTIONAL: No fever, fatigue or weakness.  EYES: No blurred or double vision.  EARS, NOSE, AND THROAT: No tinnitus or ear pain.  RESPIRATORY:  cough, shortness of breath.  CARDIOVASCULAR: No chest pain, orthopnea, PND.  Marland Kitchen  GASTROINTESTINAL: No nausea, vomiting, diarrhea or  abdominal pain.  GENITOURINARY: No dysuria, hematuria.  ENDOCRINE: No polyuria, nocturia,  HEMATOLOGY: No anemia, easy bruising or bleeding SKIN: No rash or lesion. MUSCULOSKELETAL: No joint pain or arthritis.   NEUROLOGIC: No tingling, numbness, weakness.  PSYCHIATRY: No anxiety or depression.   MEDICATIONS AT HOME:   Prior to Admission medications   Medication Sig Start Date End Date Taking? Authorizing Provider  albuterol (PROVENTIL) (2.5 MG/3ML) 0.083% nebulizer solution USE 1 VIAL VIA NEBULIZER EVERY 4 HOURS AS NEEDED FOR WHEEZING OR SHORTNESS OF BREATH Patient taking differently: Take 2.5 mg by nebulization every 4 (four) hours as needed.  11/26/17  Yes Glean Hess, MD  atenolol (TENORMIN) 25  MG tablet TAKE 1 TABLET(25 MG) BY MOUTH TWICE DAILY Patient taking differently: Take 25 mg by mouth 2 (two) times daily.  01/22/18  Yes Glean Hess, MD  atorvastatin (LIPITOR) 10 MG tablet TAKE 1 TABLET BY MOUTH EVERY NIGHT AT BEDTIME Patient taking differently: Take 10 mg by mouth at bedtime.  12/09/17  Yes Glean Hess, MD  Cholecalciferol (VITAMIN D3) 2000 units capsule Take 1 capsule (2,000 Units total) by mouth daily. 10/21/17  Yes Vevelyn Francois, NP  clopidogrel (PLAVIX) 75 MG tablet TAKE 1 TABLET BY MOUTH DAILY Patient taking differently: Take 75 mg by mouth daily.  03/23/18  Yes Dew, Erskine Squibb, MD  FEROSUL 325 (65 Fe) MG tablet TAKE 1 TABLET(325 MG) BY MOUTH DAILY Patient taking differently: Take 325 mg by mouth daily.  01/25/18  Yes Glean Hess, MD  gabapentin (NEURONTIN) 300 MG capsule Take 1 capsule (300 mg total) by mouth at bedtime. 04/22/18 10/19/18 Yes Vevelyn Francois, NP  HYDROcodone-acetaminophen (NORCO/VICODIN) 5-325 MG tablet Take 1 tablet by mouth every 8 (eight) hours as needed for severe pain. 04/27/18 05/27/18 Yes Vevelyn Francois, NP  methocarbamol (ROBAXIN) 750 MG tablet Take 1 tablet (750 mg total) by mouth 3 (three) times daily as needed for muscle spasms. 04/22/18 07/21/18 Yes King, Diona Foley, NP  mirtazapine (REMERON) 15 MG tablet Take 15 mg by mouth at bedtime.  03/26/18  Yes [provider]  HYDROcodone-acetaminophen (NORCO/VICODIN) 5-325 MG tablet Take 1 tablet by mouth every 8 (eight) hours as needed for severe pain. 06/26/18 07/26/18  Vevelyn Francois, NP  HYDROcodone-acetaminophen (NORCO/VICODIN) 5-325 MG tablet Take 1 tablet by mouth every 8 (eight) hours as needed for severe pain. 05/27/18 06/26/18  Vevelyn Francois, NP  OXYGEN Inhale 2 L/min into the lungs. Use via nasal cannula at night and during the day as needed    [provider]  predniSONE (DELTASONE) 5 MG tablet TAKE 1 TABLET(5 MG) BY MOUTH DAILY WITH BREAKFAST Patient not taking:  Reported on 05/20/2018 02/26/18   Glean Hess, MD      VITAL SIGNS:  Blood pressure 135/71, pulse 67, temperature 97.7 F (36.5 C), temperature source Oral, resp. rate 19, height 5\' 6"  (1.676 m), weight 43.9 kg, SpO2 99 %.  PHYSICAL EXAMINATION:  GENERAL:  84 y.o.-year-old patient lying in the bed with no acute distress.  EYES: Pupils equal, round, reactive to light and accommodation. No scleral icterus. Extraocular muscles intact.  HEENT: Head atraumatic, normocephalic. Oropharynx and nasopharynx clear.  NECK:  Supple, no jugular venous distention. No thyroid enlargement, no tenderness.  LUNGS: Has bilateral basilar crepitations present.   CARDIOVASCULAR: S1, S2 normal. No murmurs, rubs, or gallops.  ABDOMEN: Soft, nontender, nondistended. Bowel sounds present. No organomegaly or mass.  EXTREMITIES plus pitting edema up to knees.  NEUROLOGIC: Cranial nerves II through XII are intact. Muscle strength 5/5 in all extremities. Sensation intact. Gait not checked.  PSYCHIATRIC: The patient is alert and oriented x 3.  SKIN: No obvious rash, lesion, or ulcer.   LABORATORY PANEL:   CBC Recent Labs  Lab 05/20/18 1700  WBC 10.3  HGB 14.7  HCT 46.7*  PLT 547*   ------------------------------------------------------------------------------------------------------------------  Chemistries  Recent Labs  Lab 05/20/18 1700  NA 140  K 3.8  CL 100  CO2 32  GLUCOSE 130*  BUN 18  CREATININE 0.85  CALCIUM 9.0  AST 24  ALT 19  ALKPHOS 64  BILITOT 0.7   ------------------------------------------------------------------------------------------------------------------  Cardiac Enzymes Recent Labs  Lab 05/20/18 1700  TROPONINI <0.03   ------------------------------------------------------------------------------------------------------------------  RADIOLOGY:  Dg Chest 2 View  Result Date: 05/20/2018 CLINICAL DATA:  Shortness of breath.  Bilateral leg swelling. EXAM: CHEST -  2 VIEW COMPARISON:  06/16/2017 and 10/04/2016 FINDINGS: There is a small focal area of infiltrate at the right lung base posteriorly as well as a small right effusion. The lungs are otherwise clear. Heart size and pulmonary vascularity are normal. Aortic atherosclerosis. No significant bone abnormality. IMPRESSION: 1. Small focal infiltrate in the right lower lobe posteriorly. 2. Small right pleural effusion. 3.  Aortic Atherosclerosis (ICD10-I70.0). Electronically Signed   By: Lorriane Shire M.D.   On: 05/20/2018 17:51   Ct Head Wo Contrast  Result Date: 05/20/2018 CLINICAL DATA:  [place] last week striking back of head. On blood thinners. Persistent headaches. History of stroke, hypertension, migraines. EXAM: CT HEAD WITHOUT CONTRAST TECHNIQUE: Contiguous axial images were obtained from the base of the skull through the vertex without intravenous contrast. COMPARISON:  None. FINDINGS: BRAIN: No intraparenchymal hemorrhage, mass effect nor midline shift. Moderate ventriculomegaly with disproportionate sulcal effacement at the convexities. Confluent supratentorial white matter hypodensities. No acute large vascular territory infarcts. No abnormal extra-axial fluid collections. Basal cisterns are patent. VASCULAR: Moderate calcific atherosclerosis of the carotid siphons. SKULL: No skull fracture. Advanced temporomandibular osteoarthrosis. No significant scalp soft tissue swelling. SINUSES/ORBITS: Paranasal sinuses are well aerated. Mastoid air cells are well aerated.The included ocular globes and orbital contents are non-suspicious. OTHER: None. IMPRESSION: 1. No acute intracranial process. 2. Moderate to severe chronic small vessel ischemic changes. 3. Moderate parenchymal brain volume loss with image findings of normal pressure hydrocephalus. Electronically Signed   By: Elon Alas M.D.   On: 05/20/2018 18:13    EKG:   Orders placed or performed during the hospital encounter of 05/20/18  . ED EKG  .  ED EKG  . EKG 12-Lead  . EKG 12-Lead  EKG shows normal sinus rhythm at 64 bpm, widened QRS, left axis deviation nonspecific ST-T changes.  IMPRESSION AND PLAN:   84 year old female patient with essential hypertension, abdominal aortic aneurysm comes in because of 3weeks progressive shortness of breath with exertion, pedal edema, patient had a fall a week ago and hit her head and had success persistent headache after that.  Patienthaselevated BNP at 1055. Mental and plan 1.  Acute chronic respiratory failure with hypoxia secondary to CHF exacerbation: No previous echoes, patient initial O2 sats 70% on room air, put on 6 L and brought to ER from urgent care now patient received Lasix in the ER, now on 2 L of oxygen and sats around 95%.  Admit to telemetry, continue IV Lasix 40 mg twice daily, check daily weights, echocardiogram is ordered to evaluate. 2.  Atypical pneumonia: Patient has no fever or white count.  Will add Zithromax. 3.  History of abdominal aortic aneurysm without rupture: Patient follows up with Dr. Lucky Cowboy.  Saw him recently on October 4. 4 depression, neuropathy patient is on Remeron, Neurontin. #5 history of chronic respiratory failure, COPD: No wheezing today.  Continue bronchodilators. 6.  Essential hypertension: Controlled, continue beta-blockers,  #7.  History of PVD, and is on statins, Plavix: Continue them. All the records are reviewed and case discussed with ED provider. Management plans discussed with the patient, family and they are in agreement.  CODE STATUS: Full code TOTAL TIME TAKING CARE OF THIS PATIENT: 55 minutes.    Epifanio Lesches M.D on 05/20/2018 at 7:28 PM  Between 7am to 6pm - Pager - [phone]  After 6pm go to [email or web address] - password [email or web address] [place] [facility]  Office  [phone]  CC: Primary care physician; Glean Hess, MD  Note: This dictation was prepared with Dragon dictation along with smaller phrase technology.  Any transcriptional errors that result from this process are unintentional.

## 2018-05-20 NOTE — ED Provider Notes (Signed)
[facility] ____________________________________________  Time seen: Approximately 4:05 PM  I have reviewed the triage vital signs and the nursing notes.   HISTORY  Chief Complaint Leg Swelling (both feet and legs)   HPI Tasha Reyes is a 84 y.o. female past medical history of AAA, hypertension, CVA, COPD/emphysema, with intermittent home oxygen use presenting with son at bedside for evaluation of bilateral lower extremity edema present for the last 3 weeks.  Patient reports edema has continued to worsen regardless of activity or elevation.  Reports accompanying this having generalized increase of fatigue, intermittent shortness of breath, some coughing and some wheezing.  Does intermittently use oxygen at home, not always.  States oxygen at rest when asymptomatic is 90-93, but more recently has been in the 80s.  Son reports oxygen recently has dropped even to 78%.  Denies known CHF.  Denies any fevers, abdominal pain, chest pain.  Does not resolve with elevation.  Denies other alleviating measures attempted.  Patient requesting fluid pill to assist with leg swelling.  Glean Hess, MD: PCP   Past Medical History:  Diagnosis Date  . Aneurysm of iliac artery (HCC) 05/11/2015  . COPD (chronic obstructive pulmonary disease) ([place])   . Cystitis   . History of DVT (deep vein thrombosis) 05/11/2015  . History of peptic ulcer disease 05/11/2015  . [name]   . Hypertension   . Losing weight   . Migraines   . [name]   . Rectal bleeding   . Stroke ([place])   . Vascular disease     Patient Active Problem List   Diagnosis Date Noted  . Chronic pain syndrome 03/02/2018  . Chronic bilateral low back pain 02/24/2018  . Bilateral leg numbness 02/24/2018  . Accidental medication overdose 02/22/2018  . DDD (degenerative disc disease), lumbar 02/03/2018  . Chronic anticoagulation (Plavix) 02/03/2018  . Chronic lower extremity pain  (Bilateral) (R>L) 01/20/2018  . Disorder of skeletal system 01/20/2018  . Chronic hip pain (Right) 10/21/2017  . Rectal bleeding 09/06/2017  . History of repair of hip fracture 06/16/2017  . AAA (abdominal aortic aneurysm) without rupture ([place]) 03/18/2017  . Edema due to malnutrition ([place]) 10/28/2016  . Dependence on supplemental oxygen 09/04/2016  . Protein-calorie malnutrition, severe 08/20/2016  . Vitamin D deficiency 01/10/2016  . Opioid-induced constipation ([facility]IC) 11/07/2015  . Osteoporosis, post-menopausal 11/07/2015  . Complaints of weakness of lower extremities (Bilateral) 11/07/2015  . Lumbar foraminal stenosis (Right) (Severe at L4-5) 06/20/2015  . Chronic lower extremity pain (Location of Primary Source of Pain) (Right) 05/11/2015  . Lumbosacral Radiculopathy (Right) 05/11/2015  . Chronic radicular pain of lower extremity (Right) 05/11/2015  . Peripheral vascular disease ([place]) 05/11/2015  . Hx of long term use of blood thinners (Plavix) 05/11/2015  . Lower extremity pain (Right) 05/11/2015  . Neurogenic pain 05/11/2015  . History of DVT (deep vein thrombosis) 05/11/2015  . Lumbar facet syndrome (Bilateral) 05/11/2015  . Dyslipidemia 12/23/2014  . Compulsive tobacco user syndrome 12/23/2014  . History of bleeding peptic ulcer 12/23/2014  . Essential (primary) hypertension 12/23/2014  . Depression, major, single episode, complete remission ([place]) 12/23/2014  . COPD (chronic obstructive pulmonary disease) ([place]) 12/23/2014  . Restless leg 12/23/2014  . Chronic tension-type headache, intractable 12/23/2014    Past Surgical History:  Procedure Laterality Date  . ABDOMINAL AORTIC ANEURYSM REPAIR W/ ENDOLUMINAL GRAFT  2008  . ABDOMINAL HYSTERECTOMY    . APPENDECTOMY    . BACK SURGERY    . blood  clot      removal right leg  . COLONOSCOPY  2012  . ESOPHAGOGASTRODUODENOSCOPY  04/2013   gastric ulcers  . FEMORAL-POPLITEAL BYPASS GRAFT Right 2008  . HIP ARTHROPLASTY Right  06/17/2017   Procedure: ARTHROPLASTY BIPOLAR HIP (HEMIARTHROPLASTY);  Surgeon: Earnestine Leys, MD;  Location: [facility] ORS;  Service: Orthopedics;  Laterality: Right;  . JOINT REPLACEMENT Right 06/19/2017   right hip FX.  Marland Kitchen PARTIAL HYSTERECTOMY       No current facility-administered medications for this encounter.   Current Outpatient Medications:  .  atenolol (TENORMIN) 25 MG tablet, TAKE 1 TABLET(25 MG) BY MOUTH TWICE DAILY, Disp: 60 tablet, Rfl: 5 .  atorvastatin (LIPITOR) 10 MG tablet, TAKE 1 TABLET BY MOUTH EVERY NIGHT AT BEDTIME, Disp: 90 tablet, Rfl: 3 .  Cholecalciferol (VITAMIN D3) 2000 units capsule, Take 1 capsule (2,000 Units total) by mouth daily., Disp: 30 capsule, Rfl: 12 .  clopidogrel (PLAVIX) 75 MG tablet, TAKE 1 TABLET BY MOUTH DAILY, Disp: 90 tablet, Rfl: 4 .  FEROSUL 325 (65 Fe) MG tablet, TAKE 1 TABLET(325 MG) BY MOUTH DAILY, Disp: 30 tablet, Rfl: 5 .  gabapentin (NEURONTIN) 300 MG capsule, Take 1 capsule (300 mg total) by mouth at bedtime., Disp: 90 capsule, Rfl: 1 .  [START ON 06/26/2018] HYDROcodone-acetaminophen (NORCO/VICODIN) 5-325 MG tablet, Take 1 tablet by mouth every 8 (eight) hours as needed for severe pain., Disp: 90 tablet, Rfl: 0 .  methocarbamol (ROBAXIN) 750 MG tablet, Take 1 tablet (750 mg total) by mouth 3 (three) times daily as needed for muscle spasms., Disp: 90 tablet, Rfl: 2 .  predniSONE (DELTASONE) 5 MG tablet, TAKE 1 TABLET(5 MG) BY MOUTH DAILY WITH BREAKFAST, Disp: 30 tablet, Rfl: 0 .  albuterol (PROVENTIL) (2.5 MG/3ML) 0.083% nebulizer solution, USE 1 VIAL VIA NEBULIZER EVERY 4 HOURS AS NEEDED FOR WHEEZING OR SHORTNESS OF BREATH, Disp: 75 mL, Rfl: 0 .  [START ON 05/27/2018] HYDROcodone-acetaminophen (NORCO/VICODIN) 5-325 MG tablet, Take 1 tablet by mouth every 8 (eight) hours as needed for severe pain., Disp: 90 tablet, Rfl: 0 .  HYDROcodone-acetaminophen (NORCO/VICODIN) 5-325 MG tablet, Take 1 tablet by mouth every 8 (eight) hours as needed for  severe pain., Disp: 90 tablet, Rfl: 0 .  mirtazapine (REMERON) 15 MG tablet, , Disp: , Rfl: 5 .  OXYGEN, Inhale 2 L/min into the lungs. Use via nasal cannula at night and during the day as needed, Disp: , Rfl:  .  polyethylene glycol powder (GLYCOLAX/MIRALAX) powder, MIX AND TAKE 17 GRAMS BY MOUTH prn, Disp: , Rfl: 5  Allergies Patient has no known allergies.  Family History  Problem Relation Age of Onset  . Brain cancer Father   . Kidney disease Mother   . Colon cancer Brother     Social History Social History   Tobacco Use  . Smoking status: Current Every Day Smoker    Packs/day: 0.25    Years: 61.00    Pack years: 15.25    Types: Cigarettes  . Smokeless tobacco: Never Used  . Tobacco comment: 3 cigrettes a day- cut back  Substance Use Topics  . Alcohol use: No    Alcohol/week: 0.0 standard drinks  . Drug use: No    Review of Systems Constitutional: No fever Cardiovascular: Denies chest pain. Respiratory: positive shortness of breath. Gastrointestinal: No abdominal pain.   Musculoskeletal: Leg swelling ____________________________________________   PHYSICAL EXAM:  VITAL SIGNS: ED Triage Vitals  Enc Vitals Group     BP 05/20/18 1519 (!) 144/85  Pulse Rate 05/20/18 1519 76     Resp 05/20/18 1519 16     Temp 05/20/18 1519 98.2 F (36.8 C)     Temp Source 05/20/18 1519 Oral     SpO2 05/20/18 1519 (!) 81 %     Weight 05/20/18 1514 97 lb (44 kg)     Height 05/20/18 1514 5\' 6"  (1.676 m)     Head Circumference --      Peak Flow --      Pain Score 05/20/18 1514 5     Pain Loc --      Pain Edu? --      Excl. in [place]? --     Constitutional: Alert and oriented. Well appearing and in no acute distress. ENT      Head: Normocephalic and atraumatic. Cardiovascular: Normal rate, regular rhythm. Grossly normal heart sounds.  Good peripheral circulation. Respiratory: Normal respiratory effort without tachypnea nor retractions.  Diminished breath sounds bilateral  bases.  Scattered coarse rhonchi and expiratory wheezes bilateral upper lobes.  Speaks in complete sentences. Musculoskeletal: 2+ pitting pedal edema bilaterally with 1+ bilateral pitting edema lower extremities. Neurologic:  Normal speech and language.  Speech is normal. No gait instability.  Skin:  Skin is warm, dry. Psychiatric: Mood and affect are normal. Speech and behavior are normal. Patient exhibits appropriate insight and judgment   ___________________________________________   LABS (all labs ordered are listed, but only abnormal results are displayed)  Labs Reviewed - No data to display ____________________________________________   PROCEDURES Procedures    INITIAL IMPRESSION / ASSESSMENT AND PLAN / ED COURSE  Pertinent labs & imaging results that were available during my care of the patient were reviewed by me and considered in my medical decision making (see chart for details).  Patient alert and oriented with decisional capacity.  Son at bedside.  Presenting for evaluation of lower extremity edema.  Patient expressed concern and request of fluid pill, however discussed in detail current circumstance is more involved.  Suspect CHF with CHF exacerbation.  Patient hypoxic in urgent care.  Recommend further evaluation in emergency room at this time, including EMS transfer.  Patient refuses EMS transfer and declines any intervention in urgent care at this time.  Patient initially refused to be evaluated in ER, but then consents but only by son transport.  Patient states that her son will take her directly to [place] [facility].  Amy RN triage nurse called and given report.  Patient verbalized understanding of risks including self transport. ____________________________________________   FINAL CLINICAL IMPRESSION(S) / ED DIAGNOSES  Final diagnoses:  Hypoxia  Leg edema     ED Discharge Orders    None       Note: This dictation was prepared with Dragon dictation along  with smaller phrase technology. Any transcriptional errors that result from this process are unintentional.         Marylene Land, NP 05/20/18 1619

## 2018-05-21 ENCOUNTER — Inpatient Hospital Stay
Admit: 2018-05-21 | Discharge: 2018-05-21 | Disposition: A | Discharge status: Home / Self Care | Payer: Medicare Other | Attending: Internal Medicine | Admitting: Internal Medicine

## 2018-05-21 LAB — BASIC METABOLIC PANEL
ANION GAP: 9 (ref 5–15)
BUN: 14 mg/dL (ref 8–23)
CALCIUM: 8.4 mg/dL — AB (ref 8.9–10.3)
CO2: 34 mmol/L — ABNORMAL HIGH (ref 22–32)
CREATININE: 0.83 mg/dL (ref 0.44–1.00)
Chloride: 100 mmol/L (ref 98–111)
GFR calc Af Amer: >60 mL/min (ref >60)
GLUCOSE: 83 mg/dL (ref 70–99)
Potassium: 3.5 mmol/L (ref 3.5–5.1)
Sodium: 143 mmol/L (ref 135–145)

## 2018-05-21 LAB — CBC
HCT: 42.1 % (ref 36.0–46.0)
Hemoglobin: 13 g/dL (ref 12.0–15.0)
MCH: 26.7 pg (ref 26.0–34.0)
MCHC: 30.9 g/dL (ref 30.0–36.0)
MCV: 86.6 fL (ref 80.0–100.0)
PLATELETS: 518 10*3/uL — AB (ref 150–400)
RBC: 4.86 MIL/uL (ref 3.87–5.11)
RDW: 20.4 % — AB (ref 11.5–15.5)
WBC: 8.5 10*3/uL (ref 4.0–10.5)
nRBC: 0 % (ref 0.0–0.2)

## 2018-05-21 LAB — ECHOCARDIOGRAM COMPLETE
Height: 66 in
WEIGHTICAEL: 1528 [oz_av]

## 2018-05-21 LAB — GLUCOSE, CAPILLARY: GLUCOSE-CAPILLARY: 84 mg/dL (ref 70–99)

## 2018-05-21 MED ORDER — GABAPENTIN 300 MG PO CAPS
300.0000 mg | ORAL_CAPSULE | Freq: Every day | ORAL | Status: DC
  Administered 2018-05-21 – 2018-05-22 (×2): 300 mg via ORAL
  Filled 2018-05-21 (×2): qty 1

## 2018-05-21 MED ORDER — SODIUM CHLORIDE 0.9% FLUSH
3.0000 mL | Freq: Two times a day (BID) | INTRAVENOUS | Status: DC
  Administered 2018-05-21 – 2018-05-23 (×4): 3 mL via INTRAVENOUS

## 2018-05-21 MED ORDER — VITAMIN D3 25 MCG (1000 UNIT) PO TABS
2000.0000 [IU] | ORAL_TABLET | Freq: Every day | ORAL | Status: DC
  Administered 2018-05-22 – 2018-05-23 (×2): 2000 [IU] via ORAL
  Filled 2018-05-21 (×2): qty 2

## 2018-05-21 MED ORDER — POTASSIUM CHLORIDE CRYS ER 20 MEQ PO TBCR
20.0000 meq | EXTENDED_RELEASE_TABLET | Freq: Two times a day (BID) | ORAL | Status: DC
  Administered 2018-05-21 – 2018-05-23 (×5): 20 meq via ORAL
  Filled 2018-05-21 (×4): qty 1

## 2018-05-21 MED ORDER — ENOXAPARIN SODIUM 30 MG/0.3ML ~~LOC~~ SOLN
30.0000 mg | SUBCUTANEOUS | Status: DC
  Administered 2018-05-21 – 2018-05-22 (×2): 30 mg via SUBCUTANEOUS
  Filled 2018-05-21 (×2): qty 0.3

## 2018-05-21 NOTE — Progress Notes (Signed)
Pt still complaining of bilateral leg pain. RN will give another hydrocodone. I will continue to assess.

## 2018-05-21 NOTE — Progress Notes (Signed)
SATURATION QUALIFICATIONS: (This note is used to comply with regulatory documentation for home oxygen)  Patient Saturations on Room Air at Rest = 95%  Patient Saturations on Room Air while Ambulating = 85%  Patient Saturations on 2 Liters of oxygen while resting = 95%  Please briefly explain why patient needs home oxygen: Pt has oxygen at home as needed. 2-3L per [place].

## 2018-05-21 NOTE — Progress Notes (Signed)
RN and NT attempt to ambulate patient around nurses station. Pt is very weak and request to sit down by time we reached the door to the room. Pt was returned to her bed without walking any further. Pt desats on RA with ambulation. I will continue to assess.

## 2018-05-21 NOTE — Plan of Care (Signed)

## 2018-05-21 NOTE — Progress Notes (Signed)
*  PRELIMINARY RESULTS* Echocardiogram 2D Echocardiogram has been performed.  Tasha Reyes 05/21/2018, 11:21 AM

## 2018-05-22 DIAGNOSIS — E440 Moderate protein-calorie malnutrition: Secondary | ICD-10-CM

## 2018-05-22 LAB — BASIC METABOLIC PANEL
ANION GAP: 11 (ref 5–15)
BUN: 17 mg/dL (ref 8–23)
CO2: 33 mmol/L — AB (ref 22–32)
Calcium: 8.5 mg/dL — ABNORMAL LOW (ref 8.9–10.3)
Chloride: 100 mmol/L (ref 98–111)
Creatinine, Ser: 1.06 mg/dL — ABNORMAL HIGH (ref 0.44–1.00)
GFR calc Af Amer: 54 mL/min — ABNORMAL LOW (ref >60)
GFR calc non Af Amer: 47 mL/min — ABNORMAL LOW (ref >60)
Glucose, Bld: 74 mg/dL (ref 70–99)
POTASSIUM: 4.5 mmol/L (ref 3.5–5.1)
Sodium: 144 mmol/L (ref 135–145)

## 2018-05-22 MED ORDER — IPRATROPIUM-ALBUTEROL 0.5-2.5 (3) MG/3ML IN SOLN: 3.0000 mL | Freq: Four times a day (QID) | RESPIRATORY_TRACT | Status: DC | PRN

## 2018-05-22 MED ORDER — IPRATROPIUM-ALBUTEROL 0.5-2.5 (3) MG/3ML IN SOLN
3.0000 mL | Freq: Two times a day (BID) | RESPIRATORY_TRACT | Status: DC
  Administered 2018-05-22 – 2018-05-23 (×2): 3 mL via RESPIRATORY_TRACT
  Filled 2018-05-22 (×2): qty 3

## 2018-05-22 NOTE — Care Management Important Message (Signed)
Copy of signed IM left with patient in room.  

## 2018-05-22 NOTE — Progress Notes (Signed)
Initial Nutrition Assessment  DOCUMENTATION CODES:   Non-severe (moderate) malnutrition in context of chronic illness, Underweight  INTERVENTION:  -Magic cup TID with meals, each supplement provides 290 kcal and 9 grams of protein Pt prefers chocolate -Snacks   NUTRITION DIAGNOSIS:   Severe Malnutrition related to chronic illness(COPD, emphysema) as evidenced by moderate fat depletion, moderate muscle depletion, energy intake < or equal to 75% for > or equal to 1 month.   GOAL:   Patient will meet greater than or equal to 90% of their needs   MONITOR:   PO intake, Supplement acceptance, Weight trends, Labs  REASON FOR ASSESSMENT:   Malnutrition Screening Tool(low BMI)    ASSESSMENT:    84 y.o. female past medical history of AAA, hypertension, CVA, COPD/emphysema, with intermittent home oxygen use presenting with son at bedside for evaluation of bilateral lower extremity edema present for the last 3 weeks.    Pt sitting up in bed w/ 25% of lunch tray consumed. Pt reports not being hungry and had been given a late breakfast. Pt stated she has experienced LOA over the past few months. Pt reports preparing  2 meals/day and occasionally she will have 3. Pt recalls breakfast as usually bacon and eggs or sausage and toast w/ butter and coffee. When RD asked about lunch and dinner the pt replied with I don't know, just whatever I feel like.   Pt recalls UBW of 130lb 9yrs ago and weighing 120lbs 86mo ago. Pt denies issues w/ chewing/swallowing or recent n/v. Pt stated she had 1 episode of diarrhea w/in the previous hr of visit.   Pt stated that she does not like any of the nutrition drinks, and denied offer of EE from RD, but expressed interest in [employer] and inquired about outside [place] should she like it.   Medications: zithromax, vitD, Colace, Ferrous Sulfate, Gabapentin, Remeron, Potassium chloride 4mEq tablet, Hydrocodone Labs: Cr 1.06 (H)  NUTRITION -  FOCUSED PHYSICAL EXAM:    Most Recent Value  Orbital Region  Moderate depletion  Upper Arm Region  Moderate depletion  Thoracic and Lumbar Region  Moderate depletion  Buccal Region  Moderate depletion  Temple Region  Moderate depletion  Clavicle Bone Region  Severe depletion  Clavicle and Acromion Bone Region  Severe depletion  Scapular Bone Region  Moderate depletion  Dorsal Hand  Moderate depletion  Patellar Region  Unable to assess  Anterior Thigh Region  Unable to assess  Posterior Calf Region  Moderate depletion  Edema (RD Assessment)  None  Hair  Reviewed  Eyes  Reviewed  Mouth  Reviewed  Skin  Reviewed  Nails  Reviewed       Diet Order:   Diet Order            Diet regular Room service appropriate? Yes; Fluid consistency: Thin  Diet effective now              EDUCATION NEEDS:   No education needs have been identified at this time  Skin:  Skin Assessment: Reviewed RN Assessment(Dry; flaky)  Last BM:  11/6 - WDL  Height:   Ht Readings from Last 1 Encounters:  05/21/18 5\' 6"  (1.676 m)    Weight:   Wt Readings from Last 1 Encounters:  05/22/18 40.6 kg   05/20/18 44 kg  04/22/18 45.4 kg  04/17/18 45.4 kg  03/02/18 44 kg  02/03/18 44 kg  01/20/18 44 kg     Ideal Body Weight:  59.1  kg  BMI:  Body mass index is 14.45 kg/m.  Estimated Nutritional Needs:   Kcal:  1136-1300  Protein:  53-65g  Fluid:  1.1-1.3L    Lajuan Lines, RD, LDN  After Hours/Weekend Pager: [phone]

## 2018-05-22 NOTE — Progress Notes (Signed)
[place] [facility] [place] NAME: Tasha Reyes    MR#:  [id number]  DATE OF BIRTH:  Aug 28, 1930  SUBJECTIVE:  CHIEF COMPLAINT:   Chief Complaint  Patient presents with  . Shortness of Breath  . Leg Swelling   Came with leg swelling, worsening SOB on exertion.  Feels slightly better now. Still hypoxic and SOB while walking to in room bathroom  REVIEW OF SYSTEMS:  CONSTITUTIONAL: No fever, fatigue or weakness.  EYES: No blurred or double vision.  EARS, NOSE, AND THROAT: No tinnitus or ear pain.  RESPIRATORY: No cough, have shortness of breath,no  wheezing or hemoptysis.  CARDIOVASCULAR: No chest pain,have orthopnea, edema.  GASTROINTESTINAL: No nausea, vomiting, diarrhea or abdominal pain.  GENITOURINARY: No dysuria, hematuria.  ENDOCRINE: No polyuria, nocturia,  HEMATOLOGY: No anemia, easy bruising or bleeding SKIN: No rash or lesion. MUSCULOSKELETAL: No joint pain or arthritis.   NEUROLOGIC: No tingling, numbness, weakness.  PSYCHIATRY: No anxiety or depression.   ROS  DRUG ALLERGIES:  No Known Allergies  VITALS:  Blood pressure 125/63, pulse 77, temperature 97.8 F (36.6 C), temperature source Oral, resp. rate 18, height 5\' 6"  (1.676 m), weight 40.6 kg, SpO2 96 %.  PHYSICAL EXAMINATION:  GENERAL:  84 y.o.-year-old patient lying in the bed with no acute distress.  EYES: Pupils equal, round, reactive to light and accommodation. No scleral icterus. Extraocular muscles intact.  HEENT: Head atraumatic, normocephalic. Oropharynx and nasopharynx clear.  NECK:  Supple, no jugular venous distention. No thyroid enlargement, no tenderness.  LUNGS: Normal breath sounds bilaterally, no wheezing, have crepitation. No use of accessory muscles of respiration.  CARDIOVASCULAR: S1, S2 normal. No murmurs, rubs, or gallops.  ABDOMEN: Soft, nontender, nondistended. Bowel sounds present. No organomegaly or mass.  EXTREMITIES: some pedal edema,no  cyanosis, or clubbing.  NEUROLOGIC: Cranial nerves II through XII are intact. Muscle strength 5/5 in all extremities. Sensation intact. Gait not checked.  PSYCHIATRIC: The patient is alert and oriented x 3.  SKIN: No obvious rash, lesion, or ulcer.   Physical Exam LABORATORY PANEL:   CBC Recent Labs  Lab 05/21/18 0445  WBC 8.5  HGB 13.0  HCT 42.1  PLT 518*   ------------------------------------------------------------------------------------------------------------------  Chemistries  Recent Labs  Lab 05/20/18 1700  05/22/18 0408  NA 140   < > 144  K 3.8   < > 4.5  CL 100   < > 100  CO2 32   < > 33*  GLUCOSE 130*   < > 74  BUN 18   < > 17  CREATININE 0.85   < > 1.06*  CALCIUM 9.0   < > 8.5*  AST 24  --   --   ALT 19  --   --   ALKPHOS 64  --   --   BILITOT 0.7  --   --    < > = values in this interval not displayed.   ------------------------------------------------------------------------------------------------------------------  Cardiac Enzymes Recent Labs  Lab 05/20/18 1700  TROPONINI <0.03   ------------------------------------------------------------------------------------------------------------------  RADIOLOGY:  No results found.  ASSESSMENT AND PLAN:   Active Problems:   Acute on chronic respiratory failure (HCC)   Malnutrition of moderate degree  84 year old female patient with essential hypertension, abdominal aortic aneurysm comes in because of 3weeks progressive shortness of breath with exertion, pedal edema, patient had a fall a week ago and hit her head and had success persistent headache after that.  Patient has elevated BNP at 1055.   *  Acute chronic respiratory failure with hypoxia secondary to ac diastolic CHF exacerbation:  Reviewed echo, patient initial O2 sats 70% on room air,  on 6 L and brought to ER from urgent care now patient received Lasix in the ER, now on 2 L of oxygen and sats around 95%.  continue IV Lasix 40 mg twice  daily, check daily weights, may need Cont Oxygen at discharge.  *  Atypical pneumonia: Patient has no fever or white count.    add Zithromax.  *  History of abdominal aortic aneurysm without rupture: Patient follows up with Dr. Lucky Cowboy.  Saw him recently on October 4.  * depression, neuropathy patient is on Remeron, Neurontin.  * history of chronic respiratory failure, COPD: No wheezing today.  Continue bronchodilators.  * Essential hypertension: Controlled, continue beta-blockers,  *   History of PVD, and is on statins, Plavix: Continue them.   All the records are reviewed and case discussed with Care Management/Social Workerr. Management plans discussed with the patient, family and they are in agreement.  CODE STATUS: full.  TOTAL TIME TAKING CARE OF THIS PATIENT: 35 minutes.    POSSIBLE D/C IN 1-2 DAYS, DEPENDING ON CLINICAL CONDITION.   Vaughan Basta M.D on 05/22/2018   Between 7am to 6pm - Pager - [phone]  After 6pm go to [email or web address] - password [email or web address] [facility]  Office  [phone]  CC: Primary care physician; Glean Hess, MD  Note: This dictation was prepared with Dragon dictation along with smaller phrase technology. Any transcriptional errors that result from this process are unintentional.

## 2018-05-22 NOTE — Progress Notes (Signed)
Explained to patient that she needs to wear oxygen at all times until her condition improves. Pt say," I am not". Reinfored importance of wearing oxygen when her oxygen is low. Pt states she dont care and is not going to wear oxygen all the time. I will continue to assess.

## 2018-05-22 NOTE — Plan of Care (Signed)
Patient is sleeping through most of the shift. Encourage patient to get up to chair to for meals and ambulate within the room.

## 2018-05-22 NOTE — Progress Notes (Signed)
SATURATION QUALIFICATIONS: (This note is used to comply with regulatory documentation for home oxygen)  Patient Saturations on Room Air at Rest = 85%  Patient Saturations on Room Air while Ambulating = n/a%  Patient Saturations on 2 Liters of oxygen while restting = 90%  Please briefly explain why patient needs home oxygen:pt has oxygen at home 2-3L prn

## 2018-05-22 NOTE — Progress Notes (Signed)
[facility] [place] NAME: Tasha Reyes    MR#:  [id number]  DATE OF BIRTH:  February 25, 1931  SUBJECTIVE:  CHIEF COMPLAINT:   Chief Complaint  Patient presents with  . Shortness of Breath  . Leg Swelling   Came with leg swelling, worsening SOB on exertion.  Feels slightly better now.  REVIEW OF SYSTEMS:  CONSTITUTIONAL: No fever, fatigue or weakness.  EYES: No blurred or double vision.  EARS, NOSE, AND THROAT: No tinnitus or ear pain.  RESPIRATORY: No cough, have shortness of breath,no  wheezing or hemoptysis.  CARDIOVASCULAR: No chest pain,have orthopnea, edema.  GASTROINTESTINAL: No nausea, vomiting, diarrhea or abdominal pain.  GENITOURINARY: No dysuria, hematuria.  ENDOCRINE: No polyuria, nocturia,  HEMATOLOGY: No anemia, easy bruising or bleeding SKIN: No rash or lesion. MUSCULOSKELETAL: No joint pain or arthritis.   NEUROLOGIC: No tingling, numbness, weakness.  PSYCHIATRY: No anxiety or depression.   ROS  DRUG ALLERGIES:  No Known Allergies  VITALS:  Blood pressure 134/67, pulse 66, temperature 98.3 F (36.8 C), temperature source Oral, resp. rate 18, height 5\' 6"  (1.676 m), weight 40.6 kg, SpO2 95 %.  PHYSICAL EXAMINATION:  GENERAL:  84 y.o.-year-old patient lying in the bed with no acute distress.  EYES: Pupils equal, round, reactive to light and accommodation. No scleral icterus. Extraocular muscles intact.  HEENT: Head atraumatic, normocephalic. Oropharynx and nasopharynx clear.  NECK:  Supple, no jugular venous distention. No thyroid enlargement, no tenderness.  LUNGS: Normal breath sounds bilaterally, no wheezing, have crepitation. No use of accessory muscles of respiration.  CARDIOVASCULAR: S1, S2 normal. No murmurs, rubs, or gallops.  ABDOMEN: Soft, nontender, nondistended. Bowel sounds present. No organomegaly or mass.  EXTREMITIES: some pedal edema,no cyanosis, or clubbing.  NEUROLOGIC: Cranial nerves II through  XII are intact. Muscle strength 5/5 in all extremities. Sensation intact. Gait not checked.  PSYCHIATRIC: The patient is alert and oriented x 3.  SKIN: No obvious rash, lesion, or ulcer.   Physical Exam LABORATORY PANEL:   CBC Recent Labs  Lab 05/21/18 0445  WBC 8.5  HGB 13.0  HCT 42.1  PLT 518*   ------------------------------------------------------------------------------------------------------------------  Chemistries  Recent Labs  Lab 05/20/18 1700  05/22/18 0408  NA 140   < > 144  K 3.8   < > 4.5  CL 100   < > 100  CO2 32   < > 33*  GLUCOSE 130*   < > 74  BUN 18   < > 17  CREATININE 0.85   < > 1.06*  CALCIUM 9.0   < > 8.5*  AST 24  --   --   ALT 19  --   --   ALKPHOS 64  --   --   BILITOT 0.7  --   --    < > = values in this interval not displayed.   ------------------------------------------------------------------------------------------------------------------  Cardiac Enzymes Recent Labs  Lab 05/20/18 1700  TROPONINI <0.03   ------------------------------------------------------------------------------------------------------------------  RADIOLOGY:  Dg Chest 2 View  Result Date: 05/20/2018 CLINICAL DATA:  Shortness of breath.  Bilateral leg swelling. EXAM: CHEST - 2 VIEW COMPARISON:  06/16/2017 and 10/04/2016 FINDINGS: There is a small focal area of infiltrate at the right lung base posteriorly as well as a small right effusion. The lungs are otherwise clear. Heart size and pulmonary vascularity are normal. Aortic atherosclerosis. No significant bone abnormality. IMPRESSION: 1. Small focal infiltrate in the right lower lobe posteriorly. 2. Small right pleural effusion.  3.  Aortic Atherosclerosis (ICD10-I70.0). Electronically Signed   By: Lorriane Shire M.D.   On: 05/20/2018 17:51   Ct Head Wo Contrast  Result Date: 05/20/2018 CLINICAL DATA:  [place] last week striking back of head. On blood thinners. Persistent headaches. History of stroke, hypertension,  migraines. EXAM: CT HEAD WITHOUT CONTRAST TECHNIQUE: Contiguous axial images were obtained from the base of the skull through the vertex without intravenous contrast. COMPARISON:  None. FINDINGS: BRAIN: No intraparenchymal hemorrhage, mass effect nor midline shift. Moderate ventriculomegaly with disproportionate sulcal effacement at the convexities. Confluent supratentorial white matter hypodensities. No acute large vascular territory infarcts. No abnormal extra-axial fluid collections. Basal cisterns are patent. VASCULAR: Moderate calcific atherosclerosis of the carotid siphons. SKULL: No skull fracture. Advanced temporomandibular osteoarthrosis. No significant scalp soft tissue swelling. SINUSES/ORBITS: Paranasal sinuses are well aerated. Mastoid air cells are well aerated.The included ocular globes and orbital contents are non-suspicious. OTHER: None. IMPRESSION: 1. No acute intracranial process. 2. Moderate to severe chronic small vessel ischemic changes. 3. Moderate parenchymal brain volume loss with image findings of normal pressure hydrocephalus. Electronically Signed   By: Elon Alas M.D.   On: 05/20/2018 18:13    ASSESSMENT AND PLAN:   Active Problems:   Acute on chronic respiratory failure ([place])  84 year old female patient with essential hypertension, abdominal aortic aneurysm comes in because of 3weeks progressive shortness of breath with exertion, pedal edema, patient had a fall a week ago and hit her head and had success persistent headache after that.  Patient has elevated BNP at 1055.   *  Acute chronic respiratory failure with hypoxia secondary to ac diastolic CHF exacerbation:  Reviewed echo, patient initial O2 sats 70% on room air,  on 6 L and brought to ER from urgent care now patient received Lasix in the ER, now on 2 L of oxygen and sats around 95%.  continue IV Lasix 40 mg twice daily, check daily weights, may need Cont Oxygen at discharge.  *  Atypical pneumonia: Patient  has no fever or white count.    add Zithromax.  *  History of abdominal aortic aneurysm without rupture: Patient follows up with Dr. Lucky Cowboy.  Saw him recently on October 4.  * depression, neuropathy patient is on Remeron, Neurontin.  * history of chronic respiratory failure, COPD: No wheezing today.  Continue bronchodilators.  * Essential hypertension: Controlled, continue beta-blockers,  *   History of PVD, and is on statins, Plavix: Continue them.   All the records are reviewed and case discussed with Care Management/Social Workerr. Management plans discussed with the patient, family and they are in agreement.  CODE STATUS: full.  TOTAL TIME TAKING CARE OF THIS PATIENT: 35 minutes.    POSSIBLE D/C IN 1-2 DAYS, DEPENDING ON CLINICAL CONDITION.   Vaughan Basta M.D on 05/22/2018   Between 7am to 6pm - Pager - [phone]  After 6pm go to [email or web address] - password [email or web address]  Office  [phone]  CC: Primary care physician; Glean Hess, MD  Note: This dictation was prepared with Dragon dictation along with smaller phrase technology. Any transcriptional errors that result from this process are unintentional.

## 2018-05-22 NOTE — Progress Notes (Addendum)
Cardiovascular and Pulmonary Nurse Navigator Note:   84 year old female patient with essential hypertension, abdominal aortic aneurysm comes in because of 3weeks progressive shortness of breath with exertion, pedal edema, patient had a fall a week ago and hit her head and had persistent headache after that. Patient has elevated BNP at 1055.  Patient admitted with Acute chronic respiratory failure with hypoxia secondary to ac diastolic CHF exacerbation and atypical pneumonia.  Patient has been receiving IV lasix and oxygen.  Patient on zithromax for pneumonia.     Rounded on patient.  Patient lying in bed with oxygen via nasal cannula in use.  Patient's son at bedside. Patient lives with son.   Patient gave this nurse permission to speak in front of patient's son about her health / medical condition.    CHF Education:?? Educational session with patient completed.  Provided patient with "Living Better with Heart Failure" packet. Briefly reviewed definition of heart failure and signs and symptoms of an exacerbation.?Explained to patient that HF is a chronic illness which requires self-assessment / self-management along with help from the cardiologist/PCP.?? ? *Reviewed importance of and reason behind checking weight daily in the AM, after using the bathroom, but before getting dressed. Patient has scales. Son confirmed.   ? *Reviewed with patient the following information: *Discussed when to call the Dr= weight gain of >2-3lb overnight of 5lb in a week,  *Discussed yellow zone= call MD: weight gain of >2-3lb overnight of 5lb in a week, increased swelling, increased SOB when lying down, chest discomfort, dizziness, increased fatigue *Red Zone= call 911: struggle to breath, fainting or near fainting, significant chest pain   *Reviewed low sodium diet-provided handout of recommended and not recommended foods.?Patient's BMI is 14.45.  Dietitian has seen patient to discuss her nutrition (Moder  malnutrition in setting of chronic illness - underweight).  Patient informed this RN that she loves salt.  Discussed with patient and son about using other seasonings to season food, as salt / sodium makes our bodies retain fluid.   ? *Discussed fluid intake with patient as well. Patient not currently on a fluid restriction, but advised no more than 8-8 ounces glass of fluids per day.?Note:  Patient stated she has been drinking a lot of liquids.   ? *Instructed patient to take medications as prescribed for heart failure. Explained briefly why pt is on the medications (either make you feel better, live longer or keep you out of the hospital) and discussed monitoring and side effects.  ? *Discussed activity.  Physical Therapy is working with patient.  Patient de-sats with slightest exertion.   ? *Smoking Cessation- Patient is a current everyday smoker.  Patient did not reveal how much she smokes per day to this RN.  She stated, "It varies."  Reviewed with patient and son the handout, "Thinking About Quitting Tobacco - Yes, You Can!" ? *[facility] - Explained the purpose of the HF Clinic. ?Explained to patient the HF Clinic does not replace PCP nor Cardiologist, but is an additional resource to helping patient manage heart failure at home. NOTE:  Patient stated she does not have a cardiologist.  Patient and son are agreeable to an appointment in the [facility].  Appointment scheduled for 05/29/2018 at 1:20 p.m.     Again, the 5 Steps to Living Better with Heart Failure were reviewed with patient.  ? Patient / son thanked me for providing the above information. ? ? Roanna Epley, RN, BSN, [facility]? [place]  [facility] Cardiac [facility]?Pulmonary Re[facility]  Cardiovascular &?Pulmonary Nurse Navigator  Direct Line: [phone]  Department Phone #: [phone] Fax: [phone]? Email Address: [email or web address]

## 2018-05-23 LAB — BASIC METABOLIC PANEL
Anion gap: 9 (ref 5–15)
BUN: 29 mg/dL — AB (ref 8–23)
CHLORIDE: 99 mmol/L (ref 98–111)
CO2: 32 mmol/L (ref 22–32)
CREATININE: 1.03 mg/dL — AB (ref 0.44–1.00)
Calcium: 8.5 mg/dL — ABNORMAL LOW (ref 8.9–10.3)
GFR calc Af Amer: 56 mL/min — ABNORMAL LOW (ref >60)
GFR calc non Af Amer: 48 mL/min — ABNORMAL LOW (ref >60)
Glucose, Bld: 102 mg/dL — ABNORMAL HIGH (ref 70–99)
POTASSIUM: 4.3 mmol/L (ref 3.5–5.1)
SODIUM: 140 mmol/L (ref 135–145)

## 2018-05-23 LAB — GLUCOSE, CAPILLARY: GLUCOSE-CAPILLARY: 100 mg/dL — AB (ref 70–99)

## 2018-05-23 MED ORDER — FUROSEMIDE 20 MG PO TABS: 20.0000 mg | ORAL_TABLET | Freq: Every day | ORAL | 0 refills | Status: DC

## 2018-05-23 MED ORDER — AZITHROMYCIN 250 MG PO TABS: ORAL_TABLET | ORAL | 0 refills | Status: DC

## 2018-05-23 MED ORDER — POTASSIUM CHLORIDE CRYS ER 10 MEQ PO TBCR: 20.0000 meq | EXTENDED_RELEASE_TABLET | Freq: Every day | ORAL | 0 refills | Status: DC

## 2018-05-23 NOTE — Discharge Instructions (Signed)
Shortness of Breath, Adult Shortness of breath is when a person has trouble breathing enough air, or when a person feels like she or he is having trouble breathing in enough air. Shortness of breath could be a sign of medical problem. Follow these instructions at home: Pay attention to any changes in your symptoms. Take these actions to help with your condition:  Do not smoke. Smoking is a common cause of shortness of breath. If you smoke and you need help quitting, ask your health care provider.  Avoid things that can irritate your airways, such as: ? Mold. ? Dust. ? Air pollution. ? Chemical fumes. ? Things that can cause allergy symptoms (allergens), if you have allergies.  Keep your living space clean and free of mold and dust.  Rest as needed. Slowly return to your usual activities.  Take over-the-counter and prescription medicines, including oxygen and inhaled medicines, only as told by your health care provider.  Keep all follow-up visits as told by your health care provider. This is important.  Contact a health care provider if:  Your condition does not improve as soon as expected.  You have a hard time doing your normal activities, even after you rest.  You have new symptoms. Get help right away if:  Your shortness of breath gets worse.  You have shortness of breath when you are resting.  You feel light-headed or you faint.  You have a cough that is not controlled with medicines.  You cough up blood.  You have pain with breathing.  You have pain in your chest, arms, shoulders, or abdomen.  You have a fever.  You cannot walk up stairs or exercise the way that you normally do. This information is not intended to replace advice given to you by your health care provider. Make sure you discuss any questions you have with your health care provider. Document Released: 03/26/2001 Document Revised: 01/20/2016 Document Reviewed: 12/07/2015 Elsevier Interactive Patient  Education  2018 Elsevier Inc.  

## 2018-05-23 NOTE — Discharge Summary (Signed)
[facility] [place] NAME: Tasha Reyes    MR#:  [id number]  DATE OF BIRTH:  05-Jan-1931  DATE OF ADMISSION:  05/20/2018 ADMITTING PHYSICIAN: Epifanio Lesches, MD  DATE OF DISCHARGE: No discharge date for patient encounter.  PRIMARY CARE PHYSICIAN: Glean Hess, MD    ADMISSION DIAGNOSIS:  Community acquired pneumonia of right lung, unspecified part of lung [J18.9]  DISCHARGE DIAGNOSIS:  Active Problems:   Acute on chronic respiratory failure (HCC)   Malnutrition of moderate degree   SECONDARY DIAGNOSIS:   Past Medical History:  Diagnosis Date  . Aneurysm of iliac artery (HCC) 05/11/2015  . COPD (chronic obstructive pulmonary disease) ([place])   . Cystitis   . History of DVT (deep vein thrombosis) 05/11/2015  . History of peptic ulcer disease 05/11/2015  . [name]   . Hypertension   . Losing weight   . Migraines   . [name]   . Rectal bleeding   . Stroke ([place])   . Vascular disease     HOSPITAL COURSE:    84 year old female patient with essential hypertension, abdominal aortic aneurysm comes in because of 3weeks progressive shortness of breath with exertion, pedal edema, patient had a fall a week ago and hit her head and had success persistent headache after that. Patient has elevated BNP at 1055.   * Acute on chronic respiratory failure with hypoxia  secondary to ac diastolic CHF exacerbation:  Echocardiogram noted for ejection fraction of 50 to 55%  Successfully weaned to baseline O2 requirement of 2 L via nasal cannula   *Acute diastolic congestive heart failure exacerbation Echocardiogram noted above Treated on our congestive heart failure protocol with IV Lasix, will follow cardiology status post discharge  * Atypical pneumonia Resolving Treated with empiric azithromycin while in house  * History of abdominal aortic aneurysm without rupture Patient follows  up with Dr. Lucky Cowboy. Saw him recently on October 4. Follow-up primary care provider status post discharge for continued care  * depression Stable on Remeron  *COPD without exacerbation Breathing treatments as needed  *Chronic essential hypertension continue beta-blocker  *History of PVD Continue statin therapy and Plavix DISCHARGE CONDITIONS:   stable  CONSULTS OBTAINED:    DRUG ALLERGIES:  No Known Allergies  DISCHARGE MEDICATIONS:   Allergies as of 05/23/2018   No Known Allergies     Medication List    TAKE these medications   albuterol (2.5 MG/3ML) 0.083% nebulizer solution Commonly known as:  PROVENTIL USE 1 VIAL VIA NEBULIZER EVERY 4 HOURS AS NEEDED FOR WHEEZING OR SHORTNESS OF BREATH What changed:  See the new instructions.   atenolol 25 MG tablet Commonly known as:  TENORMIN TAKE 1 TABLET(25 MG) BY MOUTH TWICE DAILY What changed:  See the new instructions.   atorvastatin 10 MG tablet Commonly known as:  LIPITOR TAKE 1 TABLET BY MOUTH EVERY NIGHT AT BEDTIME   azithromycin 250 MG tablet Commonly known as:  ZITHROMAX As directed Start taking on:  05/24/2018   clopidogrel 75 MG tablet Commonly known as:  PLAVIX TAKE 1 TABLET BY MOUTH DAILY What changed:  how much to take   FEROSUL 325 (65 FE) MG tablet Generic drug:  ferrous sulfate TAKE 1 TABLET(325 MG) BY MOUTH DAILY What changed:  See the new instructions.   furosemide 20 MG tablet Commonly known as:  LASIX Take 1 tablet (20 mg total) by mouth daily.   gabapentin 300 MG capsule Commonly known as:  NEURONTIN Take 1 capsule (300 mg total) by mouth at bedtime.   HYDROcodone-acetaminophen 5-325 MG tablet Commonly known as:  NORCO/VICODIN Take 1 tablet by mouth every 8 (eight) hours as needed for severe pain. What changed:  Another medication with the same name was removed. Continue taking this medication, and follow the directions you see here.   methocarbamol 750 MG tablet Commonly known as:   ROBAXIN Take 1 tablet (750 mg total) by mouth 3 (three) times daily as needed for muscle spasms.   mirtazapine 15 MG tablet Commonly known as:  REMERON Take 15 mg by mouth at bedtime.   OXYGEN Inhale 2 L/min into the lungs. Use via nasal cannula at night and during the day as needed   potassium chloride 10 MEQ tablet Commonly known as:  K-DUR,KLOR-CON Take 2 tablets (20 mEq total) by mouth daily.   predniSONE 5 MG tablet Commonly known as:  DELTASONE TAKE 1 TABLET(5 MG) BY MOUTH DAILY WITH BREAKFAST   Vitamin D3 50 MCG (2000 UT) capsule Take 1 capsule (2,000 Units total) by mouth daily.        DISCHARGE INSTRUCTIONS:  If you experience worsening of your admission symptoms, develop shortness of breath, life threatening emergency, suicidal or homicidal thoughts you must seek medical attention immediately by calling 911 or calling your MD immediately  if symptoms less severe.  You Must read complete instructions/literature along with all the possible adverse reactions/side effects for all the Medicines you take and that have been prescribed to you. Take any new Medicines after you have completely understood and accept all the possible adverse reactions/side effects.   Please note  You were cared for by a hospitalist during your hospital stay. If you have any questions about your discharge medications or the care you received while you were in the hospital after you are discharged, you can call the unit and asked to speak with the hospitalist on call if the hospitalist that took care of you is not available. Once you are discharged, your primary care physician will handle any further medical issues. Please note that NO REFILLS for any discharge medications will be authorized once you are discharged, as it is imperative that you return to your primary care physician (or establish a relationship with a primary care physician if you do not have one) for your aftercare needs so that they can  reassess your need for medications and monitor your lab values.    Today   CHIEF COMPLAINT:   Chief Complaint  Patient presents with  . Shortness of Breath  . Leg Swelling    HISTORY OF PRESENT ILLNESS:  84 y.o. female with a known history of essential hypertension, abdominal aortic aneurysm who was follows up with Dr. Lucky Cowboy comes in with shortness of breath, pedal edema, orthopnea.  Patient went to urgent care today sent here because of pedal edema, hypoxia, patient O2 sat percent on room air and patient was placed on 6 L and sent to emergency room.  Patient has been having shortness of breath, pedal edema for about 3 weeks associated with cough and clear phlegm.  Patient received Lasix 40 mg IV 1 time in the emergency room.  Sats improved, now on 2 L and sats are 95%.  Patient son at bedside, patient is not able to sleep when she is sitting at night because of shortness of breath.  COPD, on 2 L of oxygen at night and daytime also recently. VITAL SIGNS:  Blood pressure 138/71, pulse 62,  temperature 97.9 F (36.6 C), temperature source Oral, resp. rate 18, height 5\' 6"  (1.676 m), weight 40.9 kg, SpO2 100 %.  I/O:    Intake/Output Summary (Last 24 hours) at 05/23/2018 1126 Last data filed at 05/23/2018 1025 Gross per 24 hour  Intake 960 ml  Output 1140 ml  Net -180 ml    PHYSICAL EXAMINATION:  GENERAL:  84 y.o.-year-old patient lying in the bed with no acute distress.  EYES: Pupils equal, round, reactive to light and accommodation. No scleral icterus. Extraocular muscles intact.  HEENT: Head atraumatic, normocephalic. Oropharynx and nasopharynx clear.  NECK:  Supple, no jugular venous distention. No thyroid enlargement, no tenderness.  LUNGS: Normal breath sounds bilaterally, no wheezing, rales,rhonchi or crepitation. No use of accessory muscles of respiration.  CARDIOVASCULAR: S1, S2 normal. No murmurs, rubs, or gallops.  ABDOMEN: Soft, non-tender, non-distended. Bowel sounds  present. No organomegaly or mass.  EXTREMITIES: No pedal edema, cyanosis, or clubbing.  NEUROLOGIC: Cranial nerves II through XII are intact. Muscle strength 5/5 in all extremities. Sensation intact. Gait not checked.  PSYCHIATRIC: The patient is alert and oriented x 3.  SKIN: No obvious rash, lesion, or ulcer.   DATA REVIEW:   CBC Recent Labs  Lab 05/21/18 0445  WBC 8.5  HGB 13.0  HCT 42.1  PLT 518*    Chemistries  Recent Labs  Lab 05/20/18 1700  05/23/18 0538  NA 140   < > 140  K 3.8   < > 4.3  CL 100   < > 99  CO2 32   < > 32  GLUCOSE 130*   < > 102*  BUN 18   < > 29*  CREATININE 0.85   < > 1.03*  CALCIUM 9.0   < > 8.5*  AST 24  --   --   ALT 19  --   --   ALKPHOS 64  --   --   BILITOT 0.7  --   --    < > = values in this interval not displayed.    Cardiac Enzymes Recent Labs  Lab 05/20/18 1700  TROPONINI <0.03    Microbiology Results  Results for orders placed or performed during the hospital encounter of 05/20/18  Blood culture (routine x 2)     Status: None (Preliminary result)   Collection Time: 05/20/18  6:32 PM  Result Value Ref Range Status   Specimen Description BLOOD RIGHT ANTECUBITAL  Final   Special Requests   Final    BOTTLES DRAWN AEROBIC AND ANAEROBIC Blood Culture results may not be optimal due to an excessive volume of blood received in culture bottles   Culture   Final    NO GROWTH 3 DAYS Performed at [facility], [address], [place], [place] [address]    Report Status PENDING  Incomplete  Blood culture (routine x 2)     Status: None (Preliminary result)   Collection Time: 05/20/18  7:02 PM  Result Value Ref Range Status   Specimen Description BLOOD BLOOD LEFT HAND  Final   Special Requests   Final    BOTTLES DRAWN AEROBIC AND ANAEROBIC Blood Culture adequate volume   Culture   Final    NO GROWTH 3 DAYS Performed at [facility], [address], [place], [place] [address]    Report Status PENDING   Incomplete    RADIOLOGY:  No results found.  EKG:   Orders placed or performed during the hospital encounter of 05/20/18  .  ED EKG  . ED EKG  . EKG 12-Lead  . EKG 12-Lead      Management plans discussed with the patient, family and they are in agreement.  CODE STATUS:     Code Status Orders  (From admission, onward)         Start     Ordered   05/20/18 1922  Full code  Continuous     05/20/18 1927        Code Status History    Date Active Date Inactive Code Status Order ID Comments User Context   09/06/2017 2256 09/08/2017 1822 Full Code [id number]  Henreitta Leber, MD Inpatient   06/16/2017 0813 06/21/2017 1232 Full Code [id number]  Harrie Foreman, MD Inpatient   08/19/2016 2014 08/21/2016 1834 Full Code [id number]  Nicholes Mango, MD Inpatient    Advance Directive Documentation     Most Recent Value  Type of Advance Directive  Healthcare Power of [place], Living will  Pre-existing out of facility DNR order (yellow form or pink MOST form)  -  "MOST" Form in Place?  -      TOTAL TIME TAKING CARE OF THIS PATIENT: 40 minutes.    Avel Peace Zack Crager M.D on 05/23/2018 at 11:26 AM  Between 7am to 6pm - Pager - [phone]  After 6pm go to [email or web address] - password [email or web address]  Office  [phone]  CC: Primary care physician; Glean Hess, MD   Note: This dictation was prepared with Dragon dictation along with smaller phrase technology. Any transcriptional errors that result from this process are unintentional.

## 2018-05-23 NOTE — Care Management Note (Addendum)
Case Management Note  Patient Details  Name: Tasha Reyes MRN: [id number] Date of Birth: [birth year]  Subjective/Objective:   Patient to be discharged per MD order. Orders in place for home health services. Patient agreeable to home health and prefers to use [facility] as [facility] cannot service the patients address. Referral placed with Tasha Reyes from [place].  No DME needs. Family to transport. Tasha Pew [name] RN BSN RNCM [phone]  *update- Tasha Reyes can also not take patient because of location. Tasha Reyes from [place] will take patient for home health.                  Action/Plan:   Expected Discharge Date:  05/23/18               Expected Discharge Plan:  [place]  In-House Referral:     Discharge planning Services  CM Consult  Post Acute Care Choice:  Home Health Choice offered to:  Patient  DME Arranged:    DME Agency:     HH Arranged:  RN, PT, Nurse's Aide Tasha Reyes Agency:  [place]  Status of Service:  Completed, signed off  If discussed at [place] of Stay Meetings, dates discussed:    Additional Comments:  Tasha Drown [name], RN 05/23/2018, 1:08 PM

## 2018-05-23 NOTE — Plan of Care (Signed)
  Problem: Health Behavior/Discharge Planning: Goal: Ability to manage health-related needs will improve Outcome: Progressing   Problem: Activity: Goal: Ability to tolerate increased activity will improve Outcome: Progressing   Problem: Respiratory: Goal: Ability to maintain adequate ventilation will improve Outcome: Progressing   Problem: Health Behavior/Discharge Planning: Goal: Ability to manage health-related needs will improve Outcome: Progressing   Problem: Activity: Goal: Ability to tolerate increased activity will improve Outcome: Progressing   Problem: Respiratory: Goal: Ability to maintain adequate ventilation will improve Outcome: Progressing

## 2018-05-25 ENCOUNTER — Telehealth: Payer: Self-pay

## 2018-05-25 LAB — CULTURE, BLOOD (ROUTINE X 2)
Culture: NO GROWTH
Culture: NO GROWTH
Special Requests: ADEQUATE

## 2018-05-25 NOTE — Telephone Encounter (Signed)
Transition Care Management Follow-up Telephone Call  Date of discharge and from where:   How have you been since you were released from the hospital? Doing okay, feels weak  Any questions or concerns? No    Items Reviewed:  Did the pt receive and understand the discharge instructions provided? Yes   Medications obtained and verified? Yes   Any new allergies since your discharge? No   Dietary orders reviewed? Yes  Do you have support at home? Yes  pt's son lives with her and assists with ADL's  Functional Questionnaire: (I = Independent and D = Dependent) ADLs: D  Bathing/Dressing- D  Meal Prep- D  Eating- I  Maintaining continence- I  Transferring/Ambulation- I/D  Managing Meds- I/D  Follow up appointments reviewed:   PCP Hospital f/u appt confirmed? Yes  Scheduled to see Dr. Army Melia on 05/29/18 @ 10:40.  [facility]u [facility]t confirmed? Yes  Scheduled to see Darylene Price - new pt appt for [facility] on 05/29/18 @ 1:20..  Are transportation arrangements needed? No   If their condition worsens, is the pt aware to call PCP or go to the Emergency Dept.? Yes  Was the patient provided with contact information for the PCP's office or ED? Yes  Was to pt encouraged to call back with questions or concerns? Yes  Pt appreciative of call, reluctant to discuss anything in detail due to feeling weak. Confirmed new medications were picked up and started. Also provided information regarding new pt appt at [facility] HF clinic.

## 2018-05-26 ENCOUNTER — Telehealth: Payer: Self-pay

## 2018-05-26 ENCOUNTER — Other Ambulatory Visit: Payer: Self-pay | Admitting: *Deleted

## 2018-05-26 NOTE — Telephone Encounter (Signed)
Theodosia Blender from [facility] called to inform us that patient's family refused services for home health.   FYI.  CB# [phone]

## 2018-05-26 NOTE — Patient Outreach (Addendum)
[facility]) Care Management  05/26/2018  Tasha Reyes 04/30/1931 [id number]   EMMI-general discharge RED ON EMMI ALERT Day # 1  Date: 05/25/18 Monday 1033 Red Alert Reason: Read discharge papers? No  Know who to call about changes in condition? No   Outreach attempt # 1  Patient is able to verify [facility] Care Management RN reviewed and addressed red alert with patient EMMI  Tasha Reyes reports she has her discharge papers but has not completely finished reading them.   CM reviewed her d/c instructions with her and discussed who to call about changes in her condition to include primary MD, [facility] RN CM, or 24 hour RN line.. Informed her Cm would send a successful outreach letter to her when she informed CM she was not able to write the contact numbers at this time.  "I'm sitting here in the dark"  When inquired abut concerns since discharge Tasha Reyes reports she has lost weight and she states she is now "eighty nine pounds"   Tasha Reyes reports she is not able to walk anymore, is a little depressed about that and has home health ordered but had not seen staff at this time  CM reviewed EPIC to see she was ordered  -HH Arranged:  RN, PT, Nurse's Aide Cm discussed Cm to check on start of services for her   CM discussed [facility] SW for her concerns with walking and little of interest/depression She is on Remeron  Tasha Brinker confirms she was hospitalized for CHF but does not have a scale   Social: Tasha Reyes states her Son lives with her and provides transportation  She is needing assist with all care   Conditions: HF, PVD, HTN, AAA, COPD, opioid induced constipation, rectal bleeding, DDD,  osteoporosis, restless leg, intractable chronic tension type headache, right severe L4-5 lumbar foraminal stenosis, neurogenic pain , chronic pain syndrome, malnutrition of moderate degree, hyperlipidemia   Medications: denies concerns with taking medications as  prescribed, affording medications, side effects of medications and questions about medications  Appointments: Tasha Reyes reports she is to see her primary MD on Friday May 29 2018   Her d/c instruction states for her to follow up with a cardiologist but she reports she does not have one  Advance Directives: has living will and POA   Consent: [facility] RN CM reviewed [facility] services with patient. Patient gave verbal consent for services.   Advised patient that there will be further automated EMMI- post discharge calls to assess how the patient is doing following the recent hospitalization Advised the patient that another call may be received from a nurse if any of their responses were abnormal. Patient voiced understanding and was appreciative of f/u call.   Plan: [facility] RN CM will follow up with Tasha Reyes within 4-7 business days [facility])  [facility] RN CM will refer to [facility] SW for depression. Little interest in things, not able to walk   [facility] RN CM to speak with Tasha Reyes again to check on need for [place] RN CM referral (need scale, Cardiologist )    [name] L. Lavina Hamman, RN, BSN, [place] Coordinator Office number [phone] Mobile number [phone]  Main [facility] number [phone] Fax number [phone]

## 2018-05-27 ENCOUNTER — Other Ambulatory Visit: Payer: Self-pay | Admitting: *Deleted

## 2018-05-27 DIAGNOSIS — I714 Abdominal aortic aneurysm, without rupture: Secondary | ICD-10-CM | POA: Diagnosis not present

## 2018-05-27 DIAGNOSIS — G2581 Restless legs syndrome: Secondary | ICD-10-CM | POA: Diagnosis not present

## 2018-05-27 DIAGNOSIS — F17210 Nicotine dependence, cigarettes, uncomplicated: Secondary | ICD-10-CM | POA: Diagnosis not present

## 2018-05-27 DIAGNOSIS — J9611 Chronic respiratory failure with hypoxia: Secondary | ICD-10-CM | POA: Diagnosis not present

## 2018-05-27 DIAGNOSIS — I10 Essential (primary) hypertension: Secondary | ICD-10-CM | POA: Diagnosis not present

## 2018-05-27 DIAGNOSIS — I739 Peripheral vascular disease, unspecified: Secondary | ICD-10-CM | POA: Diagnosis not present

## 2018-05-27 DIAGNOSIS — J189 Pneumonia, unspecified organism: Secondary | ICD-10-CM | POA: Diagnosis not present

## 2018-05-27 DIAGNOSIS — M48061 Spinal stenosis, lumbar region without neurogenic claudication: Secondary | ICD-10-CM | POA: Diagnosis not present

## 2018-05-27 DIAGNOSIS — E559 Vitamin D deficiency, unspecified: Secondary | ICD-10-CM | POA: Diagnosis not present

## 2018-05-27 DIAGNOSIS — G894 Chronic pain syndrome: Secondary | ICD-10-CM | POA: Diagnosis not present

## 2018-05-27 DIAGNOSIS — G629 Polyneuropathy, unspecified: Secondary | ICD-10-CM | POA: Diagnosis not present

## 2018-05-27 DIAGNOSIS — E440 Moderate protein-calorie malnutrition: Secondary | ICD-10-CM | POA: Diagnosis not present

## 2018-05-27 DIAGNOSIS — E785 Hyperlipidemia, unspecified: Secondary | ICD-10-CM | POA: Diagnosis not present

## 2018-05-27 DIAGNOSIS — M5417 Radiculopathy, lumbosacral region: Secondary | ICD-10-CM | POA: Diagnosis not present

## 2018-05-27 DIAGNOSIS — Z8673 Personal history of transient ischemic attack (TIA), and cerebral infarction without residual deficits: Secondary | ICD-10-CM | POA: Diagnosis not present

## 2018-05-27 DIAGNOSIS — J440 Chronic obstructive pulmonary disease with acute lower respiratory infection: Secondary | ICD-10-CM | POA: Diagnosis not present

## 2018-05-27 DIAGNOSIS — F329 Major depressive disorder, single episode, unspecified: Secondary | ICD-10-CM | POA: Diagnosis not present

## 2018-05-27 DIAGNOSIS — M5136 Other intervertebral disc degeneration, lumbar region: Secondary | ICD-10-CM | POA: Diagnosis not present

## 2018-05-27 DIAGNOSIS — Z9981 Dependence on supplemental oxygen: Secondary | ICD-10-CM | POA: Diagnosis not present

## 2018-05-27 NOTE — Patient Outreach (Signed)
Tasha Reyes [facility]) Care Management  05/27/2018  Tasha Reyes 02-Aug-1930 [id number]   Care coordination  [facility] RN CM called speak with staff of [facility], well care and amedysis  [facility] was the last agency to receive referral for home health when pt left [facility]  Mliss Sax was able to find the start of services was on 05/26/18 but there is a note stating that services was refused on 05/26/18   Plan [facility] RN CM will follow up with [name] within 4-7 business days [facility])  [name] L. Lavina Hamman, RN, BSN, [place] Coordinator Office number [phone] Mobile number [phone]  Main [facility] number [phone] Fax number [phone]

## 2018-05-28 ENCOUNTER — Other Ambulatory Visit: Payer: Self-pay | Admitting: *Deleted

## 2018-05-28 NOTE — Patient Outreach (Signed)
[place] [facility]) Care Management  05/28/2018  EMMELINA MCLOUGHLIN Aug 23, 1930 [id number]   Care coordination  [facility] RN CM called to follow up with Mrs Henigan about home health and need for [facility] services  Patient is able to verify HIPAA Reviewed and addressed EMMI reasons for the call with patient Consent: [facility] RN CM reviewed [facility] services with patient. [facility] RN CM confirmed that Mrs Swiech still wanted service for [facility] but her son had informed [facility] that she had not wanted services related previous poor experiences with other home visits. Mrs Paff confirms she has been seen and will be seen once a week by [place]   She gave permission for CM to speak with stephen, primary caregiver and son, during this call  CM gave stephen CM's contact number for a return call if any further [facility] services are found to be needed. Annie Main feels Mrs Cutsforth may possibly later need an aide. CM discussed how to contact the MD to get this added through [employer] said they had bad experience with 2 other home visiting staff in past one came out and "only wanted to count  Pills" and other "wanted money for at bad habit", therefore stephen had informed home health that services was not needed. 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 to take Mrs Foglio to on 05/28/18  The son voiced frustration with primary MD related to not being evaluated for concerns with Mrs Aderman's feet about a month ago  He reports this may have prevented her admission Annie Main given [facility] RN CM's number and encourage to review [facility] letter with [facility] brochure to call back if services needed CM had reviewed and offered [facility] community RN with Annie Main but he refused at this time    Plan [facility] RN CM will close case at this time as patient has been assessed and no needs identified.   Pt encouraged to  return a call to [facility] RN CM prn  Iker Nuttall L. Lavina Hamman, RN, BSN, [place] Coordinator Office number [phone] Mobile number [phone]  Main [facility] number [phone] Fax number [phone]

## 2018-05-29 ENCOUNTER — Ambulatory Visit (INDEPENDENT_AMBULATORY_CARE_PROVIDER_SITE_OTHER): Payer: Medicare Other | Admitting: Internal Medicine

## 2018-05-29 ENCOUNTER — Ambulatory Visit: Payer: Medicare Other | Attending: Family | Admitting: Family

## 2018-05-29 ENCOUNTER — Encounter: Payer: Self-pay | Admitting: Internal Medicine

## 2018-05-29 ENCOUNTER — Encounter: Payer: Self-pay | Admitting: Family

## 2018-05-29 VITALS — BP 98/58 | HR 72 | Ht 66.0 in | Wt 82.0 lb

## 2018-05-29 VITALS — BP 114/69 | HR 88 | Resp 18 | Ht 66.0 in | Wt 92.5 lb

## 2018-05-29 DIAGNOSIS — Z8673 Personal history of transient ischemic attack (TIA), and cerebral infarction without residual deficits: Secondary | ICD-10-CM | POA: Insufficient documentation

## 2018-05-29 DIAGNOSIS — I5032 Chronic diastolic (congestive) heart failure: Secondary | ICD-10-CM | POA: Diagnosis not present

## 2018-05-29 DIAGNOSIS — F17210 Nicotine dependence, cigarettes, uncomplicated: Secondary | ICD-10-CM | POA: Diagnosis not present

## 2018-05-29 DIAGNOSIS — Z9981 Dependence on supplemental oxygen: Secondary | ICD-10-CM | POA: Diagnosis not present

## 2018-05-29 DIAGNOSIS — Z23 Encounter for immunization: Secondary | ICD-10-CM | POA: Diagnosis not present

## 2018-05-29 DIAGNOSIS — Z90711 Acquired absence of uterus with remaining cervical stump: Secondary | ICD-10-CM | POA: Insufficient documentation

## 2018-05-29 DIAGNOSIS — J449 Chronic obstructive pulmonary disease, unspecified: Secondary | ICD-10-CM | POA: Insufficient documentation

## 2018-05-29 DIAGNOSIS — Z9889 Other specified postprocedural states: Secondary | ICD-10-CM | POA: Insufficient documentation

## 2018-05-29 DIAGNOSIS — I10 Essential (primary) hypertension: Secondary | ICD-10-CM

## 2018-05-29 DIAGNOSIS — Z7952 Long term (current) use of systemic steroids: Secondary | ICD-10-CM | POA: Insufficient documentation

## 2018-05-29 DIAGNOSIS — Z79899 Other long term (current) drug therapy: Secondary | ICD-10-CM | POA: Insufficient documentation

## 2018-05-29 DIAGNOSIS — J181 Lobar pneumonia, unspecified organism: Secondary | ICD-10-CM

## 2018-05-29 DIAGNOSIS — I110 Hypertensive heart disease with heart failure: Secondary | ICD-10-CM | POA: Diagnosis not present

## 2018-05-29 DIAGNOSIS — R0602 Shortness of breath: Secondary | ICD-10-CM | POA: Diagnosis present

## 2018-05-29 DIAGNOSIS — E7800 Pure hypercholesterolemia, unspecified: Secondary | ICD-10-CM | POA: Diagnosis not present

## 2018-05-29 DIAGNOSIS — J189 Pneumonia, unspecified organism: Secondary | ICD-10-CM

## 2018-05-29 NOTE — Progress Notes (Signed)
Patient ID: Tasha Reyes, female    DOB: June 07, 1931, 84 y.o.   MRN: [id number]  HPI  Ms Tasha Reyes is a 84 y/o female with a history of hyperlipidemia, HTN, stroke, PUD, COPD, DVT, current tobacco use and chronic heart failure.   Echo report from 05/21/18 reviewed and showed an EF of 50-55% along with mild MR.   Admitted 05/20/18 due to acute HF. IV lasix initially given and then transitioned to oral diuretics. Treated with antibiotics due to pneumonia. Discharged after 3 days.   She presents today for her initial visit with a chief complaint of moderate shortness of breath upon minimal exertion. She describes this as chronic in nature having been present for several years. She has associated fatigue and light-headedness along with this. She denies any difficulty sleeping, abdominal distention, palpitations, pedal edema, chest pain or cough. Hasn't been weighing herself daily.   Past Medical History:  Diagnosis Date  . Acute on chronic respiratory failure ([place]) 05/20/2018  . Aneurysm of iliac artery (H[facility]) 05/11/2015  . CHF (congestive heart failure) ([place])   . COPD (chronic obstructive pulmonary disease) ([place])   . Cystitis   . History of DVT (deep vein thrombosis) 05/11/2015  . History of peptic ulcer disease 05/11/2015  . [name]   . Hypertension   . Losing weight   . Migraines   . [name]   . Rectal bleeding   . Stroke ([place])   . Vascular disease    Past Surgical History:  Procedure Laterality Date  . ABDOMINAL AORTIC ANEURYSM REPAIR W/ ENDOLUMINAL GRAFT  2008  . ABDOMINAL HYSTERECTOMY    . APPENDECTOMY    . BACK SURGERY    . blood clot      removal right leg  . COLONOSCOPY  2012  . ESOPHAGOGASTRODUODENOSCOPY  04/2013   gastric ulcers  . FEMORAL-POPLITEAL BYPASS GRAFT Right 2008  . HIP ARTHROPLASTY Right 06/17/2017   Procedure: ARTHROPLASTY BIPOLAR HIP (HEMIARTHROPLASTY);  Surgeon: Earnestine Leys, MD;  Location: [facility] ORS;  Service:  Orthopedics;  Laterality: Right;  . JOINT REPLACEMENT Right 06/19/2017   right hip FX.  Marland Kitchen PARTIAL HYSTERECTOMY     Family History  Problem Relation Age of Onset  . Brain cancer Father   . Kidney disease Mother   . Colon cancer Brother    Social History   Tobacco Use  . Smoking status: Current Every Day Smoker    Packs/day: 0.25    Years: 61.00    Pack years: 15.25    Types: Cigarettes  . Smokeless tobacco: Never Used  . Tobacco comment: 3 cigrettes a day- cut back  Substance Use Topics  . Alcohol use: No    Alcohol/week: 0.0 standard drinks   No Known Allergies Prior to Admission medications   Medication Sig Start Date End Date Taking? Authorizing Provider  albuterol (PROVENTIL) (2.5 MG/3ML) 0.083% nebulizer solution USE 1 VIAL VIA NEBULIZER EVERY 4 HOURS AS NEEDED FOR WHEEZING OR SHORTNESS OF BREATH Patient taking differently: Take 2.5 mg by nebulization every 4 (four) hours as needed.  11/26/17  Yes Glean Hess, MD  atenolol (TENORMIN) 25 MG tablet TAKE 1 TABLET(25 MG) BY MOUTH TWICE DAILY Patient taking differently: Take 25 mg by mouth 2 (two) times daily.  01/22/18  Yes Glean Hess, MD  atorvastatin (LIPITOR) 10 MG tablet TAKE 1 TABLET BY MOUTH EVERY NIGHT AT BEDTIME Patient taking differently: Take 10 mg by mouth at bedtime.  12/09/17  Yes Glean Hess, MD  Cholecalciferol (VITAMIN D3) 2000 units capsule Take 1 capsule (2,000 Units total) by mouth daily. 10/21/17  Yes Vevelyn Francois, NP  clopidogrel (PLAVIX) 75 MG tablet TAKE 1 TABLET BY MOUTH DAILY Patient taking differently: Take 75 mg by mouth daily.  03/23/18  Yes Algernon Huxley, MD  FEROSUL 325 (65 Fe) MG tablet TAKE 1 TABLET(325 MG) BY MOUTH DAILY 01/25/18  Yes Glean Hess, MD  furosemide (LASIX) 20 MG tablet Take 1 tablet (20 mg total) by mouth daily. 05/23/18  Yes Salary, Avel Peace, MD  gabapentin (NEURONTIN) 300 MG capsule Take 1 capsule (300 mg total) by mouth at bedtime. 04/22/18 10/19/18 Yes Vevelyn Francois, NP  HYDROcodone-acetaminophen (NORCO/VICODIN) 5-325 MG tablet Take 1 tablet by mouth every 8 (eight) hours as needed for moderate pain.   Yes [provider]  methocarbamol (ROBAXIN) 750 MG tablet Take 1 tablet (750 mg total) by mouth 3 (three) times daily as needed for muscle spasms. 04/22/18 07/21/18 Yes King, Diona Foley, NP  mirtazapine (REMERON) 15 MG tablet Take 15 mg by mouth at bedtime.  03/26/18  Yes [provider]  OXYGEN Inhale 2 L/min into the lungs. Use via nasal cannula at night and during the day as needed   Yes [provider]  potassium chloride SA (K-DUR,KLOR-CON) 10 MEQ tablet Take 2 tablets (20 mEq total) by mouth daily. 05/23/18  Yes Salary, Avel Peace, MD  predniSONE (DELTASONE) 5 MG tablet TAKE 1 TABLET(5 MG) BY MOUTH DAILY WITH BREAKFAST 02/26/18  Yes Glean Hess, MD    Review of Systems  Constitutional: Positive for fatigue. Negative for appetite change.  HENT: Negative for congestion, postnasal drip and sore throat.   Eyes: Negative.   Respiratory: Positive for shortness of breath (easily). Negative for cough and wheezing.   Cardiovascular: Negative for chest pain, palpitations and leg swelling.  Gastrointestinal: Negative for abdominal distention and abdominal pain.  Endocrine: Negative.   Genitourinary: Negative.   Musculoskeletal: Negative for back pain and neck pain.  Allergic/Immunologic: Negative.   Neurological: Positive for light-headedness. Negative for dizziness.  Hematological: Negative for adenopathy. Does not bruise/bleed easily.  Psychiatric/Behavioral: Negative for dysphoric mood and sleep disturbance (oxygen at 3L at bedtime and PRN during the day). The patient is not nervous/anxious.    Vitals:   05/29/18 1324  BP: 114/69  Pulse: 88  Resp: 18  SpO2: (!) 85%  Weight: 92 lb 8 oz (42 kg)   Wt Readings from Last 3 Encounters:  05/29/18 92 lb 8 oz (42 kg)  05/29/18 82 lb (37.2 kg)  05/23/18 90 lb 3.2 oz (40.9  kg)   Lab Results  Component Value Date   CREATININE 1.03 (H) 05/23/2018   CREATININE 1.06 (H) 05/22/2018   CREATININE 0.83 05/21/2018   Physical Exam  Constitutional: She is oriented to person, place, and time. She appears well-developed and well-nourished.  HENT:  Head: Normocephalic and atraumatic.  Neck: Normal range of motion. Neck supple. No JVD present.  Cardiovascular: Normal rate and regular rhythm.  Pulmonary/Chest: Effort normal. No respiratory distress. She has no rhonchi. She has no rales.  Abdominal: Soft. She exhibits no distension.  Musculoskeletal:       Right lower leg: She exhibits no tenderness and no edema.       Left lower leg: She exhibits no tenderness and no edema.  Neurological: She is alert and oriented to person, place, and time.  Skin: Skin is warm and dry.  Psychiatric: She has a  normal mood and affect. Her behavior is normal.  Nursing note and vitals reviewed.  Assessment & Plan:  1: Chronic heart failure with preserved ejection fraction- - NYHA class III - euvolemic today - not weighing daily but does have scales. Instructed to begin weighing daily and call for an overnight weight gain of 2 pounds or a weekly weight gain of >5 pounds - has been trying to reduce her sodium intake. Explained the importance of not adding salt and reading food labels so that she can keep her daily sodium intake to 2000mg  daily. Written dietary information was given to her about this - BNP 05/20/18 was 1055.0 - she reports receiving her flu vaccine for this season  2: HTN- - BP looks good today - saw PCP Army Melia) earlier today - BMP from 05/23/18 reviewed and showed sodium 140, potassium 4.3 creatinine 1.03 and GFR 48  3: COPD-  - supposed to be wearing oxygen at 2-3L but she leaves it in the car because she is embarrassed to be seen wearing it - explained the potential negative effects of low oxygen saturations and encouraged her to wear it when she is supposed  to - smoking ~ 6-8 cigarettes daily and expresses no desire to quit  Patient did not bring her medications nor a list. Each medication was verbally reviewed with the patient and she was encouraged to bring the bottles to every visit to confirm accuracy of list.  Return in 2 months or sooner for any questions/problems before then.

## 2018-05-29 NOTE — Progress Notes (Signed)
Date:  05/29/2018   Name:  Tasha Reyes   DOB:  1931/05/31   MRN:  [id number]   Chief Complaint: Hospitalization Follow-up (Follow up pneumonia. Patient did not complete antibiotics per Home Nurse. Patient stated she feels weak since hospital stay. ) Pt admitted to [facility] with pneumonia from 05/20/18 to 05/23/18.  She received a TOC call on 05/25/18. She was discharged on zpak which she took for 4 day. She states that diarrhea initially but has now resolved. She is not wearing O2 now - says it in the car.  She does use it at home. She was also found to have LE edema treated with lasix.  She will see Cardiology today.  ECHO had EF 55% with mild valvular abnormalities and LVH.  CXR on admission: IMPRESSION: 1. Small focal infiltrate in the right lower lobe posteriorly. 2. Small right pleural effusion. 3.  Aortic Atherosclerosis (ICD10-I70.0).  ECHO: - Left ventricle: The cavity size was normal. Wall thickness was   increased in a pattern of mild LVH. Systolic function was normal.   The estimated ejection fraction was in the range of 50% to 55%. - Mitral valve: There was mild regurgitation. - Left atrium: The atrium was mildly dilated. - Right ventricle: The cavity size was mildly dilated. - Right atrium: The atrium was mildly dilated.  HPI  Review of Systems  Constitutional: Positive for fatigue. Negative for chills and fever.  Respiratory: Positive for shortness of breath. Negative for cough, chest tightness and wheezing.   Cardiovascular: Negative for chest pain.  Gastrointestinal: Negative for abdominal pain, constipation, diarrhea and nausea.  Genitourinary: Negative for difficulty urinating.  Musculoskeletal: Positive for arthralgias and gait problem.  Neurological: Negative for dizziness, light-headedness and headaches.  Psychiatric/Behavioral: Negative for dysphoric mood and sleep disturbance.    Patient Active Problem List   Diagnosis Date Noted  . Malnutrition of  moderate degree 05/22/2018  . Acute on chronic respiratory failure ([place]) 05/20/2018  . Chronic pain syndrome 03/02/2018  . Chronic bilateral low back pain 02/24/2018  . Bilateral leg numbness 02/24/2018  . Accidental medication overdose 02/22/2018  . DDD (degenerative disc disease), lumbar 02/03/2018  . Chronic anticoagulation (Plavix) 02/03/2018  . Chronic lower extremity pain (Bilateral) (R>L) 01/20/2018  . Disorder of skeletal system 01/20/2018  . Chronic hip pain (Right) 10/21/2017  . Rectal bleeding 09/06/2017  . History of repair of hip fracture 06/16/2017  . AAA (abdominal aortic aneurysm) without rupture ([place]) 03/18/2017  . Edema due to malnutrition ([place]) 10/28/2016  . Dependence on supplemental oxygen 09/04/2016  . Protein-calorie malnutrition, severe 08/20/2016  . Vitamin D deficiency 01/10/2016  . Opioid-induced constipation (OIC) 11/07/2015  . Osteoporosis, post-menopausal 11/07/2015  . Complaints of weakness of lower extremities (Bilateral) 11/07/2015  . Lumbar foraminal stenosis (Right) (Severe at L4-5) 06/20/2015  . Chronic lower extremity pain (Location of Primary Source of Pain) (Right) 05/11/2015  . Lumbosacral Radiculopathy (Right) 05/11/2015  . Chronic radicular pain of lower extremity (Right) 05/11/2015  . Peripheral vascular disease ([place]) 05/11/2015  . Hx of long term use of blood thinners (Plavix) 05/11/2015  . Lower extremity pain (Right) 05/11/2015  . Neurogenic pain 05/11/2015  . History of DVT (deep vein thrombosis) 05/11/2015  . Lumbar facet syndrome (Bilateral) 05/11/2015  . Dyslipidemia 12/23/2014  . Compulsive tobacco user syndrome 12/23/2014  . History of bleeding peptic ulcer 12/23/2014  . Essential (primary) hypertension 12/23/2014  . Depression, major, single episode, complete remission ([place]) 12/23/2014  . COPD (chronic obstructive pulmonary  disease) ([place]) 12/23/2014  . Restless leg 12/23/2014  . Chronic tension-type headache, intractable  12/23/2014    No Known Allergies  Past Surgical History:  Procedure Laterality Date  . ABDOMINAL AORTIC ANEURYSM REPAIR W/ ENDOLUMINAL GRAFT  2008  . ABDOMINAL HYSTERECTOMY    . APPENDECTOMY    . BACK SURGERY    . blood clot      removal right leg  . COLONOSCOPY  2012  . ESOPHAGOGASTRODUODENOSCOPY  04/2013   gastric ulcers  . FEMORAL-POPLITEAL BYPASS GRAFT Right 2008  . HIP ARTHROPLASTY Right 06/17/2017   Procedure: ARTHROPLASTY BIPOLAR HIP (HEMIARTHROPLASTY);  Surgeon: Earnestine Leys, MD;  Location: [facility] ORS;  Service: Orthopedics;  Laterality: Right;  . JOINT REPLACEMENT Right 06/19/2017   right hip FX.  Marland Kitchen PARTIAL HYSTERECTOMY      Social History   Tobacco Use  . Smoking status: Current Every Day Smoker    Packs/day: 0.25    Years: 61.00    Pack years: 15.25    Types: Cigarettes  . Smokeless tobacco: Never Used  . Tobacco comment: 3 cigrettes a day- cut back  Substance Use Topics  . Alcohol use: No    Alcohol/week: 0.0 standard drinks  . Drug use: No     Medication list has been reviewed and updated.  Current Meds  Medication Sig  . albuterol (PROVENTIL) (2.5 MG/3ML) 0.083% nebulizer solution USE 1 VIAL VIA NEBULIZER EVERY 4 HOURS AS NEEDED FOR WHEEZING OR SHORTNESS OF BREATH (Patient taking differently: Take 2.5 mg by nebulization every 4 (four) hours as needed. )  . atenolol (TENORMIN) 25 MG tablet TAKE 1 TABLET(25 MG) BY MOUTH TWICE DAILY (Patient taking differently: Take 25 mg by mouth 2 (two) times daily. )  . atorvastatin (LIPITOR) 10 MG tablet TAKE 1 TABLET BY MOUTH EVERY NIGHT AT BEDTIME (Patient taking differently: Take 10 mg by mouth at bedtime. )  . Cholecalciferol (VITAMIN D3) 2000 units capsule Take 1 capsule (2,000 Units total) by mouth daily.  . clopidogrel (PLAVIX) 75 MG tablet TAKE 1 TABLET BY MOUTH DAILY (Patient taking differently: Take 75 mg by mouth daily. )  . FEROSUL 325 (65 Fe) MG tablet TAKE 1 TABLET(325 MG) BY MOUTH DAILY  . furosemide  (LASIX) 20 MG tablet Take 1 tablet (20 mg total) by mouth daily.  Marland Kitchen gabapentin (NEURONTIN) 300 MG capsule Take 1 capsule (300 mg total) by mouth at bedtime.  . methocarbamol (ROBAXIN) 750 MG tablet Take 1 tablet (750 mg total) by mouth 3 (three) times daily as needed for muscle spasms.  . mirtazapine (REMERON) 15 MG tablet Take 15 mg by mouth at bedtime.   . OXYGEN Inhale 2 L/min into the lungs. Use via nasal cannula at night and during the day as needed  . potassium chloride SA (K-DUR,KLOR-CON) 10 MEQ tablet Take 2 tablets (20 mEq total) by mouth daily.  . predniSONE (DELTASONE) 5 MG tablet TAKE 1 TABLET(5 MG) BY MOUTH DAILY WITH BREAKFAST    PHQ 2/9 Scores 05/29/2018 05/26/2018 04/22/2018 03/02/2018  PHQ - 2 Score 2 2 0 0  PHQ- 9 Score 5 - - -    Physical Exam  Constitutional: Vital signs are normal.  Thin appearing female  HENT:  Head: Normocephalic.  Neck: Normal range of motion and full passive range of motion without pain. Neck supple.  Cardiovascular: Normal rate, regular rhythm and normal heart sounds.  No murmur heard. Pulmonary/Chest: She has decreased breath sounds. She has no wheezes. She has rhonchi in the right lower  field.  Abdominal: Soft. Bowel sounds are normal. There is no tenderness.  Musculoskeletal: She exhibits no edema or tenderness.  Neurological: She is alert.  Psychiatric: She has a normal mood and affect. Her speech is normal.    BP (!) 98/58 (BP Location: Right Arm, Patient Position: Sitting, Cuff Size: Normal)   Pulse 72   Ht 5\' 6"  (1.676 m)   Wt 82 lb (37.2 kg)   SpO2 (!) 87%   BMI 13.24 kg/m   Assessment and Plan: 1. Community acquired pneumonia of right lower lobe of lung ([place]) Encouraged pt to wear her oxygen all the time  2. Chronic obstructive pulmonary disease, unspecified COPD type ([place]) Continue inhaled medications Daily prednisone  3. Dependence on supplemental oxygen  4. Encounter for immunization - Flu vaccine HIGH DOSE  PF   Partially dictated using Editor, commissioning. Any errors are unintentional.  Halina Maidens, MD [facility]  05/29/2018

## 2018-05-29 NOTE — Patient Instructions (Addendum)
Begin weighing daily and call for an overnight weight gain of > 2 pounds or a weekly weight gain of >5 pounds. 

## 2018-06-09 ENCOUNTER — Other Ambulatory Visit (INDEPENDENT_AMBULATORY_CARE_PROVIDER_SITE_OTHER): Payer: Medicare Other | Admitting: Internal Medicine

## 2018-06-09 DIAGNOSIS — E559 Vitamin D deficiency, unspecified: Secondary | ICD-10-CM

## 2018-06-09 DIAGNOSIS — F325 Major depressive disorder, single episode, in full remission: Secondary | ICD-10-CM

## 2018-06-09 DIAGNOSIS — J449 Chronic obstructive pulmonary disease, unspecified: Secondary | ICD-10-CM | POA: Diagnosis not present

## 2018-06-09 DIAGNOSIS — I714 Abdominal aortic aneurysm, without rupture, unspecified: Secondary | ICD-10-CM

## 2018-06-09 DIAGNOSIS — Z9981 Dependence on supplemental oxygen: Secondary | ICD-10-CM

## 2018-06-09 DIAGNOSIS — G894 Chronic pain syndrome: Secondary | ICD-10-CM

## 2018-06-09 DIAGNOSIS — M5417 Radiculopathy, lumbosacral region: Secondary | ICD-10-CM

## 2018-06-09 DIAGNOSIS — F17200 Nicotine dependence, unspecified, uncomplicated: Secondary | ICD-10-CM

## 2018-06-09 DIAGNOSIS — I5032 Chronic diastolic (congestive) heart failure: Secondary | ICD-10-CM

## 2018-06-09 DIAGNOSIS — M48061 Spinal stenosis, lumbar region without neurogenic claudication: Secondary | ICD-10-CM

## 2018-06-09 DIAGNOSIS — E43 Unspecified severe protein-calorie malnutrition: Secondary | ICD-10-CM

## 2018-06-09 DIAGNOSIS — I10 Essential (primary) hypertension: Secondary | ICD-10-CM

## 2018-06-09 DIAGNOSIS — G2581 Restless legs syndrome: Secondary | ICD-10-CM

## 2018-06-09 DIAGNOSIS — I739 Peripheral vascular disease, unspecified: Secondary | ICD-10-CM

## 2018-06-09 NOTE — Progress Notes (Signed)
Received orders from [facility]. Start of Care 05/27/18. Orders for 05/27/18 trhough 07/25/18. Orders are reviewed, signed and faxed.

## 2018-06-28 ENCOUNTER — Other Ambulatory Visit: Payer: Self-pay | Admitting: Internal Medicine

## 2018-06-28 DIAGNOSIS — I10 Essential (primary) hypertension: Secondary | ICD-10-CM

## 2018-06-30 ENCOUNTER — Ambulatory Visit
Admission: RE | Admit: 2018-06-30 | Discharge: 2018-06-30 | Disposition: A | Discharge status: Home / Self Care | Payer: Medicare Other | Attending: Internal Medicine | Admitting: Internal Medicine

## 2018-06-30 ENCOUNTER — Ambulatory Visit (INDEPENDENT_AMBULATORY_CARE_PROVIDER_SITE_OTHER): Payer: Medicare Other | Admitting: Internal Medicine

## 2018-06-30 ENCOUNTER — Encounter: Payer: Self-pay | Admitting: Internal Medicine

## 2018-06-30 ENCOUNTER — Ambulatory Visit
Admission: RE | Admit: 2018-06-30 | Discharge: 2018-06-30 | Disposition: A | Discharge status: Home / Self Care | Payer: Medicare Other | Source: Ambulatory Visit | Attending: Internal Medicine | Admitting: Internal Medicine

## 2018-06-30 VITALS — BP 124/70 | HR 76 | Ht 66.0 in | Wt 96.0 lb

## 2018-06-30 DIAGNOSIS — F324 Major depressive disorder, single episode, in partial remission: Secondary | ICD-10-CM

## 2018-06-30 DIAGNOSIS — J189 Pneumonia, unspecified organism: Secondary | ICD-10-CM

## 2018-06-30 DIAGNOSIS — I5032 Chronic diastolic (congestive) heart failure: Secondary | ICD-10-CM | POA: Diagnosis not present

## 2018-06-30 DIAGNOSIS — J181 Lobar pneumonia, unspecified organism: Secondary | ICD-10-CM | POA: Insufficient documentation

## 2018-06-30 DIAGNOSIS — R05 Cough: Secondary | ICD-10-CM | POA: Diagnosis not present

## 2018-06-30 MED ORDER — FUROSEMIDE 20 MG PO TABS: 20.0000 mg | ORAL_TABLET | Freq: Every day | ORAL | 1 refills | Status: DC

## 2018-06-30 MED ORDER — FUROSEMIDE 20 MG PO TABS: 20.0000 mg | ORAL_TABLET | Freq: Every day | ORAL | 0 refills | Status: DC

## 2018-06-30 NOTE — Patient Instructions (Signed)
Stop Hydrochlorothiazide - started at [facility] in August  Take Furosemide 20 mg one every day for fluid and blood pressure

## 2018-06-30 NOTE — Progress Notes (Signed)
Date:  06/30/2018   Name:  Tasha Reyes   DOB:  1931/03/24   MRN:  [id number]   Chief Complaint: Pneumonia (follow up with xray. States she is still coughing and not feeling any better. Needs handicap sticker form while here. )  Pneumonia  She complains of cough and shortness of breath. There is no wheezing. Primary symptoms comments: Follow up from abnormal CXR last month. The current episode started more than 1 month ago. The problem has been gradually improving. Pertinent negatives include no chest pain, fever, headaches or trouble swallowing. Her symptoms are alleviated by oral steroids and beta-agonist (Zpak).  She is still short of breath but sputum is scant and white.  She has no wheezing or fever but feels like her temp is higher than usual. She uses her oxygen at night and sometimes during the day.  She never travels with it.   She does not appear to be taking prednisone 5 mg daily.  CHF/edema - She requests a refill on HCTZ that was prescribed at her hospital discharge in August.  She only had #30 and no refills.  At her discharge from [facility] in November she was prescribed furosemide 20 mg instead.  She is not sure she is taking this.  She still has LE edema.  Depression - She had been eating better with effort and has gained back some weight.   She is taking remeron at hs.  Mood is fairly good.  Review of Systems  Constitutional: Negative for chills, fatigue, fever and unexpected weight change.  HENT: Negative for trouble swallowing.   Respiratory: Positive for cough and shortness of breath. Negative for chest tightness and wheezing.   Cardiovascular: Positive for leg swelling. Negative for chest pain and palpitations.  Skin: Negative for color change and rash.  Neurological: Negative for dizziness, light-headedness and headaches.  Psychiatric/Behavioral: Negative for dysphoric mood and sleep disturbance.    Patient Active Problem List   Diagnosis Date Noted  . Chronic  diastolic heart failure ([place]) 05/29/2018  . Malnutrition of moderate degree 05/22/2018  . Chronic pain syndrome 03/02/2018  . Chronic bilateral low back pain 02/24/2018  . Bilateral leg numbness 02/24/2018  . Accidental medication overdose 02/22/2018  . DDD (degenerative disc disease), lumbar 02/03/2018  . Chronic anticoagulation (Plavix) 02/03/2018  . Chronic lower extremity pain (Bilateral) (R>L) 01/20/2018  . Disorder of skeletal system 01/20/2018  . Chronic hip pain (Right) 10/21/2017  . Rectal bleeding 09/06/2017  . History of repair of hip fracture 06/16/2017  . AAA (abdominal aortic aneurysm) without rupture ([place]) 03/18/2017  . Edema due to malnutrition ([place]) 10/28/2016  . Dependence on supplemental oxygen 09/04/2016  . Protein-calorie malnutrition, severe 08/20/2016  . Vitamin D deficiency 01/10/2016  . Opioid-induced constipation (OIC) 11/07/2015  . Osteoporosis, post-menopausal 11/07/2015  . Complaints of weakness of lower extremities (Bilateral) 11/07/2015  . Lumbar foraminal stenosis (Right) (Severe at L4-5) 06/20/2015  . Chronic lower extremity pain (Location of Primary Source of Pain) (Right) 05/11/2015  . Lumbosacral Radiculopathy (Right) 05/11/2015  . Chronic radicular pain of lower extremity (Right) 05/11/2015  . Peripheral vascular disease ([place]) 05/11/2015  . Hx of long term use of blood thinners (Plavix) 05/11/2015  . Lower extremity pain (Right) 05/11/2015  . Neurogenic pain 05/11/2015  . History of DVT (deep vein thrombosis) 05/11/2015  . Lumbar facet syndrome (Bilateral) 05/11/2015  . Dyslipidemia 12/23/2014  . Compulsive tobacco user syndrome 12/23/2014  . History of bleeding peptic ulcer 12/23/2014  . Essential (  primary) hypertension 12/23/2014  . Depression, major, single episode, complete remission ([place]) 12/23/2014  . COPD (chronic obstructive pulmonary disease) ([place]) 12/23/2014  . Restless leg 12/23/2014  . Chronic tension-type headache, intractable  12/23/2014    No Known Allergies  Past Surgical History:  Procedure Laterality Date  . ABDOMINAL AORTIC ANEURYSM REPAIR W/ ENDOLUMINAL GRAFT  2008  . ABDOMINAL HYSTERECTOMY    . APPENDECTOMY    . BACK SURGERY    . blood clot      removal right leg  . COLONOSCOPY  2012  . ESOPHAGOGASTRODUODENOSCOPY  04/2013   gastric ulcers  . FEMORAL-POPLITEAL BYPASS GRAFT Right 2008  . HIP ARTHROPLASTY Right 06/17/2017   Procedure: ARTHROPLASTY BIPOLAR HIP (HEMIARTHROPLASTY);  Surgeon: Earnestine Leys, MD;  Location: [facility] ORS;  Service: Orthopedics;  Laterality: Right;  . JOINT REPLACEMENT Right 06/19/2017   right hip FX.  Marland Kitchen PARTIAL HYSTERECTOMY      Social History   Tobacco Use  . Smoking status: Current Every Day Smoker    Packs/day: 0.25    Years: 61.00    Pack years: 15.25    Types: Cigarettes  . Smokeless tobacco: Never Used  . Tobacco comment: 3 cigrettes a day- cut back  Substance Use Topics  . Alcohol use: No    Alcohol/week: 0.0 standard drinks  . Drug use: No     Medication list has been reviewed and updated.  Current Meds  Medication Sig  . albuterol (PROVENTIL) (2.5 MG/3ML) 0.083% nebulizer solution USE 1 VIAL VIA NEBULIZER EVERY 4 HOURS AS NEEDED FOR WHEEZING OR SHORTNESS OF BREATH (Patient taking differently: Take 2.5 mg by nebulization every 4 (four) hours as needed. )  . atenolol (TENORMIN) 25 MG tablet Take 1 tablet (25 mg total) by mouth 2 (two) times daily.  Marland Kitchen atorvastatin (LIPITOR) 10 MG tablet TAKE 1 TABLET BY MOUTH EVERY NIGHT AT BEDTIME (Patient taking differently: Take 10 mg by mouth at bedtime. )  . Cholecalciferol (VITAMIN D3) 2000 units capsule Take 1 capsule (2,000 Units total) by mouth daily.  . clopidogrel (PLAVIX) 75 MG tablet TAKE 1 TABLET BY MOUTH DAILY (Patient taking differently: Take 75 mg by mouth daily. )  . FEROSUL 325 (65 Fe) MG tablet TAKE 1 TABLET(325 MG) BY MOUTH DAILY  . furosemide (LASIX) 20 MG tablet Take 1 tablet (20 mg total) by mouth  daily.  Marland Kitchen gabapentin (NEURONTIN) 300 MG capsule Take 1 capsule (300 mg total) by mouth at bedtime.  Marland Kitchen HYDROcodone-acetaminophen (NORCO/VICODIN) 5-325 MG tablet Take 1 tablet by mouth every 8 (eight) hours as needed for moderate pain.  . methocarbamol (ROBAXIN) 750 MG tablet Take 1 tablet (750 mg total) by mouth 3 (three) times daily as needed for muscle spasms.  . mirtazapine (REMERON) 15 MG tablet Take 15 mg by mouth at bedtime.   . NON FORMULARY 3 lpm O2 via  at night and during the day PRN  . OXYGEN Inhale 2 L/min into the lungs. Use via nasal cannula at night and during the day as needed  . potassium chloride SA (K-DUR,KLOR-CON) 10 MEQ tablet Take 2 tablets (20 mEq total) by mouth daily.  . [DISCONTINUED] furosemide (LASIX) 20 MG tablet Take 1 tablet (20 mg total) by mouth daily.  . [DISCONTINUED] furosemide (LASIX) 20 MG tablet Take 1 tablet (20 mg total) by mouth daily.  . [DISCONTINUED] predniSONE (DELTASONE) 5 MG tablet TAKE 1 TABLET(5 MG) BY MOUTH DAILY WITH BREAKFAST    PHQ 2/9 Scores 05/29/2018 05/26/2018 04/22/2018 03/02/2018  PHQ -  2 Score 2 2 0 0  PHQ- 9 Score 5 - - -   Wt Readings from Last 3 Encounters:  06/30/18 96 lb (43.5 kg)  05/29/18 92 lb 8 oz (42 kg)  05/29/18 82 lb (37.2 kg)    Physical Exam Constitutional:      Appearance: Normal appearance. She is underweight.  HENT:     Mouth/Throat:     Mouth: Mucous membranes are moist.  Neck:     Musculoskeletal: Normal range of motion and neck supple.  Cardiovascular:     Rate and Rhythm: Normal rate and regular rhythm.  Pulmonary:     Effort: Pulmonary effort is normal.     Breath sounds: Decreased breath sounds present. No wheezing or rhonchi.  Skin:    General: Skin is warm and dry.  Neurological:     Mental Status: She is alert.  Psychiatric:        Mood and Affect: Affect is flat.        Speech: Speech normal.        Judgment: Judgment normal.     BP 124/70 (BP Location: Left Arm, Patient Position:  Sitting, Cuff Size: Normal)   Pulse 76   Ht 5\' 6"  (1.676 m)   Wt 96 lb (43.5 kg)   SpO2 (!) 85%   BMI 15.49 kg/m  Not wearing O2!  Assessment and Plan: 1. Community acquired pneumonia of right lower lobe of lung ([place]) CXR to verify resolution - will need CT chest if not cleared - DG Chest 2 View  2. Chronic diastolic heart failure (HCC) Pt instructed to stop HCTZ (bottle disposed of) - furosemide (LASIX) 20 MG tablet; Take 1 tablet (20 mg total) by mouth daily.  Dispense: 90 tablet; Refill: 1  3. Depression, major, single episode, in partial remission ([place]) Continue Remeron Continue healthy diet for gradual weight gain and nutrition   Partially dictated using Editor, commissioning. Any errors are unintentional.  Halina Maidens, MD [facility]  06/30/2018

## 2018-07-23 ENCOUNTER — Ambulatory Visit: Payer: Medicare Other | Admitting: Family

## 2018-07-23 ENCOUNTER — Encounter: Payer: Medicare Other | Admitting: Nurse Practitioner

## 2018-07-27 ENCOUNTER — Ambulatory Visit: Payer: Medicare Other | Attending: Nurse Practitioner | Admitting: Nurse Practitioner

## 2018-07-27 ENCOUNTER — Other Ambulatory Visit: Payer: Self-pay

## 2018-07-27 ENCOUNTER — Encounter: Payer: Self-pay | Admitting: Nurse Practitioner

## 2018-07-27 VITALS — BP 156/85 | HR 70 | Temp 97.7°F | Ht 66.0 in | Wt 94.0 lb

## 2018-07-27 DIAGNOSIS — M48061 Spinal stenosis, lumbar region without neurogenic claudication: Secondary | ICD-10-CM

## 2018-07-27 DIAGNOSIS — M792 Neuralgia and neuritis, unspecified: Secondary | ICD-10-CM

## 2018-07-27 DIAGNOSIS — M79605 Pain in left leg: Secondary | ICD-10-CM | POA: Diagnosis not present

## 2018-07-27 DIAGNOSIS — M5417 Radiculopathy, lumbosacral region: Secondary | ICD-10-CM | POA: Diagnosis not present

## 2018-07-27 DIAGNOSIS — G8929 Other chronic pain: Secondary | ICD-10-CM

## 2018-07-27 DIAGNOSIS — Z79891 Long term (current) use of opiate analgesic: Secondary | ICD-10-CM

## 2018-07-27 DIAGNOSIS — M79604 Pain in right leg: Secondary | ICD-10-CM | POA: Diagnosis not present

## 2018-07-27 DIAGNOSIS — M7918 Myalgia, other site: Secondary | ICD-10-CM

## 2018-07-27 DIAGNOSIS — G894 Chronic pain syndrome: Secondary | ICD-10-CM

## 2018-07-27 DIAGNOSIS — R110 Nausea: Secondary | ICD-10-CM

## 2018-07-27 MED ORDER — HYDROCODONE-ACETAMINOPHEN 5-325 MG PO TABS: 1.0000 | ORAL_TABLET | Freq: Three times a day (TID) | ORAL | 0 refills | Status: DC | PRN

## 2018-07-27 MED ORDER — METHOCARBAMOL 750 MG PO TABS: 750.0000 mg | ORAL_TABLET | Freq: Three times a day (TID) | ORAL | 2 refills | Status: DC | PRN

## 2018-07-27 MED ORDER — ONDANSETRON 8 MG PO TBDP: 8.0000 mg | ORAL_TABLET | Freq: Two times a day (BID) | ORAL | 0 refills | Status: DC

## 2018-07-27 NOTE — Progress Notes (Signed)
Nursing Pain Medication Assessment:  Safety precautions to be maintained throughout the outpatient stay will include: orient to surroundings, keep bed in low position, maintain call bell within reach at all times, provide assistance with transfer out of bed and ambulation.  Medication Inspection Compliance: Pill count conducted under aseptic conditions, in front of the patient. Neither the pills nor the bottle was removed from the patient's sight at any time. Once count was completed pills were immediately returned to the patient in their original bottle.  Medication: Hydrocodone/APAP Pill/Patch Count: 2 of 90 pills remain Pill/Patch Appearance: Markings consistent with prescribed medication Bottle Appearance: Standard pharmacy container. Clearly labeled. Filled Date: 30 / 12 / 2019 Last Medication intake:  Today

## 2018-07-27 NOTE — Patient Instructions (Signed)
____________________________________________________________________________________________  Medication Rules  Purpose: To inform patients, and their family members, of our rules and regulations.  Applies to: All patients receiving prescriptions (written or electronic).  Pharmacy of record: Pharmacy where electronic prescriptions will be sent. If written prescriptions are taken to a different pharmacy, please inform the nursing staff. The pharmacy listed in the electronic medical record should be the one where you would like electronic prescriptions to be sent.  Electronic prescriptions: In compliance with the [place] Strengthen Opioid Misuse Prevention (STOP) Act of 2017 (Session Law 2017-74/H243), effective July 15, 2018, all controlled substances must be electronically prescribed. Calling prescriptions to the pharmacy will cease to exist.  Prescription refills: Only during scheduled appointments. Applies to all prescriptions.  NOTE: The following applies primarily to controlled substances (Opioid* Pain Medications).   Patient's responsibilities: 1. Pain Pills: Bring all pain pills to every appointment (except for procedure appointments). 2. Pill Bottles: Bring pills in original pharmacy bottle. Always bring the newest bottle. Bring bottle, even if empty. 3. Medication refills: You are responsible for knowing and keeping track of what medications you take and those you need refilled. The day before your appointment: write a list of all prescriptions that need to be refilled. The day of the appointment: give the list to the admitting nurse. Prescriptions will be written only during appointments. If you forget a medication: it will not be "Called in", "Faxed", or "electronically sent". You will need to get another appointment to get these prescribed. No early refills. Do not call asking to have your prescription filled early. 4. Prescription Accuracy: You are responsible for  carefully inspecting your prescriptions before leaving our office. Have the discharge nurse carefully go over each prescription with you, before taking them home. Make sure that your name is accurately spelled, that your address is correct. Check the name and dose of your medication to make sure it is accurate. Check the number of pills, and the written instructions to make sure they are clear and accurate. Make sure that you are given enough medication to last until your next medication refill appointment. 5. Taking Medication: Take medication as prescribed. When it comes to controlled substances, taking less pills or less frequently than prescribed is permitted and encouraged. Never take more pills than instructed. Never take medication more frequently than prescribed.  6. Inform other Doctors: Always inform, all of your healthcare providers, of all the medications you take. 7. Pain Medication from other Providers: You are not allowed to accept any additional pain medication from any other Doctor or Healthcare provider. There are two exceptions to this rule. (see below) In the event that you require additional pain medication, you are responsible for notifying us, as stated below. 8. Medication Agreement: You are responsible for carefully reading and following our Medication Agreement. This must be signed before receiving any prescriptions from our practice. Safely store a copy of your signed Agreement. Violations to the Agreement will result in no further prescriptions. (Additional copies of our Medication Agreement are available upon request.) 9. Laws, Rules, & Regulations: All patients are expected to follow all Federal and State Laws, Statutes, Rules, & Regulations. Ignorance of the Laws does not constitute a valid excuse. The use of any illegal substances is prohibited. 10. Adopted CDC guidelines & recommendations: Target dosing levels will be at or below 60 MME/day. Use of benzodiazepines** is not  recommended.  Exceptions: There are only two exceptions to the rule of not receiving pain medications from other Healthcare Providers. 1.   Exception #1 (Emergencies): In the event of an emergency (i.e.: accident requiring emergency care), you are allowed to receive additional pain medication. However, you are responsible for: As soon as you are able, call our office [phone], at any time of the day or night, and leave a message stating your name, the date and nature of the emergency, and the name and dose of the medication prescribed. In the event that your call is answered by a member of our staff, make sure to document and save the date, time, and the name of the person that took your information.  2. Exception #2 (Planned Surgery): In the event that you are scheduled by another doctor or dentist to have any type of surgery or procedure, you are allowed (for a period no longer than 30 days), to receive additional pain medication, for the acute post-op pain. However, in this case, you are responsible for picking up a copy of our "Post-op Pain Management for Surgeons" handout, and giving it to your surgeon or dentist. This document is available at our office, and does not require an appointment to obtain it. Simply go to our office during business hours (Monday-Thursday from 8:00 AM to 4:00 PM) (Friday 8:00 AM to 12:00 Noon) or if you have a scheduled appointment with us, prior to your surgery, and ask for it by name. In addition, you will need to provide us with your name, name of your surgeon, type of surgery, and date of procedure or surgery.  *Opioid medications include: morphine, codeine, oxycodone, oxymorphone, hydrocodone, hydromorphone, meperidine, tramadol, tapentadol, buprenorphine, fentanyl, methadone. **Benzodiazepine medications include: diazepam (Valium), alprazolam (Xanax), clonazepam (Klonopine), lorazepam (Ativan), clorazepate (Tranxene), chlordiazepoxide (Librium), estazolam (Prosom),  oxazepam (Serax), temazepam (Restoril), triazolam (Halcion) (Last updated: 09/11/2017) ____________________________________________________________________________________________    

## 2018-07-27 NOTE — Progress Notes (Signed)
Patient's Name: Tasha Reyes  MRN: [id number]  Referring Provider: Glean Hess, MD  DOB: 1930/10/16  PCP: Glean Hess, MD  DOS: 07/27/2018  Note by: Vevelyn Francois NP  Service setting: Ambulatory outpatient  Specialty: Interventional Pain Management  Location: [facility] (AMB) Pain Management Facility    Patient type: Established    Primary Reason(s) for Visit: Encounter for prescription drug management. (Level of risk: moderate)  CC: Leg Pain  HPI  Tasha Reyes is a 85 y.o. year old, female patient, who comes today for a medication management evaluation. She has Dyslipidemia; Compulsive tobacco user syndrome; History of bleeding peptic ulcer; Essential (primary) hypertension; Depression, major, single episode, complete remission ([place]); COPD (chronic obstructive pulmonary disease) ([place]); Restless leg; Chronic tension-type headache, intractable; Chronic lower extremity pain (Location of Primary Source of Pain) (Right); Lumbosacral Radiculopathy (Right); Chronic radicular pain of lower extremity (Right); Peripheral vascular disease ([place]); Hx of long term use of blood thinners (Plavix); Lower extremity pain (Right); Neurogenic pain; History of DVT (deep vein thrombosis); Lumbar facet syndrome (Bilateral); Lumbar foraminal stenosis (Right) (Severe at L4-5); Opioid-induced constipation ([place]); Osteoporosis, post-menopausal; Complaints of weakness of lower extremities (Bilateral); Vitamin D deficiency; Protein-calorie malnutrition, severe; Dependence on supplemental oxygen; Edema due to malnutrition [facility]); AAA (abdominal aortic aneurysm) without rupture ([place]); History of repair of hip fracture; Rectal bleeding; Chronic hip pain (Right); Chronic lower extremity pain (Bilateral) (R>L); Disorder of skeletal system; DDD (degenerative disc disease), lumbar; Chronic anticoagulation (Plavix); Accidental medication overdose; Chronic bilateral low back pain; Bilateral leg numbness; Chronic pain syndrome;  Malnutrition of moderate degree; and Chronic diastolic heart failure (HCC) on their problem list. Her primarily concern today is the Leg Pain  Pain Assessment: Location: Left, Right Leg Radiating: pain radiaties down to foot Onset: More than a month ago Duration: Chronic pain Quality: Dull Severity: 3 /10 (subjective, self-reported pain score)  Note: Reported level is compatible with observation.                          Effect on ADL: limits my daily activites Timing: Constant Modifying factors: medications and rest BP: (!) 156/85  HR: 70  Ms. Trebilcock was last scheduled for an appointment on 04/22/2018 for medication management. During today's appointment we reviewed Tasha Reyes's chronic pain status, as well as her outpatient medication regimen.  The patient  reports no history of drug use. Her body mass index is 15.17 kg/m.  Further details on both, my assessment(s), as well as the proposed treatment plan, please see below.  Controlled Substance Pharmacotherapy Assessment REMS (Risk Evaluation and Mitigation Strategy)  Analgesic:Hydrocodone/APAP 5/325 one tablet every 8 hours (15 mg/day) MME/day:15 mg/day.   Chauncey Fischer, RN  07/27/2018  1:46 PM  Sign when Signing Visit Nursing Pain Medication Assessment:  Safety precautions to be maintained throughout the outpatient stay will include: orient to surroundings, keep bed in low position, maintain call bell within reach at all times, provide assistance with transfer out of bed and ambulation.  Medication Inspection Compliance: Pill count conducted under aseptic conditions, in front of the patient. Neither the pills nor the bottle was removed from the patient's sight at any time. Once count was completed pills were immediately returned to the patient in their original bottle.  Medication: Hydrocodone/APAP Pill/Patch Count: 2 of 90 pills remain Pill/Patch Appearance: Markings consistent with prescribed medication Bottle  Appearance: Standard pharmacy container. Clearly labeled. Filled Date: 30 / 12 / 2019 Last Medication intake:  Today   Pharmacokinetics: Liberation and absorption (onset of action): WNL Distribution (time to peak effect): WNL Metabolism and excretion (duration of action): WNL         Pharmacodynamics: Desired effects: Analgesia: Tasha Reyes reports >50% benefit. Functional ability: Patient reports that medication allows her to accomplish basic ADLs Clinically meaningful improvement in function (CMIF): Sustained CMIF goals met Perceived effectiveness: Described as relatively effective, allowing for increase in activities of daily living (ADL) Undesirable effects: Side-effects or Adverse reactions: None reported Monitoring: [place] PMP: Online review of the past 26-monthperiod conducted. Compliant with practice rules and regulations Last UDS on record: Summary  Date Value Ref Range Status  10/21/2017 FINAL  Final    Comment:    ==================================================================== TOXASSURE SELECT 13 (MW) ==================================================================== Test                             Result       Flag       Units Drug Present not Declared for Prescription Verification   Hydrocodone                    1014         UNEXPECTED ng/mg creat   Hydromorphone                  211          UNEXPECTED ng/mg creat   Dihydrocodeine                 221          UNEXPECTED ng/mg creat   Norhydrocodone                 1450         UNEXPECTED ng/mg creat    Sources of hydrocodone include scheduled prescription    medications. Hydromorphone, dihydrocodeine and norhydrocodone are    expected metabolites of hydrocodone. Hydromorphone and    dihydrocodeine are also available as scheduled prescription    medications. ==================================================================== Test                      Result    Flag   Units      Ref Range   Creatinine               180              mg/dL      >=20 ==================================================================== Declared Medications:  The flagging and interpretation on this report are based on the  following declared medications.  Unexpected results may arise from  inaccuracies in the declared medications.  **Note: The testing scope of this panel does not include following  reported medications:  Atenolol  Atorvastatin  Cholecalciferol  Clopidogrel  Iron (Ferrous Sulfate)  Methocarbamol  Polyethylene Glycol  Prednisone ==================================================================== For clinical consultation, please call [phone] ====================================================================    UDS interpretation: Compliant          Medication Assessment Form: Reviewed. Patient indicates being compliant with therapy Treatment compliance: Compliant Risk Assessment Profile: Aberrant behavior: See prior evaluations. None observed or detected today Comorbid factors increasing risk of overdose: See prior notes. No additional risks detected today Opioid risk tool (ORT) (Total Score): 3 Personal History of Substance Abuse (SUD-Substance use disorder):  Alcohol: Negative  Illegal Drugs: Negative  Rx Drugs: Negative  ORT Risk Level calculation: Low Risk Risk of substance use disorder (SUD): Low Opioid  Risk Tool - 07/27/18 1358      Family History of Substance Abuse   Alcohol  Positive Female    Illegal Drugs  Negative    Rx Drugs  Negative      Personal History of Substance Abuse   Alcohol  Negative    Illegal Drugs  Negative    Rx Drugs  Negative      Age   Age between 75-45 years   No      History of Preadolescent Sexual Abuse   History of Preadolescent Sexual Abuse  Negative or Female      Psychological Disease   Psychological Disease  Negative    Depression  Negative      Total Score   Opioid Risk Tool Scoring  3    Opioid Risk Interpretation  Low Risk       ORT Scoring interpretation table:  Score <3 = Low Risk for SUD  Score between 4-7 = Moderate Risk for SUD  Score >8 = High Risk for Opioid Abuse   Risk Mitigation Strategies:  Patient Counseling: Covered Patient-Prescriber Agreement (PPA): Present and active  Notification to other healthcare providers: Done  Pharmacologic Plan: No change in therapy, at this time.             Laboratory Chemistry  Inflammation Markers (CRP: Acute Phase) (ESR: Chronic Phase) Lab Results  Component Value Date   CRP 5 01/20/2018   ESRSEDRATE 23 01/20/2018                         Rheumatology Markers No results found for: RF, ANA, LABURIC, URICUR, LYMEIGGIGMAB, LYMEABIGMQN, HLAB27                      Renal Function Markers Lab Results  Component Value Date   BUN 29 (H) 05/23/2018   CREATININE 1.03 (H) 05/23/2018   BCR 10 (L) 01/20/2018   GFRAA 56 (L) 05/23/2018   GFRNONAA 48 (L) 05/23/2018                             Hepatic Function Markers Lab Results  Component Value Date   AST 24 05/20/2018   ALT 19 05/20/2018   ALBUMIN 3.7 05/20/2018   ALKPHOS 64 05/20/2018                        Electrolytes Lab Results  Component Value Date   NA 140 05/23/2018   K 4.3 05/23/2018   CL 99 05/23/2018   CALCIUM 8.5 (L) 05/23/2018   MG 1.8 01/20/2018                        Neuropathy Markers Lab Results  Component Value Date   VITAMINB12 923 01/20/2018                        CNS Tests No results found for: COLORCSF, APPEARCSF, RBCCOUNTCSF, WBCCSF, POLYSCSF, LYMPHSCSF, EOSCSF, PROTEINCSF, GLUCCSF, JCVIRUS, CSFOLI, IGGCSF                      Bone Pathology Markers Lab Results  Component Value Date   VD25OH 5.0 (L) 01/08/2016   25OHVITD1 41 01/20/2018   25OHVITD2 13 01/20/2018   25OHVITD3 28 01/20/2018  Coagulation Parameters Lab Results  Component Value Date   INR 0.97 09/06/2017   LABPROT 12.8 09/06/2017   APTT 110.0 (H) 11/15/2011   PLT 518 (H)  05/21/2018                        Cardiovascular Markers Lab Results  Component Value Date   BNP 1,055.0 (H) 05/20/2018   TROPONINI <0.03 05/20/2018   HGB 13.0 05/21/2018   HCT 42.1 05/21/2018                         CA Markers No results found for: CEA, CA125, LABCA2                      Note: Lab results reviewed.  Recent Diagnostic Imaging Results  DG Chest 2 View CLINICAL DATA:  85 year old female with right lower lobe infection last month. Continued productive cough with low-grade fever.  EXAM: CHEST - 2 VIEW  COMPARISON:  Chest radiographs 05/20/2018 and earlier.  FINDINGS: Chronic pulmonary hyperinflation. Mediastinal contours are stable since 2016 and within normal limits. Calcified aortic atherosclerosis. Visualized tracheal air column is within normal limits. No pneumothorax, pulmonary edema, pleural effusion or consolidation. Patchy right lower lobe opacity seen in November has resolved. Osteopenia. No acute osseous abnormality identified.  IMPRESSION: Chronic lung disease with pulmonary hyperinflation. Resolved right lower lobe opacity and small pleural effusions since November. No acute cardiopulmonary abnormality.  Electronically Signed   By: Genevie Ann M.D.   On: 07/01/2018 07:52  Complexity Note: Imaging results reviewed. Results shared with Ms. Mcgibbon, using [employer].                         Meds   Current Outpatient Medications:  .  albuterol (PROVENTIL) (2.5 MG/3ML) 0.083% nebulizer solution, USE 1 VIAL VIA NEBULIZER EVERY 4 HOURS AS NEEDED FOR WHEEZING OR SHORTNESS OF BREATH (Patient taking differently: Take 2.5 mg by nebulization every 4 (four) hours as needed. ), Disp: 75 mL, Rfl: 0 .  atenolol (TENORMIN) 25 MG tablet, Take 1 tablet (25 mg total) by mouth 2 (two) times daily., Disp: 180 tablet, Rfl: 1 .  atorvastatin (LIPITOR) 10 MG tablet, TAKE 1 TABLET BY MOUTH EVERY NIGHT AT BEDTIME (Patient taking differently: Take 10 mg by mouth at  bedtime. ), Disp: 90 tablet, Rfl: 3 .  Cholecalciferol (VITAMIN D3) 2000 units capsule, Take 1 capsule (2,000 Units total) by mouth daily., Disp: 30 capsule, Rfl: 12 .  clopidogrel (PLAVIX) 75 MG tablet, TAKE 1 TABLET BY MOUTH DAILY (Patient taking differently: Take 75 mg by mouth daily. ), Disp: 90 tablet, Rfl: 4 .  FEROSUL 325 (65 Fe) MG tablet, TAKE 1 TABLET(325 MG) BY MOUTH DAILY, Disp: 30 tablet, Rfl: 5 .  furosemide (LASIX) 20 MG tablet, Take 1 tablet (20 mg total) by mouth daily., Disp: 90 tablet, Rfl: 1 .  gabapentin (NEURONTIN) 300 MG capsule, Take 1 capsule (300 mg total) by mouth at bedtime., Disp: 90 capsule, Rfl: 1 .  mirtazapine (REMERON) 15 MG tablet, Take 15 mg by mouth at bedtime. , Disp: , Rfl: 5 .  NON FORMULARY, 3 lpm O2 via [place] at night and during the day PRN, Disp: , Rfl:  .  OXYGEN, Inhale 2 L/min into the lungs. Use via nasal cannula at night and during the day as needed, Disp: , Rfl:  .  potassium chloride SA (  K-DUR,KLOR-CON) 10 MEQ tablet, Take 2 tablets (20 mEq total) by mouth daily., Disp: 30 tablet, Rfl: 0 .  HYDROcodone-acetaminophen (NORCO/VICODIN) 5-325 MG tablet, Take 1 tablet by mouth every 8 (eight) hours as needed for up to 30 days for severe pain., Disp: 90 tablet, Rfl: 0 .  [START ON 08/26/2018] HYDROcodone-acetaminophen (NORCO/VICODIN) 5-325 MG tablet, Take 1 tablet by mouth every 8 (eight) hours as needed for up to 30 days for severe pain., Disp: 90 tablet, Rfl: 0 .  [START ON 09/25/2018] HYDROcodone-acetaminophen (NORCO/VICODIN) 5-325 MG tablet, Take 1 tablet by mouth every 8 (eight) hours as needed for up to 30 days for severe pain., Disp: 90 tablet, Rfl: 0 .  methocarbamol (ROBAXIN) 750 MG tablet, Take 1 tablet (750 mg total) by mouth 3 (three) times daily as needed for muscle spasms., Disp: 90 tablet, Rfl: 2 .  ondansetron (ZOFRAN-ODT) 8 MG disintegrating tablet, Take 1 tablet (8 mg total) by mouth 2 (two) times daily for 15 days., Disp: 30 tablet, Rfl: 0  ROS   Constitutional: Denies any fever or chills Gastrointestinal: No reported hemesis, hematochezia, vomiting, or acute GI distress Musculoskeletal: Denies any acute onset joint swelling, redness, loss of ROM, or weakness Neurological: No reported episodes of acute onset apraxia, aphasia, dysarthria, agnosia, amnesia, paralysis, loss of coordination, or loss of consciousness  Allergies  Ms. Dubinsky has No Known Allergies.  PFSH  Drug: Ms. Trettin  reports no history of drug use. Alcohol:  reports no history of alcohol use. Tobacco:  reports that she has been smoking cigarettes. She has a 15.25 pack-year smoking history. She has never used smokeless tobacco. Medical:  has a past medical history of Acute on chronic respiratory failure ([place]) (05/20/2018), Aneurysm of iliac artery (HCC) (05/11/2015), CHF (congestive heart failure) ([place]), COPD (chronic obstructive pulmonary disease) ([place]), Cystitis, History of DVT (deep vein thrombosis) (05/11/2015), History of peptic ulcer disease (05/11/2015), [name], Hypertension, Losing weight, Migraines, [name], Rectal bleeding, Stroke ([place]), and Vascular disease. Surgical: Ms. Ruppel  has a past surgical history that includes Partial hysterectomy; [name]; [name], 2008); Back surgery; Colonoscopy (2012); Esophagogastroduodenoscopy (04/2013); Abdominal hysterectomy; Appendectomy; blood clot ; Hip Arthroplasty (Right, 06/17/2017); and Joint replacement (Right, 06/19/2017). Family: family history includes Brain cancer in her father; Colon cancer in her brother; Kidney disease in her mother.  Constitutional Exam  General appearance: Well nourished, well developed, and well hydrated. In no apparent acute distress Vitals:   07/27/18 1354  BP: (!) 156/85  Pulse: 70  Temp: 97.7 F (36.5 C)  SpO2: 96%  Weight: 94 lb (42.6 kg)  Height: '5\' 6"'  (1.676  m)  Psych/Mental status: Alert, oriented x 3 (person, place, & time)       Eyes: PERLA Respiratory: No evidence of acute respiratory distress  Cervical Spine Area Exam  Skin & Axial Inspection: No masses, redness, edema, swelling, or associated skin lesions Alignment: Symmetrical Functional ROM: Unrestricted ROM      Stability: No instability detected Muscle Tone/Strength: Functionally intact. No obvious neuro-muscular anomalies detected. Sensory (Neurological): Unimpaired Palpation: No palpable anomalies              Upper Extremity (UE) Exam    Side: Right upper extremity  Side: Left upper extremity  Skin & Extremity Inspection: Skin color, temperature, and hair growth are WNL. No peripheral edema or cyanosis. No masses, redness, swelling, asymmetry, or associated skin lesions. No contractures.  Skin & Extremity Inspection: Skin color,  temperature, and hair growth are WNL. No peripheral edema or cyanosis. No masses, redness, swelling, asymmetry, or associated skin lesions. No contractures.  Functional ROM: Unrestricted ROM          Functional ROM: Unrestricted ROM          Muscle Tone/Strength: Functionally intact. No obvious neuro-muscular anomalies detected.  Muscle Tone/Strength: Functionally intact. No obvious neuro-muscular anomalies detected.  Sensory (Neurological): Unimpaired          Sensory (Neurological): Unimpaired          Palpation: No palpable anomalies              Palpation: No palpable anomalies               Thoracic Spine Area Exam  Skin & Axial Inspection: No masses, redness, or swelling Alignment: Symmetrical Functional ROM: Unrestricted ROM Stability: No instability detected Muscle Tone/Strength: Functionally intact. No obvious neuro-muscular anomalies detected. Sensory (Neurological): Unimpaired Muscle strength & Tone: No palpable anomalies  Lumbar Spine Area Exam  Skin & Axial Inspection: No masses, redness, or swelling Alignment: Symmetrical Functional  ROM: Unrestricted ROM       Stability: No instability detected Muscle Tone/Strength: Functionally intact. No obvious neuro-muscular anomalies detected. Sensory (Neurological): Unimpaired Palpation: No palpable anomalies       Provocative Tests: Hyperextension/rotation test: deferred today       Lumbar quadrant test (Kemp's test): deferred today       Lateral bending test: deferred today       Patrick's Maneuver: deferred today                     Gait & Posture Assessment  Ambulation: Unassisted Gait: Relatively normal for age and body habitus Posture: WNL   Lower Extremity Exam    Side: Right lower extremity  Side: Left lower extremity  Stability: No instability observed          Stability: No instability observed          Skin & Extremity Inspection: Skin color, temperature, and hair growth are WNL. No peripheral edema or cyanosis. No masses, redness, swelling, asymmetry, or associated skin lesions. No contractures.  Skin & Extremity Inspection: Skin color, temperature, and hair growth are WNL. No peripheral edema or cyanosis. No masses, redness, swelling, asymmetry, or associated skin lesions. No contractures.  Functional ROM: Unrestricted ROM                  Functional ROM: Unrestricted ROM                  Muscle Tone/Strength: Functionally intact. No obvious neuro-muscular anomalies detected.  Muscle Tone/Strength: Functionally intact. No obvious neuro-muscular anomalies detected.  Sensory (Neurological): Unimpaired        Sensory (Neurological): Unimpaired            Palpation: No palpable anomalies  Palpation: No palpable anomalies   Assessment  Primary Diagnosis & Pertinent Problem List: The primary encounter diagnosis was Lumbar foraminal stenosis (Right) (Severe at L4-5). Diagnoses of Lumbosacral Radiculopathy (Right), Chronic lower extremity pain (Bilateral) (R>L), Neurogenic pain, Musculoskeletal pain, Chronic pain syndrome, Nausea, and Long term prescription opiate use  were also pertinent to this visit.  Status Diagnosis  Controlled Controlled Controlled 1. Lumbar foraminal stenosis (Right) (Severe at L4-5)   2. Lumbosacral Radiculopathy (Right)   3. Chronic lower extremity pain (Bilateral) (R>L)   4. Neurogenic pain   5. Musculoskeletal pain   6. Chronic pain syndrome  7. Nausea   8. Long term prescription opiate use     Problems updated and reviewed during this visit: No problems updated. Plan of Care  Pharmacotherapy (Medications Ordered): Meds ordered this encounter  Medications  . methocarbamol (ROBAXIN) 750 MG tablet    Sig: Take 1 tablet (750 mg total) by mouth 3 (three) times daily as needed for muscle spasms.    Dispense:  90 tablet    Refill:  2    Do not add this medication to the electronic "Automatic Refill" notification system. Patient may have prescription filled one day early if pharmacy is closed on scheduled refill date.    Order Specific Question:   Supervising Provider    Answer:   Milinda Pointer [phone]  . HYDROcodone-acetaminophen (NORCO/VICODIN) 5-325 MG tablet    Sig: Take 1 tablet by mouth every 8 (eight) hours as needed for up to 30 days for severe pain.    Dispense:  90 tablet    Refill:  0    Do not place this medication, or any other prescription from our practice, on "Automatic Refill". Patient may have prescription filled one day early if pharmacy is closed on scheduled refill date.    Order Specific Question:   Supervising Provider    Answer:   Milinda Pointer [phone]  . HYDROcodone-acetaminophen (NORCO/VICODIN) 5-325 MG tablet    Sig: Take 1 tablet by mouth every 8 (eight) hours as needed for up to 30 days for severe pain.    Dispense:  90 tablet    Refill:  0    Do not place this medication, or any other prescription from our practice, on "Automatic Refill". Patient may have prescription filled one day early if pharmacy is closed on scheduled refill date.    Order Specific Question:   Supervising  Provider    Answer:   Milinda Pointer [phone]  . HYDROcodone-acetaminophen (NORCO/VICODIN) 5-325 MG tablet    Sig: Take 1 tablet by mouth every 8 (eight) hours as needed for up to 30 days for severe pain.    Dispense:  90 tablet    Refill:  0    Do not place this medication, or any other prescription from our practice, on "Automatic Refill". Patient may have prescription filled one day early if pharmacy is closed on scheduled refill date.    Order Specific Question:   Supervising Provider    Answer:   Milinda Pointer [phone]  . ondansetron (ZOFRAN-ODT) 8 MG disintegrating tablet    Sig: Take 1 tablet (8 mg total) by mouth 2 (two) times daily for 15 days.    Dispense:  30 tablet    Refill:  0    Do not add to the electronic "Automatic Refill" notification system. Patient may have prescription filled one day early if pharmacy is closed on scheduled refill date.    Order Specific Question:   Supervising Provider    Answer:   Milinda Pointer [[id number]]   New Prescriptions   No medications on file   Medications administered today: Johnda F. Montanye had no medications administered during this visit. Lab-work, procedure(s), and/or referral(s): No orders of the defined types were placed in this encounter.  Imaging and/or referral(s): None  Interventional therapies: Planned, scheduled, and/or pending:  Not at time.     Considering:  Diagnostic right L4-5 transforaminal epidural steroid injection  Diagnostic right L4-5 lumbar epidural steroid injection  Diagnostic bilateral lumbar facet block  Possible bilateral lumbar facet radiofrequency ablation    Palliative PRN  treatment(s):  Diagnostic right L4-5 transforaminal epidural steroid injection Diagnostic right L4-5 lumbar epidural steroid injection  Diagnostic bilateral lumbar facet block    Provider-requested follow-up: Return in about 3 months (around 10/26/2018) for MedMgmt.  Future Appointments  Date Time  Provider [place]  04/19/2019 10:00 AM [facility] 2 [facility] None  04/19/2019 11:00 AM [facility] 2 [facility] None  04/19/2019 11:45 AM Kris Hartmann, NP [facility] None   Primary Care Physician: Glean Hess, MD Location: [facility] Note by: Vevelyn Francois NP Date: 07/27/2018; Time: 3:27 PM  Pain Score Disclaimer: We use the NRS-11 scale. This is a self-reported, subjective measurement of pain severity with only modest accuracy. It is used primarily to identify changes within a particular patient. It must be understood that outpatient pain scales are significantly less accurate that those used for research, where they can be applied under ideal controlled circumstances with minimal exposure to variables. In reality, the score is likely to be a combination of pain intensity and pain affect, where pain affect describes the degree of emotional arousal or changes in action readiness caused by the sensory experience of pain. Factors such as social and work situation, setting, emotional state, anxiety levels, expectation, and prior pain experience may influence pain perception and show large inter-individual differences that may also be affected by time variables.  Patient instructions provided during this appointment: Patient Instructions  ____________________________________________________________________________________________  Medication Rules  Purpose: To inform patients, and their family members, of our rules and regulations.  Applies to: All patients receiving prescriptions (written or electronic).  Pharmacy of record: Pharmacy where electronic prescriptions will be sent. If written prescriptions are taken to a different pharmacy, please inform the nursing staff. The pharmacy listed in the electronic medical record should be the one where you would like electronic prescriptions to be sent.  Electronic prescriptions: In compliance with the [place] (STOP) Act of 2017 (Session Lanny Cramp [phone]), effective July 15, 2018, all controlled substances must be electronically prescribed. Calling prescriptions to the pharmacy will cease to exist.  Prescription refills: Only during scheduled appointments. Applies to all prescriptions.  NOTE: The following applies primarily to controlled substances (Opioid* Pain Medications).   Patient's responsibilities: 1. Pain Pills: Bring all pain pills to every appointment (except for procedure appointments). 2. Pill Bottles: Bring pills in original pharmacy bottle. Always bring the newest bottle. Bring bottle, even if empty. 3. Medication refills: You are responsible for knowing and keeping track of what medications you take and those you need refilled. The day before your appointment: write a list of all prescriptions that need to be refilled. The day of the appointment: give the list to the admitting nurse. Prescriptions will be written only during appointments. If you forget a medication: it will not be "Called in", "Faxed", or "electronically sent". You will need to get another appointment to get these prescribed. No early refills. Do not call asking to have your prescription filled early. 4. Prescription Accuracy: You are responsible for carefully inspecting your prescriptions before leaving our office. Have the discharge nurse carefully go over each prescription with you, before taking them home. Make sure that your name is accurately spelled, that your address is correct. Check the name and dose of your medication to make sure it is accurate. Check the number of pills, and the written instructions to make sure they are clear and accurate. Make sure that you are given enough medication to last until  your next medication refill appointment. 5. Taking Medication: Take medication as prescribed. When it comes to controlled substances, taking less pills or less  frequently than prescribed is permitted and encouraged. Never take more pills than instructed. Never take medication more frequently than prescribed.  6. Inform other Doctors: Always inform, all of your healthcare providers, of all the medications you take. 7. Pain Medication from other Providers: You are not allowed to accept any additional pain medication from any other Doctor or Healthcare provider. There are two exceptions to this rule. (see below) In the event that you require additional pain medication, you are responsible for notifying us, as stated below. 8. Medication Agreement: You are responsible for carefully reading and following our Medication Agreement. This must be signed before receiving any prescriptions from our practice. Safely store a copy of your signed Agreement. Violations to the Agreement will result in no further prescriptions. (Additional copies of our Medication Agreement are available upon request.) 9. Laws, Rules, & Regulations: All patients are expected to follow all Federal and [employer], [employer], Rules, [employer] [facility]. Ignorance of the Laws does not constitute a valid excuse. The use of any illegal substances is prohibited. 10. Adopted CDC guidelines & recommendations: Target dosing levels will be at or below 60 MME/day. Use of benzodiazepines** is not recommended.  Exceptions: There are only two exceptions to the rule of not receiving pain medications from other Healthcare Providers. 1. Exception #1 (Emergencies): In the event of an emergency (i.e.: accident requiring emergency care), you are allowed to receive additional pain medication. However, you are responsible for: As soon as you are able, call our office [phone], at any time of the day or night, and leave a message stating your name, the date and nature of the emergency, and the name and dose of the medication prescribed. In the event that your call is answered by a member of our staff, make sure to  document and save the date, time, and the name of the person that took your information.  2. Exception #2 (Planned Surgery): In the event that you are scheduled by another doctor or dentist to have any type of surgery or procedure, you are allowed (for a period no longer than 30 days), to receive additional pain medication, for the acute post-op pain. However, in this case, you are responsible for picking up a copy of our "Post-op Pain Management for Surgeons" handout, and giving it to your surgeon or dentist. This document is available at our office, and does not require an appointment to obtain it. Simply go to our office during business hours (Monday-Thursday from 8:00 AM to 4:00 PM) (Friday 8:00 AM to 12:00 Noon) or if you have a scheduled appointment with [place], prior to your surgery, and ask for it by name. In addition, you will need to provide [place] with your name, name of your surgeon, type of surgery, and date of procedure or surgery.  *Opioid medications include: morphine, codeine, oxycodone, oxymorphone, hydrocodone, hydromorphone, meperidine, tramadol, tapentadol, buprenorphine, fentanyl, methadone. **Benzodiazepine medications include: diazepam (Valium), alprazolam (Xanax), clonazepam (Klonopine), lorazepam (Ativan), clorazepate (Tranxene), chlordiazepoxide (Librium), estazolam (Prosom), oxazepam (Serax), temazepam (Restoril), triazolam (Halcion) (Last updated: 09/11/2017) ____________________________________________________________________________________________

## 2018-08-23 ENCOUNTER — Other Ambulatory Visit: Payer: Self-pay | Admitting: Internal Medicine

## 2018-08-24 ENCOUNTER — Telehealth: Payer: Self-pay | Admitting: Internal Medicine

## 2018-08-24 NOTE — Telephone Encounter (Signed)
Called to schedule Medicare Annual Wellness Visit with the Nurse Health Advisor. No answer.  Left message on answering machine.  If patient returns call, please note: their last AWV was on 03/19/17,  please schedule AWV with [facility] any date AFTER 03/19/2018.  Thank you! For any questions please contact: Janace Hoard at [phone] or Skype [email or web address]

## 2018-09-02 ENCOUNTER — Ambulatory Visit: Payer: Medicare Other

## 2018-09-09 ENCOUNTER — Other Ambulatory Visit: Payer: Self-pay | Admitting: Internal Medicine

## 2018-09-09 ENCOUNTER — Ambulatory Visit (INDEPENDENT_AMBULATORY_CARE_PROVIDER_SITE_OTHER): Payer: Medicare Other

## 2018-09-09 VITALS — BP 100/60 | HR 82 | Temp 97.6°F | Resp 17 | Ht 66.0 in | Wt 96.2 lb

## 2018-09-09 DIAGNOSIS — J449 Chronic obstructive pulmonary disease, unspecified: Secondary | ICD-10-CM

## 2018-09-09 DIAGNOSIS — Z0000 Encounter for general adult medical examination without abnormal findings: Secondary | ICD-10-CM | POA: Diagnosis not present

## 2018-09-09 DIAGNOSIS — K219 Gastro-esophageal reflux disease without esophagitis: Secondary | ICD-10-CM

## 2018-09-09 MED ORDER — PANTOPRAZOLE SODIUM 40 MG PO TBEC: 40.0000 mg | DELAYED_RELEASE_TABLET | Freq: Two times a day (BID) | ORAL | 5 refills | Status: DC

## 2018-09-09 MED ORDER — ALBUTEROL SULFATE (2.5 MG/3ML) 0.083% IN NEBU: INHALATION_SOLUTION | RESPIRATORY_TRACT | 0 refills | Status: DC

## 2018-09-09 NOTE — Progress Notes (Signed)
Subjective:   Tasha Reyes is a 85 y.o. female who presents for Medicare Annual (Subsequent) preventive examination.  Review of Systems:   Cardiac Risk Factors include: advanced age (>75men, >15 women);hypertension;dyslipidemia;smoking/ tobacco exposure;sedentary lifestyle     Objective:     Vitals: BP 100/60 (BP Location: Left Arm, Patient Position: Sitting, Cuff Size: Normal)   Pulse 82   Temp 97.6 F (36.4 C) (Oral)   Resp 17   Ht 5\' 6"  (1.676 m)   Wt 96 lb 3.2 oz (43.6 kg)   SpO2 (!) 82%   BMI 15.53 kg/m   Body mass index is 15.53 kg/m.  Advanced Directives 09/09/2018 05/26/2018 05/20/2018 05/20/2018 05/20/2018 04/22/2018 09/06/2017  Does Patient Have a Medical Advance Directive? Yes Yes Yes Yes Yes Yes Yes  Type of Paramedic of [place];Living will [place];Living will [place];Living will - [place];Living will [place];Living will Living will  Does patient want to make changes to medical advance directive? - - - - - - No - Patient declined  Copy of [place] in Chart? No - copy requested No - copy requested No - copy requested - - No - copy requested No - copy requested  Would patient like information on creating a medical advance directive? - - - - - - -    Tobacco Social History   Tobacco Use  Smoking Status Current Every Day Smoker  . Packs/day: 0.25  . Years: 61.00  . Pack years: 15.25  . Types: Cigarettes  Smokeless Tobacco Never Used  Tobacco Comment   4 cigrettes a day- cut back     Ready to quit: Not Answered Counseling given: Not Answered Comment: 4 cigrettes a day- cut back   Clinical Intake:  Pre-visit preparation completed: Yes  Pain : 0-10 Pain Score: 5  Pain Type: Chronic pain Pain Location: Leg Pain Orientation: Right, Left Pain Descriptors / Indicators: Aching Pain Onset: More than a month ago Pain Frequency:  Constant     BMI - recorded: 15.53 Nutritional Status: BMI <19  Underweight Nutritional Risks: Nausea/ vomitting/ diarrhea(nausea) Diabetes: No  How often do you need to have someone help you when you read instructions, pamphlets, or other written materials from your doctor or pharmacy?: 1 - Never  Interpreter Needed?: No  Information entered by :: Clemetine Marker LPN  Past Medical History:  Diagnosis Date  . Acute on chronic respiratory failure ([place]) 05/20/2018  . Aneurysm of iliac artery ([facility]) 05/11/2015  . CHF (congestive heart failure) ([place])   . COPD (chronic obstructive pulmonary disease) ([place])   . Cystitis   . History of DVT (deep vein thrombosis) 05/11/2015  . History of peptic ulcer disease 05/11/2015  . [name]   . Hypertension   . Insomnia   . Losing weight   . Migraines   . [name]   . Rectal bleeding   . Stroke ([place])   . Vascular disease    Past Surgical History:  Procedure Laterality Date  . ABDOMINAL AORTIC ANEURYSM REPAIR W/ ENDOLUMINAL GRAFT  2008  . ABDOMINAL HYSTERECTOMY    . APPENDECTOMY    . BACK SURGERY    . blood clot      removal right leg  . COLONOSCOPY  2012  . ESOPHAGOGASTRODUODENOSCOPY  04/2013   gastric ulcers  . FEMORAL-POPLITEAL BYPASS GRAFT Right 2008  . HIP ARTHROPLASTY Right 06/17/2017   Procedure: ARTHROPLASTY BIPOLAR HIP (  HEMIARTHROPLASTY);  Surgeon: Earnestine Leys, MD;  Location: [facility] ORS;  Service: Orthopedics;  Laterality: Right;  . JOINT REPLACEMENT Right 06/19/2017   right hip FX.  Marland Kitchen PARTIAL HYSTERECTOMY     Family History  Problem Relation Age of Onset  . Brain cancer Father   . Kidney disease Mother   . Colon cancer Brother    Social History   Socioeconomic History  . Marital status: Widowed    Spouse name: Not on file  . Number of children: 1  . Years of education: Not on file  . Highest education level: Associate degree: academic program  Occupational History  . Not on file  Social  Needs  . Financial resource strain: Not hard at all  . Food insecurity:    Worry: Never true    Inability: Never true  . Transportation needs:    Medical: No    Non-medical: No  Tobacco Use  . Smoking status: Current Every Day Smoker    Packs/day: 0.25    Years: 61.00    Pack years: 15.25    Types: Cigarettes  . Smokeless tobacco: Never Used  . Tobacco comment: 4 cigrettes a day- cut back  Substance and Sexual Activity  . Alcohol use: No    Alcohol/week: 0.0 standard drinks  . Drug use: No  . Sexual activity: Not Currently    Birth control/protection: Post-menopausal  Lifestyle  . Physical activity:    Days per week: 0 days    Minutes per session: 0 min  . Stress: To some extent  Relationships  . Social connections:    Talks on phone: More than three times a week    Gets together: Three times a week    Attends religious service: Never    Active member of club or organization: No    Attends meetings of clubs or organizations: Never    Relationship status: Widowed  Other Topics Concern  . Not on file  Social History Narrative  . Not on file    Outpatient Encounter Medications as of 09/09/2018  Medication Sig  . albuterol (PROVENTIL) (2.5 MG/3ML) 0.083% nebulizer solution USE 1 VIAL VIA NEBULIZER EVERY 4 HOURS AS NEEDED FOR WHEEZING OR SHORTNESS OF BREATH (Patient taking differently: Take 2.5 mg by nebulization every 4 (four) hours as needed. )  . atenolol (TENORMIN) 25 MG tablet Take 1 tablet (25 mg total) by mouth 2 (two) times daily.  Marland Kitchen atorvastatin (LIPITOR) 10 MG tablet TAKE 1 TABLET BY MOUTH EVERY NIGHT AT BEDTIME (Patient taking differently: Take 10 mg by mouth at bedtime. )  . clopidogrel (PLAVIX) 75 MG tablet TAKE 1 TABLET BY MOUTH DAILY (Patient taking differently: Take 75 mg by mouth daily. )  . FEROSUL 325 (65 Fe) MG tablet TAKE 1 TABLET(325 MG) BY MOUTH DAILY  . furosemide (LASIX) 20 MG tablet Take 1 tablet (20 mg total) by mouth daily.  Marland Kitchen gabapentin  (NEURONTIN) 300 MG capsule Take 1 capsule (300 mg total) by mouth at bedtime.  Marland Kitchen HYDROcodone-acetaminophen (NORCO/VICODIN) 5-325 MG tablet Take 1 tablet by mouth every 8 (eight) hours as needed for up to 30 days for severe pain.  . methocarbamol (ROBAXIN) 750 MG tablet Take 1 tablet (750 mg total) by mouth 3 (three) times daily as needed for muscle spasms.  . mirtazapine (REMERON) 15 MG tablet TAKE 1 TABLET(15 MG) BY MOUTH AT BEDTIME  . NON FORMULARY 3 lpm O2 via [place] at night and during the day PRN  . OXYGEN Inhale 2  L/min into the lungs. Use via nasal cannula at night and during the day as needed  . Cholecalciferol (VITAMIN D3) 2000 units capsule Take 1 capsule (2,000 Units total) by mouth daily. (Patient not taking: Reported on 09/09/2018)  . [START ON 09/25/2018] HYDROcodone-acetaminophen (NORCO/VICODIN) 5-325 MG tablet Take 1 tablet by mouth every 8 (eight) hours as needed for up to 30 days for severe pain.  . potassium chloride SA (K-DUR,KLOR-CON) 10 MEQ tablet Take 2 tablets (20 mEq total) by mouth daily. (Patient not taking: Reported on 09/09/2018)   No facility-administered encounter medications on file as of 09/09/2018.     Activities of Daily Living In your present state of health, do you have any difficulty performing the following activities: 09/09/2018 05/29/2018  Hearing? Y N  Comment declines hearing aids -  Vision? N N  Comment wears glasses -  Difficulty concentrating or making decisions? N N  Walking or climbing stairs? N Y  Dressing or bathing? N Y  Doing errands, shopping? Y Y  Comment no longer drives -  Conservation officer, nature and eating ? N -  Using the Toilet? N -  In the past six months, have you accidently leaked urine? N -  Do you have problems with loss of bowel control? N -  Managing your Medications? N -  Managing your Finances? N -  Housekeeping or managing your Housekeeping? N -  Some recent data might be hidden    Patient Care Team: Glean Hess, MD as PCP -  General (Family Medicine) [facility] as Surgeon (Vascular Surgery) Manya Silvas, MD (Gastroenterology)    Assessment:   This is a routine wellness examination for [place].  Exercise Activities and Dietary recommendations Current Exercise Habits: The patient does not participate in regular exercise at present, Exercise limited by: cardiac condition(s);orthopedic condition(s);respiratory conditions(s)  Goals    . DIET - INCREASE WATER INTAKE     Recommend drinking 6-8 glasses of water per day       Fall Risk Fall Risk  09/09/2018 07/27/2018 05/29/2018 04/22/2018 03/02/2018  Falls in the past year? 1 0 1 No No  Number falls in past yr: 1 - 0 - -  Injury with Fall? 1 - 0 - -  Comment fell on steps and hit her head - - - -  Risk Factor Category  - - - - -  Risk for fall due to : - - - - -  Risk for fall due to: Comment - - - - -  Follow up - - - - -  FALL RISK PREVENTION PERTAINING TO THE HOME:  Any stairs in or around the home? Yes  If so, do they handrails? Yes   Home free of loose throw rugs in walkways, pet beds, electrical cords, etc? NO has throw rugs Adequate lighting in your home to reduce risk of falls? Yes   ASSISTIVE DEVICES UTILIZED TO PREVENT FALLS:  Life alert? No  Use of a cane, walker or w/c? Yes  Grab bars in the bathroom? Yes  Shower chair or bench in shower? Yes  Elevated toilet seat or a handicapped toilet? Yes  DME ORDERS:  DME order needed?  No   TIMED UP AND GO:  Was the test performed? Yes .  Length of time to ambulate 10 feet: 6 sec.   GAIT:  Appearance of gait: Gait stead-fast and with the use of an assistive device. Education: Fall risk prevention has been discussed.  Intervention(s) required? Yes  remove throw rugs  Depression Screen PHQ 2/9 Scores 09/09/2018 05/29/2018 05/26/2018 04/22/2018  PHQ - 2 Score 2 2 2  0  PHQ- 9 Score 10 5 - -     Cognitive Function     6CIT Screen 09/09/2018 03/19/2017  What Year? 0 points  0 points  What month? 0 points 0 points  What time? 0 points 0 points  Count back from 20 0 points 0 points  Months in reverse 4 points 0 points  Repeat phrase 8 points 2 points  Total Score 12 2    Immunization History  Administered Date(s) Administered  . Influenza, High Dose Seasonal PF 05/29/2018  . Influenza,inj,Quad PF,6+ Mos 03/19/2017  . Pneumococcal Conjugate-13 10/13/2015  . Pneumococcal Polysaccharide-23 01/18/2014    Qualifies for Shingles Vaccine? Yes  . Due for Shingrix. Education has been provided regarding the importance of this vaccine. Pt has been advised to call insurance company to determine out of pocket expense. Advised may also receive vaccine at local pharmacy or Health Dept. Verbalized acceptance and understanding.  Tdap: Although this vaccine is not a covered service during a Wellness Exam, does the patient still wish to receive this vaccine today?  No .  Education has been provided regarding the importance of this vaccine. Advised may receive this vaccine at local pharmacy or Health Dept. Aware to provide a copy of the vaccination record if obtained from local pharmacy or Health Dept. Verbalized acceptance and understanding.  Flu Vaccine: Up to date  Pneumococcal Vaccine: Up to date   Screening Tests Health Maintenance  Topic Date Due  . DEXA SCAN  07/27/1997  . TETANUS/TDAP  03/24/2019 (Originally 07/28/1949)  . INFLUENZA VACCINE  Completed  . PNA vac Low Risk Adult  Completed    Cancer Screenings:  Colorectal Screening: No longer required.   Mammogram: No longer required.   Bone Density: Not completed. Pt declines.   Lung Cancer Screening: (Low Dose CT Chest recommended if Age 84-80 years, 30 pack-year currently smoking OR have quit w/in 15years.) does qualify. Pt declines  Additional Screening:  Hepatitis C Screening: no longer required.  Vision Screening: Recommended annual ophthalmology exams for early detection of glaucoma and other  disorders of the eye. Is the patient up to date with their annual eye exam?  No  Who is the provider or what is the name of the office in which the pt attends annual eye exams? Not established If pt is not established with a provider, would they like to be referred to a provider to establish care? No .   Dental Screening: Recommended annual dental exams for proper oral hygiene  Community Resource Referral:  CRR required this visit?  No      Plan:     I have personally reviewed and addressed the Medicare Annual Wellness questionnaire and have noted the following in the patient's chart:  A. Medical and social history B. Use of alcohol, tobacco or illicit drugs  C. Current medications and supplements D. Functional ability and status E.  Nutritional status F.  Physical activity G. Advance directives H. List of other physicians I.  Hospitalizations, surgeries, and ER visits in previous 12 months J.  [place] such as hearing and vision if needed, cognitive and depression L. Referrals and appointments   In addition, I have reviewed and discussed with patient certain preventive protocols, quality metrics, and best practice recommendations. A written personalized care plan for preventive services as well as general preventive health recommendations were provided  to patient.   Signed,  Clemetine Marker, LPN Nurse Health Advisor   Nurse Notes: pt c/o pain in both legs and swelling. She is only using oxygen at night and occasionally during the day. Advised pt to schedule follow up office visit with Dr. Army Melia to discuss appetite due to pt requesting prednisone to help with appetite. She is using Boost shakes but says she still has to force herself to eat.

## 2018-09-09 NOTE — Patient Instructions (Signed)
Tasha Reyes , Thank you for taking time to come for your Medicare Wellness Visit. I appreciate your ongoing commitment to your health goals. Please review the following plan we discussed and let me know if I can assist you in the future.   Screening recommendations/referrals: Colonoscopy: no longer required2 Mammogram: no longer required Bone Density: due- please contact us if you would like to schedule this exam Recommended yearly ophthalmology/optometry visit for glaucoma screening and checkup Recommended yearly dental visit for hygiene and checkup  Vaccinations: Influenza vaccine: done 05/29/18 Pneumococcal vaccine: done 10/13/15 Tdap vaccine: due - please contact us if you get a cut or scrape Shingles vaccine: Shingrix discussed. Please contact your pharmacy for coverage information.   Advanced directives: Please bring a copy of your health care power of attorney and living will to the office at your convenience.  Conditions/risks identified: Recommend removing loose throw rugs to prevent falls.   Next appointment: Please follow up in one year for your Medicare Annual Wellness visit.     Preventive Care 46 Years and Older, Female Preventive care refers to lifestyle choices and visits with your health care provider that can promote health and wellness. What does preventive care include?  A yearly physical exam. This is also called an annual well check.  Dental exams once or twice a year.  Routine eye exams. Ask your health care provider how often you should have your eyes checked.  Personal lifestyle choices, including:  Daily care of your teeth and gums.  Regular physical activity.  Eating a healthy diet.  Avoiding tobacco a/nd drug use.  Limiting alcohol use.  Practicing safe sex.  Taking low-dose aspirin every day.  Taking vitamin and mineral supplements as recommended by your health care provider. What happens during an annual well check? The services and  screenings done by your health care provider during your annual well check will depend on your age, overall health, lifestyle risk factors, and family history of disease. Counseling  Your health care provider may ask you questions about your:  Alcohol use.  Tobacco use.  Drug use.  Emotional well-being.  Home and relationship well-being.  Sexual activity.  Eating habits.  History of falls.  Memory and ability to understand (cognition).  Work and work Statistician.  Reproductive health. Screening  You may have the following tests or measurements:  Height, weight, and BMI.  Blood pressure.  Lipid and cholesterol levels. These may be checked every 5 years, or more frequently if you are over 40 years old.  Skin check.  Lung cancer screening. You may have this screening every year starting at age 64 if you have a 30-pack-year history of smoking and currently smoke or have quit within the past 15 years.  Fecal occult blood test (FOBT) of the stool. You may have this test every year starting at age 36.  Flexible sigmoidoscopy or colonoscopy. You may have a sigmoidoscopy every 5 years or a colonoscopy every 10 years starting at age 10.  Hepatitis C blood test.  Hepatitis B blood test.  Sexually transmitted disease (STD) testing.  Diabetes screening. This is done by checking your blood sugar (glucose) after you have not eaten for a while (fasting). You may have this done every 1-3 years.  Bone density scan. This is done to screen for osteoporosis. You may have this done starting at age 51.  Mammogram. This may be done every 1-2 years. Talk to your health care provider about how often you should have regular mammograms.  Talk with your health care provider about your test results, treatment options, and if necessary, the need for more tests. Vaccines  Your health care provider may recommend certain vaccines, such as:  Influenza vaccine. This is recommended every  year.  Tetanus, diphtheria, and acellular pertussis (Tdap, Td) vaccine. You may need a Td booster every 10 years.  Zoster vaccine. You may need this after age 68.  Pneumococcal 13-valent conjugate (PCV13) vaccine. One dose is recommended after age 83.  Pneumococcal polysaccharide (PPSV23) vaccine. One dose is recommended after age 50. Talk to your health care provider about which screenings and vaccines you need and how often you need them. This information is not intended to replace advice given to you by your health care provider. Make sure you discuss any questions you have with your health care provider. Document Released: 07/28/2015 Document Revised: 03/20/2016 Document Reviewed: 05/02/2015 Elsevier Interactive Patient Education  2017 [place] Prevention in the Home Falls can cause injuries. They can happen to people of all ages. There are many things you can do to make your home safe and to help prevent falls. What can I do on the outside of my home?  Regularly fix the edges of walkways and driveways and fix any cracks.  Remove anything that might make you trip as you walk through a door, such as a raised step or threshold.  Trim any bushes or trees on the path to your home.  Use bright outdoor lighting.  Clear any walking paths of anything that might make someone trip, such as rocks or tools.  Regularly check to see if handrails are loose or broken. Make sure that both sides of any steps have handrails.  Any raised decks and porches should have guardrails on the edges.  Have any leaves, snow, or ice cleared regularly.  Use sand or salt on walking paths during winter.  Clean up any spills in your garage right away. This includes oil or grease spills. What can I do in the bathroom?  Use night lights.  Install grab bars by the toilet and in the tub and shower. Do not use towel bars as grab bars.  Use non-skid mats or decals in the tub or shower.  If you  need to sit down in the shower, use a plastic, non-slip stool.  Keep the floor dry. Clean up any water that spills on the floor as soon as it happens.  Remove soap buildup in the tub or shower regularly.  Attach bath mats securely with double-sided non-slip rug tape.  Do not have throw rugs and other things on the floor that can make you trip. What can I do in the bedroom?  Use night lights.  Make sure that you have a light by your bed that is easy to reach.  Do not use any sheets or blankets that are too big for your bed. They should not hang down onto the floor.  Have a firm chair that has side arms. You can use this for support while you get dressed.  Do not have throw rugs and other things on the floor that can make you trip. What can I do in the kitchen?  Clean up any spills right away.  Avoid walking on wet floors.  Keep items that you use a lot in easy-to-reach places.  If you need to reach something above you, use a strong step stool that has a grab bar.  Keep electrical cords out of the way.  Do not use floor polish or wax that makes floors slippery. If you must use wax, use non-skid floor wax.  Do not have throw rugs and other things on the floor that can make you trip. What can I do with my stairs?  Do not leave any items on the stairs.  Make sure that there are handrails on both sides of the stairs and use them. Fix handrails that are broken or loose. Make sure that handrails are as long as the stairways.  Check any carpeting to make sure that it is firmly attached to the stairs. Fix any carpet that is loose or worn.  Avoid having throw rugs at the top or bottom of the stairs. If you do have throw rugs, attach them to the floor with carpet tape.  Make sure that you have a light switch at the top of the stairs and the bottom of the stairs. If you do not have them, ask someone to add them for you. What else can I do to help prevent falls?  Wear shoes  that:  Do not have high heels.  Have rubber bottoms.  Are comfortable and fit you well.  Are closed at the toe. Do not wear sandals.  If you use a stepladder:  Make sure that it is fully opened. Do not climb a closed stepladder.  Make sure that both sides of the stepladder are locked into place.  Ask someone to hold it for you, if possible.  Clearly mark and make sure that you can see:  Any grab bars or handrails.  First and last steps.  Where the edge of each step is.  Use tools that help you move around (mobility aids) if they are needed. These include:  Canes.  Walkers.  Scooters.  Crutches.  Turn on the lights when you go into a dark area. Replace any light bulbs as soon as they burn out.  Set up your furniture so you have a clear path. Avoid moving your furniture around.  If any of your floors are uneven, fix them.  If there are any pets around you, be aware of where they are.  Review your medicines with your doctor. Some medicines can make you feel dizzy. This can increase your chance of falling. Ask your doctor what other things that you can do to help prevent falls. This information is not intended to replace advice given to you by your health care provider. Make sure you discuss any questions you have with your health care provider. Document Released: 04/27/2009 Document Revised: 12/07/2015 Document Reviewed: 08/05/2014 Elsevier Interactive Patient Education  2017 [employer].

## 2018-09-23 ENCOUNTER — Ambulatory Visit: Payer: Medicare Other | Admitting: Internal Medicine

## 2018-10-13 ENCOUNTER — Ambulatory Visit: Payer: Medicare Other | Attending: Nurse Practitioner | Admitting: Nurse Practitioner

## 2018-10-13 ENCOUNTER — Other Ambulatory Visit: Payer: Self-pay

## 2018-10-13 DIAGNOSIS — M7918 Myalgia, other site: Secondary | ICD-10-CM

## 2018-10-13 DIAGNOSIS — M79604 Pain in right leg: Secondary | ICD-10-CM | POA: Diagnosis not present

## 2018-10-13 DIAGNOSIS — M79605 Pain in left leg: Secondary | ICD-10-CM

## 2018-10-13 DIAGNOSIS — M48061 Spinal stenosis, lumbar region without neurogenic claudication: Secondary | ICD-10-CM | POA: Diagnosis not present

## 2018-10-13 DIAGNOSIS — M792 Neuralgia and neuritis, unspecified: Secondary | ICD-10-CM | POA: Diagnosis not present

## 2018-10-13 DIAGNOSIS — G8929 Other chronic pain: Secondary | ICD-10-CM | POA: Diagnosis not present

## 2018-10-13 DIAGNOSIS — G894 Chronic pain syndrome: Secondary | ICD-10-CM

## 2018-10-13 DIAGNOSIS — R110 Nausea: Secondary | ICD-10-CM | POA: Diagnosis not present

## 2018-10-13 DIAGNOSIS — E559 Vitamin D deficiency, unspecified: Secondary | ICD-10-CM | POA: Diagnosis not present

## 2018-10-13 MED ORDER — HYDROCODONE-ACETAMINOPHEN 5-325 MG PO TABS: 1.0000 | ORAL_TABLET | Freq: Three times a day (TID) | ORAL | 0 refills | Status: DC | PRN

## 2018-10-13 MED ORDER — VITAMIN D3 50 MCG (2000 UT) PO CAPS: ORAL_CAPSULE | ORAL | 12 refills | Status: DC

## 2018-10-13 MED ORDER — GABAPENTIN 300 MG PO CAPS: 300.0000 mg | ORAL_CAPSULE | Freq: Every day | ORAL | 0 refills | Status: DC

## 2018-10-13 MED ORDER — ONDANSETRON 8 MG PO TBDP: 8.0000 mg | ORAL_TABLET | Freq: Two times a day (BID) | ORAL | 0 refills | Status: DC

## 2018-10-13 MED ORDER — METHOCARBAMOL 750 MG PO TABS: 750.0000 mg | ORAL_TABLET | Freq: Three times a day (TID) | ORAL | 2 refills | Status: DC | PRN

## 2018-10-13 NOTE — Progress Notes (Addendum)
Virtual Visit via Telephone Note  I connected with Tasha Reyes on 10/13/18 at  9:15 AM EDT by telephone and verified that I am speaking with the correct person using two identifiers.   I discussed the limitations, risks, security and privacy concerns of performing an evaluation and management service by telephone and the availability of in person appointments. I also discussed with the patient that there may be a patient responsible charge related to this service. The patient expressed understanding and agreed to proceed.   History of Present Illness: Reason for Visit: Ms. Tasha Reyes is a 85 y.o. year old, female patient, who comes today with a chief complaint of No chief complaint on file. Last Appointment: Her last appointment at our practice was on 07/27/2018. I last saw her on 07/27/2018.  Pain Assessment: Today, Ms. Crum describes the severity a 2/10 of her legs. She admits that is starts in her buttock. She does continue to have swelling in her legs from her knees down. She admits that touching her legs makes the pain worse. She does have occasional times when she problems with her breathing at night. She has a lot of weakness in her legs. She admits that she does have to use her cane. She does have numbness tingling and pain. She has swelling her feet also. She does continue to smoke. She does wonder why she has the pain and swelling in her legs.    Observations/Objective: Physical Exam   Mental status: Alert, oriented x 3 (person, place, & time)       Respiratory: No evidence of acute respiratory distress Vitals: There were no vitals taken for this visit. BMI: Estimated body mass index is 15.53 kg/m as calculated from the following:   Height as of 09/09/18: 5\' 6"  (1.676 m).   Weight as of 09/09/18: 96 lb 3.2 oz (43.6 kg). Ideal: Patient weight not recorded    Assessment and Plan: Assessment   Status Diagnosis  Not responding Persistent Persistent 1. Chronic  lower extremity pain (Bilateral) (R>L)   2. Lumbar foraminal stenosis (Right) (Severe at L4-5)   3. Neurogenic pain   4. Chronic pain syndrome   5. Musculoskeletal pain   6. Vitamin D deficiency   7. Nausea   8. Chronic pain   9. Other chronic pain      Updated Problems: No problems updated.    Follow Up Instructions:Interventional therapies: Planned, scheduled, and/or pending:  Not at time.Instructed patient on lower extremity edema and some of the causes ie lumbar disorders, heart disease, vascular disease and chronic tobacco use. Verbalized understanding   Considering:  Diagnostic right L4-5 transforaminal epidural steroid injection  Diagnostic right L4-5 lumbar epidural steroid injection  Diagnostic bilateral lumbar facet block  Possible bilateral lumbar facet radiofrequency ablation    Palliative PRN treatment(s):  Diagnostic right L4-5 transforaminal epidural steroid injection Diagnostic right L4-5 lumbar epidural steroid injection  Diagnostic bilateral lumbar facet block       I discussed the assessment and treatment plan with the patient. The patient was provided an opportunity to ask questions and all were answered. The patient agreed with the plan and demonstrated an understanding of the instructions.   The patient was advised to call back or seek an in-person evaluation if the symptoms worsen or if the condition fails to improve as anticipated.  I provided 16 minutes of non-face-to-face time during this encounter.   Dionisio David, NP

## 2018-11-11 ENCOUNTER — Other Ambulatory Visit: Payer: Self-pay

## 2018-11-11 ENCOUNTER — Ambulatory Visit: Payer: Medicare Other | Admitting: Internal Medicine

## 2018-11-11 ENCOUNTER — Other Ambulatory Visit: Payer: Self-pay | Admitting: Internal Medicine

## 2018-11-11 DIAGNOSIS — I5032 Chronic diastolic (congestive) heart failure: Secondary | ICD-10-CM | POA: Insufficient documentation

## 2018-11-11 NOTE — Progress Notes (Deleted)
Date:  11/11/2018   Name:  Tasha Reyes   DOB:  1931-04-19   MRN:  [id number]   Chief Complaint: No chief complaint on file.  Hypertension  This is a chronic problem. The problem is controlled. Past treatments include beta blockers and diuretics. Hypertensive end-organ damage includes kidney disease and heart failure.  Hyperlipidemia  The problem is controlled. Current antihyperlipidemic treatment includes statins. The current treatment provides significant improvement of lipids.  CHF with preserved ejection fraction - followed by Cardiology.  Last seen 5 months ago.  No medication changes have been made. She is on beta blocker and lasix.  She has not had chest pain, pedal edema, PND or orthopnea.  COPD - she continues to use oxygen at night and during the day as needed. She was advised to use it around the clock but she finds it cumbersome to outside the house or car.  Lab Results  Component Value Date   CHOL 147 03/19/2017   HDL 57 03/19/2017   LDLCALC 63 03/19/2017   TRIG 135 03/19/2017   CHOLHDL 2.6 03/19/2017     Review of Systems  Patient Active Problem List   Diagnosis Date Noted  . Chronic diastolic heart failure ([place]) 05/29/2018  . Malnutrition of moderate degree 05/22/2018  . Chronic pain syndrome 03/02/2018  . Chronic bilateral low back pain 02/24/2018  . Bilateral leg numbness 02/24/2018  . Accidental medication overdose 02/22/2018  . DDD (degenerative disc disease), lumbar 02/03/2018  . Chronic anticoagulation (Plavix) 02/03/2018  . Chronic lower extremity pain (Bilateral) (R>L) 01/20/2018  . Disorder of skeletal system 01/20/2018  . Chronic hip pain (Right) 10/21/2017  . Rectal bleeding 09/06/2017  . History of repair of hip fracture 06/16/2017  . AAA (abdominal aortic aneurysm) without rupture ([place]) 03/18/2017  . Edema due to malnutrition ([place]) 10/28/2016  . Dependence on supplemental oxygen 09/04/2016  . Protein-calorie malnutrition, severe  08/20/2016  . Vitamin D deficiency 01/10/2016  . Opioid-induced constipation ([facility]) 11/07/2015  . Osteoporosis, post-menopausal 11/07/2015  . Complaints of weakness of lower extremities (Bilateral) 11/07/2015  . Lumbar foraminal stenosis (Right) (Severe at L4-5) 06/20/2015  . Chronic lower extremity pain (Location of Primary Source of Pain) (Right) 05/11/2015  . Lumbosacral Radiculopathy (Right) 05/11/2015  . Chronic radicular pain of lower extremity (Right) 05/11/2015  . Peripheral vascular disease ([place]) 05/11/2015  . Hx of long term use of blood thinners (Plavix) 05/11/2015  . Lower extremity pain (Right) 05/11/2015  . Neurogenic pain 05/11/2015  . History of DVT (deep vein thrombosis) 05/11/2015  . Lumbar facet syndrome (Bilateral) 05/11/2015  . Dyslipidemia 12/23/2014  . Compulsive tobacco user syndrome 12/23/2014  . History of bleeding peptic ulcer 12/23/2014  . Essential (primary) hypertension 12/23/2014  . Depression, major, single episode, complete remission ([place]) 12/23/2014  . COPD (chronic obstructive pulmonary disease) ([place]) 12/23/2014  . Restless leg 12/23/2014  . Chronic tension-type headache, intractable 12/23/2014    No Known Allergies  Past Surgical History:  Procedure Laterality Date  . ABDOMINAL AORTIC ANEURYSM REPAIR W/ ENDOLUMINAL GRAFT  2008  . ABDOMINAL HYSTERECTOMY    . APPENDECTOMY    . BACK SURGERY    . blood clot      removal right leg  . COLONOSCOPY  2012  . ESOPHAGOGASTRODUODENOSCOPY  04/2013   gastric ulcers  . FEMORAL-POPLITEAL BYPASS GRAFT Right 2008  . HIP ARTHROPLASTY Right 06/17/2017   Procedure: ARTHROPLASTY BIPOLAR HIP (HEMIARTHROPLASTY);  Surgeon: Earnestine Leys, MD;  Location: [facility] ORS;  Service: Orthopedics;  Laterality: Right;  . JOINT REPLACEMENT Right 06/19/2017   right hip FX.  Marland Kitchen PARTIAL HYSTERECTOMY      Social History   Tobacco Use  . Smoking status: Current Every Day Smoker    Packs/day: 0.25    Years: 61.00    Pack  years: 15.25    Types: Cigarettes  . Smokeless tobacco: Never Used  . Tobacco comment: 4 cigrettes a day- cut back  Substance Use Topics  . Alcohol use: No    Alcohol/week: 0.0 standard drinks  . Drug use: No     Medication list has been reviewed and updated.  No outpatient medications have been marked as taking for the 11/11/18 encounter (Appointment) with Glean Hess, MD.    [facility] 2/9 Scores 09/09/2018 05/29/2018 05/26/2018 04/22/2018  PHQ - 2 Score 2 2 2  0  PHQ- 9 Score 10 5 - -    BP Readings from Last 3 Encounters:  09/09/18 100/60  07/27/18 (!) 156/85  06/30/18 124/70    Physical Exam  Wt Readings from Last 3 Encounters:  09/09/18 96 lb 3.2 oz (43.6 kg)  07/27/18 94 lb (42.6 kg)  06/30/18 96 lb (43.5 kg)    There were no vitals taken for this visit.  Assessment and Plan:

## 2018-11-24 ENCOUNTER — Other Ambulatory Visit: Payer: Self-pay | Admitting: Internal Medicine

## 2018-11-24 DIAGNOSIS — I5032 Chronic diastolic (congestive) heart failure: Secondary | ICD-10-CM

## 2018-12-06 ENCOUNTER — Other Ambulatory Visit: Payer: Self-pay | Admitting: Internal Medicine

## 2018-12-24 ENCOUNTER — Other Ambulatory Visit: Payer: Self-pay | Admitting: Internal Medicine

## 2018-12-24 DIAGNOSIS — E559 Vitamin D deficiency, unspecified: Secondary | ICD-10-CM

## 2018-12-24 DIAGNOSIS — J449 Chronic obstructive pulmonary disease, unspecified: Secondary | ICD-10-CM

## 2019-01-07 ENCOUNTER — Telehealth: Payer: Self-pay

## 2019-01-07 NOTE — Telephone Encounter (Signed)
Second call was placed to patient.

## 2019-01-08 ENCOUNTER — Encounter: Payer: Self-pay | Admitting: Pain Medicine

## 2019-01-11 ENCOUNTER — Ambulatory Visit: Payer: Medicare Other | Attending: Nurse Practitioner | Admitting: Pain Medicine

## 2019-01-11 ENCOUNTER — Other Ambulatory Visit: Payer: Self-pay

## 2019-01-11 DIAGNOSIS — G894 Chronic pain syndrome: Secondary | ICD-10-CM

## 2019-01-11 DIAGNOSIS — M5441 Lumbago with sciatica, right side: Secondary | ICD-10-CM | POA: Diagnosis not present

## 2019-01-11 DIAGNOSIS — M5442 Lumbago with sciatica, left side: Secondary | ICD-10-CM

## 2019-01-11 DIAGNOSIS — G8929 Other chronic pain: Secondary | ICD-10-CM

## 2019-01-11 DIAGNOSIS — M7918 Myalgia, other site: Secondary | ICD-10-CM

## 2019-01-11 DIAGNOSIS — E43 Unspecified severe protein-calorie malnutrition: Secondary | ICD-10-CM

## 2019-01-11 DIAGNOSIS — R112 Nausea with vomiting, unspecified: Secondary | ICD-10-CM | POA: Diagnosis not present

## 2019-01-11 DIAGNOSIS — M79604 Pain in right leg: Secondary | ICD-10-CM

## 2019-01-11 DIAGNOSIS — M48061 Spinal stenosis, lumbar region without neurogenic claudication: Secondary | ICD-10-CM

## 2019-01-11 DIAGNOSIS — Z79899 Other long term (current) drug therapy: Secondary | ICD-10-CM | POA: Diagnosis not present

## 2019-01-11 DIAGNOSIS — Z789 Other specified health status: Secondary | ICD-10-CM | POA: Insufficient documentation

## 2019-01-11 MED ORDER — ONDANSETRON 8 MG PO TBDP: 8.0000 mg | ORAL_TABLET | Freq: Two times a day (BID) | ORAL | 0 refills | Status: DC

## 2019-01-11 MED ORDER — HYDROCODONE-ACETAMINOPHEN 5-325 MG PO TABS: 1.0000 | ORAL_TABLET | Freq: Three times a day (TID) | ORAL | 0 refills | Status: DC | PRN

## 2019-01-11 MED ORDER — METHOCARBAMOL 750 MG PO TABS: 750.0000 mg | ORAL_TABLET | Freq: Three times a day (TID) | ORAL | 2 refills | Status: DC | PRN

## 2019-01-11 NOTE — Progress Notes (Signed)
Pain Management Virtual Encounter Note - Virtual Visit via Telephone Telehealth (real-time audio visits between healthcare provider and patient).   Patient's Phone No. & Preferred Pharmacy:  [phone] (home); There is no such number on file (mobile).; (Preferred) [phone] No e-mail address on record  Tasha Reyes, [address] AT [address] [place] [address] Phone: [phone] Fax: [phone]    Pre-screening note:  Our staff contacted Tasha Reyes and offered her an "in person", "face-to-face" appointment versus a telephone encounter. She indicated preferring the telephone encounter, at this time.   Reason for Virtual Visit: COVID-19*  Social distancing based on CDC and AMA recommendations.   I contacted Tasha Reyes on 01/11/2019 via telephone.      I clearly identified myself as Gaspar Cola, MD. I verified that I was speaking with the correct person using two identifiers (Name: Tasha Reyes, and date of birth: 1930-08-07).  Advanced Informed Consent I sought verbal advanced consent from Tasha Reyes for virtual visit interactions. I informed Tasha Reyes of possible security and privacy concerns, risks, and limitations associated with providing "not-in-person" medical evaluation and management services. I also informed Tasha Reyes of the availability of "in-person" appointments. Finally, I informed her that there would be a charge for the virtual visit and that she could be  personally, fully or partially, financially responsible for it. Tasha Reyes expressed understanding and agreed to proceed.   Historic Elements   Tasha Reyes is a 85 y.o. year old, female patient evaluated today after her last encounter by our practice on 01/07/2019. Tasha Reyes  has a past medical history of Acute on chronic respiratory failure ([place]) (05/20/2018), Aneurysm of iliac artery ([place])  (05/11/2015), CHF (congestive heart failure) ([place]), COPD (chronic obstructive pulmonary disease) ([place]), Cystitis, History of DVT (deep vein thrombosis) (05/11/2015), History of peptic ulcer disease (05/11/2015), [name], Hypertension, Insomnia, Losing weight, Migraines, [name], Rectal bleeding, Stroke ([place]), [name]; [name]; [name], 2008); Back surgery; Colonoscopy (2012); Esophagogastroduodenoscopy (04/2013); Abdominal hysterectomy; Appendectomy; blood clot ; Hip Arthroplasty (Right, 06/17/2017); and Joint replacement (Right, 06/19/2017). Tasha Reyes has a current medication list which includes the following prescription(s): albuterol, atenolol, atorvastatin, vitamin d3, clopidogrel, ferosul, furosemide, hydrocodone-acetaminophen, hydrocodone-acetaminophen, hydrocodone-acetaminophen, methocarbamol, mirtazapine, oxygen-helium, pantoprazole, and ondansetron. She  reports that she has been smoking cigarettes. She has a 15.25 pack-year smoking history. She has never used smokeless tobacco. She reports that she does not drink alcohol or use drugs. Tasha Reyes has No Known Allergies.   HPI  Today, she is being contacted for medication management.  The patient indicates that her medications are working well and she is not experiencing any side effects or problems associated with them.  She is having some nausea and vomiting which initially she thought may be secondary to the gabapentin, but she stopped it and it never went away.  Further questioning revealed that the patient has been losing weight to where she now is weighing 87 pounds.  In addition, she describes nausea, vomiting, and some difficulty with swallowing.  She refers having gastroesophageal reflux disease and when I asked her about her last gastroscopy,  she indicated that she thought it was probably more than 2 years ago.  In view of the clinical signs and symptoms, today I  have ordered a complete metabolic panel and I have given orders to my staff to contact the patient the primary care physician Dr. Halina Maidens to see if she can see this patient and work her up for a possible upper GI malignancy.  The patient today requested some medication for nausea and vomiting and I have provided her with a prescription for some ondansetron.  However, I asked the patient several times to make sure that she contacts her primary care physician to follow-up with this since she describes that she has not informed her PCP of the symptoms.  For the time being and due to the patient's general medical status, I am planning on holding on any further interventional therapies until she can be fully worked up.  Pharmacotherapy Assessment  Analgesic: Hydrocodone/APAP 5/325 one tablet every 8 hours (15 mg/day) MME/day:15 mg/day.   Monitoring: Pharmacotherapy: No side-effects or adverse reactions reported. [place] PMP: PDMP reviewed during this encounter.       Compliance: No problems identified. Effectiveness: Clinically acceptable. Plan: Refer to "POC".  Pertinent Labs   SAFETY SCREENING Profile Lab Results  Component Value Date   STAPHAUREUS NEGATIVE 06/16/2017   MRSAPCR NEGATIVE 06/16/2017   Renal Function Lab Results  Component Value Date   BUN 29 (H) 05/23/2018   CREATININE 1.03 (H) 05/23/2018   BCR 10 (L) 01/20/2018   GFRAA 56 (L) 05/23/2018   GFRNONAA 48 (L) 05/23/2018   Hepatic Function Lab Results  Component Value Date   AST 24 05/20/2018   ALT 19 05/20/2018   ALBUMIN 3.7 05/20/2018   UDS Summary  Date Value Ref Range Status  10/21/2017 FINAL  Final    Comment:    ==================================================================== TOXASSURE SELECT 13 (MW) ==================================================================== Test                              Result       Flag       Units Drug Present not Declared for Prescription Verification   Hydrocodone                    1014         UNEXPECTED ng/mg creat   Hydromorphone                  211          UNEXPECTED ng/mg creat   Dihydrocodeine                 221          UNEXPECTED ng/mg creat   Norhydrocodone                 1450         UNEXPECTED ng/mg creat    Sources of hydrocodone include scheduled prescription    medications. Hydromorphone, dihydrocodeine and norhydrocodone are    expected metabolites of hydrocodone. Hydromorphone and    dihydrocodeine are also available as scheduled prescription    medications. ==================================================================== Test                      Result    Flag   Units      Ref Range   Creatinine              180              mg/dL      >=20 ==================================================================== Declared Medications:  The  flagging and interpretation on this report are based on the  following declared medications.  Unexpected results may arise from  inaccuracies in the declared medications.  **Note: The testing scope of this panel does not include following  reported medications:  Atenolol  Atorvastatin  Cholecalciferol  Clopidogrel  Iron (Ferrous Sulfate)  Methocarbamol  Polyethylene Glycol  Prednisone ==================================================================== For clinical consultation, please call [phone]. ====================================================================    Note: Above Lab results reviewed.  Recent imaging  DG Chest 2 View CLINICAL DATA:  85 year old female with right lower lobe infection last month. Continued productive cough with low-grade fever.  EXAM: CHEST - 2 VIEW  COMPARISON:  Chest radiographs 05/20/2018 and earlier.  FINDINGS: Chronic pulmonary hyperinflation. Mediastinal contours are stable since 2016 and within normal  limits. Calcified aortic atherosclerosis. Visualized tracheal air column is within normal limits. No pneumothorax, pulmonary edema, pleural effusion or consolidation. Patchy right lower lobe opacity seen in November has resolved. Osteopenia. No acute osseous abnormality identified.  IMPRESSION: Chronic lung disease with pulmonary hyperinflation. Resolved right lower lobe opacity and small pleural effusions since November. No acute cardiopulmonary abnormality.  Electronically Signed   By: Genevie Ann M.D.   On: 07/01/2018 07:52  Assessment  The primary encounter diagnosis was Chronic pain syndrome. Diagnoses of Chronic lower extremity pain (Primary Area of Pain) (Right), Chronic low back pain (Secondary area of Pain) (Bilateral) w/ sciatica (Bilateral) (R>L), Lumbar foraminal stenosis (Right) (Severe at L4-5), Chronic musculoskeletal pain, Pharmacologic therapy, Intractable vomiting with nausea, unspecified vomiting type, and Protein-calorie malnutrition, severe were also pertinent to this visit.  Plan of Care  I have discontinued Tasha Reyes's HYDROcodone-acetaminophen, HYDROcodone-acetaminophen, gabapentin, ondansetron, HYDROcodone-acetaminophen, and HYDROcodone-acetaminophen. I have also changed her HYDROcodone-acetaminophen. Additionally, I am having her start on ondansetron, HYDROcodone-acetaminophen, and HYDROcodone-acetaminophen. Lastly, I am having her maintain her OXYGEN, FeroSul, clopidogrel, atenolol, mirtazapine, albuterol, pantoprazole, furosemide, atorvastatin, Vitamin D3, and methocarbamol.  Pharmacotherapy (Medications Ordered): Meds ordered this encounter  Medications  . HYDROcodone-acetaminophen (NORCO/VICODIN) 5-325 MG tablet    Sig: Take 1 tablet by mouth every 8 (eight) hours as needed for up to 30 days for severe pain. Must last 30 days    Dispense:  90 tablet    Refill:  0    Chronic Pain: STOP Act (Not applicable) Fill 1 day early if closed on refill date. Do  not fill until: 01/24/2019. To last until: 02/23/2019. Avoid benzodiazepines within 8 hours of opioids  . methocarbamol (ROBAXIN) 750 MG tablet    Sig: Take 1 tablet (750 mg total) by mouth 3 (three) times daily as needed for muscle spasms.    Dispense:  90 tablet    Refill:  2    Fill one day early if pharmacy is closed on scheduled refill date. May substitute for generic if available.  . ondansetron (ZOFRAN-ODT) 8 MG disintegrating tablet    Sig: Take 1 tablet (8 mg total) by mouth 2 (two) times daily for 15 days.    Dispense:  30 tablet    Refill:  0    Do not add to the electronic "Automatic Refill" notification system. Patient may have prescription filled one day early if pharmacy is closed on scheduled refill date.  Marland Kitchen HYDROcodone-acetaminophen (NORCO/VICODIN) 5-325 MG tablet    Sig: Take 1 tablet by mouth every 8 (eight) hours as needed for up to 30 days for severe pain. Must last 30 days    Dispense:  90 tablet    Refill:  0    Chronic Pain:  STOP Act (Not applicable) Fill 1 day early if closed on refill date. Do not fill until: 02/23/2019. To last until: 03/25/2019. Avoid benzodiazepines within 8 hours of opioids  . HYDROcodone-acetaminophen (NORCO/VICODIN) 5-325 MG tablet    Sig: Take 1 tablet by mouth every 8 (eight) hours as needed for up to 30 days for severe pain. Must last 30 days    Dispense:  90 tablet    Refill:  0    Chronic Pain: STOP Act (Not applicable) Fill 1 day early if closed on refill date. Do not fill until: 03/25/2019. To last until: 04/24/2019. Avoid benzodiazepines within 8 hours of opioids   Orders:  Orders Placed This Encounter  Procedures  . Comp. Metabolic Panel (12)    With GFR. Indications: Chronic Pain Syndrome (G89.4) & Pharmacotherapy (H88.502)    Order Specific Question:   Has the patient fasted?    Answer:   No    Order Specific Question:   CC Results    Answer:   PCP-NURSE [[id number]]   Follow-up plan:   Return in about 3 months (around 04/13/2019)  for (VV), E/M (MM).    Recent Visits Date Type Provider Dept  10/13/18 Office Visit Vevelyn Francois, NP [facility]  Showing recent visits within past 90 days and meeting all other requirements   Today's Visits Date Type Provider Dept  01/11/19 Office Visit Milinda Pointer, MD [facility]  Showing today's visits and meeting all other requirements   Future Appointments No visits were found meeting these conditions.  Showing future appointments within next 90 days and meeting all other requirements   I discussed the assessment and treatment plan with the patient. The patient was provided an opportunity to ask questions and all were answered. The patient agreed with the plan and demonstrated an understanding of the instructions.  Patient advised to call back or seek an in-person evaluation if the symptoms or condition worsens.  Total duration of non-face-to-face encounter: 12 minutes.  Note by: Gaspar Cola, MD Date: 01/11/2019; Time: 1:51 PM  Note: This dictation was prepared with Dragon dictation. Any transcriptional errors that may result from this process are unintentional.  Disclaimer:  * Given the special circumstances of the COVID-19 pandemic, the federal government has announced that the Office for Civil Rights (OCR) will exercise its enforcement discretion and will not impose penalties on physicians using telehealth in the event of noncompliance with regulatory requirements under the [place] (HIPAA) in connection with the good faith provision of telehealth during the NOMVE-72 national public health emergency. ([place])

## 2019-01-11 NOTE — Patient Instructions (Signed)
____________________________________________________________________________________________  Medication Recommendations and Reminders  Applies to: All patients receiving prescriptions (written and/or electronic).  Medication Rules & Regulations: These rules and regulations exist for your safety and that of others. They are not flexible and neither are we. Dismissing or ignoring them will be considered "non-compliance" with medication therapy, resulting in complete and irreversible termination of such therapy. (See document titled "Medication Rules" for more details.) In all conscience, because of safety reasons, we cannot continue providing a therapy where the patient does not follow instructions.  Pharmacy of record:   Definition: This is the pharmacy where your electronic prescriptions will be sent.   We do not endorse any particular pharmacy.  You are not restricted in your choice of pharmacy.  The pharmacy listed in the electronic medical record should be the one where you want electronic prescriptions to be sent.  If you choose to change pharmacy, simply notify our nursing staff of your choice of new pharmacy.  Recommendations:  Keep all of your pain medications in a safe place, under lock and key, even if you live alone.   After you fill your prescription, take 1 week's worth of pills and put them away in a safe place. You should keep a separate, properly labeled bottle for this purpose. The remainder should be kept in the original bottle. Use this as your primary supply, until it runs out. Once it's gone, then you know that you have 1 week's worth of medicine, and it is time to come in for a prescription refill. If you do this correctly, it is unlikely that you will ever run out of medicine.  To make sure that the above recommendation works, it is very important that you make sure your medication refill appointments are scheduled at least 1 week before you run out of medicine. To do  this in an effective manner, make sure that you do not leave the office without scheduling your next medication management appointment. Always ask the nursing staff to show you in your prescription , when your medication will be running out. Then arrange for the receptionist to get you a return appointment, at least 7 days before you run out of medicine. Do not wait until you have 1 or 2 pills left, to come in. This is very poor planning and does not take into consideration that we may need to cancel appointments due to bad weather, sickness, or emergencies affecting our staff.  "Partial Fill": If for any reason your pharmacy does not have enough pills/tablets to completely fill or refill your prescription, do not allow for a "partial fill". You will need a separate prescription to fill the remaining amount, which we will not provide. If the reason for the partial fill is your insurance, you will need to talk to the pharmacist about payment alternatives for the remaining tablets, but again, do not accept a partial fill.  Prescription refills and/or changes in medication(s):   Prescription refills, and/or changes in dose or medication, will be conducted only during scheduled medication management appointments. (Applies to both, written and electronic prescriptions.)  No refills on procedure days. No medication will be changed or started on procedure days. No changes, adjustments, and/or refills will be conducted on a procedure day. Doing so will interfere with the diagnostic portion of the procedure.  No phone refills. No medications will be "called into the pharmacy".  No Fax refills.  No weekend refills.  No Holliday refills.  No after hours refills.  Remember:  Business   hours are:  Monday to Thursday 8:00 AM to 4:00 PM Provider's Schedule: Crystal King, NP - Appointments are:  Medication management: Monday to Thursday 8:00 AM to 4:00 PM Mckala Pantaleon, MD - Appointments are:   Medication management: Monday and Wednesday 8:00 AM to 4:00 PM Procedure day: Tuesday and Thursday 7:30 AM to 4:00 PM Bilal Lateef, MD - Appointments are:  Medication management: Tuesday and Thursday 8:00 AM to 4:00 PM Procedure day: Monday and Wednesday 7:30 AM to 4:00 PM (Last update: 09/11/2017) ____________________________________________________________________________________________    

## 2019-01-12 ENCOUNTER — Encounter: Payer: Medicare Other | Admitting: Nurse Practitioner

## 2019-01-21 ENCOUNTER — Other Ambulatory Visit: Payer: Self-pay | Admitting: Internal Medicine

## 2019-01-21 DIAGNOSIS — I10 Essential (primary) hypertension: Secondary | ICD-10-CM

## 2019-01-27 IMAGING — CR DG CHEST 2V
2 series · 2 of 2 positions shown · non-contrast
Comparison: Chest radiographs 05/20/2018 and earlier.

CLINICAL DATA: 87-year-old female with right lower lobe infection
last month. Continued productive cough with low-grade fever.

EXAM:
CHEST - 2 VIEW

[chest pa]
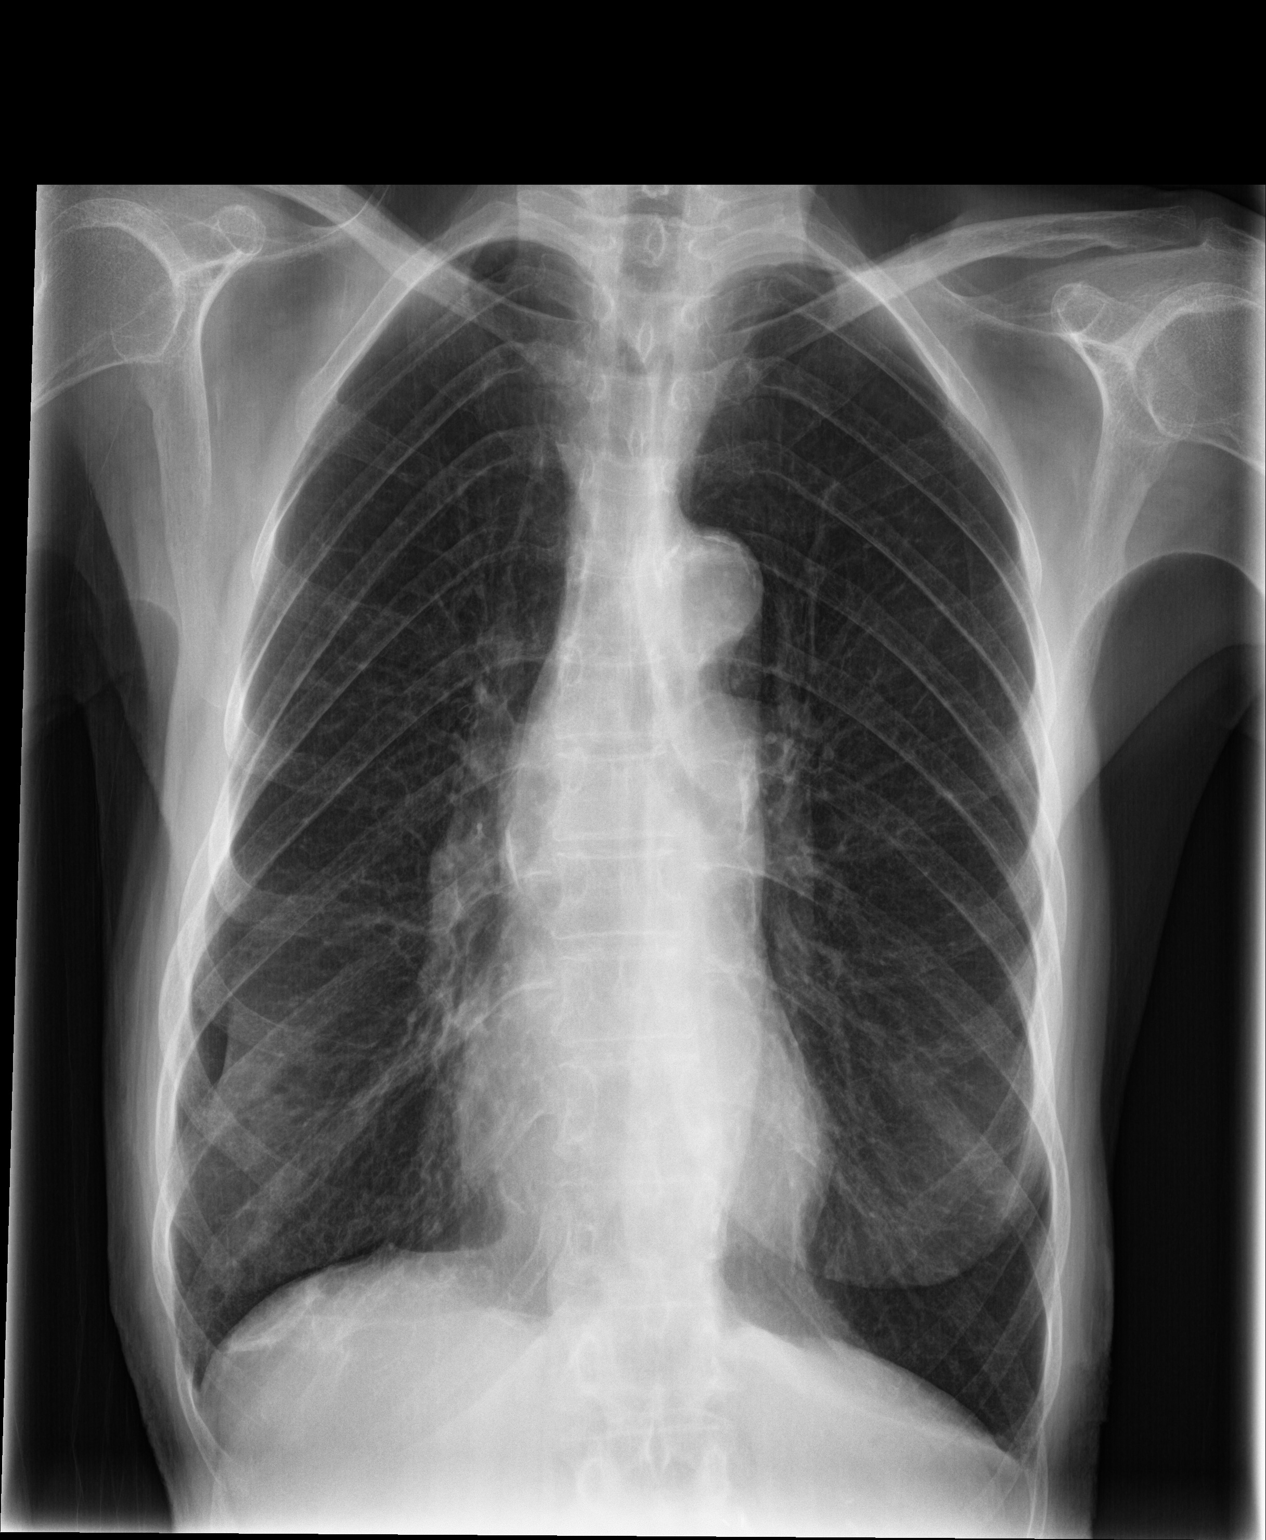

[chest lat]
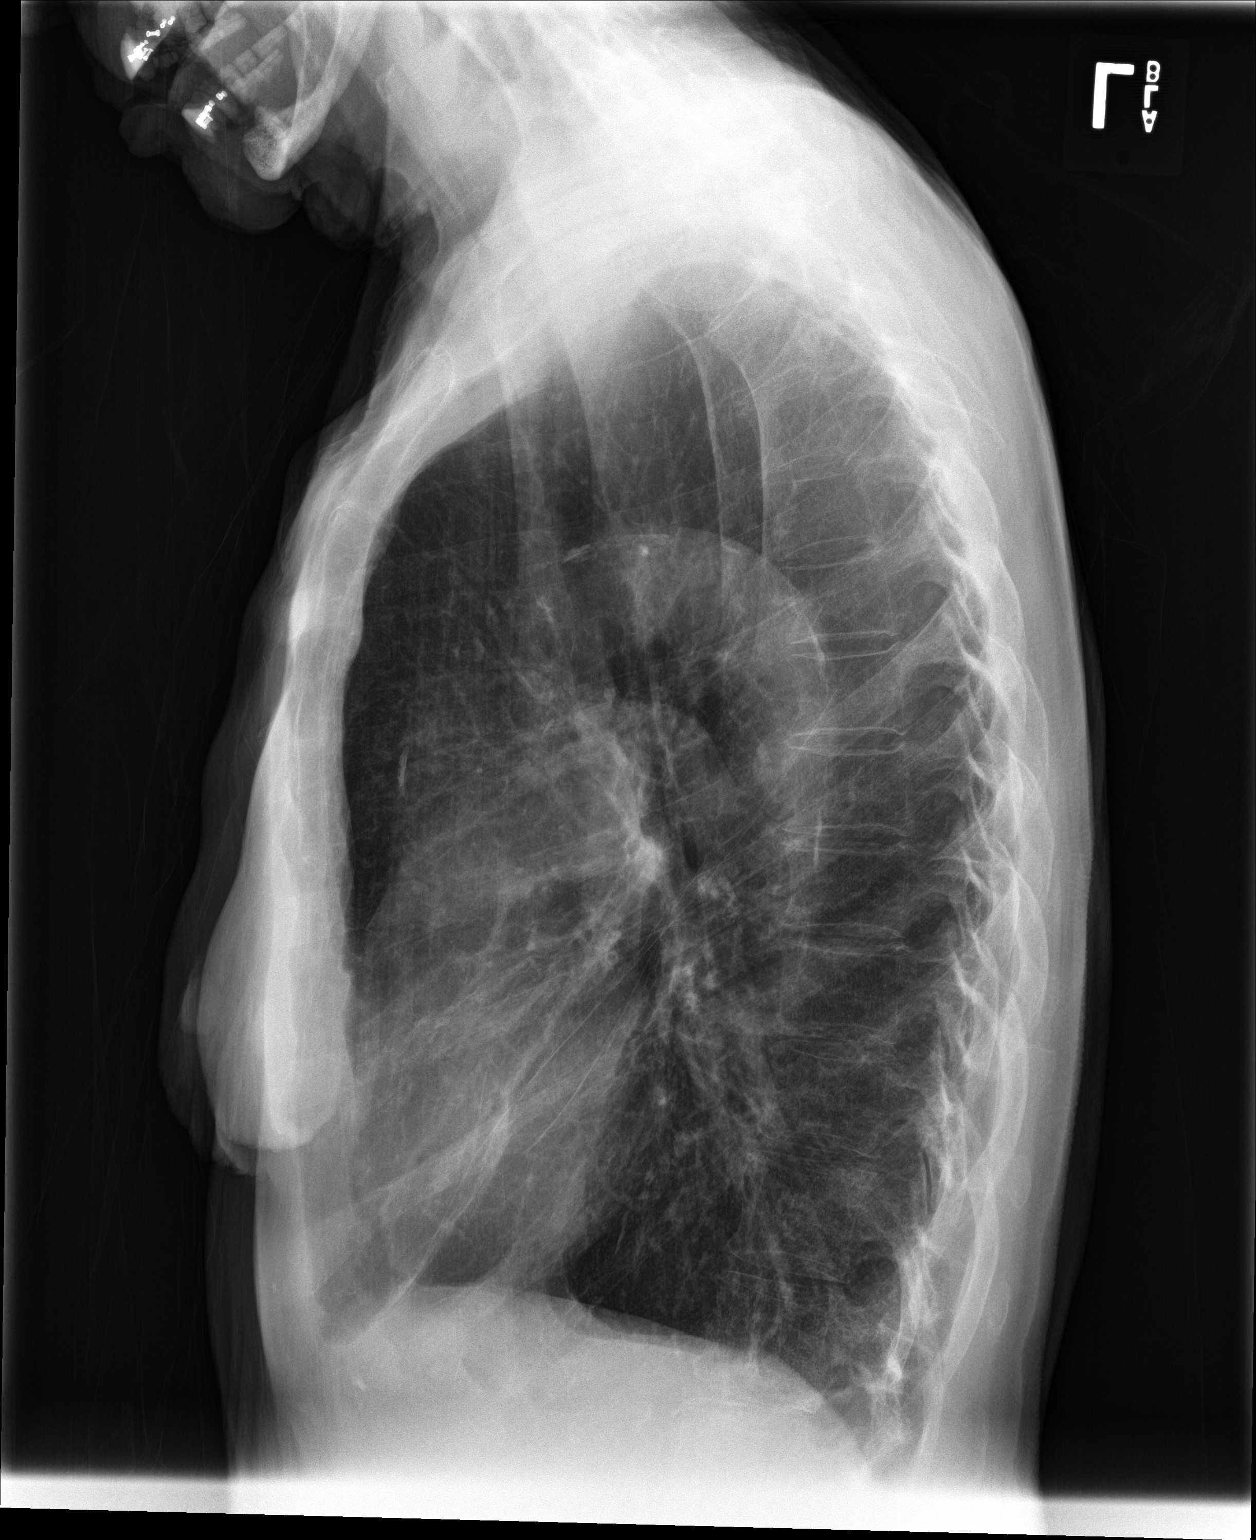

[2 of 2 positions shown; findings below may reference images not displayed]

FINDINGS: Chronic pulmonary hyperinflation. Mediastinal contours are stable
since 1856 and within normal limits. Calcified aortic
atherosclerosis. Visualized tracheal air column is within normal
limits. No pneumothorax, pulmonary edema, pleural effusion or
consolidation. Patchy right lower lobe opacity seen in [REDACTED] has
resolved. Osteopenia. No acute osseous abnormality identified.
IMPRESSION: Chronic lung disease with pulmonary hyperinflation. Resolved right
lower lobe opacity and small pleural effusions since [REDACTED]. No
acute cardiopulmonary abnormality.

## 2019-02-17 ENCOUNTER — Other Ambulatory Visit: Payer: Self-pay | Admitting: Internal Medicine

## 2019-02-21 ENCOUNTER — Other Ambulatory Visit: Payer: Self-pay | Admitting: Internal Medicine

## 2019-02-21 DIAGNOSIS — J449 Chronic obstructive pulmonary disease, unspecified: Secondary | ICD-10-CM

## 2019-03-13 ENCOUNTER — Other Ambulatory Visit: Payer: Self-pay | Admitting: Internal Medicine

## 2019-03-13 DIAGNOSIS — K219 Gastro-esophageal reflux disease without esophagitis: Secondary | ICD-10-CM

## 2019-04-01 ENCOUNTER — Other Ambulatory Visit: Payer: Self-pay | Admitting: Internal Medicine

## 2019-04-01 DIAGNOSIS — J449 Chronic obstructive pulmonary disease, unspecified: Secondary | ICD-10-CM

## 2019-04-07 ENCOUNTER — Ambulatory Visit: Payer: Medicare Other | Attending: Pain Medicine | Admitting: Pain Medicine

## 2019-04-07 ENCOUNTER — Encounter: Payer: Self-pay | Admitting: Pain Medicine

## 2019-04-07 ENCOUNTER — Other Ambulatory Visit: Payer: Self-pay

## 2019-04-07 ENCOUNTER — Telehealth: Payer: Self-pay

## 2019-04-07 DIAGNOSIS — M7918 Myalgia, other site: Secondary | ICD-10-CM

## 2019-04-07 DIAGNOSIS — M5442 Lumbago with sciatica, left side: Secondary | ICD-10-CM | POA: Diagnosis not present

## 2019-04-07 DIAGNOSIS — M5441 Lumbago with sciatica, right side: Secondary | ICD-10-CM

## 2019-04-07 DIAGNOSIS — G8929 Other chronic pain: Secondary | ICD-10-CM | POA: Diagnosis not present

## 2019-04-07 DIAGNOSIS — M79604 Pain in right leg: Secondary | ICD-10-CM

## 2019-04-07 DIAGNOSIS — G894 Chronic pain syndrome: Secondary | ICD-10-CM | POA: Diagnosis not present

## 2019-04-07 MED ORDER — HYDROCODONE-ACETAMINOPHEN 5-325 MG PO TABS: 1.0000 | ORAL_TABLET | Freq: Three times a day (TID) | ORAL | 0 refills | Status: DC | PRN

## 2019-04-07 MED ORDER — METHOCARBAMOL 750 MG PO TABS: 750.0000 mg | ORAL_TABLET | Freq: Three times a day (TID) | ORAL | 2 refills | Status: DC | PRN

## 2019-04-07 NOTE — Telephone Encounter (Signed)
She forgot to tell Dr. Dossie Arbour in her virtual visit today that she needs medicine for nausea

## 2019-04-07 NOTE — Progress Notes (Signed)
Pain Management Virtual Encounter Note - Virtual Visit via Telephone Telehealth (real-time audio visits between healthcare provider and patient).   Patient's Phone No. & Preferred Pharmacy:  [phone] (home); There is no such number on file (mobile).; (Preferred) [phone] No e-mail address on record  [facility], [place] [address] AT [place] [address] Phone: [phone] Fax: [phone]    Pre-screening note:  Our staff contacted Ms. Tasha Reyes and offered her an "in person", "face-to-face" appointment versus a telephone encounter. She indicated preferring the telephone encounter, at this time.   Reason for Virtual Visit: COVID-19*  Social distancing based on CDC and AMA recommendations.   I contacted Tasha Reyes on 04/07/2019 via telephone.      I clearly identified myself as Gaspar Cola, MD. I verified that I was speaking with the correct person using two identifiers (Name: Tasha Reyes, and date of birth: 1930-12-19).  Advanced Informed Consent I sought verbal advanced consent from Tasha Reyes for virtual visit interactions. I informed Tasha Reyes of possible security and privacy concerns, risks, and limitations associated with providing "not-in-person" medical evaluation and management services. I also informed Tasha Reyes of the availability of "in-person" appointments. Finally, I informed her that there would be a charge for the virtual visit and that she could be  personally, fully or partially, financially responsible for it. Tasha Reyes expressed understanding and agreed to proceed.   Historic Elements   Tasha Reyes is a 85 y.o. year old, female patient evaluated today after her last encounter by our practice on 01/11/2019. Tasha Reyes  has a past medical history of Acute on chronic respiratory failure ([place]) (05/20/2018), Aneurysm of iliac artery ([place])  (05/11/2015), CHF (congestive heart failure) ([place]), COPD (chronic obstructive pulmonary disease) ([place]), Cystitis, History of DVT (deep vein thrombosis) (05/11/2015), History of peptic ulcer disease (05/11/2015), [name], Hypertension, Insomnia, Losing weight, Migraines, [name], Rectal bleeding, Stroke ([place]), [name]; [name]; [name], 2008); Back surgery; Colonoscopy (2012); Esophagogastroduodenoscopy (04/2013); Abdominal hysterectomy; Appendectomy; blood clot ; Hip Arthroplasty (Right, 06/17/2017); and Joint replacement (Right, 06/19/2017). Tasha Reyes has a current medication list which includes the following prescription(s): albuterol, atenolol, atorvastatin, vitamin d3, clopidogrel, ferosul, furosemide, mirtazapine, oxygen-helium, pantoprazole, hydrocodone-acetaminophen, hydrocodone-acetaminophen, hydrocodone-acetaminophen, methocarbamol, and ondansetron. She  reports that she has been smoking cigarettes. She has a 15.25 pack-year smoking history. She has never used smokeless tobacco. She reports that she does not drink alcohol or use drugs. Tasha Reyes has No Known Allergies.   HPI  Today, she is being contacted for medication management.  The patient indicates doing well with the current medication regimen. No adverse reactions or side effects reported to the medications.   Pharmacotherapy Assessment  Analgesic: Hydrocodone/APAP 5/325 mg, 1 tab PO q 8 hrs (15 mg/day of hydrocodone) MME/day:15 mg/day.   Monitoring: Pharmacotherapy: No side-effects or adverse reactions reported. [place] PMP: PDMP reviewed during this encounter.       Compliance: No problems identified. Effectiveness: Clinically acceptable. Plan: Refer to "POC".  UDS:  Summary  Date Value Ref Range Status  10/21/2017 FINAL  Final     Comment:    ==================================================================== TOXASSURE SELECT 13 (MW) ==================================================================== Test  Result       Flag       Units Drug Present not Declared for Prescription Verification   Hydrocodone                    1014         UNEXPECTED ng/mg creat   Hydromorphone                  211          UNEXPECTED ng/mg creat   Dihydrocodeine                 221          UNEXPECTED ng/mg creat   Norhydrocodone                 1450         UNEXPECTED ng/mg creat    Sources of hydrocodone include scheduled prescription    medications. Hydromorphone, dihydrocodeine and norhydrocodone are    expected metabolites of hydrocodone. Hydromorphone and    dihydrocodeine are also available as scheduled prescription    medications. ==================================================================== Test                      Result    Flag   Units      Ref Range   Creatinine              180              mg/dL      >=20 ==================================================================== Declared Medications:  The flagging and interpretation on this report are based on the  following declared medications.  Unexpected results may arise from  inaccuracies in the declared medications.  **Note: The testing scope of this panel does not include following  reported medications:  Atenolol  Atorvastatin  Cholecalciferol  Clopidogrel  Iron (Ferrous Sulfate)  Methocarbamol  Polyethylene Glycol  Prednisone ==================================================================== For clinical consultation, please call [phone]. ====================================================================    Laboratory Chemistry Profile (12 mo)  Renal: 05/23/2018: BUN 29; Creatinine, Ser 1.03  Lab Results  Component Value Date   GFRAA 56 (L) 05/23/2018   GFRNONAA 48 (L) 05/23/2018   Hepatic:  05/20/2018: Albumin 3.7 Lab Results  Component Value Date   AST 24 05/20/2018   ALT 19 05/20/2018   Other: No results found for requested labs within last 8760 hours. Note: Above Lab results reviewed.  Imaging  Last 90 days:  No results found.  Assessment  The primary encounter diagnosis was Chronic pain syndrome. Diagnoses of Chronic lower extremity pain (Primary Area of Pain) (Right), Chronic low back pain (Secondary area of Pain) (Bilateral) w/ sciatica (Bilateral) (R>L), and Chronic musculoskeletal pain were also pertinent to this visit.  Plan of Care  I have discontinued Olukemi F. Buckalew's HYDROcodone-acetaminophen and HYDROcodone-acetaminophen. I have also changed her HYDROcodone-acetaminophen. Additionally, I am having her start on HYDROcodone-acetaminophen and HYDROcodone-acetaminophen. Lastly, I am having her maintain her OXYGEN, FeroSul, clopidogrel, furosemide, Vitamin D3, ondansetron, atenolol, mirtazapine, pantoprazole, atorvastatin, albuterol, and methocarbamol.  Pharmacotherapy (Medications Ordered): Meds ordered this encounter  Medications  . methocarbamol (ROBAXIN) 750 MG tablet    Sig: Take 1 tablet (750 mg total) by mouth 3 (three) times daily as needed for muscle spasms.    Dispense:  90 tablet    Refill:  2    Fill one day early if pharmacy is closed on scheduled refill date. May substitute for generic if available.  Tasha Reyes Kitchen HYDROcodone-acetaminophen (NORCO/VICODIN)  5-325 MG tablet    Sig: Take 1 tablet by mouth every 8 (eight) hours as needed for severe pain. Must last 30 days    Dispense:  90 tablet    Refill:  0    Chronic Pain: STOP Act (Not applicable) Fill 1 day early if closed on refill date. Do not fill until: 04/24/2019. To last until: 05/24/2019. Avoid benzodiazepines within 8 hours of opioids  . HYDROcodone-acetaminophen (NORCO/VICODIN) 5-325 MG tablet    Sig: Take 1 tablet by mouth every 8 (eight) hours as needed for severe pain. Must last 30 days     Dispense:  90 tablet    Refill:  0    Chronic Pain: STOP Act (Not applicable) Fill 1 day early if closed on refill date. Do not fill until: 05/24/2019. To last until: 06/23/2019. Avoid benzodiazepines within 8 hours of opioids  . HYDROcodone-acetaminophen (NORCO/VICODIN) 5-325 MG tablet    Sig: Take 1 tablet by mouth every 8 (eight) hours as needed for severe pain. Must last 30 days    Dispense:  90 tablet    Refill:  0    Chronic Pain: STOP Act (Not applicable) Fill 1 day early if closed on refill date. Do not fill until: 06/23/2019. To last until: 07/23/2019. Avoid benzodiazepines within 8 hours of opioids   Orders:  No orders of the defined types were placed in this encounter.  Follow-up plan:   Return in about 15 weeks (around 07/21/2019) for (VV), (MM).      Interventional therapies: Planned, scheduled, and/or pending:   Not at this time.   Considering:   NOTE: PLAVIX ANTICOAGULATION (Stop: 7-10 days  Restart: 2 hors) Diagnostic right L4 TFESI  Diagnostic right L4-5 LESI  Diagnostic bilateral lumbar facet block  Possible bilateral lumbar facet RFA    Palliative PRN treatment(s):   Diagnostic right L4 TFESI Diagnostic right L4-5 LESI  Diagnostic bilateral lumbar facet block     Recent Visits Date Type Provider Dept  01/11/19 Office Visit Milinda Pointer, MD [facility]  Showing recent visits within past 90 days and meeting all other requirements   Today's Visits Date Type Provider Dept  04/07/19 Office Visit Milinda Pointer, MD [facility]  Showing today's visits and meeting all other requirements   Future Appointments No visits were found meeting these conditions.  Showing future appointments within next 90 days and meeting all other requirements   I discussed the assessment and treatment plan with the patient. The patient was provided an opportunity to ask questions and all were answered. The patient agreed with the plan and demonstrated an  understanding of the instructions.  Patient advised to call back or seek an in-person evaluation if the symptoms or condition worsens.  Total duration of non-face-to-face encounter: 12 minutes.  Note by: Gaspar Cola, MD Date: 04/07/2019; Time: 12:30 PM  Note: This dictation was prepared with Dragon dictation. Any transcriptional errors that may result from this process are unintentional.  Disclaimer:  * Given the special circumstances of the COVID-19 pandemic, the federal government has announced that the Office for Civil Rights (OCR) will exercise its enforcement discretion and will not impose penalties on physicians using telehealth in the event of noncompliance with regulatory requirements under the [place] and [place] (HIPAA) in connection with the good faith provision of telehealth during the TSVXB-93 national public health emergency. ([place])

## 2019-04-07 NOTE — Telephone Encounter (Signed)
I sent Dr. Dossie Arbour a note to refill.

## 2019-04-08 NOTE — Telephone Encounter (Signed)
Dr. Dossie Arbour has declined to refill this medication anymore and wants the PCP to write for it. Patient called and informed.

## 2019-04-12 ENCOUNTER — Encounter: Payer: Medicare Other | Admitting: Pain Medicine

## 2019-04-19 ENCOUNTER — Ambulatory Visit (INDEPENDENT_AMBULATORY_CARE_PROVIDER_SITE_OTHER): Payer: Medicare Other | Admitting: Nurse Practitioner

## 2019-04-19 ENCOUNTER — Other Ambulatory Visit (INDEPENDENT_AMBULATORY_CARE_PROVIDER_SITE_OTHER): Payer: Medicare Other

## 2019-04-19 ENCOUNTER — Encounter (INDEPENDENT_AMBULATORY_CARE_PROVIDER_SITE_OTHER): Payer: Medicare Other

## 2019-06-20 ENCOUNTER — Other Ambulatory Visit: Payer: Self-pay | Admitting: Internal Medicine

## 2019-06-20 ENCOUNTER — Other Ambulatory Visit (INDEPENDENT_AMBULATORY_CARE_PROVIDER_SITE_OTHER): Payer: Self-pay | Admitting: Vascular Surgery

## 2019-06-21 NOTE — Telephone Encounter (Signed)
Hmm, I'm not sure it's listed at being taken differently as 75 mg by mouth daily, which is essentially one tablet.  She's taking it correctly so it's approved.

## 2019-06-21 NOTE — Telephone Encounter (Signed)
Patient reported taking this medication different from sig. Please verify appropriate dosage.

## 2019-06-26 ENCOUNTER — Other Ambulatory Visit: Payer: Self-pay | Admitting: Internal Medicine

## 2019-06-26 DIAGNOSIS — J449 Chronic obstructive pulmonary disease, unspecified: Secondary | ICD-10-CM

## 2019-07-13 ENCOUNTER — Other Ambulatory Visit: Payer: Self-pay | Admitting: Internal Medicine

## 2019-07-13 DIAGNOSIS — I10 Essential (primary) hypertension: Secondary | ICD-10-CM

## 2019-07-13 DIAGNOSIS — J449 Chronic obstructive pulmonary disease, unspecified: Secondary | ICD-10-CM

## 2019-07-13 MED ORDER — ALBUTEROL SULFATE (2.5 MG/3ML) 0.083% IN NEBU: INHALATION_SOLUTION | RESPIRATORY_TRACT | 5 refills | Status: DC

## 2019-07-20 ENCOUNTER — Encounter: Payer: Self-pay | Admitting: Pain Medicine

## 2019-07-20 NOTE — Progress Notes (Signed)
Virtual Encounter - Pain Management PROVIDER NOTE: Information contained herein reflects review and annotations entered in association with encounter. Interpretation of such information and data should be left to medically-trained personnel. Information provided to patient can be located elsewhere in the medical record under "Patient Instructions". Document created using STT-dictation technology, any transcriptional errors that may result from process are unintentional.    Contact & Pharmacy Preferred: [phone] Home: [phone] (home) Mobile: There is no such number on file (mobile). E-mail: No e-mail address on record  [facility], [place] [address] AT [place] [address] Phone: [phone] Fax: [phone]   Pre-screening  Tasha Reyes offered "in-person" vs "virtual" encounter. She indicated preferring virtual for this encounter.   Reason COVID-19*  Social distancing based on CDC and AMA recommendations.   I contacted SHANAH GUIMARAES on 07/21/2019 via telephone.      I clearly identified myself as Gaspar Cola, MD. I verified that I was speaking with the correct person using two identifiers (Name: Tasha Reyes, and date of birth: [birth year]).  Consent I sought verbal advanced consent from Tasha Reyes for virtual visit interactions. I informed Tasha Reyes of possible security and privacy concerns, risks, and limitations associated with providing "not-in-person" medical evaluation and management services. I also informed Tasha Reyes of the availability of "in-person" appointments. Finally, I informed her that there would be a charge for the virtual visit and that she could be  personally, fully or partially, financially responsible for it. Tasha Reyes expressed understanding and agreed to proceed.   Historic Elements   Tasha Reyes is a 86 y.o. year old, female patient  evaluated today after her last encounter by our practice on 04/07/2019. Tasha Reyes  has a past medical history of Acute on chronic respiratory failure ([place]) (05/20/2018), Aneurysm of iliac artery ([place]) (05/11/2015), CHF (congestive heart failure) ([place]), COPD (chronic obstructive pulmonary disease) ([place]), Cystitis, History of DVT (deep vein thrombosis) (05/11/2015), History of peptic ulcer disease (05/11/2015), [name], Hypertension, Insomnia, Losing weight, Migraines, [name], Rectal bleeding, Stroke ([place]), [name]; [name]; [name], 2008); Back surgery; Colonoscopy (2012); Esophagogastroduodenoscopy (04/2013); Abdominal hysterectomy; Appendectomy; blood clot ; Hip Arthroplasty (Right, 06/17/2017); and Joint replacement (Right, 06/19/2017). Tasha Reyes has a current medication list which includes the following prescription(s): albuterol, atenolol, atorvastatin, vitamin d3, clopidogrel, ferosul, furosemide, [START ON 07/23/2019] hydrocodone-acetaminophen, [START ON 08/22/2019] hydrocodone-acetaminophen, [START ON 09/21/2019] hydrocodone-acetaminophen, [START ON 07/23/2019] methocarbamol, mirtazapine, oxygen-helium, pantoprazole, and ondansetron. She  reports that she has been smoking cigarettes. She has a 15.25 pack-year smoking history. She has never used smokeless tobacco. She reports that she does not drink alcohol or use drugs. Tasha Reyes has No Known Allergies.   HPI  Today, she is being contacted for medication management.  The patient indicates doing well with the current medication regimen. No adverse reactions or side effects reported to the medications.   Pharmacotherapy Assessment  Analgesic: Hydrocodone/APAP 5/325 mg, 1 tab PO q 8 hrs (15 mg/day of hydrocodone) MME/day:15 mg/day.    Monitoring: Pharmacotherapy: No side-effects or adverse reactions reported. [place] PMP: PDMP reviewed during this encounter.       Compliance: No problems identified. Effectiveness: Clinically acceptable. Plan: Refer to "POC".  UDS:  Summary  Date Value Ref Range Status  10/21/2017 FINAL  Final    Comment:    ==================================================================== TOXASSURE SELECT 13 (MW) ==================================================================== Test                             Result       Flag       Units Drug Present not Declared for Prescription Verification   Hydrocodone                    1014         UNEXPECTED ng/mg creat   Hydromorphone                  211          UNEXPECTED ng/mg creat   Dihydrocodeine                 221          UNEXPECTED ng/mg creat   Norhydrocodone                 1450         UNEXPECTED ng/mg creat    Sources of hydrocodone include scheduled prescription    medications. Hydromorphone, dihydrocodeine and norhydrocodone are    expected metabolites of hydrocodone. Hydromorphone and    dihydrocodeine are also available as scheduled prescription    medications. ==================================================================== Test                      Result    Flag   Units      Ref Range   Creatinine              180              mg/dL      >=20 ==================================================================== Declared Medications:  The flagging and interpretation on this report are based on the  following declared medications.  Unexpected results may arise from  inaccuracies in the declared medications.  **Note: The testing scope of this panel does not include following  reported medications:  Atenolol  Atorvastatin  Cholecalciferol  Clopidogrel  Iron (Ferrous Sulfate)  Methocarbamol  Polyethylene Glycol  Prednisone ==================================================================== For clinical consultation,  please call [phone]. ====================================================================    Laboratory Chemistry Profile (12 mo)  Renal: No results found for requested labs within last 8760 hours.  Lab Results  Component Value Date   GFRAA 56 (L) 05/23/2018   GFRNONAA 48 (L) 05/23/2018   Hepatic: No results found for requested labs within last 8760 hours. Lab Results  Component Value Date   AST 24 05/20/2018   ALT 19 05/20/2018   Other: No results found for requested labs within last 8760 hours. Note: Above Lab results reviewed.  Imaging  DG Chest 2 View CLINICAL DATA:  86 year old female with right lower lobe infection last month. Continued productive cough with low-grade fever.  EXAM: CHEST - 2 VIEW  COMPARISON:  Chest radiographs 05/20/2018 and earlier.  FINDINGS: Chronic pulmonary hyperinflation. Mediastinal contours are stable since 2016 and within normal limits. Calcified aortic atherosclerosis. Visualized tracheal air column is within normal limits. No pneumothorax, pulmonary edema, pleural effusion or consolidation. Patchy right lower lobe opacity seen in November has resolved. Osteopenia. No acute osseous abnormality identified.  IMPRESSION: Chronic lung disease with pulmonary hyperinflation. Resolved right lower lobe opacity and small pleural effusions since November. No acute cardiopulmonary abnormality.  Electronically Signed   By: Genevie Ann M.D.   On:  07/01/2018 07:52   Assessment  The primary encounter diagnosis was Chronic pain syndrome. Diagnoses of Chronic lower extremity pain (Primary Area of Pain) (Right), Chronic low back pain (Secondary area of Pain) (Bilateral) w/ sciatica (Bilateral) (R>L), and Chronic musculoskeletal pain were also pertinent to this visit.  Plan of Care  Problem-specific:  No problem-specific Assessment & Plan notes found for this encounter.  I am having Tasha Reyes maintain her OXYGEN, FeroSul, furosemide,  Vitamin D3, ondansetron, mirtazapine, pantoprazole, clopidogrel, atorvastatin, albuterol, atenolol, methocarbamol, HYDROcodone-acetaminophen, HYDROcodone-acetaminophen, and HYDROcodone-acetaminophen.  Pharmacotherapy (Medications Ordered): Meds ordered this encounter  Medications  . methocarbamol (ROBAXIN) 750 MG tablet    Sig: Take 1 tablet (750 mg total) by mouth 3 (three) times daily as needed for muscle spasms.    Dispense:  90 tablet    Refill:  2    Fill one day early if pharmacy is closed on scheduled refill date. May substitute for generic if available.  Marland Kitchen HYDROcodone-acetaminophen (NORCO/VICODIN) 5-325 MG tablet    Sig: Take 1 tablet by mouth every 8 (eight) hours as needed for severe pain. Must last 30 days    Dispense:  90 tablet    Refill:  0    Chronic Pain: STOP Act (Not applicable) Fill 1 day early if closed on refill date. Do not fill until: 07/23/2019. To last until: 08/22/2019. Avoid benzodiazepines within 8 hours of opioids  . HYDROcodone-acetaminophen (NORCO/VICODIN) 5-325 MG tablet    Sig: Take 1 tablet by mouth every 8 (eight) hours as needed for severe pain. Must last 30 days    Dispense:  90 tablet    Refill:  0    Chronic Pain: STOP Act (Not applicable) Fill 1 day early if closed on refill date. Do not fill until: 08/22/2019. To last until: 09/21/2019. Avoid benzodiazepines within 8 hours of opioids  . HYDROcodone-acetaminophen (NORCO/VICODIN) 5-325 MG tablet    Sig: Take 1 tablet by mouth every 8 (eight) hours as needed for severe pain. Must last 30 days    Dispense:  90 tablet    Refill:  0    Chronic Pain: STOP Act (Not applicable) Fill 1 day early if closed on refill date. Do not fill until: 09/21/2019. To last until: 10/21/2019. Avoid benzodiazepines within 8 hours of opioids   Orders:  No orders of the defined types were placed in this encounter.  Follow-up plan:   Return in about 13 weeks (around 10/20/2019) for (VV), (MM).      Interventional therapies: Planned,  scheduled, and/or pending:   Not at this time.   Considering:   NOTE: PLAVIX ANTICOAGULATION (Stop: 7-10 days  Restart: 2 hors) Diagnostic right L4 TFESI  Diagnostic right L4-5 LESI  Diagnostic bilateral lumbar facet block  Possible bilateral lumbar facet RFA    Palliative PRN treatment(s):   Diagnostic right L4 TFESI Diagnostic right L4-5 LESI  Diagnostic bilateral lumbar facet block     Recent Visits No visits were found meeting these conditions.  Showing recent visits within past 90 days and meeting all other requirements   Today's Visits Date Type Provider Dept  07/21/19 Telemedicine Milinda Pointer, MD [facility]  Showing today's visits and meeting all other requirements   Future Appointments No visits were found meeting these conditions.  Showing future appointments within next 90 days and meeting all other requirements   I discussed the assessment and treatment plan with the patient. The patient was provided an opportunity to ask questions and all were answered. The  patient agreed with the plan and demonstrated an understanding of the instructions.  Patient advised to call back or seek an in-person evaluation if the symptoms or condition worsens.  Total duration of non-face-to-face encounter: 13 minutes.  Note by: Gaspar Cola, MD Date: 07/21/2019; Time: 11:52 AM

## 2019-07-21 ENCOUNTER — Ambulatory Visit: Payer: Medicare Other | Attending: Pain Medicine | Admitting: Pain Medicine

## 2019-07-21 ENCOUNTER — Other Ambulatory Visit: Payer: Self-pay

## 2019-07-21 DIAGNOSIS — M5442 Lumbago with sciatica, left side: Secondary | ICD-10-CM

## 2019-07-21 DIAGNOSIS — G8929 Other chronic pain: Secondary | ICD-10-CM | POA: Diagnosis not present

## 2019-07-21 DIAGNOSIS — M79604 Pain in right leg: Secondary | ICD-10-CM

## 2019-07-21 DIAGNOSIS — G894 Chronic pain syndrome: Secondary | ICD-10-CM | POA: Diagnosis not present

## 2019-07-21 DIAGNOSIS — M5441 Lumbago with sciatica, right side: Secondary | ICD-10-CM | POA: Diagnosis not present

## 2019-07-21 DIAGNOSIS — M7918 Myalgia, other site: Secondary | ICD-10-CM

## 2019-07-21 MED ORDER — HYDROCODONE-ACETAMINOPHEN 5-325 MG PO TABS: 1.0000 | ORAL_TABLET | Freq: Three times a day (TID) | ORAL | 0 refills | Status: DC | PRN

## 2019-07-21 MED ORDER — METHOCARBAMOL 750 MG PO TABS: 750.0000 mg | ORAL_TABLET | Freq: Three times a day (TID) | ORAL | 2 refills | Status: DC | PRN

## 2019-08-09 ENCOUNTER — Other Ambulatory Visit (INDEPENDENT_AMBULATORY_CARE_PROVIDER_SITE_OTHER): Payer: Self-pay | Admitting: Vascular Surgery

## 2019-08-09 DIAGNOSIS — I714 Abdominal aortic aneurysm, without rupture, unspecified: Secondary | ICD-10-CM

## 2019-08-09 DIAGNOSIS — I739 Peripheral vascular disease, unspecified: Secondary | ICD-10-CM

## 2019-08-09 DIAGNOSIS — Z95828 Presence of other vascular implants and grafts: Secondary | ICD-10-CM

## 2019-08-11 ENCOUNTER — Other Ambulatory Visit (INDEPENDENT_AMBULATORY_CARE_PROVIDER_SITE_OTHER): Payer: Medicare Other

## 2019-08-11 ENCOUNTER — Ambulatory Visit (INDEPENDENT_AMBULATORY_CARE_PROVIDER_SITE_OTHER): Payer: Medicare Other | Admitting: Nurse Practitioner

## 2019-08-11 ENCOUNTER — Encounter (INDEPENDENT_AMBULATORY_CARE_PROVIDER_SITE_OTHER): Payer: Medicare Other

## 2019-08-19 ENCOUNTER — Other Ambulatory Visit: Payer: Self-pay | Admitting: Internal Medicine

## 2019-08-19 DIAGNOSIS — I5032 Chronic diastolic (congestive) heart failure: Secondary | ICD-10-CM

## 2019-09-18 ENCOUNTER — Other Ambulatory Visit: Payer: Self-pay | Admitting: Internal Medicine

## 2019-09-18 DIAGNOSIS — K219 Gastro-esophageal reflux disease without esophagitis: Secondary | ICD-10-CM

## 2019-10-18 ENCOUNTER — Telehealth: Payer: Self-pay

## 2019-10-18 NOTE — Telephone Encounter (Signed)
Called and left VM asking pt to call back and schedule an appt for a CPE.  CM

## 2019-10-19 ENCOUNTER — Encounter: Payer: Self-pay | Admitting: Pain Medicine

## 2019-10-19 NOTE — Progress Notes (Signed)
Patient: Tasha Reyes  Service Category: E/M  Provider:  A , MD  DOB: 01/23/1931  DOS: 10/20/2019  Location: Office  MRN: [id number]  Setting: Ambulatory outpatient  Referring Provider: Berglund, Laura H, MD  Type: Established Patient  Specialty: Interventional Pain Management  PCP: Berglund, Laura H, MD  Location: Remote location  Delivery: TeleHealth     Virtual Encounter - Pain Management PROVIDER NOTE: Information contained herein reflects review and annotations entered in association with encounter. Interpretation of such information and data should be left to medically-trained personnel. Information provided to patient can be located elsewhere in the medical record under "Patient Instructions". Document created using STT-dictation technology, any transcriptional errors that may result from process are unintentional.    Contact & Pharmacy Preferred: [phone] Home: [phone] (home) Mobile: There is no such number on file (mobile). E-mail: No e-mail address on record  [id number] - [place], [place] - [address] [place] [address] [place] [address] Phone: [phone] Fax: [phone]  [id number] - [place], [place] - [address] [place]  [address] Phone: [phone] Fax: [phone]   Pre-screening  Tasha Reyes offered "in-person" vs "virtual" encounter. She indicated preferring virtual for this encounter.   Reason COVID-19*  Social distancing based on CDC and AMA recommendations.   I contacted Tasha Reyes on 10/20/2019 via telephone.      I clearly identified myself as  A , MD. I verified that I was speaking with the correct person using two identifiers (Name: Tasha Reyes, and date of birth: 03/20/1931).  Consent I sought verbal advanced consent from Tasha Reyes for virtual visit interactions. I informed Tasha Reyes of possible security and  privacy concerns, risks, and limitations associated with providing "not-in-person" medical evaluation and management services. I also informed Tasha Reyes of the availability of "in-person" appointments. Finally, I informed her that there would be a charge for the virtual visit and that she could be  personally, fully or partially, financially responsible for it. Tasha Reyes expressed understanding and agreed to proceed.   Historic Elements   Tasha Reyes is a 86 y.o. year old, female patient evaluated today after her last contact with our practice on Visit date not found. Tasha Reyes  has a past medical history of Acute on chronic respiratory failure (HCC) (05/20/2018), Aneurysm of iliac artery (HCC) (05/11/2015), CHF (congestive heart failure) (HCC), COPD (chronic obstructive pulmonary disease) (HCC), Cystitis, History of DVT (deep vein thrombosis) (05/11/2015), History of peptic ulcer disease (05/11/2015), [name], Hypertension, Insomnia, Losing weight, Migraines, [name], Rectal bleeding, Stroke (HCC), [name]; [name]; [name], 2008); Back surgery; Colonoscopy (2012); Esophagogastroduodenoscopy (04/2013); Abdominal hysterectomy; Appendectomy; blood clot ; Hip Arthroplasty (Right, 06/17/2017); and Joint replacement (Right, 06/19/2017). Tasha Reyes has a current medication list which includes the following prescription(s): albuterol, atenolol, atorvastatin, vitamin d3, clopidogrel, ferosul, furosemide, [START ON 10/21/2019] hydrocodone-acetaminophen, [START ON 11/20/2019] hydrocodone-acetaminophen, [START ON 12/20/2019] hydrocodone-acetaminophen, methocarbamol, mirtazapine, oxygen-helium, pantoprazole, and ondansetron. She  reports that she has been smoking cigarettes. She has a 15.25 pack-year smoking  history. She has never used smokeless tobacco. She reports that she does not drink alcohol or use drugs. Tasha Reyes has No Known Allergies.   HPI  Today, she is being contacted for medication management.    The patient indicates doing well with the current medication regimen. No adverse reactions or side effects reported to the medications.   Pharmacotherapy Assessment  Analgesic: Hydrocodone/APAP 5/325 mg, 1 tab PO q 8 hrs (15 mg/day of hydrocodone) MME/day:15 mg/day.   Monitoring: [place] PMP: PDMP reviewed during this encounter.       Pharmacotherapy: No side-effects or adverse reactions reported. Compliance: No problems identified. Effectiveness: Clinically acceptable. Plan: Refer to "POC".  UDS:  Summary  Date Value Ref Range Status  10/21/2017 FINAL  Final    Comment:    ==================================================================== TOXASSURE SELECT 13 (MW) ==================================================================== Test                             Result       Flag       Units Drug Present not Declared for Prescription Verification   Hydrocodone                    1014         UNEXPECTED ng/mg creat   Hydromorphone                  211          UNEXPECTED ng/mg creat   Dihydrocodeine                 221          UNEXPECTED ng/mg creat   Norhydrocodone                 1450         UNEXPECTED ng/mg creat    Sources of hydrocodone include scheduled prescription    medications. Hydromorphone, dihydrocodeine and norhydrocodone are    expected metabolites of hydrocodone. Hydromorphone and    dihydrocodeine are also available as scheduled prescription    medications. ==================================================================== Test                      Result    Flag   Units      Ref Range   Creatinine              180              mg/dL      >=20 ==================================================================== Declared Medications:  The flagging and  interpretation on this report are based on the  following declared medications.  Unexpected results may arise from  inaccuracies in the declared medications.  **Note: The testing scope of this panel does not include following  reported medications:  Atenolol  Atorvastatin  Cholecalciferol  Clopidogrel  Iron (Ferrous Sulfate)  Methocarbamol  Polyethylene Glycol  Prednisone ==================================================================== For clinical consultation, please call [phone]. ====================================================================    Laboratory Chemistry Profile   Renal Lab Results  Component Value Date   BUN 29 (H) 05/23/2018   CREATININE 1.03 (H) 05/23/2018   BCR 10 (L) 01/20/2018   GFRAA 56 (L) 05/23/2018   GFRNONAA 48 (L) 05/23/2018     Hepatic Lab Results  Component Value Date   AST 24 05/20/2018   ALT 19 05/20/2018   ALBUMIN 3.7 05/20/2018   ALKPHOS 64 05/20/2018     Electrolytes Lab Results  Component Value Date   NA 140 05/23/2018   K 4.3 05/23/2018   CL 99 05/23/2018   CALCIUM 8.5 (L) 05/23/2018   MG 1.8 01/20/2018     Bone Lab Results  Component Value Date     VD25OH 5.0 (L) 01/08/2016   25OHVITD1 41 01/20/2018   25OHVITD2 13 01/20/2018   25OHVITD3 28 01/20/2018     Inflammation (CRP: Acute Phase) (ESR: Chronic Phase) Lab Results  Component Value Date   CRP 5 01/20/2018   ESRSEDRATE 23 01/20/2018       Note: Above Lab results reviewed.  Imaging  DG Chest 2 View CLINICAL DATA:  86 year old female with right lower lobe infection last month. Continued productive cough with low-grade fever.  EXAM: CHEST - 2 VIEW  COMPARISON:  Chest radiographs 05/20/2018 and earlier.  FINDINGS: Chronic pulmonary hyperinflation. Mediastinal contours are stable since 2016 and within normal limits. Calcified aortic atherosclerosis. Visualized tracheal air column is within normal limits. No pneumothorax, pulmonary edema,  pleural effusion or consolidation. Patchy right lower lobe opacity seen in November has resolved. Osteopenia. No acute osseous abnormality identified.  IMPRESSION: Chronic lung disease with pulmonary hyperinflation. Resolved right lower lobe opacity and small pleural effusions since November. No acute cardiopulmonary abnormality.  Electronically Signed   By: Genevie Ann M.D.   On: 07/01/2018 07:52  Assessment  The primary encounter diagnosis was Chronic lower extremity pain (Primary Area of Pain) (Right). Diagnoses of Chronic low back pain (Secondary area of Pain) (Bilateral) w/ sciatica (Bilateral) (R>L) and Chronic pain syndrome were also pertinent to this visit.  Plan of Care  Problem-specific:  No problem-specific Assessment & Plan notes found for this encounter.  Tasha Reyes has a current medication list which includes the following long-term medication(s): albuterol, atenolol, atorvastatin, vitamin d3, ferosul, furosemide, [START ON 10/21/2019] hydrocodone-acetaminophen, [START ON 11/20/2019] hydrocodone-acetaminophen, [START ON 12/20/2019] hydrocodone-acetaminophen, methocarbamol, mirtazapine, pantoprazole, and ondansetron.  Pharmacotherapy (Medications Ordered): Meds ordered this encounter  Medications  . HYDROcodone-acetaminophen (NORCO/VICODIN) 5-325 MG tablet    Sig: Take 1 tablet by mouth every 8 (eight) hours as needed for severe pain. Must last 30 days    Dispense:  90 tablet    Refill:  0    Chronic Pain: STOP Act (Not applicable) Fill 1 day early if closed on refill date. Do not fill until: 10/21/2019. To last until: 11/20/2019. Avoid benzodiazepines within 8 hours of opioids  . HYDROcodone-acetaminophen (NORCO/VICODIN) 5-325 MG tablet    Sig: Take 1 tablet by mouth every 8 (eight) hours as needed for severe pain. Must last 30 days    Dispense:  90 tablet    Refill:  0    Chronic Pain: STOP Act (Not applicable) Fill 1 day early if closed on refill date. Do not fill  until: 11/20/2019. To last until: 12/20/2019. Avoid benzodiazepines within 8 hours of opioids  . HYDROcodone-acetaminophen (NORCO/VICODIN) 5-325 MG tablet    Sig: Take 1 tablet by mouth every 8 (eight) hours as needed for severe pain. Must last 30 days    Dispense:  90 tablet    Refill:  0    Chronic Pain: STOP Act (Not applicable) Fill 1 day early if closed on refill date. Do not fill until: 12/20/2019. To last until: 01/19/2020. Avoid benzodiazepines within 8 hours of opioids   Orders:  No orders of the defined types were placed in this encounter.  Follow-up plan:   Return in about 3 months (around 01/19/2020) for (F2F), (MM).      Interventional therapies: Planned, scheduled, and/or pending:   Not at this time.   Considering:   NOTE: PLAVIX ANTICOAGULATION (Stop: 7-10 days  Restart: 2 hors) Diagnostic right L4 TFESI  Diagnostic right L4-5 LESI  Diagnostic bilateral lumbar facet block  Possible bilateral lumbar facet RFA    Palliative PRN treatment(s):   Diagnostic right L4 TFESI Diagnostic right L4-5 LESI  Diagnostic bilateral lumbar facet block      Recent Visits No visits were found meeting these conditions.  Showing recent visits within past 90 days and meeting all other requirements   Today's Visits Date Type Provider Dept  10/20/19 Telemedicine , , MD [facility]  Showing today's visits and meeting all other requirements   Future Appointments No visits were found meeting these conditions.  Showing future appointments within next 90 days and meeting all other requirements   I discussed the assessment and treatment plan with the patient. The patient was provided an opportunity to ask questions and all were answered. The patient agreed with the plan and demonstrated an understanding of the instructions.  Patient advised to call back or seek an in-person evaluation if the symptoms or condition worsens.  Duration of encounter: 12 minutes.  Note by:   A , MD Date: 10/20/2019; Time: 2:24 PM 

## 2019-10-20 ENCOUNTER — Ambulatory Visit: Payer: Medicare Other | Attending: Pain Medicine | Admitting: Pain Medicine

## 2019-10-20 ENCOUNTER — Other Ambulatory Visit: Payer: Self-pay

## 2019-10-20 DIAGNOSIS — M5442 Lumbago with sciatica, left side: Secondary | ICD-10-CM | POA: Diagnosis not present

## 2019-10-20 DIAGNOSIS — G894 Chronic pain syndrome: Secondary | ICD-10-CM | POA: Diagnosis not present

## 2019-10-20 DIAGNOSIS — M5441 Lumbago with sciatica, right side: Secondary | ICD-10-CM

## 2019-10-20 DIAGNOSIS — M79604 Pain in right leg: Secondary | ICD-10-CM

## 2019-10-20 DIAGNOSIS — G8929 Other chronic pain: Secondary | ICD-10-CM

## 2019-10-20 MED ORDER — HYDROCODONE-ACETAMINOPHEN 5-325 MG PO TABS: 1.0000 | ORAL_TABLET | Freq: Three times a day (TID) | ORAL | 0 refills | Status: DC | PRN

## 2019-10-25 ENCOUNTER — Ambulatory Visit: Payer: Medicare Other | Admitting: Internal Medicine

## 2019-10-29 ENCOUNTER — Other Ambulatory Visit: Payer: Self-pay | Admitting: Internal Medicine

## 2019-10-29 DIAGNOSIS — I714 Abdominal aortic aneurysm, without rupture, unspecified: Secondary | ICD-10-CM

## 2019-11-03 ENCOUNTER — Ambulatory Visit: Payer: Medicare Other | Admitting: Internal Medicine

## 2019-11-12 ENCOUNTER — Ambulatory Visit: Payer: Medicare Other | Admitting: Internal Medicine

## 2019-11-17 ENCOUNTER — Encounter: Payer: Self-pay | Admitting: Internal Medicine

## 2019-11-17 ENCOUNTER — Other Ambulatory Visit: Payer: Self-pay

## 2019-11-17 ENCOUNTER — Ambulatory Visit (INDEPENDENT_AMBULATORY_CARE_PROVIDER_SITE_OTHER): Payer: Medicare Other | Admitting: Internal Medicine

## 2019-11-17 VITALS — BP 134/70 | HR 89 | Ht 66.0 in | Wt 87.0 lb

## 2019-11-17 DIAGNOSIS — E43 Unspecified severe protein-calorie malnutrition: Secondary | ICD-10-CM | POA: Diagnosis not present

## 2019-11-17 DIAGNOSIS — F332 Major depressive disorder, recurrent severe without psychotic features: Secondary | ICD-10-CM

## 2019-11-17 DIAGNOSIS — I5032 Chronic diastolic (congestive) heart failure: Secondary | ICD-10-CM

## 2019-11-17 DIAGNOSIS — M4696 Unspecified inflammatory spondylopathy, lumbar region: Secondary | ICD-10-CM | POA: Diagnosis not present

## 2019-11-17 DIAGNOSIS — I739 Peripheral vascular disease, unspecified: Secondary | ICD-10-CM | POA: Diagnosis not present

## 2019-11-17 DIAGNOSIS — I10 Essential (primary) hypertension: Secondary | ICD-10-CM | POA: Diagnosis not present

## 2019-11-17 DIAGNOSIS — R112 Nausea with vomiting, unspecified: Secondary | ICD-10-CM

## 2019-11-17 DIAGNOSIS — E785 Hyperlipidemia, unspecified: Secondary | ICD-10-CM

## 2019-11-17 DIAGNOSIS — J449 Chronic obstructive pulmonary disease, unspecified: Secondary | ICD-10-CM | POA: Diagnosis not present

## 2019-11-17 DIAGNOSIS — Z8711 Personal history of peptic ulcer disease: Secondary | ICD-10-CM | POA: Diagnosis not present

## 2019-11-17 DIAGNOSIS — R1310 Dysphagia, unspecified: Secondary | ICD-10-CM | POA: Diagnosis not present

## 2019-11-17 MED ORDER — ONDANSETRON 8 MG PO TBDP: 8.0000 mg | ORAL_TABLET | Freq: Two times a day (BID) | ORAL | 0 refills | Status: DC

## 2019-11-17 MED ORDER — HYDROXYZINE PAMOATE 25 MG PO CAPS: 25.0000 mg | ORAL_CAPSULE | Freq: Three times a day (TID) | ORAL | 2 refills | Status: DC | PRN

## 2019-11-17 NOTE — Progress Notes (Signed)
Date:  11/17/2019   Name:  Tasha Reyes   DOB:  06/02/1931   MRN:  [id number]   Chief Complaint: Hypertension (follow up with labs. Wants to discuss anxiety- says she needs something for her "nerves." Also said she needs RF for Zofran and wants prenisone to help her appetite. ), restless leg, and Gastroesophageal Reflux  Immunization History  Administered Date(s) Administered  . Influenza, High Dose Seasonal PF 05/29/2018  . Influenza,inj,Quad PF,6+ Mos 03/19/2017  . Pneumococcal Conjugate-13 10/13/2015  . Pneumococcal Polysaccharide-23 01/18/2014    Hypertension This is a chronic problem. The problem is controlled. Associated symptoms include headaches, palpitations and shortness of breath. Pertinent negatives include no chest pain. Past treatments include beta blockers and diuretics. The current treatment provides significant improvement. Hypertensive end-organ damage includes heart failure and PVD.  Gastroesophageal Reflux She complains of choking, coughing, heartburn and nausea. She reports no abdominal pain or no chest pain. This is a recurrent problem. Associated symptoms include fatigue. She has tried a PPI for the symptoms. The treatment provided moderate relief.  Hyperlipidemia This is a chronic problem. The problem is controlled. Associated symptoms include myalgias and shortness of breath. Pertinent negatives include no chest pain. Current antihyperlipidemic treatment includes statins. The current treatment provides significant improvement of lipids.  Depression        This is a chronic problem.  The problem has been gradually worsening since onset.  Associated symptoms include fatigue, appetite change, myalgias and headaches.  Associated symptoms include no suicidal ideas.  Treatments tried: mirtazepine - stopped this due to side effects (?) Back Pain This is a chronic problem. The pain is present in the lumbar spine. Associated symptoms include headaches. Pertinent  negatives include no abdominal pain or chest pain. She has tried analgesics (treated by pain management) for the symptoms.  Dysphagia - she relates trouble keeping liquids down - they stop in her upper esophagus and then are regurgitated through her nose which is very painful.  She also eats only once a day and has severe nausea with vomiting afterwards unless she takes zofran.  She takes pantoprazole for hx of GI bleed from taking plavix.  Her weight is down to 87 lbs from 96 lbs 15 months ago.  COPD - She continues to smoke several cigarettes daily.  She has home oxygen that she uses at night.  She has albuterol nebs.  She has a chronic cough with baseline SOB with any exertion.  She denies bloody or discolored sputum.  PVD - she complains of more pain in the left leg.  She has noticed more redness and cold temperature as well.  She is s/p graft to her right LE; cancelled her follow up with Dr. Lucky Cowboy earlier this year. Despite hx of gi bleed on plavix, she continues to take this.  Lab Results  Component Value Date   CREATININE 1.03 (H) 05/23/2018   BUN 29 (H) 05/23/2018   NA 140 05/23/2018   K 4.3 05/23/2018   CL 99 05/23/2018   CO2 32 05/23/2018   Lab Results  Component Value Date   CHOL 147 03/19/2017   HDL 57 03/19/2017   LDLCALC 63 03/19/2017   TRIG 135 03/19/2017   CHOLHDL 2.6 03/19/2017   Lab Results  Component Value Date   TSH 1.111 06/16/2017   No results found for: HGBA1C Lab Results  Component Value Date   WBC 8.5 05/21/2018   HGB 13.0 05/21/2018   HCT 42.1 05/21/2018   MCV  86.6 05/21/2018   PLT 518 (H) 05/21/2018   Lab Results  Component Value Date   ALT 19 05/20/2018   AST 24 05/20/2018   ALKPHOS 64 05/20/2018   BILITOT 0.7 05/20/2018     Review of Systems  Constitutional: Positive for appetite change and fatigue. Negative for chills and diaphoresis.  HENT: Positive for trouble swallowing (liquids come back up; has to take zofran to keep food down).     Respiratory: Positive for cough, choking and shortness of breath.   Cardiovascular: Positive for palpitations and leg swelling. Negative for chest pain.  Gastrointestinal: Positive for heartburn and nausea. Negative for abdominal pain, constipation and diarrhea.  Musculoskeletal: Positive for back pain, gait problem and myalgias.  Neurological: Positive for dizziness and headaches.  Psychiatric/Behavioral: Positive for depression. Negative for suicidal ideas.    Patient Active Problem List   Diagnosis Date Noted  . Unspecified inflammatory spondylopathy, lumbar region ([place]) 11/17/2019  . Chronic musculoskeletal pain 01/11/2019  . Pharmacologic therapy 01/11/2019  . Problems influencing health status 01/11/2019  . Chronic heart failure with preserved ejection fraction (HFpEF) ([place]) 11/11/2018  . Chronic diastolic heart failure ([place]) 05/29/2018  . Malnutrition of moderate degree 05/22/2018  . Chronic pain syndrome 03/02/2018  . Chronic low back pain (Secondary area of Pain) (Bilateral) w/ sciatica (Bilateral) (R>L) 02/24/2018  . Bilateral leg numbness 02/24/2018  . Accidental medication overdose 02/22/2018  . DDD (degenerative disc disease), lumbar 02/03/2018  . Chronic anticoagulation (Plavix) 02/03/2018  . Chronic lower extremity pain (Bilateral) (R>L) 01/20/2018  . Disorder of skeletal system 01/20/2018  . Chronic hip pain (Right) 10/21/2017  . Rectal bleeding 09/06/2017  . History of repair of hip fracture 06/16/2017  . AAA (abdominal aortic aneurysm) without rupture ([place]) 03/18/2017  . Edema due to malnutrition ([place]) 10/28/2016  . Dependence on supplemental oxygen 09/04/2016  . Protein-calorie malnutrition, severe 08/20/2016  . Nausea with vomiting 07/30/2016  . Vitamin D deficiency 01/10/2016  . Opioid-induced constipation ([facility]) 11/07/2015  . Osteoporosis, post-menopausal 11/07/2015  . Complaints of weakness of lower extremities (Bilateral) 11/07/2015  . Lumbar foraminal  stenosis (Right) (Severe at L4-5) 06/20/2015  . Chronic lower extremity pain (Primary Area of Pain) (Right) 05/11/2015  . Lumbosacral Radiculopathy (Right) 05/11/2015  . Chronic radicular pain of lower extremity (Right) 05/11/2015  . Peripheral vascular disease ([place]) 05/11/2015  . Hx of long term use of blood thinners (Plavix) 05/11/2015  . Lower extremity pain (Right) 05/11/2015  . Neurogenic pain 05/11/2015  . History of DVT (deep vein thrombosis) 05/11/2015  . Lumbar facet syndrome (Bilateral) 05/11/2015  . Dyslipidemia 12/23/2014  . Compulsive tobacco user syndrome 12/23/2014  . History of bleeding peptic ulcer 12/23/2014  . Essential (primary) hypertension 12/23/2014  . Depression, major, single episode, complete remission ([place]) 12/23/2014  . COPD (chronic obstructive pulmonary disease) ([place]) 12/23/2014  . Restless leg 12/23/2014  . Chronic tension-type headache, intractable 12/23/2014    No Known Allergies  Past Surgical History:  Procedure Laterality Date  . ABDOMINAL AORTIC ANEURYSM REPAIR W/ ENDOLUMINAL GRAFT  2008  . ABDOMINAL HYSTERECTOMY    . APPENDECTOMY    . BACK SURGERY    . blood clot      removal right leg  . COLONOSCOPY  2012  . ESOPHAGOGASTRODUODENOSCOPY  04/2013   gastric ulcers  . FEMORAL-POPLITEAL BYPASS GRAFT Right 2008  . HIP ARTHROPLASTY Right 06/17/2017   Procedure: ARTHROPLASTY BIPOLAR HIP (HEMIARTHROPLASTY);  Surgeon: Earnestine Leys, MD;  Location: [facility] ORS;  Service: Orthopedics;  Laterality: Right;  .  JOINT REPLACEMENT Right 06/19/2017   right hip FX.  Marland Kitchen PARTIAL HYSTERECTOMY      Social History   Tobacco Use  . Smoking status: Current Every Day Smoker    Packs/day: 0.10    Years: 61.00    Pack years: 6.10    Types: Cigarettes  . Smokeless tobacco: Never Used  . Tobacco comment: 4 cigrettes a day- cut back  Substance Use Topics  . Alcohol use: No    Alcohol/week: 0.0 standard drinks  . Drug use: No     Medication list has been  reviewed and updated.  Current Meds  Medication Sig  . albuterol (PROVENTIL) (2.5 MG/3ML) 0.083% nebulizer solution 2.5 mg inhaled every 8 hours.  Marland Kitchen atenolol (TENORMIN) 25 MG tablet TAKE 1 TABLET BY MOUTH TWICE DAILY  . atorvastatin (LIPITOR) 10 MG tablet TAKE 1 TABLET(10 MG) BY MOUTH AT BEDTIME  . clopidogrel (PLAVIX) 75 MG tablet TAKE 1 TABLET BY MOUTH DAILY  . furosemide (LASIX) 20 MG tablet TAKE 1 TABLET(20 MG) BY MOUTH DAILY  . HYDROcodone-acetaminophen (NORCO/VICODIN) 5-325 MG tablet Take 1 tablet by mouth every 8 (eight) hours as needed for severe pain. Must last 30 days  . [START ON 11/20/2019] HYDROcodone-acetaminophen (NORCO/VICODIN) 5-325 MG tablet Take 1 tablet by mouth every 8 (eight) hours as needed for severe pain. Must last 30 days  . [START ON 12/20/2019] HYDROcodone-acetaminophen (NORCO/VICODIN) 5-325 MG tablet Take 1 tablet by mouth every 8 (eight) hours as needed for severe pain. Must last 30 days  . ondansetron (ZOFRAN-ODT) 8 MG disintegrating tablet Take 1 tablet (8 mg total) by mouth 2 (two) times daily for 15 days.  . OXYGEN Inhale 2 L/min into the lungs. Use via nasal cannula at night and during the day as needed  . pantoprazole (PROTONIX) 40 MG tablet TAKE 1 TABLET(40 MG) BY MOUTH TWICE DAILY    PHQ 2/9 Scores 11/17/2019 09/09/2018 05/29/2018 05/26/2018  PHQ - 2 Score 6 2 2 2   PHQ- 9 Score 18 10 5  -   GAD 7 : Generalized Anxiety Score 11/17/2019  Nervous, Anxious, on Edge 3  Control/stop worrying 3  Worry too much - different things 3  Trouble relaxing 2  Restless 2  Easily annoyed or irritable 1  Afraid - awful might happen 3  Total GAD 7 Score 17  Anxiety Difficulty Very difficult      BP Readings from Last 3 Encounters:  11/17/19 134/70  09/09/18 100/60  07/27/18 (!) 156/85    Physical Exam Constitutional:      Appearance: She is cachectic.  Eyes:     General: Lids are normal.     Comments: Pale conjunctiva  Cardiovascular:     Rate and Rhythm:  Normal rate and regular rhythm.  No extrasystoles are present.    Pulses:          Radial pulses are 1+ on the right side and 1+ on the left side.       Popliteal pulses are 0 on the right side and 0 on the left side.       Dorsalis pedis pulses are 0 on the right side and 0 on the left side.       Posterior tibial pulses are 0 on the right side and 0 on the left side.     Heart sounds: Normal heart sounds. No murmur.  Pulmonary:     Effort: Pulmonary effort is normal.     Breath sounds: Decreased air movement present. No wheezing  or rhonchi.  Abdominal:     General: Abdomen is flat.     Palpations: Abdomen is soft.     Tenderness: There is no abdominal tenderness.  Musculoskeletal:     Cervical back: Normal range of motion.  Lymphadenopathy:     Cervical: No cervical adenopathy.  Neurological:     Mental Status: She is alert.     Gait: Gait abnormal (uses rolator).  Psychiatric:        Attention and Perception: Attention normal.        Mood and Affect: Mood is depressed.        Speech: Speech normal.        Behavior: Behavior normal.     Wt Readings from Last 3 Encounters:  11/17/19 87 lb (39.5 kg)  09/09/18 96 lb 3.2 oz (43.6 kg)  07/27/18 94 lb (42.6 kg)    BP 134/70   Pulse 89   Ht 5\' 6"  (1.676 m)   Wt 87 lb (39.5 kg)   SpO2 90%   BMI 14.04 kg/m   Assessment and Plan: 1. Essential (primary) hypertension Clinically stable exam with well controlled BP. Tolerating medications without side effects at this time. Pt to continue current regimen and low sodium diet; benefits of regular exercise as able discussed. - CBC with Differential/Platelet - TSH + free T4  2. Unspecified inflammatory spondylopathy, lumbar region [facility]) Under the care of pain management On chronic narcotics - Hydrocodone daily  3. Protein-calorie malnutrition, severe ([place]) Weight continues to decrease due to poor intake, dysphagia, depression She is at risk for adverse consequences but  declines nutritional supplements - Prealbumin  4. Chronic obstructive pulmonary disease, unspecified COPD type ([place]) Hypoxemia noted with minimal SOB at rest; using Oxygen at home but not outside the home Continues to smoke daily  5. Chronic diastolic heart failure ([place]) She appears euvolemic today on a small daily dose of lasix  6. Severe recurrent major depression without psychotic features ([place]) Intolerant of Remeron; she is not interested in taking antidepressants but wants something to take as needed for anxiety - which she states would be at least once a day Will strictly avoid benzo due to age, copd and pain contract - hydrOXYzine (VISTARIL) 25 MG capsule; Take 1 capsule (25 mg total) by mouth 3 (three) times daily as needed for anxiety.  Dispense: 30 capsule; Refill: 2  7. Dyslipidemia On statin therapy - no apparent side effect but no labs in 18 months - Comprehensive metabolic panel - Lipid panel  8. Peripheral vascular disease ([place]) Worsening left leg symptoms likely due to pvd She cancelled her appt with vascular surgery but is encouraged to reschedule  She needs follow up ABIs and dopplers  9. History of bleeding peptic ulcer Pt has not noticed any blood in her stool or emesis She continues on pantoprazole and Plavix which supposedly caused a previous bleed Conjunctiva are pale - will check CBC She also need GI evaluation for dysphagia, n/v   10. Intractable vomiting with nausea, unspecified vomiting type Will refill zofran started by pain management but this does not address the underlying issue Pt agrees to see GI - Ambulatory referral to Gastroenterology - ondansetron (ZOFRAN-ODT) 8 MG disintegrating tablet; Take 1 tablet (8 mg total) by mouth 2 (two) times daily for 15 days.  Dispense: 30 tablet; Refill: 0  11. Dysphagia, unspecified type Mainly to liquids I am concerned about an esophageal carcinoma - Ambulatory referral to Gastroenterology   Partially  dictated using Dragon  software. Any errors are unintentional.  Halina Maidens, MD [facility]  11/17/2019

## 2019-11-18 LAB — COMPREHENSIVE METABOLIC PANEL
ALT: 11 IU/L (ref 0–32)
AST: 20 IU/L (ref 0–40)
Albumin/Globulin Ratio: 1.3 (ref 1.2–2.2)
Albumin: 4.3 g/dL (ref 3.6–4.6)
Alkaline Phosphatase: 83 IU/L (ref 39–117)
BUN/Creatinine Ratio: 15 (ref 12–28)
BUN: 12 mg/dL (ref 8–27)
Bilirubin Total: <0.2 mg/dL (ref 0.0–1.2)
CO2: 30 mmol/L — ABNORMAL HIGH (ref 20–29)
Calcium: 9.7 mg/dL (ref 8.7–10.3)
Chloride: 99 mmol/L (ref 96–106)
Creatinine, Ser: 0.79 mg/dL (ref 0.57–1.00)
GFR calc Af Amer: 78 mL/min/{1.73_m2} (ref >59)
GFR calc non Af Amer: 68 mL/min/{1.73_m2} (ref >59)
Globulin, Total: 3.3 g/dL (ref 1.5–4.5)
Glucose: 96 mg/dL (ref 65–99)
Potassium: 5 mmol/L (ref 3.5–5.2)
Sodium: 142 mmol/L (ref 134–144)
Total Protein: 7.6 g/dL (ref 6.0–8.5)

## 2019-11-18 LAB — CBC WITH DIFFERENTIAL/PLATELET
Basophils Absolute: 0.1 10*3/uL (ref 0.0–0.2)
Basos: 1 %
EOS (ABSOLUTE): 0.2 10*3/uL (ref 0.0–0.4)
Eos: 2 %
Hematocrit: 37.5 % (ref 34.0–46.6)
Hemoglobin: 11.9 g/dL (ref 11.1–15.9)
Immature Grans (Abs): 0 10*3/uL (ref 0.0–0.1)
Immature Granulocytes: 0 %
Lymphocytes Absolute: 2.4 10*3/uL (ref 0.7–3.1)
Lymphs: 27 %
MCH: 30.1 pg (ref 26.6–33.0)
MCHC: 31.7 g/dL (ref 31.5–35.7)
MCV: 95 fL (ref 79–97)
Monocytes Absolute: 0.9 10*3/uL (ref 0.1–0.9)
Monocytes: 10 %
Neutrophils Absolute: 5.4 10*3/uL (ref 1.4–7.0)
Neutrophils: 60 %
Platelets: 402 10*3/uL (ref 150–450)
RBC: 3.95 x10E6/uL (ref 3.77–5.28)
RDW: 12.9 % (ref 11.7–15.4)
WBC: 8.9 10*3/uL (ref 3.4–10.8)

## 2019-11-18 LAB — PREALBUMIN: PREALBUMIN: 25 mg/dL (ref 9–32)

## 2019-11-18 LAB — LIPID PANEL
Chol/HDL Ratio: 2.3 ratio (ref 0.0–4.4)
Cholesterol, Total: 159 mg/dL (ref 100–199)
HDL: 70 mg/dL (ref >39)
LDL Chol Calc (NIH): 65 mg/dL (ref 0–99)
Triglycerides: 139 mg/dL (ref 0–149)
VLDL Cholesterol Cal: 24 mg/dL (ref 5–40)

## 2019-11-18 LAB — TSH+FREE T4
Free T4: 1.29 ng/dL (ref 0.82–1.77)
TSH: 1.24 u[IU]/mL (ref 0.450–4.500)

## 2019-11-30 ENCOUNTER — Telehealth: Payer: Self-pay

## 2019-11-30 NOTE — Telephone Encounter (Signed)
-----   Message from Glean Hess, MD sent at 11/27/2019  4:19 PM EDT ----- Please call the patient - GI is trying to call her to get an appointment for her swallowing issues.  It is very important that she get this done soon. ----- Message ----- From: Shelby Mattocks, CMA Sent: 11/26/2019   9:35 AM EDT To: Glean Hess, MD

## 2019-11-30 NOTE — Telephone Encounter (Signed)
Called and left pt a VM letting her know that [facility] has been trying to reach her to schedule an appt about swallowing issues.  Told her to call them to schedule her appt.  CM

## 2019-12-28 ENCOUNTER — Other Ambulatory Visit: Payer: Self-pay | Admitting: Internal Medicine

## 2019-12-28 DIAGNOSIS — I10 Essential (primary) hypertension: Secondary | ICD-10-CM

## 2019-12-28 NOTE — Telephone Encounter (Signed)
Requested Prescriptions  Pending Prescriptions Disp Refills   atenolol (TENORMIN) 25 MG tablet [Pharmacy Med Name: ATENOLOL 25MG  TABLETS] 180 tablet 1    Sig: TAKE 1 TABLET BY MOUTH TWICE DAILY     Cardiovascular:  Beta Blockers Passed - 12/28/2019  3:35 AM      Passed - Last BP in normal range    BP Readings from Last 1 Encounters:  11/17/19 134/70         Passed - Last Heart Rate in normal range    Pulse Readings from Last 1 Encounters:  11/17/19 89         Passed - Valid encounter within last 6 months    Recent Outpatient Visits          1 month ago Unspecified inflammatory spondylopathy, lumbar region [facility])   [facility] Glean Hess, MD   1 year ago Community acquired pneumonia of right lower lobe of lung [facility])   [facility] Glean Hess, MD   1 year ago Community acquired pneumonia of right lower lobe of lung [facility])   [facility] Glean Hess, MD   2 years ago Chronic obstructive pulmonary disease, unspecified COPD type [facility])   [facility] Glean Hess, MD   2 years ago Essential (primary) hypertension   [facility] Glean Hess, MD      Future Appointments            In 4 months Army Melia Jesse Sans, MD [facility], [facility]

## 2020-01-12 NOTE — Progress Notes (Signed)
Patient: Tasha Reyes  Service Category: E/M  Provider: Gaspar Cola, MD  DOB: Nov 25, 1930  DOS: 01/13/2020  Location: Office  MRN: [id number]  Setting: Ambulatory outpatient  Referring Provider: Glean Hess, MD  Type: Established Patient  Specialty: Interventional Pain Management  PCP: Glean Hess, MD  Location: Remote location  Delivery: TeleHealth     Virtual Encounter - Pain Management PROVIDER NOTE: Information contained herein reflects review and annotations entered in association with encounter. Interpretation of such information and data should be left to medically-trained personnel. Information provided to patient can be located elsewhere in the medical record under "Patient Instructions". Document created using STT-dictation technology, any transcriptional errors that may result from process are unintentional.    Contact & Pharmacy Preferred: [phone] Home: [phone] (home) Mobile: There is no such number on file (mobile). E-mail: No e-mail address on record  [facility], [place] [address] [place] [address] Phone: [phone] Fax: [phone]  [id number] [id number]: [phone]: [phone]  Pre-screening  Tasha Reyes offered "in-person" vs "virtual" encounter. She indicated preferring virtual for this encounter.   Reason COVID-19*   Social distancing based on CDC and AMA recommendations.   I contacted Tasha CLONCHon 01/13/2020 via telephone.      I clearly identified myself as FGaspar Cola MD. I verified that I was speaking with the correct person using two identifiers (Name: Tasha Reyes and date of birth: [birth year].  Consent I sought verbal advanced consent from Tasha Canalfor virtual visit interactions. I informed Tasha Reyes of possible security and  privacy concerns, risks, and limitations associated with providing "not-in-person" medical evaluation and management services. I also informed Tasha Reyes of the availability of "in-person" appointments. Finally, I informed her that there would be a charge for the virtual visit and that she could be  personally, fully or partially, financially responsible for it. Ms. EMullinsexpressed understanding and agreed to proceed.   Historic Elements   Ms. BJAMIESON LISAis a 86y.o. year old, female patient evaluated today after her last contact with our practice on Visit date not found. Ms. [name] has a past medical history of Acute on chronic respiratory failure ([place] (05/20/2018), Aneurysm of iliac artery ([place] (05/11/2015), [name], [name], Cystitis, History of DVT (deep vein thrombosis) (05/11/2015), History of peptic ulcer disease (05/11/2015), [name], Hypertension, Insomnia, Losing weight, Migraines, [name], Rectal bleeding, [name], [name]; [name]; [name], 2008); Back surgery; Colonoscopy (2012); Esophagogastroduodenoscopy (04/2013); Abdominal hysterectomy; Appendectomy; blood clot ; Hip Arthroplasty (Right, 06/17/2017); and Joint replacement (Right, 06/19/2017). Ms. EMcbrayerhas a current medication list which includes the following prescription(s): albuterol, atenolol, atorvastatin, clopidogrel, furosemide, [START ON 01/19/2020] hydrocodone-acetaminophen, [START ON 02/18/2020] hydrocodone-acetaminophen, [START ON 03/19/2020] hydrocodone-acetaminophen, hydroxyzine, ondansetron, oxygen-helium, and pantoprazole. She  reports that she has been smoking cigarettes. She has a 6.10 pack-year smoking history. She has never used smokeless  tobacco. She reports that she does not drink alcohol and does not use drugs. Ms. EKozmahas No Known Allergies.   HPI  Today, she is being contacted for medication management.  Initially  we had the patient scheduled for a face-to-face appointment, but her son, who is also her caregiver called indicating that her health has been declining significantly and her weight is down to 87 pounds.  He requested that we switch to appointment to virtual visit, which we have done.  Today I spoke to her about her condition and I asked her what was going on and she simply answered that she was 86 years old and she was tired of living.  She says that she is not quite sure why she keeps losing weight, but she has continued to get weaker and weaker.  I have reviewed her chart, but there is no indication that she has any diagnosis of cancer.  Today she asked me if I would increase her pain medicine, because she thinks that she has gotten tolerant to it.  However, there are many reasons why really do not want to do that including the fact that she is weaker and more fragile and I am not entirely sure that her system would tolerate significant increases in any medication.  In addition to this, the patient has other reasons why I am concerned about increasing her opioids, including a history of an "accidental overdose".  Pharmacotherapy Assessment  Analgesic: Hydrocodone/APAP 5/325 mg, 1 tab PO q 8 hrs (15 mg/day of hydrocodone) MME/day:15 mg/day.   Monitoring: [place] PMP: PDMP reviewed during this encounter.       Pharmacotherapy: No side-effects or adverse reactions reported. Compliance: No problems identified. Effectiveness: Clinically acceptable. Plan: Refer to "POC".  UDS:  Summary  Date Value Ref Range Status  10/21/2017 FINAL  Final    Comment:    ==================================================================== TOXASSURE SELECT 13  (MW) ==================================================================== Test                             Result       Flag       Units Drug Present not Declared for Prescription Verification   Hydrocodone                    1014         UNEXPECTED ng/mg creat   Hydromorphone                  211          UNEXPECTED ng/mg creat   Dihydrocodeine                 221          UNEXPECTED ng/mg creat   Norhydrocodone                 1450         UNEXPECTED ng/mg creat    Sources of hydrocodone include scheduled prescription    medications. Hydromorphone, dihydrocodeine and norhydrocodone are    expected metabolites of hydrocodone. Hydromorphone and    dihydrocodeine are also available as scheduled prescription    medications. ==================================================================== Test                      Result    Flag   Units      Ref Range   Creatinine              180              mg/dL      >=20 ==================================================================== Declared Medications:  The flagging and interpretation on this  report are based on the  following declared medications.  Unexpected results may arise from  inaccuracies in the declared medications.  **Note: The testing scope of this panel does not include following  reported medications:  Atenolol  Atorvastatin  Cholecalciferol  Clopidogrel  Iron (Ferrous Sulfate)  Methocarbamol  Polyethylene Glycol  Prednisone ==================================================================== For clinical consultation, please call [phone]. ====================================================================     Laboratory Chemistry Profile   Renal Lab Results  Component Value Date   BUN 12 11/17/2019   CREATININE 0.79 11/17/2019   BCR 15 11/17/2019   GFRAA 78 11/17/2019   GFRNONAA 68 11/17/2019     Hepatic Lab Results  Component Value Date   AST 20 11/17/2019   ALT 11 11/17/2019   ALBUMIN 4.3  11/17/2019   ALKPHOS 83 11/17/2019     Electrolytes Lab Results  Component Value Date   NA 142 11/17/2019   K 5.0 11/17/2019   CL 99 11/17/2019   CALCIUM 9.7 11/17/2019   MG 1.8 01/20/2018     Bone Lab Results  Component Value Date   VD25OH 5.0 (L) 01/08/2016   25OHVITD1 41 01/20/2018   25OHVITD2 13 01/20/2018   25OHVITD3 28 01/20/2018     Inflammation (CRP: Acute Phase) (ESR: Chronic Phase) Lab Results  Component Value Date   CRP 5 01/20/2018   ESRSEDRATE 23 01/20/2018       Note: Above Lab results reviewed.   Imaging  DG Chest 2 View CLINICAL DATA:  86 year old female with right lower lobe infection last month. Continued productive cough with low-grade fever.  EXAM: CHEST - 2 VIEW  COMPARISON:  Chest radiographs 05/20/2018 and earlier.  FINDINGS: Chronic pulmonary hyperinflation. Mediastinal contours are stable since 2016 and within normal limits. Calcified aortic atherosclerosis. Visualized tracheal air column is within normal limits. No pneumothorax, pulmonary edema, pleural effusion or consolidation. Patchy right lower lobe opacity seen in November has resolved. Osteopenia. No acute osseous abnormality identified.  IMPRESSION: Chronic lung disease with pulmonary hyperinflation. Resolved right lower lobe opacity and small pleural effusions since November. No acute cardiopulmonary abnormality.  Electronically Signed   By: Genevie Ann M.D.   On: 07/01/2018 07:52  Assessment  The primary encounter diagnosis was Chronic pain syndrome. Diagnoses of Chronic lower extremity pain (Primary Area of Pain) (Right), Chronic low back pain (Secondary area of Pain) (Bilateral) w/ sciatica (Bilateral) (R>L), Pharmacologic therapy, and Protein-calorie malnutrition, severe were also pertinent to this visit.  Plan of Care  Problem-specific:  No problem-specific Assessment & Plan notes found for this encounter.  Ms. ASHEY TRAMONTANA has a current medication list which  includes the following long-term medication(s): albuterol, atenolol, atorvastatin, furosemide, [START ON 01/19/2020] hydrocodone-acetaminophen, [START ON 02/18/2020] hydrocodone-acetaminophen, [START ON 03/19/2020] hydrocodone-acetaminophen, ondansetron, and pantoprazole.  Pharmacotherapy (Medications Ordered): Meds ordered this encounter  Medications   HYDROcodone-acetaminophen (NORCO/VICODIN) 5-325 MG tablet    Sig: Take 1 tablet by mouth every 8 (eight) hours as needed for severe pain. Must last 30 days    Dispense:  90 tablet    Refill:  0    Chronic Pain: STOP Act (Not applicable) Fill 1 day early if closed on refill date. Do not fill until: 01/19/2020. To last until: 02/18/2020. Avoid benzodiazepines within 8 hours of opioids   HYDROcodone-acetaminophen (NORCO/VICODIN) 5-325 MG tablet    Sig: Take 1 tablet by mouth every 8 (eight) hours as needed for severe pain. Must last 30 days    Dispense:  90 tablet    Refill:  0  Chronic Pain: STOP Act (Not applicable) Fill 1 day early if closed on refill date. Do not fill until: 02/18/2020. To last until: 03/19/2020. Avoid benzodiazepines within 8 hours of opioids   HYDROcodone-acetaminophen (NORCO/VICODIN) 5-325 MG tablet    Sig: Take 1 tablet by mouth every 8 (eight) hours as needed for severe pain. Must last 30 days    Dispense:  90 tablet    Refill:  0    Chronic Pain: STOP Act (Not applicable) Fill 1 day early if closed on refill date. Do not fill until: 03/19/2020. To last until: 04/18/2020. Avoid benzodiazepines within 8 hours of opioids   Orders:  No orders of the defined types were placed in this encounter.  Follow-up plan:   Return in about 3 months (around 04/17/2020) for F2F(20-min), MM (never on procedure day).      Interventional therapies: Planned, scheduled, and/or pending:   Not at this time.   Considering:   NOTE: PLAVIX ANTICOAGULATION (Stop: 7-10 days   Restart: 2 hors) Diagnostic right L4 TFESI  Diagnostic right L4-5 LESI   Diagnostic bilateral lumbar facet block  Possible bilateral lumbar facet RFA    Palliative PRN treatment(s):   Diagnostic right L4 TFESI Diagnostic right L4-5 LESI  Diagnostic bilateral lumbar facet block     Recent Visits Date Type Provider Dept  10/20/19 Telemedicine Milinda Pointer, MD [facility]  Showing recent visits within past 90 days and meeting all other requirements Today's Visits Date Type Provider Dept  01/13/20 Telemedicine Milinda Pointer, MD [facility]  Showing today's visits and meeting all other requirements Future Appointments No visits were found meeting these conditions. Showing future appointments within next 90 days and meeting all other requirements  I discussed the assessment and treatment plan with the patient. The patient was provided an opportunity to ask questions and all were answered. The patient agreed with the plan and demonstrated an understanding of the instructions.  Patient advised to call back or seek an in-person evaluation if the symptoms or condition worsens.  Duration of encounter: 13 minutes.  Note by: Gaspar Cola, MD Date: 01/13/2020; Time: 2:41 PM

## 2020-01-13 ENCOUNTER — Ambulatory Visit: Payer: Medicare Other | Attending: Pain Medicine | Admitting: Pain Medicine

## 2020-01-13 ENCOUNTER — Other Ambulatory Visit: Payer: Self-pay

## 2020-01-13 DIAGNOSIS — M5441 Lumbago with sciatica, right side: Secondary | ICD-10-CM

## 2020-01-13 DIAGNOSIS — Z79899 Other long term (current) drug therapy: Secondary | ICD-10-CM

## 2020-01-13 DIAGNOSIS — G8929 Other chronic pain: Secondary | ICD-10-CM

## 2020-01-13 DIAGNOSIS — M79604 Pain in right leg: Secondary | ICD-10-CM | POA: Diagnosis not present

## 2020-01-13 DIAGNOSIS — M5442 Lumbago with sciatica, left side: Secondary | ICD-10-CM | POA: Diagnosis not present

## 2020-01-13 DIAGNOSIS — G894 Chronic pain syndrome: Secondary | ICD-10-CM

## 2020-01-13 DIAGNOSIS — E43 Unspecified severe protein-calorie malnutrition: Secondary | ICD-10-CM

## 2020-01-13 MED ORDER — HYDROCODONE-ACETAMINOPHEN 5-325 MG PO TABS: 1.0000 | ORAL_TABLET | Freq: Three times a day (TID) | ORAL | 0 refills | Status: DC | PRN

## 2020-01-20 ENCOUNTER — Other Ambulatory Visit: Payer: Self-pay

## 2020-01-20 ENCOUNTER — Ambulatory Visit: Payer: Self-pay

## 2020-01-20 ENCOUNTER — Emergency Department: Payer: Medicare Other

## 2020-01-20 ENCOUNTER — Encounter: Payer: Self-pay | Admitting: Emergency Medicine

## 2020-01-20 ENCOUNTER — Emergency Department
Admission: EM | Admit: 2020-01-20 | Discharge: 2020-01-20 | Disposition: A | Discharge status: Home / Self Care | Payer: Medicare Other | Attending: Emergency Medicine | Admitting: Emergency Medicine

## 2020-01-20 DIAGNOSIS — R0902 Hypoxemia: Secondary | ICD-10-CM | POA: Diagnosis not present

## 2020-01-20 DIAGNOSIS — R52 Pain, unspecified: Secondary | ICD-10-CM | POA: Diagnosis not present

## 2020-01-20 DIAGNOSIS — Y939 Activity, unspecified: Secondary | ICD-10-CM | POA: Diagnosis not present

## 2020-01-20 DIAGNOSIS — S0990XA Unspecified injury of head, initial encounter: Secondary | ICD-10-CM | POA: Diagnosis not present

## 2020-01-20 DIAGNOSIS — Z79899 Other long term (current) drug therapy: Secondary | ICD-10-CM | POA: Diagnosis not present

## 2020-01-20 DIAGNOSIS — S199XXA Unspecified injury of neck, initial encounter: Secondary | ICD-10-CM | POA: Diagnosis not present

## 2020-01-20 DIAGNOSIS — I509 Heart failure, unspecified: Secondary | ICD-10-CM | POA: Insufficient documentation

## 2020-01-20 DIAGNOSIS — R42 Dizziness and giddiness: Secondary | ICD-10-CM | POA: Diagnosis not present

## 2020-01-20 DIAGNOSIS — G4489 Other headache syndrome: Secondary | ICD-10-CM | POA: Diagnosis not present

## 2020-01-20 DIAGNOSIS — I7090 Unspecified atherosclerosis: Secondary | ICD-10-CM | POA: Diagnosis not present

## 2020-01-20 DIAGNOSIS — Y999 Unspecified external cause status: Secondary | ICD-10-CM | POA: Insufficient documentation

## 2020-01-20 DIAGNOSIS — J449 Chronic obstructive pulmonary disease, unspecified: Secondary | ICD-10-CM | POA: Insufficient documentation

## 2020-01-20 DIAGNOSIS — W19XXXA Unspecified fall, initial encounter: Secondary | ICD-10-CM

## 2020-01-20 DIAGNOSIS — W010XXA Fall on same level from slipping, tripping and stumbling without subsequent striking against object, initial encounter: Secondary | ICD-10-CM | POA: Diagnosis not present

## 2020-01-20 DIAGNOSIS — R404 Transient alteration of awareness: Secondary | ICD-10-CM | POA: Diagnosis not present

## 2020-01-20 DIAGNOSIS — Y929 Unspecified place or not applicable: Secondary | ICD-10-CM | POA: Diagnosis not present

## 2020-01-20 DIAGNOSIS — I110 Hypertensive heart disease with heart failure: Secondary | ICD-10-CM | POA: Diagnosis not present

## 2020-01-20 DIAGNOSIS — R410 Disorientation, unspecified: Secondary | ICD-10-CM | POA: Diagnosis not present

## 2020-01-20 LAB — BASIC METABOLIC PANEL
Anion gap: 12 (ref 5–15)
BUN: 14 mg/dL (ref 8–23)
CO2: 35 mmol/L — ABNORMAL HIGH (ref 22–32)
Calcium: 8.9 mg/dL (ref 8.9–10.3)
Chloride: 94 mmol/L — ABNORMAL LOW (ref 98–111)
Creatinine, Ser: 0.96 mg/dL (ref 0.44–1.00)
GFR calc Af Amer: >60 mL/min (ref >60)
GFR calc non Af Amer: 53 mL/min — ABNORMAL LOW (ref >60)
Glucose, Bld: 119 mg/dL — ABNORMAL HIGH (ref 70–99)
Potassium: 4.5 mmol/L (ref 3.5–5.1)
Sodium: 141 mmol/L (ref 135–145)

## 2020-01-20 LAB — CBC
HCT: 37 % (ref 36.0–46.0)
Hemoglobin: 12 g/dL (ref 12.0–15.0)
MCH: 29.3 pg (ref 26.0–34.0)
MCHC: 32.4 g/dL (ref 30.0–36.0)
MCV: 90.2 fL (ref 80.0–100.0)
Platelets: 413 10*3/uL — ABNORMAL HIGH (ref 150–400)
RBC: 4.1 MIL/uL (ref 3.87–5.11)
RDW: 14.4 % (ref 11.5–15.5)
WBC: 14.1 10*3/uL — ABNORMAL HIGH (ref 4.0–10.5)
nRBC: 0 % (ref 0.0–0.2)

## 2020-01-20 MED ORDER — ACETAMINOPHEN 500 MG PO TABS
1000.0000 mg | ORAL_TABLET | Freq: Once | ORAL | Status: AC
  Administered 2020-01-20: 1000 mg via ORAL
  Filled 2020-01-20: qty 2

## 2020-01-20 MED ORDER — ONDANSETRON 4 MG PO TBDP
4.0000 mg | ORAL_TABLET | Freq: Once | ORAL | Status: AC
  Administered 2020-01-20: 4 mg via ORAL
  Filled 2020-01-20: qty 1

## 2020-01-20 MED ORDER — MECLIZINE HCL 25 MG PO TABS
25.0000 mg | ORAL_TABLET | Freq: Once | ORAL | Status: AC
  Administered 2020-01-20: 25 mg via ORAL
  Filled 2020-01-20: qty 1

## 2020-01-20 MED ORDER — MECLIZINE HCL 25 MG PO TABS
25.0000 mg | ORAL_TABLET | Freq: Three times a day (TID) | ORAL | 1 refills | Status: DC | PRN
Start: 2020-01-20 — End: 2020-07-13

## 2020-01-20 NOTE — ED Notes (Signed)
Pt discharged  via wheelchair to lobby to wait for son, first nurse notified.

## 2020-01-20 NOTE — ED Notes (Signed)
Pt's son, Annie Main, called for pt's discharge x 3, no answer

## 2020-01-20 NOTE — ED Provider Notes (Signed)
ER Provider Note       Time seen: 5:18 PM    I have reviewed the vital signs and the nursing notes.  HISTORY   Chief Complaint Fall    HPI Tasha Reyes is a 86 y.o. female with a history of acute on chronic respiratory failure, CHF, COPD, hyperlipidemia, hypertension, insomnia, CVA who presents today for a trip and fall that occurred 2 days ago where she hit the back of her head.  She denies loss of consciousness, has had pain in the back of her head as well as nausea.  Currently she describes room spinning sensation.  She denies any recent illness or other complaints.  Past Medical History:  Diagnosis Date  . Acute on chronic respiratory failure ([place]) 05/20/2018  . Aneurysm of iliac artery ([facility]) 05/11/2015  . CHF (congestive heart failure) ([place])   . COPD (chronic obstructive pulmonary disease) ([place])   . Cystitis   . History of DVT (deep vein thrombosis) 05/11/2015  . History of peptic ulcer disease 05/11/2015  . [name]   . Hypertension   . Insomnia   . Losing weight   . Migraines   . [name]   . Rectal bleeding   . Stroke ([place])   . Vascular disease     Past Surgical History:  Procedure Laterality Date  . ABDOMINAL AORTIC ANEURYSM REPAIR W/ ENDOLUMINAL GRAFT  2008  . ABDOMINAL HYSTERECTOMY    . APPENDECTOMY    . BACK SURGERY    . blood clot      removal right leg  . COLONOSCOPY  2012  . ESOPHAGOGASTRODUODENOSCOPY  04/2013   gastric ulcers  . FEMORAL-POPLITEAL BYPASS GRAFT Right 2008  . HIP ARTHROPLASTY Right 06/17/2017   Procedure: ARTHROPLASTY BIPOLAR HIP (HEMIARTHROPLASTY);  Surgeon: Earnestine Leys, MD;  Location: [facility] ORS;  Service: Orthopedics;  Laterality: Right;  . JOINT REPLACEMENT Right 06/19/2017   right hip FX.  Marland Kitchen PARTIAL HYSTERECTOMY      Allergies Other  Review of Systems Constitutional: Negative for fever. Cardiovascular: Negative for chest pain. Respiratory: Negative for shortness of  breath. Gastrointestinal: Negative for abdominal pain, positive for nausea Musculoskeletal: Negative for back pain. Skin: Negative for rash. Neurological: Positive for headache, positive for room spinning sensation  All systems negative/normal/unremarkable except as stated in the HPI  ____________________________________________   PHYSICAL EXAM:  VITAL SIGNS: Vitals:   01/20/20 1252 01/20/20 1626  BP: 137/81 (!) 180/78  Pulse: 80 69  Resp: 18 19  Temp: 98.6 F (37 C)   SpO2: 92% 100%    Constitutional: Alert and oriented.  Chronically ill-appearing, no distress Eyes: Conjunctivae are normal. Normal extraocular movements. ENT      Head: Normocephalic and atraumatic.      Nose: No congestion/rhinnorhea.      Mouth/Throat: Mucous membranes are moist.      Neck: No stridor. Cardiovascular: Normal rate, regular rhythm. No murmurs, rubs, or gallops. Respiratory: Normal respiratory effort without tachypnea nor retractions.  Gastrointestinal: Soft and nontender. Normal bowel sounds Musculoskeletal: Nontender with normal range of motion in extremities. No lower extremity tenderness nor edema. Neurologic:  Normal speech and language. No gross focal neurologic deficits are appreciated.  Skin:  Skin is warm, dry and intact. No rash noted. ____________________________________________  EKG: Interpreted by me.  Sinus rhythm rate of 72 bpm, incomplete right bundle branch block, left anterior fascicular block, normal QT  ____________________________________________   LABS (pertinent positives/negatives)  Labs Reviewed  BASIC METABOLIC PANEL - Abnormal; Notable  for the following components:      Result Value   Chloride 94 (*)    CO2 35 (*)    Glucose, Bld 119 (*)    GFR calc non Af Amer 53 (*)    All other components within normal limits  CBC - Abnormal; Notable for the following components:   WBC 14.1 (*)    Platelets 413 (*)    All other components within normal limits     RADIOLOGY  Images were viewed by me CT head/c-spine IMPRESSION: 1. No acute intracranial abnormality. Stable atrophy and chronic microvascular ischemic changes. 2. No acute cervical spine fracture or traumatic listhesis. Severe cervical spondylosis. 3. Emphysema (ICD10-J43.9).   DIFFERENTIAL DIAGNOSIS  Fall, concussion, head injury, subdural, subarachnoid, fracture  ASSESSMENT AND PLAN  Fall, head injury, vertigo   Plan: The patient had presented for recent fall. Patient's labs were overall reassuring, leukocytosis of uncertain etiology.  CT of the head and C-spine was reassuring.  I did give her meclizine for room spinning sensation.  Patient states otherwise she wants to go home, is no acute distress.  Lenise Arena MD    Note: This note was generated in part or whole with voice recognition software. Voice recognition is usually quite accurate but there are transcription errors that can and very often do occur. I apologize for any typographical errors that were not detected and corrected.     Earleen Newport, MD 01/20/20 [phone]

## 2020-01-20 NOTE — ED Triage Notes (Addendum)
Patient states she tripped and fell 2 days ago and hit the back of her head. Denies LOC. Reports since then has had pain in head and neck as well as nausea. Patient taking plavix

## 2020-01-20 NOTE — Telephone Encounter (Signed)
  Patient called stating that she fell 2 nights ago hitting the back of her head on a chair. She states she was fine and slept through the night.  Today she is calling because she states that she gets dizzy has a headache and vomits when she stands up. Symptoms kept her up all night.  She takes a blood thinner. She is unsure if she has a abrasion to the area but states she had no bleeding.She is unsure that she has a lump on her head. Patient was advised that she needs to go to ER for evaluation of her injury. Care advice was read to patient.  She verbalized understanding. Patient states her son will take her to ER. I offered to contact her son but patient refused.  EMS was offered. She refused.  Reason for Disposition . Vomiting once or more  Answer Assessment - Initial Assessment Questions 1. MECHANISM: "How did the injury happen?" For falls, ask: "What height did you fall from?" and "What surface did you fall against?"    Fell down a step hit back of head 2. ONSET: "When did the injury happen?" (Minutes or hours ago)      2 days 3. NEUROLOGIC SYMPTOMS: "Was there any loss of consciousness?" "Are there any other neurological symptoms?"      Headache dizziness vomiting 4. MENTAL STATUS: "Does the person know who he is, who you are, and where he is?"     no 5. LOCATION: "What part of the head was hit?"     Back of head 6. SCALP APPEARANCE: "What does the scalp look like? Is it bleeding now?" If Yes, ask: "Is it difficult to stop?"      no 7. SIZE: For cuts, bruises, or swelling, ask: "How large is it?" (e.g., inches or centimeters)      unsure 8. PAIN: "Is there any pain?" If Yes, ask: "How bad is it?"  (e.g., Scale 1-10; or mild, moderate, severe)     5 9. TETANUS: For any breaks in the skin, ask: "When was the last tetanus booster?"     10. OTHER SYMPTOMS: "Do you have any other symptoms?" (e.g., neck pain, vomiting)      Vomiting head and neck pain  11. PREGNANCY: "Is there any chance you  are pregnant?" "When was your last menstrual period?"       N/A  Protocols used: HEAD INJURY-A-AH

## 2020-01-20 NOTE — Telephone Encounter (Signed)
Noted  KP 

## 2020-01-20 NOTE — ED Notes (Signed)
Pt's son Annie Main to pick up pt for discharge.

## 2020-01-20 NOTE — ED Notes (Signed)
Pt's son Annie Main reached, notified that pt is ready for discharge. Call lost, will call back

## 2020-01-29 ENCOUNTER — Other Ambulatory Visit: Payer: Self-pay | Admitting: Internal Medicine

## 2020-01-29 DIAGNOSIS — J449 Chronic obstructive pulmonary disease, unspecified: Secondary | ICD-10-CM

## 2020-01-29 NOTE — Telephone Encounter (Signed)
Requested Prescriptions  Pending Prescriptions Disp Refills  . albuterol (PROVENTIL) (2.5 MG/3ML) 0.083% nebulizer solution [Pharmacy Med Name: ALBUTEROL 0.083%(2.5MG /3ML) 25X3ML] 75 mL 5    Sig: USE 1 VIAL VIA NEBULIZER EVERY 8 HOURS     Pulmonology:  Beta Agonists Failed - 01/29/2020  7:37 AM      Failed - One inhaler should last at least one month. If the patient is requesting refills earlier, contact the patient to check for uncontrolled symptoms.      Passed - Valid encounter within last 12 months    Recent Outpatient Visits          2 months ago Unspecified inflammatory spondylopathy, lumbar region [facility])   [facility] Glean Hess, MD   1 year ago Community acquired pneumonia of right lower lobe of lung [facility])   [facility] Glean Hess, MD   1 year ago Community acquired pneumonia of right lower lobe of lung [facility])   [facility] Glean Hess, MD   2 years ago Chronic obstructive pulmonary disease, unspecified COPD type [facility])   [facility] Glean Hess, MD   2 years ago Essential (primary) hypertension   [facility] Glean Hess, MD      Future Appointments            In 3 months Army Melia Jesse Sans, MD [facility]

## 2020-02-15 ENCOUNTER — Other Ambulatory Visit: Payer: Self-pay | Admitting: Internal Medicine

## 2020-02-15 DIAGNOSIS — I5032 Chronic diastolic (congestive) heart failure: Secondary | ICD-10-CM

## 2020-04-03 ENCOUNTER — Other Ambulatory Visit: Payer: Self-pay | Admitting: Internal Medicine

## 2020-04-03 DIAGNOSIS — R112 Nausea with vomiting, unspecified: Secondary | ICD-10-CM

## 2020-04-03 NOTE — Telephone Encounter (Signed)
Requested medication (s) are due for refill today: yes   Requested medication (s) are on the active medication list: yes   Last refill:  start: 11/17/19 end: 12/02/19 #30 0 refills   Future visit scheduled: no  Notes to clinic:  not delegated per protocol     Requested Prescriptions  Pending Prescriptions Disp Refills   ondansetron (ZOFRAN-ODT) 8 MG disintegrating tablet [Pharmacy Med Name: ONDANSETRON ODT 8MG  TABLETS] 30 tablet 0    Sig: DISSOLVE 1 TABLET(8 MG) ON THE TONGUE TWICE DAILY FOR 15 DAYS      Not Delegated - Gastroenterology: Antiemetics Failed - 04/03/2020 11:55 AM      Failed - This refill cannot be delegated      Passed - Valid encounter within last 6 months    Recent Outpatient Visits           4 months ago Unspecified inflammatory spondylopathy, lumbar region [facility])   [facility] Glean Hess, MD   1 year ago Community acquired pneumonia of right lower lobe of lung [facility])   [facility] Glean Hess, MD   1 year ago Community acquired pneumonia of right lower lobe of lung [facility])   [facility] Glean Hess, MD   2 years ago Chronic obstructive pulmonary disease, unspecified COPD type [facility])   [facility] Glean Hess, MD   2 years ago Essential (primary) hypertension   [facility] Glean Hess, MD

## 2020-04-16 NOTE — Progress Notes (Signed)
Patient: Tasha Reyes  Service Category: Tasha/M  Provider: Gaspar Cola, MD  DOB: 05-Mar-1931  DOS: 04/17/2020  Location: Office  MRN: [id number]  Setting: Ambulatory outpatient  Referring Provider: Glean Hess, MD  Type: Established Patient  Specialty: Interventional Pain Management  PCP: Glean Hess, MD  Location: Remote location  Delivery: TeleHealth     Virtual Encounter - Pain Management PROVIDER NOTE: Information contained herein reflects review and annotations entered in association with encounter. Interpretation of such information and data should be left to medically-trained personnel. Information provided to patient can be located elsewhere in the medical record under "Patient Instructions". Document created using STT-dictation technology, any transcriptional errors that may result from process are unintentional.    Contact & Pharmacy Preferred: [phone] Home: [phone] (home) Mobile: There is no such number on file (mobile). Tasha-mail: No Tasha-mail address on record  [id number] [place] [id number]: [phone]: [phone]  Pre-screening  Tasha Reyes offered "in-person" vs "virtual" encounter. She indicated preferring virtual for this encounter.   Reason COVID-19*   Social distancing based on CDC and AMA recommendations.   I contacted Tasha SCHMALTZon 04/17/2020 via telephone.      I clearly identified myself as FGaspar Cola MD. I verified that I was speaking with the correct person using two identifiers (Name: Tasha Reyes and date of birth: 108-Dec-1932.  Consent I sought verbal advanced consent from B[place]ley Canalfor virtual visit interactions. I informed Tasha Reyes of possible security and privacy concerns, risks, and limitations associated with providing "not-in-person" medical evaluation and management services. I also informed Tasha Reyes of the availability  of "in-person" appointments. Finally, I informed her that there would be a charge for the virtual visit and that she could be  personally, fully or partially, financially responsible for it. Ms. EMunarexpressed understanding and agreed to proceed.   Historic Elements   Ms. Tasha TURKis a 86y.o. year old, female patient evaluated today after our last contact on Visit date not found. Ms. Tasha Reyes has a past medical history of Acute on chronic respiratory failure ([place] (05/20/2018), Aneurysm of iliac artery ([place] (05/11/2015), CHF (congestive heart failure) ([place], COPD (chronic obstructive pulmonary disease) ([place], Cystitis, History of DVT (deep vein thrombosis) (05/11/2015), History of peptic ulcer disease (05/11/2015), [name], Hypertension, Insomnia, Losing weight, Migraines, [name], Rectal bleeding, Stroke ([place], [name]; [name]; [name], 2008); Back surgery; Colonoscopy (2012); Esophagogastroduodenoscopy (04/2013); Abdominal hysterectomy; Appendectomy; blood clot ; Hip Arthroplasty (Right, 06/17/2017); and Joint replacement (Right, 06/19/2017). Tasha Reyes a current medication list which includes the following prescription(s): albuterol, atenolol, atorvastatin, clopidogrel, furosemide, hydrocodone-acetaminophen, [START ON 04/21/2020] hydrocodone-acetaminophen, hydroxyzine, meclizine, ondansetron, oxygen-helium, and pantoprazole. She  reports that she has been smoking cigarettes. She has a 6.10 pack-year smoking history. She has never used smokeless tobacco. She reports that she does not drink alcohol and does not use drugs. Ms. ELinamis allergic to other.   HPI  Today, she is being contacted for medication management.  The patient was initially scheduled to come in for a face-to-face  visit, but she indicates that last night she fell in the shower and today she can hardly move.  I asked her if she had gone to the emergency room and  she indicated that she had not.  I have recommended that she at least go to an urgent care and be checked because that her age (86 years old) it would be very easy for her to end up with a fracture.  While I was speaking to her on the phone, she did not sound normal.  She sounded as if she had a swollen lip.  When I called today the phone was answered by her son, who apparently was with her at home.  Today I have informed the patient that I will be supplying her with enough medication to last for approximately 2 weeks at which time I would like to see her back on a face-to-face visit.  She understood and agree.  According to their PMP she had her last prescription filled on 03/22/20.   Pharmacotherapy Assessment  Analgesic: Hydrocodone/APAP 5/325 mg, 1 tab PO q 8 hrs (15 mg/day of hydrocodone) MME/day:15 mg/day.   Monitoring: [place] PMP: PDMP reviewed during this encounter.       Pharmacotherapy: No side-effects or adverse reactions reported. Compliance: No problems identified. Effectiveness: Clinically acceptable. Plan: Refer to "POC".  UDS:  Summary  Date Value Ref Range Status  10/21/2017 FINAL  Final    Comment:    ==================================================================== TOXASSURE SELECT 13 (MW) ==================================================================== Test                             Result       Flag       Units Drug Present not Declared for Prescription Verification   Hydrocodone                    1014         UNEXPECTED ng/mg creat   Hydromorphone                  211          UNEXPECTED ng/mg creat   Dihydrocodeine                 221          UNEXPECTED ng/mg creat   Norhydrocodone                 1450         UNEXPECTED ng/mg creat    Sources of hydrocodone include scheduled prescription    medications.  Hydromorphone, dihydrocodeine and norhydrocodone are    expected metabolites of hydrocodone. Hydromorphone and    dihydrocodeine are also available as scheduled prescription    medications. ==================================================================== Test                      Result    Flag   Units      Ref Range   Creatinine              180              mg/dL      >=20 ==================================================================== Declared Medications:  The flagging and interpretation on this report are based on the  following declared medications.  Unexpected results may arise from  inaccuracies in the declared medications.  **Note: The testing scope of this panel does not include following  reported medications:  Atenolol  Atorvastatin  Cholecalciferol  Clopidogrel  Iron (Ferrous Sulfate)  Methocarbamol  Polyethylene Glycol  Prednisone ==================================================================== For clinical consultation, please call [phone]. ====================================================================     Laboratory  Chemistry Profile   Renal Lab Results  Component Value Date   BUN 14 01/20/2020   CREATININE 0.96 01/20/2020   BCR 15 11/17/2019   GFRAA >60 01/20/2020   GFRNONAA 53 (L) 01/20/2020     Hepatic Lab Results  Component Value Date   AST 20 11/17/2019   ALT 11 11/17/2019   ALBUMIN 4.3 11/17/2019   ALKPHOS 83 11/17/2019     Electrolytes Lab Results  Component Value Date   NA 141 01/20/2020   K 4.5 01/20/2020   CL 94 (L) 01/20/2020   CALCIUM 8.9 01/20/2020   MG 1.8 01/20/2018     Bone Lab Results  Component Value Date   VD25OH 5.0 (L) 01/08/2016   25OHVITD1 41 01/20/2018   25OHVITD2 13 01/20/2018   25OHVITD3 28 01/20/2018     Inflammation (CRP: Acute Phase) (ESR: Chronic Phase) Lab Results  Component Value Date   CRP 5 01/20/2018   ESRSEDRATE 23 01/20/2018       Note: Above Lab results  reviewed.  Imaging  CT Cervical Spine Wo Contrast CLINICAL DATA:  Tripped and fell 2 days ago.  EXAM: CT HEAD WITHOUT CONTRAST  CT CERVICAL SPINE WITHOUT CONTRAST  TECHNIQUE: Multidetector CT imaging of the head and cervical spine was performed following the standard protocol without intravenous contrast. Multiplanar CT image reconstructions of the cervical spine were also generated.  COMPARISON:  CT head dated May 20, 2018.  FINDINGS: CT HEAD FINDINGS  Brain: No evidence of acute infarction, hemorrhage, hydrocephalus, extra-axial collection or mass lesion/mass effect. Stable atrophy and chronic microvascular ischemic changes.  Vascular: Calcified atherosclerosis at the skullbase. No hyperdense vessel.  Skull: Normal. Negative for fracture or focal lesion.  Sinuses/Orbits: No acute finding.  Other: None.  CT CERVICAL SPINE FINDINGS  Alignment: No traumatic malalignment. Mild reversal of the normal cervical lordosis.  Skull base and vertebrae: No acute fracture. No primary bone lesion or focal pathologic process.  Soft tissues and spinal Reyes: No prevertebral fluid or swelling. No visible Reyes hematoma.  Disc levels: Severe disc height loss and mild to moderate uncovertebral hypertrophy from C3-C4 through C6-C7.  Upper chest: Moderate centrilobular emphysema.  Other: None.  IMPRESSION: 1. No acute intracranial abnormality. Stable atrophy and chronic microvascular ischemic changes. 2. No acute cervical spine fracture or traumatic listhesis. Severe cervical spondylosis. 3. Emphysema (ICD10-J43.9).  Electronically Signed   By: Titus Dubin M.D.   On: 01/20/2020 14:16 CT HEAD WO CONTRAST CLINICAL DATA:  Tripped and fell 2 days ago.  EXAM: CT HEAD WITHOUT CONTRAST  CT CERVICAL SPINE WITHOUT CONTRAST  TECHNIQUE: Multidetector CT imaging of the head and cervical spine was performed following the standard protocol without intravenous contrast.  Multiplanar CT image reconstructions of the cervical spine were also generated.  COMPARISON:  CT head dated May 20, 2018.  FINDINGS: CT HEAD FINDINGS  Brain: No evidence of acute infarction, hemorrhage, hydrocephalus, extra-axial collection or mass lesion/mass effect. Stable atrophy and chronic microvascular ischemic changes.  Vascular: Calcified atherosclerosis at the skullbase. No hyperdense vessel.  Skull: Normal. Negative for fracture or focal lesion.  Sinuses/Orbits: No acute finding.  Other: None.  CT CERVICAL SPINE FINDINGS  Alignment: No traumatic malalignment. Mild reversal of the normal cervical lordosis.  Skull base and vertebrae: No acute fracture. No primary bone lesion or focal pathologic process.  Soft tissues and spinal Reyes: No prevertebral fluid or swelling. No visible Reyes hematoma.  Disc levels: Severe disc height loss and mild to moderate uncovertebral hypertrophy from C3-C4  through C6-C7.  Upper chest: Moderate centrilobular emphysema.  Other: None.  IMPRESSION: 1. No acute intracranial abnormality. Stable atrophy and chronic microvascular ischemic changes. 2. No acute cervical spine fracture or traumatic listhesis. Severe cervical spondylosis. 3. Emphysema (ICD10-J43.9).  Electronically Signed   By: Titus Dubin M.D.   On: 01/20/2020 14:16  Assessment  The primary encounter diagnosis was Chronic pain syndrome. Diagnoses of Chronic lower extremity pain (Primary Area of Pain) (Right), Chronic low back pain (Secondary area of Pain) (Bilateral) w/ sciatica (Bilateral) (R>L), and Pharmacologic therapy were also pertinent to this visit.  Plan of Care  Problem-specific:  No problem-specific Assessment & Plan notes found for this encounter.  Ms. LUN MURO has a current medication list which includes the following long-term medication(s): albuterol, atenolol, atorvastatin, furosemide, hydrocodone-acetaminophen, [START ON  04/21/2020] hydrocodone-acetaminophen, ondansetron, and pantoprazole.  Pharmacotherapy (Medications Ordered): Meds ordered this encounter  Medications   HYDROcodone-acetaminophen (NORCO/VICODIN) 5-325 MG tablet    Sig: Take 1 tablet by mouth every 8 (eight) hours as needed for up to 15 days for severe pain. Must last 15 days    Dispense:  45 tablet    Refill:  0    Chronic Pain: STOP Act (Not applicable) Fill 1 day early if closed on refill date. Avoid benzodiazepines within 8 hours of opioids   Orders:  No orders of the defined types were placed in this encounter.  Follow-up plan:   Return in about 2 weeks (around 05/01/2020) for (F2F), (Med Mgmt).      Interventional therapies: Planned, scheduled, and/or pending:   Not at this time.   Considering:   NOTE: PLAVIX ANTICOAGULATION (Stop: 7-10 days   Restart: 2 hors) Diagnostic right L4 TFESI  Diagnostic right L4-5 LESI  Diagnostic bilateral lumbar facet block  Possible bilateral lumbar facet RFA    Palliative PRN treatment(s):   Diagnostic right L4 TFESI Diagnostic right L4-5 LESI  Diagnostic bilateral lumbar facet block      Recent Visits No visits were found meeting these conditions. Showing recent visits within past 90 days and meeting all other requirements Today's Visits Date Type Provider Dept  04/17/20 Telemedicine Milinda Pointer, MD [facility]  Showing today's visits and meeting all other requirements Future Appointments No visits were found meeting these conditions. Showing future appointments within next 90 days and meeting all other requirements  I discussed the assessment and treatment plan with the patient. The patient was provided an opportunity to ask questions and all were answered. The patient agreed with the plan and demonstrated an understanding of the instructions.  Patient advised to call back or seek an in-person evaluation if the symptoms or condition worsens.  Duration of  encounter: 13 minutes.  Note by: Gaspar Cola, MD Date: 04/17/2020; Time: 10:22 AM

## 2020-04-17 ENCOUNTER — Other Ambulatory Visit: Payer: Self-pay

## 2020-04-17 ENCOUNTER — Encounter: Payer: Self-pay | Admitting: Pain Medicine

## 2020-04-17 ENCOUNTER — Ambulatory Visit: Payer: Medicare Other | Attending: Pain Medicine | Admitting: Pain Medicine

## 2020-04-17 DIAGNOSIS — M79604 Pain in right leg: Secondary | ICD-10-CM | POA: Diagnosis not present

## 2020-04-17 DIAGNOSIS — Z79899 Other long term (current) drug therapy: Secondary | ICD-10-CM

## 2020-04-17 DIAGNOSIS — G8929 Other chronic pain: Secondary | ICD-10-CM

## 2020-04-17 DIAGNOSIS — M5442 Lumbago with sciatica, left side: Secondary | ICD-10-CM

## 2020-04-17 DIAGNOSIS — G894 Chronic pain syndrome: Secondary | ICD-10-CM | POA: Diagnosis not present

## 2020-04-17 DIAGNOSIS — M5441 Lumbago with sciatica, right side: Secondary | ICD-10-CM

## 2020-04-17 MED ORDER — HYDROCODONE-ACETAMINOPHEN 5-325 MG PO TABS: 1.0000 | ORAL_TABLET | Freq: Three times a day (TID) | ORAL | 0 refills | Status: DC | PRN

## 2020-05-02 NOTE — Progress Notes (Signed)
PROVIDER NOTE: Information contained herein reflects review and annotations entered in association with encounter. Interpretation of such information and data should be left to medically-trained personnel. Information provided to patient can be located elsewhere in the medical record under "Patient Instructions". Document created using STT-dictation technology, any transcriptional errors that may result from process are unintentional.    Patient: Tasha Reyes  Service Category: E/M  Provider: Gaspar Cola, MD  DOB: 1930/10/07  DOS: 05/03/2020  Specialty: Interventional Pain Management  MRN: [id number]  Setting: Ambulatory outpatient  PCP: Glean Hess, MD  Type: Established Patient    Referring Provider: Glean Hess, MD  Location: Office  Delivery: Face-to-face     HPI  Ms. Tasha Reyes, a 86 y.o. year old female, is here today because of her Chronic pain syndrome [G89.4]. Ms. Tasha Reyes primary complain today is Back Pain Last encounter: My last encounter with her was on Visit date not found. Pertinent problems: Ms. Tasha Reyes has Restless leg; Chronic tension-type headache, intractable; Chronic lower extremity pain (Primary Area of Pain) (Right); Lumbosacral Radiculopathy (Right); Chronic radicular pain of lower extremity (Right); Peripheral vascular disease ([place]); Lower extremity pain (Right); Neurogenic pain; Lumbar facet syndrome (Bilateral); Lumbar foraminal stenosis (Right) (Severe at L4-5); Complaints of weakness of lower extremities (Bilateral); History of repair of hip fracture; Chronic hip pain (Right); Chronic lower extremity pain (Bilateral) (R>L); DDD (degenerative disc disease), lumbar; Chronic low back pain (Secondary area of Pain) (Bilateral) w/ sciatica (Bilateral) (R>L); Bilateral leg numbness; Chronic pain syndrome; and Chronic musculoskeletal pain on their pertinent problem list. Pain Assessment: Severity of   is reported as a 5 /10. Location: Back Right,  Left/The pain goes down the leg to both feet with more swelling on the left.. Onset: More than a month ago. Quality: Constant, Radiating, Burning, Sharp. Timing: Constant. Modifying factor(s): laying down.. Vitals:  height is '5\' 6"'  (1.676 m) and weight is 81 lb (36.7 kg). Her temporal temperature is 97.2 F (36.2 C) (abnormal). Her blood pressure is 115/73 and her pulse is 104 (abnormal). Her respiration is 18 and oxygen saturation is 89% (abnormal).   Reason for encounter: medication management.  The patient indicates doing well with the current medication regimen. No adverse reactions or side effects reported to the medications.  The patient comes into the clinic today in a wheelchair and she is quite malnourished.  She has lost weight since I last saw her.  According to the medical record her current weight is 81 pounds.  She has a BMI of 13.07 kg/m.  I asked the patient what was to deal with this and she indicated that she recently lost her granddaughter and she thinks that she is still grieving since she has lost her appetite and she is not eating well.  I encouraged her to talk to her primary care physician to see if there is anything that they can do in terms of improving her nutrition. RTCB: 08/04/2020  Pharmacotherapy Assessment   Analgesic: Hydrocodone/APAP 5/325 mg, 1 tab PO q 8 hrs (15 mg/day of hydrocodone) MME/day:15 mg/day.   Monitoring: [place] PMP: PDMP reviewed during this encounter.       Pharmacotherapy: No side-effects or adverse reactions reported. Compliance: No problems identified. Effectiveness: Clinically acceptable.  Charna Busman, NT  05/03/2020  2:07 PM  Sign when Signing Visit Safety precautions to be maintained throughout the outpatient stay will include: orient to surroundings, keep bed in low position, maintain call bell within reach at all times, provide assistance  with transfer out of bed and ambulation.     UDS:  Summary  Date Value Ref Range Status  10/21/2017  FINAL  Final    Comment:    ==================================================================== TOXASSURE SELECT 13 (MW) ==================================================================== Test                             Result       Flag       Units Drug Present not Declared for Prescription Verification   Hydrocodone                    1014         UNEXPECTED ng/mg creat   Hydromorphone                  211          UNEXPECTED ng/mg creat   Dihydrocodeine                 221          UNEXPECTED ng/mg creat   Norhydrocodone                 1450         UNEXPECTED ng/mg creat    Sources of hydrocodone include scheduled prescription    medications. Hydromorphone, dihydrocodeine and norhydrocodone are    expected metabolites of hydrocodone. Hydromorphone and    dihydrocodeine are also available as scheduled prescription    medications. ==================================================================== Test                      Result    Flag   Units      Ref Range   Creatinine              180              mg/dL      >=20 ==================================================================== Declared Medications:  The flagging and interpretation on this report are based on the  following declared medications.  Unexpected results may arise from  inaccuracies in the declared medications.  **Note: The testing scope of this panel does not include following  reported medications:  Atenolol  Atorvastatin  Cholecalciferol  Clopidogrel  Iron (Ferrous Sulfate)  Methocarbamol  Polyethylene Glycol  Prednisone ==================================================================== For clinical consultation, please call [phone]. ====================================================================      ROS  Constitutional: Denies any fever or chills Gastrointestinal: No reported hemesis, hematochezia, vomiting, or acute GI distress Musculoskeletal: Denies any acute onset joint  swelling, redness, loss of ROM, or weakness Neurological: No reported episodes of acute onset apraxia, aphasia, dysarthria, agnosia, amnesia, paralysis, loss of coordination, or loss of consciousness  Medication Review  HYDROcodone-acetaminophen, [name], albuterol, atenolol, atorvastatin, clopidogrel, furosemide, hydrOXYzine, meclizine, ondansetron, and pantoprazole  History Review  Allergy: Ms. Tasha Reyes is allergic to other. Drug: Ms. Tasha Reyes  reports no history of drug use. Alcohol:  reports no history of alcohol use. Tobacco:  reports that she has been smoking cigarettes. She has a 6.10 pack-year smoking history. She has never used smokeless tobacco. Social: Ms. Tasha Reyes  reports that she has been smoking cigarettes. She has a 6.10 pack-year smoking history. She has never used smokeless tobacco. She reports that she does not drink alcohol and does not use drugs. Medical:  has a past medical history of Acute on chronic respiratory failure ([place]) (05/20/2018), Aneurysm of iliac artery ([facility]) (05/11/2015), CHF (congestive heart failure) ([place]), COPD (chronic obstructive  pulmonary disease) ([place]), Cystitis, History of DVT (deep vein thrombosis) (05/11/2015), History of peptic ulcer disease (05/11/2015), [name], Hypertension, Insomnia, Losing weight, Migraines, [name], Rectal bleeding, Stroke ([place]), and Vascular disease. Surgical: Ms. Tasha Reyes  has a past surgical history that includes Partial hysterectomy; [name]; [name], 2008); Back surgery; Colonoscopy (2012); Esophagogastroduodenoscopy (04/2013); Abdominal hysterectomy; Appendectomy; blood clot ; Hip Arthroplasty (Right, 06/17/2017); and Joint replacement (Right, 06/19/2017). Family: family history includes Brain cancer in her father; Colon cancer in her brother; Kidney disease in her mother.  Laboratory Chemistry Profile    Renal Lab Results  Component Value Date   BUN 14 01/20/2020   CREATININE 0.96 01/20/2020   BCR 15 11/17/2019   GFRAA >60 01/20/2020   GFRNONAA 53 (L) 01/20/2020     Hepatic Lab Results  Component Value Date   AST 20 11/17/2019   ALT 11 11/17/2019   ALBUMIN 4.3 11/17/2019   ALKPHOS 83 11/17/2019     Electrolytes Lab Results  Component Value Date   NA 141 01/20/2020   K 4.5 01/20/2020   CL 94 (L) 01/20/2020   CALCIUM 8.9 01/20/2020   MG 1.8 01/20/2018     Bone Lab Results  Component Value Date   VD25OH 5.0 (L) 01/08/2016   25OHVITD1 41 01/20/2018   25OHVITD2 13 01/20/2018   25OHVITD3 28 01/20/2018     Inflammation (CRP: Acute Phase) (ESR: Chronic Phase) Lab Results  Component Value Date   CRP 5 01/20/2018   ESRSEDRATE 23 01/20/2018       Note: Above Lab results reviewed.  Recent Imaging Review  CT Cervical Spine Wo Contrast CLINICAL DATA:  Tripped and fell 2 days ago.  EXAM: CT HEAD WITHOUT CONTRAST  CT CERVICAL SPINE WITHOUT CONTRAST  TECHNIQUE: Multidetector CT imaging of the head and cervical spine was performed following the standard protocol without intravenous contrast. Multiplanar CT image reconstructions of the cervical spine were also generated.  COMPARISON:  CT head dated May 20, 2018.  FINDINGS: CT HEAD FINDINGS  Brain: No evidence of acute infarction, hemorrhage, hydrocephalus, extra-axial collection or mass lesion/mass effect. Stable atrophy and chronic microvascular ischemic changes.  Vascular: Calcified atherosclerosis at the skullbase. No hyperdense vessel.  Skull: Normal. Negative for fracture or focal lesion.  Sinuses/Orbits: No acute finding.  Other: None.  CT CERVICAL SPINE FINDINGS  Alignment: No traumatic malalignment. Mild reversal of the normal cervical lordosis.  Skull base and vertebrae: No acute fracture. No primary bone lesion or focal pathologic process.  Soft tissues and spinal Reyes: No  prevertebral fluid or swelling. No visible Reyes hematoma.  Disc levels: Severe disc height loss and mild to moderate uncovertebral hypertrophy from C3-C4 through C6-C7.  Upper chest: Moderate centrilobular emphysema.  Other: None.  IMPRESSION: 1. No acute intracranial abnormality. Stable atrophy and chronic microvascular ischemic changes. 2. No acute cervical spine fracture or traumatic listhesis. Severe cervical spondylosis. 3. Emphysema (ICD10-J43.9).  Electronically Signed   By: Titus Dubin M.D.   On: 01/20/2020 14:16 CT HEAD WO CONTRAST CLINICAL DATA:  Tripped and fell 2 days ago.  EXAM: CT HEAD WITHOUT CONTRAST  CT CERVICAL SPINE WITHOUT CONTRAST  TECHNIQUE: Multidetector CT imaging of the head and cervical spine was performed following the standard protocol without intravenous contrast. Multiplanar CT image reconstructions of the cervical spine were also generated.  COMPARISON:  CT head dated May 20, 2018.  FINDINGS: CT HEAD FINDINGS  Brain: No evidence of acute infarction, hemorrhage,  hydrocephalus, extra-axial collection or mass lesion/mass effect. Stable atrophy and chronic microvascular ischemic changes.  Vascular: Calcified atherosclerosis at the skullbase. No hyperdense vessel.  Skull: Normal. Negative for fracture or focal lesion.  Sinuses/Orbits: No acute finding.  Other: None.  CT CERVICAL SPINE FINDINGS  Alignment: No traumatic malalignment. Mild reversal of the normal cervical lordosis.  Skull base and vertebrae: No acute fracture. No primary bone lesion or focal pathologic process.  Soft tissues and spinal Reyes: No prevertebral fluid or swelling. No visible Reyes hematoma.  Disc levels: Severe disc height loss and mild to moderate uncovertebral hypertrophy from C3-C4 through C6-C7.  Upper chest: Moderate centrilobular emphysema.  Other: None.  IMPRESSION: 1. No acute intracranial abnormality. Stable atrophy and  chronic microvascular ischemic changes. 2. No acute cervical spine fracture or traumatic listhesis. Severe cervical spondylosis. 3. Emphysema (ICD10-J43.9).  Electronically Signed   By: Titus Dubin M.D.   On: 01/20/2020 14:16 Note: Reviewed        Physical Exam  General appearance: Well nourished, well developed, and well hydrated. In no apparent acute distress Mental status: Alert, oriented x 3 (person, place, & time)       Respiratory: No evidence of acute respiratory distress Eyes: PERLA Vitals: BP 115/73 (BP Location: Right Arm, Patient Position: Sitting, Cuff Size: Normal)   Pulse (!) 104   Temp (!) 97.2 F (36.2 C) (Temporal)   Resp 18   Ht '5\' 6"'  (1.676 m)   Wt 81 lb (36.7 kg)   SpO2 (!) 89%   BMI 13.07 kg/m  BMI: Estimated body mass index is 13.07 kg/m as calculated from the following:   Height as of this encounter: '5\' 6"'  (1.676 m).   Weight as of this encounter: 81 lb (36.7 kg). Ideal: Female patients must weigh at least 45.5 kg to calculate ideal body weight  Assessment   Status Diagnosis  Controlled Controlled Controlled 1. Chronic pain syndrome   2. Chronic lower extremity pain (Primary Area of Pain) (Right)   3. Chronic low back pain (Secondary area of Pain) (Bilateral) w/ sciatica (Bilateral) (R>L)   4. Pharmacologic therapy      Updated Problems: No problems updated.  Plan of Care  Problem-specific:  No problem-specific Assessment & Plan notes found for this encounter.  Ms. Tasha Reyes has a current medication list which includes the following long-term medication(s): albuterol, atenolol, atorvastatin, furosemide, [START ON 05/06/2020] hydrocodone-acetaminophen, [START ON 06/05/2020] hydrocodone-acetaminophen, [START ON 07/05/2020] hydrocodone-acetaminophen, ondansetron, and pantoprazole.  Pharmacotherapy (Medications Ordered): Meds ordered this encounter  Medications  . HYDROcodone-acetaminophen (NORCO/VICODIN) 5-325 MG tablet    Sig:  Take 1 tablet by mouth every 8 (eight) hours as needed for severe pain. Must last 15 days    Dispense:  90 tablet    Refill:  0    Chronic Pain: STOP Act (Not applicable) Fill 1 day early if closed on refill date. Avoid benzodiazepines within 8 hours of opioids  . HYDROcodone-acetaminophen (NORCO/VICODIN) 5-325 MG tablet    Sig: Take 1 tablet by mouth every 8 (eight) hours as needed for severe pain. Must last 15 days    Dispense:  90 tablet    Refill:  0    Chronic Pain: STOP Act (Not applicable) Fill 1 day early if closed on refill date. Avoid benzodiazepines within 8 hours of opioids  . HYDROcodone-acetaminophen (NORCO/VICODIN) 5-325 MG tablet    Sig: Take 1 tablet by mouth every 8 (eight) hours as needed for severe pain. Must last 15 days  Dispense:  90 tablet    Refill:  0    Chronic Pain: STOP Act (Not applicable) Fill 1 day early if closed on refill date. Avoid benzodiazepines within 8 hours of opioids   Orders:  Orders Placed This Encounter  Procedures  . ToxASSURE Select 13 (MW), Urine    Volume: 30 ml(s). Minimum 3 ml of urine is needed. Document temperature of fresh sample. Indications: Long term (current) use of opiate analgesic (I50.388)    Order Specific Question:   Release to patient    Answer:   Immediate   Follow-up plan:   Return in about 3 months (around 08/04/2020) for (F2F), (Med Mgmt).      Interventional therapies: Planned, scheduled, and/or pending:   Not at this time.   Considering:   NOTE: [facility]VIX ANTICOAGULATION (Stop: 7-10 days  Restart: 2 hors) Diagnostic right L4 TFESI  Diagnostic right L4-5 LESI  Diagnostic bilateral lumbar facet block  Possible bilateral lumbar facet RFA    Palliative PRN treatment(s):   Diagnostic right L4 TFESI Diagnostic right L4-5 LESI  Diagnostic bilateral lumbar facet block       Recent Visits Date Type Provider Dept  04/17/20 Telemedicine Milinda Pointer, MD [facility]  Showing recent visits within  past 90 days and meeting all other requirements Today's Visits Date Type Provider Dept  05/03/20 Office Visit Milinda Pointer, MD [facility]  Showing today's visits and meeting all other requirements Future Appointments No visits were found meeting these conditions. Showing future appointments within next 90 days and meeting all other requirements  I discussed the assessment and treatment plan with the patient. The patient was provided an opportunity to ask questions and all were answered. The patient agreed with the plan and demonstrated an understanding of the instructions.  Patient advised to call back or seek an in-person evaluation if the symptoms or condition worsens.  Duration of encounter: 30 minutes.  Note by: Gaspar Cola, MD Date: 05/03/2020; Time: 2:55 PM

## 2020-05-03 ENCOUNTER — Other Ambulatory Visit: Payer: Self-pay

## 2020-05-03 ENCOUNTER — Ambulatory Visit: Payer: Medicare Other | Attending: Pain Medicine | Admitting: Pain Medicine

## 2020-05-03 ENCOUNTER — Encounter: Payer: Self-pay | Admitting: Pain Medicine

## 2020-05-03 VITALS — BP 115/73 | HR 104 | Temp 97.2°F | Resp 18 | Ht 66.0 in | Wt 81.0 lb

## 2020-05-03 DIAGNOSIS — Z79899 Other long term (current) drug therapy: Secondary | ICD-10-CM

## 2020-05-03 DIAGNOSIS — M5442 Lumbago with sciatica, left side: Secondary | ICD-10-CM | POA: Diagnosis not present

## 2020-05-03 DIAGNOSIS — G894 Chronic pain syndrome: Secondary | ICD-10-CM | POA: Diagnosis not present

## 2020-05-03 DIAGNOSIS — M5441 Lumbago with sciatica, right side: Secondary | ICD-10-CM

## 2020-05-03 DIAGNOSIS — M79604 Pain in right leg: Secondary | ICD-10-CM | POA: Insufficient documentation

## 2020-05-03 DIAGNOSIS — G8929 Other chronic pain: Secondary | ICD-10-CM | POA: Insufficient documentation

## 2020-05-03 MED ORDER — HYDROCODONE-ACETAMINOPHEN 5-325 MG PO TABS: 1.0000 | ORAL_TABLET | Freq: Three times a day (TID) | ORAL | 0 refills | Status: DC | PRN

## 2020-05-03 NOTE — Progress Notes (Signed)
Safety precautions to be maintained throughout the outpatient stay will include: orient to surroundings, keep bed in low position, maintain call bell within reach at all times, provide assistance with transfer out of bed and ambulation.  

## 2020-05-03 NOTE — Patient Instructions (Signed)
____________________________________________________________________________________________  Medication Rules  Purpose: To inform patients, and their family members, of our rules and regulations.  Applies to: All patients receiving prescriptions (written or electronic).  Pharmacy of record: Pharmacy where electronic prescriptions will be sent. If written prescriptions are taken to a different pharmacy, please inform the nursing staff. The pharmacy listed in the electronic medical record should be the one where you would like electronic prescriptions to be sent.  Electronic prescriptions: In compliance with the [place] (STOP) Act of 2017 (Session Lanny Cramp [phone]), effective July 15, 2018, all controlled substances must be electronically prescribed. Calling prescriptions to the pharmacy will cease to exist.  Prescription refills: Only during scheduled appointments. Applies to all prescriptions.  NOTE: The following applies primarily to controlled substances (Opioid* Pain Medications).   Type of encounter (visit): For patients receiving controlled substances, face-to-face visits are required. (Not an option or up to the patient.)  Patient's responsibilities: 1. Pain Pills: Bring all pain pills to every appointment (except for procedure appointments). 2. Pill Bottles: Bring pills in original pharmacy bottle. Always bring the newest bottle. Bring bottle, even if empty. 3. Medication refills: You are responsible for knowing and keeping track of what medications you take and those you need refilled. The day before your appointment: write a list of all prescriptions that need to be refilled. The day of the appointment: give the list to the admitting nurse. Prescriptions will be written only during appointments. No prescriptions will be written on procedure days. If you forget a medication: it will not be "Called in", "Faxed", or "electronically sent".  You will need to get another appointment to get these prescribed. No early refills. Do not call asking to have your prescription filled early. 4. Prescription Accuracy: You are responsible for carefully inspecting your prescriptions before leaving our office. Have the discharge nurse carefully go over each prescription with you, before taking them home. Make sure that your name is accurately spelled, that your address is correct. Check the name and dose of your medication to make sure it is accurate. Check the number of pills, and the written instructions to make sure they are clear and accurate. Make sure that you are given enough medication to last until your next medication refill appointment. 5. Taking Medication: Take medication as prescribed. When it comes to controlled substances, taking less pills or less frequently than prescribed is permitted and encouraged. Never take more pills than instructed. Never take medication more frequently than prescribed.  6. Inform other Doctors: Always inform, all of your healthcare providers, of all the medications you take. 7. Pain Medication from other Providers: You are not allowed to accept any additional pain medication from any other Doctor or Healthcare provider. There are two exceptions to this rule. (see below) In the event that you require additional pain medication, you are responsible for notifying us, as stated below. 8. Medication Agreement: You are responsible for carefully reading and following our Medication Agreement. This must be signed before receiving any prescriptions from our practice. Safely store a copy of your signed Agreement. Violations to the Agreement will result in no further prescriptions. (Additional copies of our Medication Agreement are available upon request.) 9. Laws, Rules, & Regulations: All patients are expected to follow all Federal and [employer], Rules, [employer]. Ignorance of the Laws does not constitute a  valid excuse.  10. Illegal drugs and Controlled Substances: The use of illegal substances (including, but not limited to marijuana and its  derivatives) and/or the illegal use of any controlled substances is strictly prohibited. Violation of this rule may result in the immediate and permanent discontinuation of any and all prescriptions being written by our practice. The use of any illegal substances is prohibited. 11. Adopted CDC guidelines & recommendations: Target dosing levels will be at or below 60 MME/day. Use of benzodiazepines** is not recommended.  Exceptions: There are only two exceptions to the rule of not receiving pain medications from other Healthcare Providers. 1. Exception #1 (Emergencies): In the event of an emergency (i.e.: accident requiring emergency care), you are allowed to receive additional pain medication. However, you are responsible for: As soon as you are able, call our office [phone], at any time of the day or night, and leave a message stating your name, the date and nature of the emergency, and the name and dose of the medication prescribed. In the event that your call is answered by a member of our staff, make sure to document and save the date, time, and the name of the person that took your information.  2. Exception #2 (Planned Surgery): In the event that you are scheduled by another doctor or dentist to have any type of surgery or procedure, you are allowed (for a period no longer than 30 days), to receive additional pain medication, for the acute post-op pain. However, in this case, you are responsible for picking up a copy of our "Post-op Pain Management for Surgeons" handout, and giving it to your surgeon or dentist. This document is available at our office, and does not require an appointment to obtain it. Simply go to our office during business hours (Monday-Thursday from 8:00 AM to 4:00 PM) (Friday 8:00 AM to 12:00 Noon) or if you have a scheduled appointment  with [place], prior to your surgery, and ask for it by name. In addition, you are responsible for: calling our office [phone], at any time of the day or night, and leaving a message stating your name, name of your surgeon, type of surgery, and date of procedure or surgery. Failure to comply with your responsibilities may result in termination of therapy involving the controlled substances.  *Opioid medications include: morphine, codeine, oxycodone, oxymorphone, hydrocodone, hydromorphone, meperidine, tramadol, tapentadol, buprenorphine, fentanyl, methadone. **Benzodiazepine medications include: diazepam (Valium), alprazolam (Xanax), clonazepam (Klonopine), lorazepam (Ativan), clorazepate (Tranxene), chlordiazepoxide (Librium), estazolam (Prosom), oxazepam (Serax), temazepam (Restoril), triazolam (Halcion) (Last updated: 03/21/2020) ____________________________________________________________________________________________   ____________________________________________________________________________________________  Medication Recommendations and Reminders  Applies to: All patients receiving prescriptions (written and/or electronic).  Medication Rules & Regulations: These rules and regulations exist for your safety and that of others. They are not flexible and neither are we. Dismissing or ignoring them will be considered "non-compliance" with medication therapy, resulting in complete and irreversible termination of such therapy. (See document titled "Medication Rules" for more details.) In all conscience, because of safety reasons, we cannot continue providing a therapy where the patient does not follow instructions.  Pharmacy of record:   Definition: This is the pharmacy where your electronic prescriptions will be sent.   We do not endorse any particular pharmacy, however, we have experienced problems with [facility] not securing enough medication supply for the community.  We do not  restrict you in your choice of pharmacy. However, once we write for your prescriptions, we will NOT be re-sending more prescriptions to fix restricted supply problems created by your pharmacy, or your insurance.   The pharmacy listed in the electronic medical record should be the  one where you want electronic prescriptions to be sent.  If you choose to change pharmacy, simply notify our nursing staff.  Recommendations:  Keep all of your pain medications in a safe place, under lock and key, even if you live alone. We will NOT replace lost, stolen, or damaged medication.  After you fill your prescription, take 1 week's worth of pills and put them away in a safe place. You should keep a separate, properly labeled bottle for this purpose. The remainder should be kept in the original bottle. Use this as your primary supply, until it runs out. Once it's gone, then you know that you have 1 week's worth of medicine, and it is time to come in for a prescription refill. If you do this correctly, it is unlikely that you will ever run out of medicine.  To make sure that the above recommendation works, it is very important that you make sure your medication refill appointments are scheduled at least 1 week before you run out of medicine. To do this in an effective manner, make sure that you do not leave the office without scheduling your next medication management appointment. Always ask the nursing staff to show you in your prescription , when your medication will be running out. Then arrange for the receptionist to get you a return appointment, at least 7 days before you run out of medicine. Do not wait until you have 1 or 2 pills left, to come in. This is very poor planning and does not take into consideration that we may need to cancel appointments due to bad weather, sickness, or emergencies affecting our staff.  DO NOT ACCEPT A "Partial Fill": If for any reason your pharmacy does not have enough pills/tablets  to completely fill or refill your prescription, do not allow for a "partial fill". The law allows the pharmacy to complete that prescription within 72 hours, without requiring a new prescription. If they do not fill the rest of your prescription within those 72 hours, you will need a separate prescription to fill the remaining amount, which we will NOT provide. If the reason for the partial fill is your insurance, you will need to talk to the pharmacist about payment alternatives for the remaining tablets, but again, DO NOT ACCEPT A PARTIAL FILL, unless you can trust your pharmacist to obtain the remainder of the pills within 72 hours.  Prescription refills and/or changes in medication(s):   Prescription refills, and/or changes in dose or medication, will be conducted only during scheduled medication management appointments. (Applies to both, written and electronic prescriptions.)  No refills on procedure days. No medication will be changed or started on procedure days. No changes, adjustments, and/or refills will be conducted on a procedure day. Doing so will interfere with the diagnostic portion of the procedure.  No phone refills. No medications will be "called into the pharmacy".  No Fax refills.  No weekend refills.  No Holliday refills.  No after hours refills.  Remember:  Business hours are:  Monday to Thursday 8:00 AM to 4:00 PM Provider's Schedule: Milinda Pointer, MD - Appointments are:  Medication management: Monday and Wednesday 8:00 AM to 4:00 PM Procedure day: Tuesday and Thursday 7:30 AM to 4:00 PM Gillis Santa, MD - Appointments are:  Medication management: Tuesday and Thursday 8:00 AM to 4:00 PM Procedure day: Monday and Wednesday 7:30 AM to 4:00 PM (Last update: 02/02/2020) ____________________________________________________________________________________________

## 2020-05-09 LAB — TOXASSURE SELECT 13 (MW), URINE

## 2020-05-19 ENCOUNTER — Ambulatory Visit: Payer: Medicare Other | Admitting: Internal Medicine

## 2020-06-30 ENCOUNTER — Other Ambulatory Visit: Payer: Self-pay | Admitting: Internal Medicine

## 2020-06-30 DIAGNOSIS — I10 Essential (primary) hypertension: Secondary | ICD-10-CM

## 2020-07-11 ENCOUNTER — Telehealth: Payer: Self-pay | Admitting: Internal Medicine

## 2020-07-11 NOTE — Telephone Encounter (Signed)
Left message for patient to call back and schedule Medicare Annual Wellness Visit (AWV) either virtually or in office. Whichever the patients preference is.  Last AWV 09/09/18; please schedule at anytime with [facility] Advisor.  This should be a 40 minute visit.

## 2020-07-12 ENCOUNTER — Other Ambulatory Visit: Payer: Self-pay

## 2020-07-12 ENCOUNTER — Emergency Department: Payer: Medicare Other

## 2020-07-12 ENCOUNTER — Inpatient Hospital Stay
Admission: EM | Admit: 2020-07-12 | Discharge: 2020-07-13 | DRG: 180 | Disposition: A | Discharge status: Hospice | Payer: Medicare Other | Attending: Internal Medicine | Admitting: Internal Medicine

## 2020-07-12 DIAGNOSIS — I69318 Other symptoms and signs involving cognitive functions following cerebral infarction: Secondary | ICD-10-CM | POA: Diagnosis not present

## 2020-07-12 DIAGNOSIS — I110 Hypertensive heart disease with heart failure: Secondary | ICD-10-CM | POA: Diagnosis not present

## 2020-07-12 DIAGNOSIS — M5116 Intervertebral disc disorders with radiculopathy, lumbar region: Secondary | ICD-10-CM | POA: Diagnosis not present

## 2020-07-12 DIAGNOSIS — I743 Embolism and thrombosis of arteries of the lower extremities: Secondary | ICD-10-CM | POA: Diagnosis not present

## 2020-07-12 DIAGNOSIS — M5136 Other intervertebral disc degeneration, lumbar region: Secondary | ICD-10-CM | POA: Diagnosis present

## 2020-07-12 DIAGNOSIS — F17210 Nicotine dependence, cigarettes, uncomplicated: Secondary | ICD-10-CM | POA: Diagnosis present

## 2020-07-12 DIAGNOSIS — Z8711 Personal history of peptic ulcer disease: Secondary | ICD-10-CM

## 2020-07-12 DIAGNOSIS — I10 Essential (primary) hypertension: Secondary | ICD-10-CM

## 2020-07-12 DIAGNOSIS — D72829 Elevated white blood cell count, unspecified: Secondary | ICD-10-CM | POA: Diagnosis present

## 2020-07-12 DIAGNOSIS — R58 Hemorrhage, not elsewhere classified: Secondary | ICD-10-CM | POA: Diagnosis not present

## 2020-07-12 DIAGNOSIS — R634 Abnormal weight loss: Secondary | ICD-10-CM | POA: Diagnosis not present

## 2020-07-12 DIAGNOSIS — Z800 Family history of malignant neoplasm of digestive organs: Secondary | ICD-10-CM

## 2020-07-12 DIAGNOSIS — Z841 Family history of disorders of kidney and ureter: Secondary | ICD-10-CM

## 2020-07-12 DIAGNOSIS — I723 Aneurysm of iliac artery: Secondary | ICD-10-CM | POA: Diagnosis not present

## 2020-07-12 DIAGNOSIS — F418 Other specified anxiety disorders: Secondary | ICD-10-CM | POA: Diagnosis not present

## 2020-07-12 DIAGNOSIS — M4856XD Collapsed vertebra, not elsewhere classified, lumbar region, subsequent encounter for fracture with routine healing: Secondary | ICD-10-CM | POA: Diagnosis not present

## 2020-07-12 DIAGNOSIS — Z86718 Personal history of other venous thrombosis and embolism: Secondary | ICD-10-CM | POA: Diagnosis not present

## 2020-07-12 DIAGNOSIS — J9611 Chronic respiratory failure with hypoxia: Secondary | ICD-10-CM | POA: Diagnosis present

## 2020-07-12 DIAGNOSIS — I714 Abdominal aortic aneurysm, without rupture, unspecified: Secondary | ICD-10-CM | POA: Diagnosis present

## 2020-07-12 DIAGNOSIS — C801 Malignant (primary) neoplasm, unspecified: Secondary | ICD-10-CM | POA: Diagnosis not present

## 2020-07-12 DIAGNOSIS — Z7902 Long term (current) use of antithrombotics/antiplatelets: Secondary | ICD-10-CM

## 2020-07-12 DIAGNOSIS — R5381 Other malaise: Secondary | ICD-10-CM | POA: Diagnosis not present

## 2020-07-12 DIAGNOSIS — D62 Acute posthemorrhagic anemia: Secondary | ICD-10-CM | POA: Diagnosis present

## 2020-07-12 DIAGNOSIS — E43 Unspecified severe protein-calorie malnutrition: Secondary | ICD-10-CM | POA: Diagnosis present

## 2020-07-12 DIAGNOSIS — Z66 Do not resuscitate: Secondary | ICD-10-CM | POA: Diagnosis present

## 2020-07-12 DIAGNOSIS — E7800 Pure hypercholesterolemia, unspecified: Secondary | ICD-10-CM | POA: Diagnosis present

## 2020-07-12 DIAGNOSIS — G4733 Obstructive sleep apnea (adult) (pediatric): Secondary | ICD-10-CM | POA: Diagnosis not present

## 2020-07-12 DIAGNOSIS — K921 Melena: Secondary | ICD-10-CM | POA: Diagnosis present

## 2020-07-12 DIAGNOSIS — Z8679 Personal history of other diseases of the circulatory system: Secondary | ICD-10-CM | POA: Diagnosis not present

## 2020-07-12 DIAGNOSIS — R7989 Other specified abnormal findings of blood chemistry: Secondary | ICD-10-CM | POA: Diagnosis present

## 2020-07-12 DIAGNOSIS — T1490XA Injury, unspecified, initial encounter: Secondary | ICD-10-CM

## 2020-07-12 DIAGNOSIS — M810 Age-related osteoporosis without current pathological fracture: Secondary | ICD-10-CM | POA: Diagnosis present

## 2020-07-12 DIAGNOSIS — G894 Chronic pain syndrome: Secondary | ICD-10-CM | POA: Diagnosis present

## 2020-07-12 DIAGNOSIS — J90 Pleural effusion, not elsewhere classified: Secondary | ICD-10-CM | POA: Diagnosis not present

## 2020-07-12 DIAGNOSIS — C761 Malignant neoplasm of thorax: Secondary | ICD-10-CM | POA: Diagnosis not present

## 2020-07-12 DIAGNOSIS — R64 Cachexia: Secondary | ICD-10-CM | POA: Diagnosis present

## 2020-07-12 DIAGNOSIS — F17200 Nicotine dependence, unspecified, uncomplicated: Secondary | ICD-10-CM | POA: Diagnosis not present

## 2020-07-12 DIAGNOSIS — Z681 Body mass index (BMI) 19 or less, adult: Secondary | ICD-10-CM | POA: Diagnosis not present

## 2020-07-12 DIAGNOSIS — J449 Chronic obstructive pulmonary disease, unspecified: Secondary | ICD-10-CM | POA: Diagnosis present

## 2020-07-12 DIAGNOSIS — C781 Secondary malignant neoplasm of mediastinum: Secondary | ICD-10-CM | POA: Diagnosis present

## 2020-07-12 DIAGNOSIS — E876 Hypokalemia: Secondary | ICD-10-CM | POA: Diagnosis not present

## 2020-07-12 DIAGNOSIS — C7800 Secondary malignant neoplasm of unspecified lung: Secondary | ICD-10-CM | POA: Diagnosis not present

## 2020-07-12 DIAGNOSIS — I491 Atrial premature depolarization: Secondary | ICD-10-CM | POA: Diagnosis not present

## 2020-07-12 DIAGNOSIS — K5790 Diverticulosis of intestine, part unspecified, without perforation or abscess without bleeding: Secondary | ICD-10-CM | POA: Diagnosis not present

## 2020-07-12 DIAGNOSIS — I5032 Chronic diastolic (congestive) heart failure: Secondary | ICD-10-CM | POA: Diagnosis not present

## 2020-07-12 DIAGNOSIS — F0390 Unspecified dementia without behavioral disturbance: Secondary | ICD-10-CM | POA: Diagnosis present

## 2020-07-12 DIAGNOSIS — R0602 Shortness of breath: Secondary | ICD-10-CM | POA: Diagnosis not present

## 2020-07-12 DIAGNOSIS — C7951 Secondary malignant neoplasm of bone: Secondary | ICD-10-CM | POA: Diagnosis not present

## 2020-07-12 DIAGNOSIS — Z20822 Contact with and (suspected) exposure to covid-19: Secondary | ICD-10-CM | POA: Diagnosis present

## 2020-07-12 DIAGNOSIS — Z7189 Other specified counseling: Secondary | ICD-10-CM | POA: Diagnosis not present

## 2020-07-12 DIAGNOSIS — Z808 Family history of malignant neoplasm of other organs or systems: Secondary | ICD-10-CM

## 2020-07-12 DIAGNOSIS — K8020 Calculus of gallbladder without cholecystitis without obstruction: Secondary | ICD-10-CM | POA: Diagnosis not present

## 2020-07-12 DIAGNOSIS — M549 Dorsalgia, unspecified: Secondary | ICD-10-CM | POA: Diagnosis not present

## 2020-07-12 DIAGNOSIS — M6281 Muscle weakness (generalized): Secondary | ICD-10-CM | POA: Diagnosis not present

## 2020-07-12 DIAGNOSIS — C3412 Malignant neoplasm of upper lobe, left bronchus or lung: Secondary | ICD-10-CM | POA: Diagnosis not present

## 2020-07-12 DIAGNOSIS — R109 Unspecified abdominal pain: Secondary | ICD-10-CM | POA: Diagnosis not present

## 2020-07-12 DIAGNOSIS — Z8673 Personal history of transient ischemic attack (TIA), and cerebral infarction without residual deficits: Secondary | ICD-10-CM | POA: Diagnosis not present

## 2020-07-12 DIAGNOSIS — Z96641 Presence of right artificial hip joint: Secondary | ICD-10-CM | POA: Diagnosis present

## 2020-07-12 DIAGNOSIS — C779 Secondary and unspecified malignant neoplasm of lymph node, unspecified: Secondary | ICD-10-CM | POA: Diagnosis present

## 2020-07-12 DIAGNOSIS — R945 Abnormal results of liver function studies: Secondary | ICD-10-CM | POA: Diagnosis not present

## 2020-07-12 DIAGNOSIS — Z9981 Dependence on supplemental oxygen: Secondary | ICD-10-CM | POA: Diagnosis not present

## 2020-07-12 DIAGNOSIS — C3490 Malignant neoplasm of unspecified part of unspecified bronchus or lung: Secondary | ICD-10-CM | POA: Diagnosis not present

## 2020-07-12 DIAGNOSIS — I739 Peripheral vascular disease, unspecified: Secondary | ICD-10-CM | POA: Diagnosis present

## 2020-07-12 DIAGNOSIS — R29810 Facial weakness: Secondary | ICD-10-CM | POA: Diagnosis not present

## 2020-07-12 DIAGNOSIS — R404 Transient alteration of awareness: Secondary | ICD-10-CM | POA: Diagnosis not present

## 2020-07-12 DIAGNOSIS — J80 Acute respiratory distress syndrome: Secondary | ICD-10-CM | POA: Diagnosis not present

## 2020-07-12 DIAGNOSIS — J439 Emphysema, unspecified: Secondary | ICD-10-CM | POA: Diagnosis not present

## 2020-07-12 DIAGNOSIS — Z87448 Personal history of other diseases of urinary system: Secondary | ICD-10-CM | POA: Diagnosis not present

## 2020-07-12 DIAGNOSIS — R279 Unspecified lack of coordination: Secondary | ICD-10-CM | POA: Diagnosis not present

## 2020-07-12 DIAGNOSIS — C7989 Secondary malignant neoplasm of other specified sites: Secondary | ICD-10-CM | POA: Diagnosis not present

## 2020-07-12 DIAGNOSIS — E785 Hyperlipidemia, unspecified: Secondary | ICD-10-CM | POA: Diagnosis not present

## 2020-07-12 DIAGNOSIS — Z515 Encounter for palliative care: Secondary | ICD-10-CM | POA: Diagnosis not present

## 2020-07-12 DIAGNOSIS — C20 Malignant neoplasm of rectum: Secondary | ICD-10-CM | POA: Diagnosis present

## 2020-07-12 DIAGNOSIS — R000 Tachycardia, unspecified: Secondary | ICD-10-CM | POA: Diagnosis not present

## 2020-07-12 DIAGNOSIS — I700 Atherosclerosis of aorta: Secondary | ICD-10-CM | POA: Diagnosis not present

## 2020-07-12 DIAGNOSIS — Z79899 Other long term (current) drug therapy: Secondary | ICD-10-CM

## 2020-07-12 DIAGNOSIS — K625 Hemorrhage of anus and rectum: Secondary | ICD-10-CM | POA: Diagnosis present

## 2020-07-12 DIAGNOSIS — L89151 Pressure ulcer of sacral region, stage 1: Secondary | ICD-10-CM | POA: Diagnosis present

## 2020-07-12 DIAGNOSIS — I639 Cerebral infarction, unspecified: Secondary | ICD-10-CM | POA: Diagnosis present

## 2020-07-12 LAB — CBC
HCT: 36.1 % (ref 36.0–46.0)
HCT: 34.9 % — ABNORMAL LOW (ref 36.0–46.0)
Hemoglobin: 11.2 g/dL — ABNORMAL LOW (ref 12.0–15.0)
Hemoglobin: 10.8 g/dL — ABNORMAL LOW (ref 12.0–15.0)
MCH: 29.5 pg (ref 26.0–34.0)
MCH: 29.5 pg (ref 26.0–34.0)
MCHC: 31 g/dL (ref 30.0–36.0)
MCHC: 30.9 g/dL (ref 30.0–36.0)
MCV: 95 fL (ref 80.0–100.0)
MCV: 95.4 fL (ref 80.0–100.0)
Platelets: 485 10*3/uL — ABNORMAL HIGH (ref 150–400)
Platelets: 518 10*3/uL — ABNORMAL HIGH (ref 150–400)
RBC: 3.8 MIL/uL — ABNORMAL LOW (ref 3.87–5.11)
RBC: 3.66 MIL/uL — ABNORMAL LOW (ref 3.87–5.11)
RDW: 18.1 % — ABNORMAL HIGH (ref 11.5–15.5)
RDW: 18.1 % — ABNORMAL HIGH (ref 11.5–15.5)
WBC: 9.9 10*3/uL (ref 4.0–10.5)
WBC: 10.7 10*3/uL — ABNORMAL HIGH (ref 4.0–10.5)
nRBC: 0 % (ref 0.0–0.2)
nRBC: 0 % (ref 0.0–0.2)

## 2020-07-12 LAB — COMPREHENSIVE METABOLIC PANEL
ALT: 35 U/L (ref 0–44)
AST: 103 U/L — ABNORMAL HIGH (ref 15–41)
Albumin: 2.5 g/dL — ABNORMAL LOW (ref 3.5–5.0)
Alkaline Phosphatase: 114 U/L (ref 38–126)
Anion gap: 9 (ref 5–15)
BUN: 18 mg/dL (ref 8–23)
CO2: 27 mmol/L (ref 22–32)
Calcium: 7.8 mg/dL — ABNORMAL LOW (ref 8.9–10.3)
Chloride: 105 mmol/L (ref 98–111)
Creatinine, Ser: 0.73 mg/dL (ref 0.44–1.00)
GFR, Estimated: >60 mL/min (ref >60)
Glucose, Bld: 106 mg/dL — ABNORMAL HIGH (ref 70–99)
Potassium: 3.4 mmol/L — ABNORMAL LOW (ref 3.5–5.1)
Sodium: 141 mmol/L (ref 135–145)
Total Bilirubin: 0.8 mg/dL (ref 0.3–1.2)
Total Protein: 6.5 g/dL (ref 6.5–8.1)

## 2020-07-12 LAB — CBC WITH DIFFERENTIAL/PLATELET
Abs Immature Granulocytes: 0.08 10*3/uL — ABNORMAL HIGH (ref 0.00–0.07)
Basophils Absolute: 0.1 10*3/uL (ref 0.0–0.1)
Basophils Relative: 0 %
Eosinophils Absolute: 0.1 10*3/uL (ref 0.0–0.5)
Eosinophils Relative: 0 %
HCT: 37.7 % (ref 36.0–46.0)
Hemoglobin: 11.8 g/dL — ABNORMAL LOW (ref 12.0–15.0)
Immature Granulocytes: 1 %
Lymphocytes Relative: 9 %
Lymphs Abs: 1.4 10*3/uL (ref 0.7–4.0)
MCH: 29.4 pg (ref 26.0–34.0)
MCHC: 31.3 g/dL (ref 30.0–36.0)
MCV: 94 fL (ref 80.0–100.0)
Monocytes Absolute: 0.6 10*3/uL (ref 0.1–1.0)
Monocytes Relative: 4 %
Neutro Abs: 13.2 10*3/uL — ABNORMAL HIGH (ref 1.7–7.7)
Neutrophils Relative %: 86 %
Platelets: 575 10*3/uL — ABNORMAL HIGH (ref 150–400)
RBC: 4.01 MIL/uL (ref 3.87–5.11)
RDW: 17.7 % — ABNORMAL HIGH (ref 11.5–15.5)
WBC: 15.4 10*3/uL — ABNORMAL HIGH (ref 4.0–10.5)
nRBC: 0 % (ref 0.0–0.2)

## 2020-07-12 LAB — URINALYSIS, COMPLETE (UACMP) WITH MICROSCOPIC
Bacteria, UA: NONE SEEN
Bilirubin Urine: NEGATIVE
Glucose, UA: NEGATIVE mg/dL
Ketones, ur: NEGATIVE mg/dL
Leukocytes,Ua: NEGATIVE
Nitrite: NEGATIVE
Protein, ur: 100 mg/dL — AB
Specific Gravity, Urine: >1.046 — ABNORMAL HIGH (ref 1.005–1.030)
Squamous Epithelial / HPF: NONE SEEN (ref 0–5)
pH: 5 (ref 5.0–8.0)

## 2020-07-12 LAB — SARS CORONAVIRUS 2 (TAT 6-24 HRS): SARS Coronavirus 2: NEGATIVE

## 2020-07-12 LAB — LIPASE, BLOOD: Lipase: 60 U/L — ABNORMAL HIGH (ref 11–51)

## 2020-07-12 LAB — BRAIN NATRIURETIC PEPTIDE: B Natriuretic Peptide: 554.9 pg/mL — ABNORMAL HIGH (ref 0.0–100.0)

## 2020-07-12 MED ORDER — IOHEXOL 300 MG/ML  SOLN
75.0000 mL | Freq: Once | INTRAMUSCULAR | Status: AC | PRN
  Administered 2020-07-12: 75 mL via INTRAVENOUS

## 2020-07-12 MED ORDER — MORPHINE SULFATE (PF) 2 MG/ML IV SOLN
1.0000 mg | INTRAVENOUS | Status: DC | PRN
  Administered 2020-07-12 – 2020-07-13 (×2): 1 mg via INTRAVENOUS
  Filled 2020-07-12 (×2): qty 1

## 2020-07-12 MED ORDER — HALOPERIDOL LACTATE 5 MG/ML IJ SOLN: 0.5000 mg | INTRAMUSCULAR | Status: DC | PRN

## 2020-07-12 MED ORDER — LORAZEPAM 2 MG/ML PO CONC: 1.0000 mg | ORAL | Status: DC | PRN

## 2020-07-12 MED ORDER — FUROSEMIDE 20 MG PO TABS
20.0000 mg | ORAL_TABLET | Freq: Every day | ORAL | Status: DC
  Administered 2020-07-12: 18:00:00 20 mg via ORAL
  Filled 2020-07-12 (×2): qty 1

## 2020-07-12 MED ORDER — NICOTINE 21 MG/24HR TD PT24
21.0000 mg | MEDICATED_PATCH | Freq: Every day | TRANSDERMAL | Status: DC
  Filled 2020-07-12 (×2): qty 1

## 2020-07-12 MED ORDER — IPRATROPIUM-ALBUTEROL 0.5-2.5 (3) MG/3ML IN SOLN
3.0000 mL | Freq: Once | RESPIRATORY_TRACT | Status: AC
  Administered 2020-07-12: 3 mL via RESPIRATORY_TRACT
  Filled 2020-07-12: qty 3

## 2020-07-12 MED ORDER — FENTANYL CITRATE (PF) 100 MCG/2ML IJ SOLN
50.0000 ug | Freq: Once | INTRAMUSCULAR | Status: AC
  Administered 2020-07-12: 05:00:00 50 ug via INTRAVENOUS
  Filled 2020-07-12: qty 2

## 2020-07-12 MED ORDER — HALOPERIDOL 0.5 MG PO TABS
0.5000 mg | ORAL_TABLET | ORAL | Status: DC | PRN
  Filled 2020-07-12: qty 1

## 2020-07-12 MED ORDER — POLYVINYL ALCOHOL 1.4 % OP SOLN
1.0000 [drp] | Freq: Four times a day (QID) | OPHTHALMIC | Status: DC | PRN
  Filled 2020-07-12: qty 15

## 2020-07-12 MED ORDER — ACETAMINOPHEN 650 MG RE SUPP: 650.0000 mg | Freq: Four times a day (QID) | RECTAL | Status: DC | PRN

## 2020-07-12 MED ORDER — GLYCOPYRROLATE 1 MG PO TABS
1.0000 mg | ORAL_TABLET | ORAL | Status: DC | PRN
  Filled 2020-07-12: qty 1

## 2020-07-12 MED ORDER — HYDROXYZINE HCL 10 MG PO TABS
10.0000 mg | ORAL_TABLET | Freq: Three times a day (TID) | ORAL | Status: DC | PRN
  Administered 2020-07-12: 21:00:00 10 mg via ORAL
  Filled 2020-07-12 (×2): qty 1

## 2020-07-12 MED ORDER — POTASSIUM CHLORIDE CRYS ER 20 MEQ PO TBCR
20.0000 meq | EXTENDED_RELEASE_TABLET | Freq: Once | ORAL | Status: AC
  Administered 2020-07-12: 18:00:00 20 meq via ORAL
  Filled 2020-07-12: qty 1

## 2020-07-12 MED ORDER — HYDRALAZINE HCL 20 MG/ML IJ SOLN: 5.0000 mg | INTRAMUSCULAR | Status: DC | PRN

## 2020-07-12 MED ORDER — GLYCOPYRROLATE 0.2 MG/ML IJ SOLN
0.2000 mg | INTRAMUSCULAR | Status: DC | PRN
  Filled 2020-07-12: qty 1

## 2020-07-12 MED ORDER — PANTOPRAZOLE SODIUM 40 MG IV SOLR
40.0000 mg | Freq: Two times a day (BID) | INTRAVENOUS | Status: DC
  Administered 2020-07-12 – 2020-07-13 (×3): 40 mg via INTRAVENOUS
  Filled 2020-07-12 (×3): qty 40

## 2020-07-12 MED ORDER — ONDANSETRON HCL 4 MG/2ML IJ SOLN
4.0000 mg | Freq: Once | INTRAMUSCULAR | Status: AC
  Administered 2020-07-12: 05:00:00 4 mg via INTRAVENOUS
  Filled 2020-07-12: qty 2

## 2020-07-12 MED ORDER — HALOPERIDOL LACTATE 2 MG/ML PO CONC
0.5000 mg | ORAL | Status: DC | PRN
Start: 2020-07-12 — End: 2020-07-13
  Filled 2020-07-12: qty 0.3

## 2020-07-12 MED ORDER — ONDANSETRON 4 MG PO TBDP
4.0000 mg | ORAL_TABLET | Freq: Four times a day (QID) | ORAL | Status: DC | PRN
  Filled 2020-07-12: qty 1

## 2020-07-12 MED ORDER — ONDANSETRON HCL 4 MG/2ML IJ SOLN: 4.0000 mg | Freq: Four times a day (QID) | INTRAMUSCULAR | Status: DC | PRN

## 2020-07-12 MED ORDER — ATORVASTATIN CALCIUM 20 MG PO TABS
10.0000 mg | ORAL_TABLET | Freq: Every day | ORAL | Status: DC
  Administered 2020-07-12: 18:00:00 10 mg via ORAL
  Filled 2020-07-12 (×2): qty 1

## 2020-07-12 MED ORDER — BIOTENE DRY MOUTH MT LIQD
15.0000 mL | OROMUCOSAL | Status: DC | PRN
  Filled 2020-07-12: qty 15

## 2020-07-12 MED ORDER — ATENOLOL 25 MG PO TABS
25.0000 mg | ORAL_TABLET | Freq: Two times a day (BID) | ORAL | Status: DC
  Administered 2020-07-12 (×2): 25 mg via ORAL
  Filled 2020-07-12 (×4): qty 1

## 2020-07-12 MED ORDER — ALBUTEROL SULFATE (2.5 MG/3ML) 0.083% IN NEBU: 2.5000 mg | INHALATION_SOLUTION | RESPIRATORY_TRACT | Status: DC | PRN

## 2020-07-12 MED ORDER — ENSURE ENLIVE PO LIQD
237.0000 mL | Freq: Two times a day (BID) | ORAL | Status: DC
  Administered 2020-07-13: 10:00:00 237 mL via ORAL

## 2020-07-12 MED ORDER — LORAZEPAM 2 MG/ML IJ SOLN
0.5000 mg | INTRAMUSCULAR | Status: DC | PRN
Start: 2020-07-12 — End: 2020-07-13

## 2020-07-12 MED ORDER — ACETAMINOPHEN 325 MG PO TABS: 650.0000 mg | ORAL_TABLET | Freq: Four times a day (QID) | ORAL | Status: DC | PRN

## 2020-07-12 MED ORDER — LORAZEPAM 1 MG PO TABS
1.0000 mg | ORAL_TABLET | ORAL | Status: DC | PRN
  Administered 2020-07-12: 21:00:00 1 mg via ORAL
  Filled 2020-07-12: qty 1

## 2020-07-12 NOTE — ED Provider Notes (Signed)
[facility] Emergency Department Provider Note  ____________________________________________  Time seen: Approximately 5:47 AM  I have reviewed the triage vital signs and the nursing notes.   HISTORY  Chief Complaint Abdominal Pain  Level 5 caveat:  Portions of the history and physical were unable to be obtained due to dementia   HPI Tasha Reyes is a 86 y.o. female with a history of AAA status post repair, COPD on 2 L nasal cannula, DVT, malnutrition, CHF who presents from home for abdominal pain.  According to EMS, patient's son called 911 after she was complaining of abdominal pain and had a  large bloody bowel movement.  No prior history of GI bleeds.  Patient is not on any blood thinners.  Patient has some dried emesis on her clothing.  She is complaining of diffuse abdominal pain.  She is also complaining of worsening shortness of breath from her baseline.  No fever or cough.  Past Medical History:  Diagnosis Date  . Acute on chronic respiratory failure ([place]) 05/20/2018  . Aneurysm of iliac artery (HCC) 05/11/2015  . CHF (congestive heart failure) ([place])   . COPD (chronic obstructive pulmonary disease) ([place])   . Cystitis   . History of DVT (deep vein thrombosis) 05/11/2015  . History of peptic ulcer disease 05/11/2015  . [name]   . Hypertension   . Insomnia   . Losing weight   . Migraines   . [name]   . Rectal bleeding   . Stroke ([place])   . Vascular disease     Patient Active Problem List   Diagnosis Date Noted  . Unspecified inflammatory spondylopathy, lumbar region ([place]) 11/17/2019  . Chronic musculoskeletal pain 01/11/2019  . Pharmacologic therapy 01/11/2019  . Problems influencing health status 01/11/2019  . Chronic heart failure with preserved ejection fraction (HFpEF) ([place]) 11/11/2018  . Chronic diastolic heart failure ([place]) 05/29/2018  . Malnutrition of moderate degree 05/22/2018  . Chronic pain  syndrome 03/02/2018  . Chronic low back pain (Secondary area of Pain) (Bilateral) w/ sciatica (Bilateral) (R>L) 02/24/2018  . Bilateral leg numbness 02/24/2018  . Accidental medication overdose 02/22/2018  . DDD (degenerative disc disease), lumbar 02/03/2018  . Chronic anticoagulation (Plavix) 02/03/2018  . Chronic lower extremity pain (Bilateral) (R>L) 01/20/2018  . Disorder of skeletal system 01/20/2018  . Chronic hip pain (Right) 10/21/2017  . Rectal bleeding 09/06/2017  . History of repair of hip fracture 06/16/2017  . AAA (abdominal aortic aneurysm) without rupture ([place]) 03/18/2017  . Edema due to malnutrition ([place]) 10/28/2016  . Dependence on supplemental oxygen 09/04/2016  . Protein-calorie malnutrition, severe 08/20/2016  . Nausea with vomiting 07/30/2016  . Vitamin D deficiency 01/10/2016  . Opioid-induced constipation ([facility]) 11/07/2015  . Osteoporosis, post-menopausal 11/07/2015  . Complaints of weakness of lower extremities (Bilateral) 11/07/2015  . Lumbar foraminal stenosis (Right) (Severe at L4-5) 06/20/2015  . Chronic lower extremity pain (Primary Area of Pain) (Right) 05/11/2015  . Lumbosacral Radiculopathy (Right) 05/11/2015  . Chronic radicular pain of lower extremity (Right) 05/11/2015  . Peripheral vascular disease ([place]) 05/11/2015  . Hx of long term use of blood thinners (Plavix) 05/11/2015  . Lower extremity pain (Right) 05/11/2015  . Neurogenic pain 05/11/2015  . History of DVT (deep vein thrombosis) 05/11/2015  . Lumbar facet syndrome (Bilateral) 05/11/2015  . Dyslipidemia 12/23/2014  . Compulsive tobacco user syndrome 12/23/2014  . History of bleeding peptic ulcer 12/23/2014  . Essential (primary) hypertension 12/23/2014  . Depression, major, single  episode, complete remission ([place]) 12/23/2014  . COPD (chronic obstructive pulmonary disease) ([place]) 12/23/2014  . Restless leg 12/23/2014  . Chronic tension-type headache, intractable 12/23/2014    Past  Surgical History:  Procedure Laterality Date  . ABDOMINAL AORTIC ANEURYSM REPAIR W/ ENDOLUMINAL GRAFT  2008  . ABDOMINAL HYSTERECTOMY    . APPENDECTOMY    . BACK SURGERY    . blood clot      removal right leg  . COLONOSCOPY  2012  . ESOPHAGOGASTRODUODENOSCOPY  04/2013   gastric ulcers  . FEMORAL-POPLITEAL BYPASS GRAFT Right 2008  . HIP ARTHROPLASTY Right 06/17/2017   Procedure: ARTHROPLASTY BIPOLAR HIP (HEMIARTHROPLASTY);  Surgeon: Earnestine Leys, MD;  Location: [facility] ORS;  Service: Orthopedics;  Laterality: Right;  . JOINT REPLACEMENT Right 06/19/2017   right hip FX.  Marland Kitchen PARTIAL HYSTERECTOMY      Prior to Admission medications   Medication Sig Start Date End Date Taking? Authorizing Provider  albuterol (PROVENTIL) (2.5 MG/3ML) 0.083% nebulizer solution USE 1 VIAL VIA NEBULIZER EVERY 8 HOURS 01/29/20   Glean Hess, MD  atenolol (TENORMIN) 25 MG tablet TAKE 1 TABLET BY MOUTH TWICE DAILY 06/30/20   Glean Hess, MD  atorvastatin (LIPITOR) 10 MG tablet TAKE 1 TABLET(10 MG) BY MOUTH AT BEDTIME 06/20/19   Glean Hess, MD  clopidogrel (PLAVIX) 75 MG tablet TAKE 1 TABLET BY MOUTH DAILY 06/21/19   Kris Hartmann, NP  furosemide (LASIX) 20 MG tablet TAKE 1 TABLET(20 MG) BY MOUTH DAILY 02/15/20   Glean Hess, MD  HYDROcodone-acetaminophen (NORCO/VICODIN) 5-325 MG tablet Take 1 tablet by mouth every 8 (eight) hours as needed for severe pain. Must last 15 days 05/06/20 06/05/20  Milinda Pointer, MD  HYDROcodone-acetaminophen (NORCO/VICODIN) 5-325 MG tablet Take 1 tablet by mouth every 8 (eight) hours as needed for severe pain. Must last 15 days 06/05/20 07/05/20  Milinda Pointer, MD  HYDROcodone-acetaminophen (NORCO/VICODIN) 5-325 MG tablet Take 1 tablet by mouth every 8 (eight) hours as needed for severe pain. Must last 15 days 07/05/20 08/04/20  Milinda Pointer, MD  hydrOXYzine (VISTARIL) 25 MG capsule Take 1 capsule (25 mg total) by mouth 3 (three) times daily as needed  for anxiety. 11/17/19   Glean Hess, MD  meclizine (ANTIVERT) 25 MG tablet Take 1 tablet (25 mg total) by mouth 3 (three) times daily as needed for dizziness or nausea. 01/20/20   Earleen Newport, MD  ondansetron (ZOFRAN-ODT) 8 MG disintegrating tablet DISSOLVE 1 TABLET(8 MG) ON THE TONGUE TWICE DAILY FOR 15 DAYS 04/03/20   Glean Hess, MD  OXYGEN Inhale 2 L/min into the lungs. Use via nasal cannula at night and during the day as needed    [provider]  pantoprazole (PROTONIX) 40 MG tablet TAKE 1 TABLET(40 MG) BY MOUTH TWICE DAILY 09/18/19   Glean Hess, MD    Allergies Other  Family History  Problem Relation Age of Onset  . Brain cancer Father   . Kidney disease Mother   . Colon cancer Brother     Social History Social History   Tobacco Use  . Smoking status: Current Every Day Smoker    Packs/day: 0.10    Years: 61.00    Pack years: 6.10    Types: Cigarettes  . Smokeless tobacco: Never Used  . Tobacco comment: 4 cigrettes a day- cut back  Vaping Use  . Vaping Use: Never used  Substance Use Topics  . Alcohol use: No    Alcohol/week: 0.0 standard  drinks  . Drug use: No    Review of Systems  Constitutional: Negative for fever. Eyes: Negative for visual changes. ENT: Negative for sore throat. Neck: No neck pain  Cardiovascular: Negative for chest pain. Respiratory: + shortness of breath. Gastrointestinal: + abdominal pain, vomiting and bloody bowel movement. Genitourinary: Negative for dysuria. Musculoskeletal: Negative for back pain. Skin: Negative for rash. Neurological: Negative for headaches, weakness or numbness. Psych: No SI or HI  ____________________________________________   PHYSICAL EXAM:  VITAL SIGNS: ED Triage Vitals  Enc Vitals Group     BP 07/12/20 0339 (!) 115/92     Pulse Rate 07/12/20 0339 (!) 104     Resp 07/12/20 0339 (!) 22     Temp 07/12/20 0339 98.1 F (36.7 C)     Temp Source 07/12/20 0339 Oral     SpO2  07/12/20 0339 98 %     Weight --      Height --      Head Circumference --      Peak Flow --      Pain Score 07/12/20 0340 8     Pain Loc --      Pain Edu? --      Excl. in [place]? --     Constitutional: Alert and oriented, cachectic, mild respiratory distress.  HEENT:      Head: Normocephalic and atraumatic.         Eyes: Conjunctivae are normal. Sclera is non-icteric.       Mouth/Throat: Mucous membranes are dry.       Neck: Supple with no signs of meningismus. Cardiovascular: Irregular rhythm with borderline tachycardic rate. Respiratory: Increased work of breathing, severely diminished air movement bilaterally, satting 98% on her baseline 2 L Gastrointestinal: Soft, diffusely tender to palpation, and non distended with positive bowel sounds. No rebound or guarding.  Rectal exam showing light brown stool guaiac negative Musculoskeletal: 2+ pitting edema bilateral lower extremity.  Right foot is warm to the touch and has some mild erythema.  Patient has several areas of necrosis on bilateral feet. Neurologic: Normal speech and language. Face is symmetric. Moving all extremities. No gross focal neurologic deficits are appreciated. Skin: Skin is warm, dry and intact. No rash noted. Psychiatric: Mood and affect are normal. Speech and behavior are normal.  ____________________________________________   LABS (all labs ordered are listed, but only abnormal results are displayed)  Labs Reviewed  CBC WITH DIFFERENTIAL/PLATELET - Abnormal; Notable for the following components:      Result Value   WBC 15.4 (*)    Hemoglobin 11.8 (*)    RDW 17.7 (*)    Platelets 575 (*)    Neutro Abs 13.2 (*)    Abs Immature Granulocytes 0.08 (*)    All other components within normal limits  COMPREHENSIVE METABOLIC PANEL - Abnormal; Notable for the following components:   Potassium 3.4 (*)    Glucose, Bld 106 (*)    Calcium 7.8 (*)    Albumin 2.5 (*)    AST 103 (*)    All other components within  normal limits  LIPASE, BLOOD - Abnormal; Notable for the following components:   Lipase 60 (*)    All other components within normal limits  URINALYSIS, COMPLETE (UACMP) WITH MICROSCOPIC   ____________________________________________  EKG  Sinus tachycardia with frequent PACs, prolonged QTC, LAFB, no ST elevations.  No significant changes when compared to prior. ____________________________________________  RADIOLOGY  I have personally reviewed the images performed during this visit and I  agree with the Radiologist's read.   Interpretation by Radiologist:  CT CHEST ABDOMEN PELVIS W CONTRAST  Result Date: 07/12/2020 CLINICAL DATA:  86 year old female with "large read bowel movement", decreased mental status, Chronic Emphysema (ICD10-J43.9) with widespread abnormal right lung opacity and right lung volume loss on chest x-ray new since 2019. EXAM: CT CHEST, ABDOMEN, AND PELVIS WITH CONTRAST TECHNIQUE: Multidetector CT imaging of the chest, abdomen and pelvis was performed following the standard protocol during bolus administration of intravenous contrast. CONTRAST:  84mL OMNIPAQUE IOHEXOL 300 MG/ML  SOLN COMPARISON:  Chest CT 11/12/2011.  CT Abdomen and Pelvis 09/06/2017. FINDINGS: CT CHEST FINDINGS Cardiovascular: Chronic Calcified aortic atherosclerosis. Mild right atrial enlargement. No pericardial effusion. Central pulmonary arteries appear to be patent. Mediastinum/Nodes: Chronic exophytic thyroid goiter at the thoracic inlet, stable since 2013. New bulky lymph node metastasis or metastatic tumor abutting the mediastinum along the left lateral margin of the ascending aorta on series 2, image 28, 22 mm. Heterogeneously enhancing lobulated tumor inseparable from the right hilum (series 2 image 31, 28). Tumor size up to 6.5 cm long axis (sagittal image 48, coronal image 60. Additional maximal 10 mm right precarinal lymph node suspicious for small nodal metastasis. Lungs/Pleura: Moderate size  layering right pleural effusion with simple fluid density superimposed on tumor extending from the right hilum to the peripheral pleura of the right mid chest (series 2, image 33). Surrounding lung consolidation. Short segment occlusion of the right upper lobe bronchus is associated on series 4, image 70. Superimposed enhancing atelectatic lung in the right middle and lower lobes. Underlying extensive centrilobular emphysema. Other major airways remain patent. New irregular and mildly spiculated opacity also in the left upper lung encompassing 10-15 mm (series 4, image 47. This is indeterminate for scarring versus synchronous primary. Musculoskeletal: Osteopenia. Right lateral ribs in proximity to the lung tumor remain intact. No acute or suspicious osseous lesion identified in the chest. CT ABDOMEN PELVIS FINDINGS Hepatobiliary: Chronic gallbladder sludge and/or stones. Patulous gallbladder today but no definite pericholecystic inflammation. Heterogeneous liver enhancement with no definite hepatic metastatic disease. Abnormal contour of the liver dome appears related to overlying pleural fluid and mild mass effect on the diaphragm, and abnormal pleural thickening and enhancement is identified on coronal image 29. Pancreas: Negative. Spleen: Negative. Adrenals/Urinary Tract: Adrenal glands remain within normal limits. Kidneys remain within normal limits. Urinary bladder mostly obscured by hip arthroplasty artifact. Stomach/Bowel: Lobular and enhancing rectal mass (series 2, image 123) new since 2019. This has indistinct margins, and is partially obscured by artifact from right hip arthroplasty, but is grossly estimated at 6.6 cm on series 2, image 120. No upstream dilated bowel. Decompressed sigmoid colon with extensive diverticulosis. Decompressed descending colon. Somewhat featureless large bowel loops elsewhere. Cecum partially located in the pelvis containing stool. Negative terminal ileum. Appendix cannot be  delineated. Mostly decompressed stomach. No free air.  No free fluid. Vascular/Lymphatic: Extensive Calcified aortic atherosclerosis. Chronic partially thrombosed distal aortic endograft is stable. The right iliac limb is chronically thrombosed. Reconstituted flow in the right iliac artery bifurcation. Furthermore, there is a chronic femoral bypass which has thrombosed since 2019 (series 2, image 99). No other acute arterial thrombosis identified in the abdomen or pelvis. Portal venous system is diminutive but patent. [id number] No lymphadenopathy identified in the abdomen. No definite lymphadenopathy in the pelvis. Reproductive: Uterus appears to be chronically absent. There are chronic bilateral ovarian cysts which have simple fluid density (series 2, image 103) and have not significantly changed since 2019 (  series 2, image 99 on the left). Other: Pelvis partially obscured by artifact from right hip arthroplasty. No definite pelvic free fluid. Musculoskeletal: Mild L2 superior endplate compression fracture is new since 2019 but has a chronic appearance. Chronic L4-L5 advanced disc and endplate degeneration. No acute or suspicious osseous lesion identified. Chronic right hip arthroplasty. IMPRESSION: 1. Advanced Malignancy in the chest. - lobulated and enhancing tumor encompassing up to 6.5 cm extending from the right hilum to the peripheral pleural surface. - abnormal enhancing pleura along the diaphragm. Associated small to moderate right pleural effusion (partially sub pulmonic). -mediastinal metastases. - a spiculated left upper lobe lung nodule indeterminate for scarring versus a synchronous primary carcinoma. - Emphysema (ICD10-J43.9). 2. Superimposed primary Rectal Carcinoma also suspected, with an indistinct thick and enhancing rectal tumor estimated at 6.6 cm. But no metastatic disease is identified in the abdomen. 3. Severe chronic vasculopathy also with thrombosis of a femoral artery bypass since 2019. Stable  chronic Aortic Endograft with thrombosed right iliac limb. Extensive Aortic Atherosclerosis (ICD10-I70.0). 4. Cholelithiasis with patulous gallbladder but no strong evidence of acute cholecystitis by CT. 5. Mild chronic appearing L2 compression fracture is new since 2019. Electronically Signed   By: Genevie Ann M.D.   On: 07/12/2020 06:58   DG Chest Portable 1 View  Result Date: 07/12/2020 CLINICAL DATA:  86 year old female with shortness of breath. EXAM: PORTABLE CHEST 1 VIEW COMPARISON:  Chest radiographs 06/30/2018 and earlier. FINDINGS: Portable AP upright view at 0345 hours. Chronic pulmonary hyperinflation with centrilobular emphysema. Moderately reduced volume in the right lung with widespread abnormal right lung opacity is new since 2019. Evidence of both airspace consolidation and right pleural effusion. Mediastinum somewhat deviated to the right. No pneumothorax. Left lung appears to remain clear. Calcified aortic atherosclerosis. Visualized tracheal air column is within normal limits. No acute osseous abnormality identified. IMPRESSION: Chronic Emphysema (ICD10-J43.9) with widespread abnormal right lung opacity and right lung volume loss, all new since 2019. Recommend Chest CT (IV contrast preferred) to further characterize. Electronically Signed   By: Genevie Ann M.D.   On: 07/12/2020 04:04     ____________________________________________   PROCEDURES  Procedure(s) performed:yes .1-3 Lead EKG Interpretation Performed by: Rudene Re, MD Authorized by: Rudene Re, MD     Interpretation: abnormal     ECG rate assessment: tachycardic     Rhythm: sinus tachycardia     Ectopy: PAC     Critical Care performed:  None ____________________________________________   INITIAL IMPRESSION / ASSESSMENT AND PLAN / ED COURSE   86 y.o. female with a history of AAA status post repair, COPD on 2 L nasal cannula, DVT, malnutrition, CHF who presents from home for abdominal pain, bloody  bowel movement, and worsening SOB.  Patient is cachectic and mild respiratory distress but satting well on her home oxygen, she has severely diminished air movement bilaterally.  Also complaining of abdominal pain.  She has mild diffuse tenderness with no localized tenderness, rebound or guarding.  Per EMS patient had a bloody bowel movement at home.  Here her rectal exam shows light brown stool guaiac negative.  Patient has a stage I decubitus ulcer in the sacral region with no signs of overlying infection.  EKG with no signs of acute ischemic changes.  Ddx    - abd pain: UTI, constipation, UTI, AAA, dissection, diverticulitis, gallbladder pathology. HD stable, no signs of GIB at this time.  - SOB: h/o CHF and COPD, has pitting edema and severely reduced breath sounds bilaterally.  ddx COPD, bronchitis, PNA, covid, flu, CHF.  Plan: CBC, CMP, lipase, troponin, CXR, CT a/p, UA. Will give 3 duo nebs, fentanyl for pain, and Zofran.  Patient placed on telemetry for close monitoring.  Old medical records reviewed.   _________________________ 7:58 AM on 07/12/2020 -----------------------------------------  Unfortunately patient CT is concerning for pretty extensive spread malignancy with a large lung mass, rectal mass, and several metastatic lesions.  I explained to the patient the findings and also talked to her son who is her healthcare power of attorney.  At this time we have decided to do a palliative care consult.  The son is on his way to the hospital so they can make a final decision on the plan and management from now on.  Palliative care has been consulted.  Care transferred to Dr. Corky Downs.     _____________________________________________ Please note:  Patient was evaluated in Emergency Department today for the symptoms described in the history of present illness. Patient was evaluated in the context of the global COVID-19 pandemic, which necessitated consideration that the patient might be at  risk for infection with the SARS-CoV-2 virus that causes COVID-19. Institutional protocols and algorithms that pertain to the evaluation of patients at risk for COVID-19 are in a state of rapid change based on information released by regulatory bodies including the CDC and federal and state organizations. These policies and algorithms were followed during the patient's care in the ED.  Some ED evaluations and interventions may be delayed as a result of limited staffing during the pandemic.   [place] Controlled Substance Database was reviewed by me. ____________________________________________   FINAL CLINICAL IMPRESSION(S) / ED DIAGNOSES   Final diagnoses:  Primary malignant neoplasm of lung metastatic to other site, unspecified laterality ([place])  Rectal cancer ([place])      NEW MEDICATIONS STARTED DURING THIS VISIT:  ED Discharge Orders    None       Note:  This document was prepared using Dragon voice recognition software and may include unintentional dictation errors.    Alfred Levins, [place], MD 07/12/20 0800

## 2020-07-12 NOTE — ED Provider Notes (Signed)
Patient seen by palliative care, she and son amenable to hospice home but no beds available at this time. Son cannot care for patient at home. Have discussed with hospitalist service   Lavonia Drafts, MD 07/12/20 1143

## 2020-07-12 NOTE — ED Notes (Signed)
Receiving RN messaged regarding admission.   Per unit, they are awaiting covid swab results, then will take pt

## 2020-07-12 NOTE — ED Notes (Signed)
Pt currently in ct scan

## 2020-07-12 NOTE — ED Triage Notes (Signed)
Pt BIB EMS after pt had a "large red bowl movement" and pt being less responsive. Blood glucose was 114 for EMS. Pt endorses abdominal pain.  Pt on 2LPM of oxygen via [place]. EMS states family is coming

## 2020-07-12 NOTE — ED Notes (Signed)
Covid swab still in process. Pt asleep

## 2020-07-12 NOTE — ED Notes (Addendum)
Provider notified  of BP of 187/87

## 2020-07-12 NOTE — ED Notes (Signed)
Pt asleep, resting comfortably. VS stable, resting comfortably,

## 2020-07-12 NOTE — ED Notes (Signed)
ED TO INPATIENT HANDOFF REPORT  ED Nurse Name and Phone #: Shondale Quinley [phone]  S Name/Age/Gender Tasha Reyes 86 y.o. female Room/Bed: ED26A/ED26A  Code Status   Code Status: DNR  Home/SNF/Other Skilled nursing facility Patient oriented to: self, place and time Is this baseline? Yes   Triage Complete: Triage complete  Chief Complaint Malignancy [facility]) [C80.1]  Triage Note Pt BIB EMS after pt had a "large red bowl movement" and pt being less responsive. Blood glucose was 114 for EMS. Pt endorses abdominal pain.  Pt on 2LPM of oxygen via [place]. EMS states family is coming     Allergies Allergies  Allergen Reactions  . Other Nausea And Vomiting    Cherries (fruit)    Level of Care/Admitting Diagnosis ED Disposition    ED Disposition Condition [facility]: [place] [[id number]]  Level of Care: Med-Surg [16]  Covid Evaluation: Asymptomatic Screening Protocol (No Symptoms)  Diagnosis: Malignancy [facility]) [[id number]]  Admitting Physician: Ivor Costa [[id number]]  Attending Physician: Ivor Costa [phone]  Estimated length of stay: past midnight tomorrow  Certification:: I certify this patient will need inpatient services for at least 2 midnights       B Medical/Surgery History Past Medical History:  Diagnosis Date  . Acute on chronic respiratory failure ([place]) 05/20/2018  . Aneurysm of iliac artery ([facility]) 05/11/2015  . CHF (congestive heart failure) ([place])   . COPD (chronic obstructive pulmonary disease) ([place])   . Cystitis   . History of DVT (deep vein thrombosis) 05/11/2015  . History of peptic ulcer disease 05/11/2015  . [name]   . Hypertension   . Insomnia   . Losing weight   . Migraines   . [name]   . Rectal bleeding   . Stroke ([place])   . Vascular disease    Past Surgical History:  Procedure Laterality Date  . ABDOMINAL AORTIC ANEURYSM REPAIR W/ ENDOLUMINAL GRAFT  2008  . ABDOMINAL HYSTERECTOMY    .  APPENDECTOMY    . BACK SURGERY    . blood clot      removal right leg  . COLONOSCOPY  2012  . ESOPHAGOGASTRODUODENOSCOPY  04/2013   gastric ulcers  . FEMORAL-POPLITEAL BYPASS GRAFT Right 2008  . HIP ARTHROPLASTY Right 06/17/2017   Procedure: ARTHROPLASTY BIPOLAR HIP (HEMIARTHROPLASTY);  Surgeon: Earnestine Leys, MD;  Location: [facility] ORS;  Service: Orthopedics;  Laterality: Right;  . JOINT REPLACEMENT Right 06/19/2017   right hip FX.  Marland Kitchen PARTIAL HYSTERECTOMY       A IV Location/Drains/Wounds Patient Lines/Drains/Airways Status    Active Line/Drains/Airways    Name Placement date Placement time Site Days   Peripheral IV 07/12/20 Right Arm 07/12/20  0329  Arm  less than 1   Peripheral IV 07/12/20 Left Arm 07/12/20  0339  Arm  less than 1          Intake/Output Last 24 hours No intake or output data in the 24 hours ending 07/12/20 1155  Labs/Imaging Results for orders placed or performed during the hospital encounter of 07/12/20 (from the past 48 hour(s))  CBC with Differential     Status: Abnormal   Collection Time: 07/12/20  3:35 AM  Result Value Ref Range   WBC 15.4 (H) 4.0 - 10.5 K/uL   RBC 4.01 3.87 - 5.11 MIL/uL   Hemoglobin 11.8 (L) 12.0 - 15.0 g/dL   HCT 37.7 36.0 - 46.0 %   MCV 94.0 80.0 - 100.0 fL  MCH 29.4 26.0 - 34.0 pg   MCHC 31.3 30.0 - 36.0 g/dL   RDW 17.7 (H) 11.5 - 15.5 %   Platelets 575 (H) 150 - 400 K/uL   nRBC 0.0 0.0 - 0.2 %   Neutrophils Relative % 86 %   Neutro Abs 13.2 (H) 1.7 - 7.7 K/uL   Lymphocytes Relative 9 %   Lymphs Abs 1.4 0.7 - 4.0 K/uL   Monocytes Relative 4 %   Monocytes Absolute 0.6 0.1 - 1.0 K/uL   Eosinophils Relative 0 %   Eosinophils Absolute 0.1 0.0 - 0.5 K/uL   Basophils Relative 0 %   Basophils Absolute 0.1 0.0 - 0.1 K/uL   Immature Granulocytes 1 %   Abs Immature Granulocytes 0.08 (H) 0.00 - 0.07 K/uL    Comment: Performed at [facility], [address] [place], [place] [address]  Comprehensive metabolic panel      Status: Abnormal   Collection Time: 07/12/20  3:35 AM  Result Value Ref Range   Sodium 141 135 - 145 mmol/L   Potassium 3.4 (L) 3.5 - 5.1 mmol/L   Chloride 105 98 - 111 mmol/L   CO2 27 22 - 32 mmol/L   Glucose, Bld 106 (H) 70 - 99 mg/dL    Comment: Glucose reference range applies only to samples taken after fasting for at least 8 hours.   BUN 18 8 - 23 mg/dL   Creatinine, Ser 0.73 0.44 - 1.00 mg/dL   Calcium 7.8 (L) 8.9 - 10.3 mg/dL   Total Protein 6.5 6.5 - 8.1 g/dL   Albumin 2.5 (L) 3.5 - 5.0 g/dL   AST 103 (H) 15 - 41 U/L   ALT 35 0 - 44 U/L   Alkaline Phosphatase 114 38 - 126 U/L   Total Bilirubin 0.8 0.3 - 1.2 mg/dL   GFR, Estimated >60 >60 mL/min    Comment: (NOTE) Calculated using the CKD-EPI Creatinine Equation (2021)    Anion gap 9 5 - 15    Comment: Performed at [facility], [address] [place], [place] [address]  Lipase, blood     Status: Abnormal   Collection Time: 07/12/20  3:35 AM  Result Value Ref Range   Lipase 60 (H) 11 - 51 U/L    Comment: Performed at [facility], [address] [place],  [address]   CT CHEST ABDOMEN PELVIS W CONTRAST  Result Date: 07/12/2020 CLINICAL DATA:  86 year old female with "large read bowel movement", decreased mental status, Chronic Emphysema (ICD10-J43.9) with widespread abnormal right lung opacity and right lung volume loss on chest x-ray new since 2019. EXAM: CT CHEST, ABDOMEN, AND PELVIS WITH CONTRAST TECHNIQUE: Multidetector CT imaging of the chest, abdomen and pelvis was performed following the standard protocol during bolus administration of intravenous contrast. CONTRAST:  39mL OMNIPAQUE IOHEXOL 300 MG/ML  SOLN COMPARISON:  Chest CT 11/12/2011.  CT Abdomen and Pelvis 09/06/2017. FINDINGS: CT CHEST FINDINGS Cardiovascular: Chronic Calcified aortic atherosclerosis. Mild right atrial enlargement. No pericardial effusion. Central pulmonary arteries appear to be patent. Mediastinum/Nodes: Chronic  exophytic thyroid goiter at the thoracic inlet, stable since 2013. New bulky lymph node metastasis or metastatic tumor abutting the mediastinum along the left lateral margin of the ascending aorta on series 2, image 28, 22 mm. Heterogeneously enhancing lobulated tumor inseparable from the right hilum (series 2 image 31, 28). Tumor size up to 6.5 cm long axis (sagittal image 48, coronal image 60. Additional maximal 10 mm right precarinal lymph node  suspicious for small nodal metastasis. Lungs/Pleura: Moderate size layering right pleural effusion with simple fluid density superimposed on tumor extending from the right hilum to the peripheral pleura of the right mid chest (series 2, image 33). Surrounding lung consolidation. Short segment occlusion of the right upper lobe bronchus is associated on series 4, image 70. Superimposed enhancing atelectatic lung in the right middle and lower lobes. Underlying extensive centrilobular emphysema. Other major airways remain patent. New irregular and mildly spiculated opacity also in the left upper lung encompassing 10-15 mm (series 4, image 47. This is indeterminate for scarring versus synchronous primary. Musculoskeletal: Osteopenia. Right lateral ribs in proximity to the lung tumor remain intact. No acute or suspicious osseous lesion identified in the chest. CT ABDOMEN PELVIS FINDINGS Hepatobiliary: Chronic gallbladder sludge and/or stones. Patulous gallbladder today but no definite pericholecystic inflammation. Heterogeneous liver enhancement with no definite hepatic metastatic disease. Abnormal contour of the liver dome appears related to overlying pleural fluid and mild mass effect on the diaphragm, and abnormal pleural thickening and enhancement is identified on coronal image 29. Pancreas: Negative. Spleen: Negative. Adrenals/Urinary Tract: Adrenal glands remain within normal limits. Kidneys remain within normal limits. Urinary bladder mostly obscured by hip arthroplasty  artifact. Stomach/Bowel: Lobular and enhancing rectal mass (series 2, image 123) new since 2019. This has indistinct margins, and is partially obscured by artifact from right hip arthroplasty, but is grossly estimated at 6.6 cm on series 2, image 120. No upstream dilated bowel. Decompressed sigmoid colon with extensive diverticulosis. Decompressed descending colon. Somewhat featureless large bowel loops elsewhere. Cecum partially located in the pelvis containing stool. Negative terminal ileum. Appendix cannot be delineated. Mostly decompressed stomach. No free air.  No free fluid. Vascular/Lymphatic: Extensive Calcified aortic atherosclerosis. Chronic partially thrombosed distal aortic endograft is stable. The right iliac limb is chronically thrombosed. Reconstituted flow in the right iliac artery bifurcation. Furthermore, there is a chronic femoral bypass which has thrombosed since 2019 (series 2, image 99). No other acute arterial thrombosis identified in the abdomen or pelvis. Portal venous system is diminutive but patent. [id number] No lymphadenopathy identified in the abdomen. No definite lymphadenopathy in the pelvis. Reproductive: Uterus appears to be chronically absent. There are chronic bilateral ovarian cysts which have simple fluid density (series 2, image 103) and have not significantly changed since 2019 (series 2, image 99 on the left). Other: Pelvis partially obscured by artifact from right hip arthroplasty. No definite pelvic free fluid. Musculoskeletal: Mild L2 superior endplate compression fracture is new since 2019 but has a chronic appearance. Chronic L4-L5 advanced disc and endplate degeneration. No acute or suspicious osseous lesion identified. Chronic right hip arthroplasty. IMPRESSION: 1. Advanced Malignancy in the chest. - lobulated and enhancing tumor encompassing up to 6.5 cm extending from the right hilum to the peripheral pleural surface. - abnormal enhancing pleura along the diaphragm.  Associated small to moderate right pleural effusion (partially sub pulmonic). -mediastinal metastases. - a spiculated left upper lobe lung nodule indeterminate for scarring versus a synchronous primary carcinoma. - Emphysema (ICD10-J43.9). 2. Superimposed primary Rectal Carcinoma also suspected, with an indistinct thick and enhancing rectal tumor estimated at 6.6 cm. But no metastatic disease is identified in the abdomen. 3. Severe chronic vasculopathy also with thrombosis of a femoral artery bypass since 2019. 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 Extensive Aortic Atherosclerosis (ICD10-I70.0). 4. Cholelithiasis with patulous gallbladder but no strong evidence of acute cholecystitis by CT. 5. Mild chronic appearing L2 compression fracture is new since 2019. Electronically  Signed   By: Genevie Ann M.D.   On: 07/12/2020 06:58   DG Chest Portable 1 View  Result Date: 07/12/2020 CLINICAL DATA:  86 year old female with shortness of breath. EXAM: PORTABLE CHEST 1 VIEW COMPARISON:  Chest radiographs 06/30/2018 and earlier. FINDINGS: Portable AP upright view at 0345 hours. Chronic pulmonary hyperinflation with centrilobular emphysema. Moderately reduced volume in the right lung with widespread abnormal right lung opacity is new since 2019. Evidence of both airspace consolidation and right pleural effusion. Mediastinum somewhat deviated to the right. No pneumothorax. Left lung appears to remain clear. Calcified aortic atherosclerosis. Visualized tracheal air column is within normal limits. No acute osseous abnormality identified. IMPRESSION: Chronic Emphysema (ICD10-J43.9) with widespread abnormal right lung opacity and right lung volume loss, all new since 2019. Recommend Chest CT (IV contrast preferred) to further characterize. Electronically Signed   By: Genevie Ann M.D.   On: 07/12/2020 04:04    Pending Labs Unresulted Labs (From admission, onward)          Start     Ordered   07/12/20  1057  SARS CORONAVIRUS 2 (TAT 6-24 HRS) Nasopharyngeal Nasopharyngeal Swab  (Tier 3 - Asymptomatic with Precautions)  Once,   STAT       Question Answer Comment  Is this test for diagnosis or screening Screening   Symptomatic for COVID-19 as defined by CDC No   Hospitalized for COVID-19 No   Admitted to ICU for COVID-19 No   Previously tested for COVID-19 No   Resident in a congregate (group) care setting No   Employed in healthcare setting No   Pregnant No   Has patient completed COVID vaccination(s) (2 doses of Pfizer/Moderna 1 dose of [employer]) Unknown      07/12/20 1057   07/12/20 0337  Urinalysis, Complete w Microscopic  ONCE - STAT,   STAT        07/12/20 0337          Vitals/Pain Today's Vitals   07/12/20 0930 07/12/20 1000 07/12/20 1100 07/12/20 1154  BP:  (!) 173/90 (!) 162/63 (!) 162/63  Pulse:    79  Resp: 18 15 12 14   Temp:    98 F (36.7 C)  TempSrc:    Oral  SpO2: 97%   93%  PainSc:    Asleep    Isolation Precautions Airborne and Contact precautions  Medications Medications  acetaminophen (TYLENOL) tablet 650 mg (has no administration in time range)    Or  acetaminophen (TYLENOL) suppository 650 mg (has no administration in time range)  haloperidol (HALDOL) tablet 0.5 mg (has no administration in time range)    Or  haloperidol (HALDOL) 2 MG/ML solution 0.5 mg (has no administration in time range)    Or  haloperidol lactate (HALDOL) injection 0.5 mg (has no administration in time range)  ondansetron (ZOFRAN-ODT) disintegrating tablet 4 mg (has no administration in time range)    Or  ondansetron (ZOFRAN) injection 4 mg (has no administration in time range)  glycopyrrolate (ROBINUL) tablet 1 mg (has no administration in time range)    Or  glycopyrrolate (ROBINUL) injection 0.2 mg (has no administration in time range)    Or  glycopyrrolate (ROBINUL) injection 0.2 mg (has no administration in time range)  antiseptic oral rinse (BIOTENE) solution  15 mL (has no administration in time range)  polyvinyl alcohol (LIQUIFILM TEARS) 1.4 % ophthalmic solution 1 drop (has no administration in time range)  morphine 2 MG/ML injection 1 mg (has no  administration in time range)  LORazepam (ATIVAN) tablet 1 mg (has no administration in time range)    Or  LORazepam (ATIVAN) 2 MG/ML concentrated solution 1 mg (has no administration in time range)    Or  LORazepam (ATIVAN) injection 0.5 mg (has no administration in time range)  ipratropium-albuterol (DUONEB) 0.5-2.5 (3) MG/3ML nebulizer solution 3 mL (3 mLs Nebulization Given 07/12/20 0350)  ipratropium-albuterol (DUONEB) 0.5-2.5 (3) MG/3ML nebulizer solution 3 mL (3 mLs Nebulization Given 07/12/20 0350)  ipratropium-albuterol (DUONEB) 0.5-2.5 (3) MG/3ML nebulizer solution 3 mL (3 mLs Nebulization Given 07/12/20 0349)  fentaNYL (SUBLIMAZE) injection 50 mcg (50 mcg Intravenous Given 07/12/20 0432)  ondansetron (ZOFRAN) injection 4 mg (4 mg Intravenous Given 07/12/20 0434)  iohexol (OMNIPAQUE) 300 MG/ML solution 75 mL (75 mLs Intravenous Contrast Given 07/12/20 0608)    Mobility walks with device Moderate fall risk   Focused Assessments Pulmonary Assessment Handoff:  Lung sounds: L Breath Sounds: Diminished R Breath Sounds: Diminished O2 Device: Nasal Cannula O2 Flow Rate (L/min): 2 L/min      R Recommendations: See Admitting Provider Note  Report given to:   Additional Notes: Pt to be d/c'd to palliative care

## 2020-07-12 NOTE — ED Notes (Signed)
Pt still asleep at this time

## 2020-07-12 NOTE — ED Notes (Signed)
ER provider stated she will be calling the pts son shortly.

## 2020-07-12 NOTE — ED Notes (Signed)
EKG completed and given to provider.

## 2020-07-12 NOTE — Consult Note (Addendum)
Consultation Note Date: 07/12/2020   Patient Name: Tasha Reyes  DOB: 1931-05-21  MRN: [id number]  Age / Sex: 86 y.o., female  PCP: Tasha Hess, MD Referring Physician: Lavonia Drafts, MD  Reason for Consultation: Establishing goals of care  HPI/Patient Profile: Tasha Reyes is a 86 y.o. female with a history of AAA status post repair, COPD on 2 L nasal cannula, DVT, malnutrition, CHF who presents from home for abdominal pain.  According to EMS, patient's son called 911 after she was complaining of abdominal pain and had a large bloody bowel movement.  No prior history of GI bleeds.  Patient is not on any blood thinners.  Patient has some dried emesis on her clothing.  She is complaining of diffuse abdominal pain.   Clinical Assessment and Goals of Care: Patient is resting in bed at this time. She appears weak and frail.  She is able to tell me her name, that she is at the hospital, that the year is 2021, and that she is here because she has been vomiting.    She states that she lives in her home and her son lives with her.  She states that she is able to cook and clean but is unable to drive.  She states that she uses oxygen and has done so for the past few years.  Her son entered at this time.  He states that he is the patient's healthcare power of attorney and next of kin.  He states that he has no other siblings.    We discussed her diagnoses, prognosis, GOC, EOL wishes disposition and options. Reviewed her symptoms at home leading to coming to the ED. Patient becomes confused when talking about her health stating that she did not indeed have vomiting, though per notes she had dried emesis on her clothing when she came to the hospital.  She states she had loose stool, with no blood present.   He discusses that she is followed by the pain clinic.  He is concerned about "being an enabler" as  he states this is what he was told when his daughter died around a year ago secondary to fentanyl overdose.  He states over the past few months that she has declined, only wanting to lay on the couch and rest.    He states over the past 3 weeks she has gradually complained of losing the ability to use her legs and her left arm due to weakness.  He states that she talks and "will give out of air and pass out for 10 to 30 minutes at a time" before coming back around.  He states that she has refused to see a provider, or come to the hospital and has stated that she just wants to die at home.  He states she has been sleeping on the couch.  He states that she has only been eating bites and sips, and is having difficulty swallowing pills and food other than jello.  He shows me her toes which are  dark in color; he states that is due to her feet chronically hanging off the couch.  We discussed the swelling in her ankles and feet, and that hanging them off the couch and poor p.o. intake would influence this.    A detailed discussion was had today regarding advanced directives.  Concepts specific to code status, artifical feeding and hydration, IV antibiotics and rehospitalization were discussed.  The difference between an aggressive medical intervention path and a comfort care path was discussed.  Values and goals of care important to patient and family were attempted to be elicited.  Discussed limitations of medical interventions to prolong quality of life in some situations and discussed the concept of human mortality.  Educated patient and son on natural trajectory of illness, and expectations at EOL were discussed.   Patient states that she wants to focus on comfort until she dies.  She states that she no longer wants any life prolonging care.  Son states he agrees with this, stating that he was going to abide by her wishes and keep her at home until she dies, however with the bloody BM, he felt she needed to  come to the hospital. She states he is no longer able to provide her care at home.  He states that he would like for her to go to the hospice facility.  Patient states that she wants to go home with hospice.  Upon further discussion patient is amenable to transitioning to the hospice facility.  I completed a MOST form today and the signed original was placed in the chart.  It was signed by patient's son. A photocopy was placed in the chart to be scanned into EMR. The patient outlined their wishes for the following treatment decisions:  Cardiopulmonary Resuscitation: Do Not Attempt Resuscitation (DNR/No CPR)  Medical Interventions: Comfort Measures: Keep clean, warm, and dry. Use medication by any route, positioning, wound care, and other measures to relieve pain and suffering. Use oxygen, suction and manual treatment of airway obstruction as needed for comfort. Do not transfer to the hospital unless comfort needs cannot be met in current location.  Antibiotics: No antibiotics (use other measures to relieve symptoms)  IV Fluids: No IV fluids (provide other measures to ensure comfort)  Feeding Tube: No feeding tube       SUMMARY OF RECOMMENDATIONS   Patient shifting to full comfort care.  Recommendation for hospice facility made. Hospice liaison, EDP, RN notified.   Prognosis:   < 2 weeks Bites and sips and cachectic. New concern for cancer.        Primary Diagnoses: Present on Admission: **None**   I have reviewed the medical record, interviewed the patient and family, and examined the patient. The following aspects are pertinent.  Past Medical History:  Diagnosis Date  . Acute on chronic respiratory failure ([place]) 05/20/2018  . Aneurysm of iliac artery ([facility]) 05/11/2015  . CHF (congestive heart failure) ([place])   . COPD (chronic obstructive pulmonary disease) ([place])   . Cystitis   . History of DVT (deep vein thrombosis) 05/11/2015  . History of peptic ulcer disease 05/11/2015  .  [name]   . Hypertension   . Insomnia   . Losing weight   . Migraines   . [name]   . Rectal bleeding   . Stroke ([place])   . Vascular disease    Social History   Socioeconomic History  . Marital status: Widowed    Spouse name: Not on file  .  Number of children: 1  . Years of education: Not on file  . Highest education level: Associate degree: academic program  Occupational History  . Not on file  Tobacco Use  . Smoking status: Current Every Day Smoker    Packs/day: 0.10    Years: 61.00    Pack years: 6.10    Types: Cigarettes  . Smokeless tobacco: Never Used  . Tobacco comment: 4 cigrettes a day- cut back  Vaping Use  . Vaping Use: Never used  Substance and Sexual Activity  . Alcohol use: No    Alcohol/week: 0.0 standard drinks  . Drug use: No  . Sexual activity: Not Currently    Birth control/protection: Post-menopausal  Other Topics Concern  . Not on file  Social History Narrative  . Not on file   Social Determinants of Health   Financial Resource Strain: Not on file  Food Insecurity: Not on file  Transportation Needs: Not on file  Physical Activity: Not on file  Stress: Not on file  Social Connections: Not on file   Family History  Problem Relation Age of Onset  . Brain cancer Father   . Kidney disease Mother   . Colon cancer Brother    Scheduled Meds: Continuous Infusions: PRN Meds:. Medications Prior to Admission:  Prior to Admission medications   Medication Sig Start Date End Date Taking? Authorizing Provider  albuterol (PROVENTIL) (2.5 MG/3ML) 0.083% nebulizer solution USE 1 VIAL VIA NEBULIZER EVERY 8 HOURS 01/29/20   Tasha Hess, MD  atenolol (TENORMIN) 25 MG tablet TAKE 1 TABLET BY MOUTH TWICE DAILY 06/30/20   Tasha Hess, MD  atorvastatin (LIPITOR) 10 MG tablet TAKE 1 TABLET(10 MG) BY MOUTH AT BEDTIME 06/20/19   Tasha Hess, MD  clopidogrel (PLAVIX) 75 MG tablet TAKE 1 TABLET BY MOUTH DAILY 06/21/19    Kris Hartmann, NP  furosemide (LASIX) 20 MG tablet TAKE 1 TABLET(20 MG) BY MOUTH DAILY 02/15/20   Tasha Hess, MD  HYDROcodone-acetaminophen (NORCO/VICODIN) 5-325 MG tablet Take 1 tablet by mouth every 8 (eight) hours as needed for severe pain. Must last 15 days 05/06/20 06/05/20  Milinda Pointer, MD  HYDROcodone-acetaminophen (NORCO/VICODIN) 5-325 MG tablet Take 1 tablet by mouth every 8 (eight) hours as needed for severe pain. Must last 15 days 06/05/20 07/05/20  Milinda Pointer, MD  HYDROcodone-acetaminophen (NORCO/VICODIN) 5-325 MG tablet Take 1 tablet by mouth every 8 (eight) hours as needed for severe pain. Must last 15 days 07/05/20 08/04/20  Milinda Pointer, MD  hydrOXYzine (VISTARIL) 25 MG capsule Take 1 capsule (25 mg total) by mouth 3 (three) times daily as needed for anxiety. 11/17/19   Tasha Hess, MD  meclizine (ANTIVERT) 25 MG tablet Take 1 tablet (25 mg total) by mouth 3 (three) times daily as needed for dizziness or nausea. 01/20/20   Earleen Newport, MD  ondansetron (ZOFRAN-ODT) 8 MG disintegrating tablet DISSOLVE 1 TABLET(8 MG) ON THE TONGUE TWICE DAILY FOR 15 DAYS 04/03/20   Tasha Hess, MD  OXYGEN Inhale 2 L/min into the lungs. Use via nasal cannula at night and during the day as needed    [provider]  pantoprazole (PROTONIX) 40 MG tablet TAKE 1 TABLET(40 MG) BY MOUTH TWICE DAILY 09/18/19   Tasha Hess, MD   Allergies  Allergen Reactions  . Other Nausea And Vomiting    Cherries (fruit)   Review of Systems  Constitutional:       Leg pain.   All other  systems reviewed and are negative.   Physical Exam Pulmonary:     Effort: Pulmonary effort is normal.  Neurological:     Mental Status: She is alert.     Vital Signs: BP (!) 158/76   Pulse 82   Temp 98.1 F (36.7 C) (Oral)   Resp 14   SpO2 97%  Pain Scale: 0-10   Pain Score: Asleep   SpO2: SpO2: 97 % O2 Device:SpO2: 97 % O2 Flow Rate: .O2 Flow Rate (L/min): 2  L/min  IO: Intake/output summary: No intake or output data in the 24 hours ending 07/12/20 1010  LBM:   Baseline Weight:   Most recent weight:       Palliative Assessment/Data:     Time In: 9:00 Time Out: 10:10 Time Total: 70 min Greater than 50%  of this time was spent counseling and coordinating care related to the above assessment and plan.  Signed by: Asencion Gowda, NP   Please contact Palliative Medicine Team phone at [phone] for questions and concerns.  For individual provider: See Shea Evans

## 2020-07-12 NOTE — ED Notes (Signed)
Pt repositioned in bed, DNR band placed, covid swab completed. Pt polite, agreeable, denies further needs at this time  Returns to sleep

## 2020-07-12 NOTE — ED Notes (Signed)
RN called pts son with pts permission. Sons name is Jonise Weightman at [phone].  As per son pt had several bowel movements today with the largest "8-9 pounds" as per the son. Son stated at this time he is not planning on coming in, but will wait for another call.

## 2020-07-12 NOTE — ED Notes (Signed)
This RN and Female nurse tech went into room to do an in and out catheter to obtain urine specimen. No urine came out during in and out attempt. Pt stated some pain and discomfort. Pt placed on purewick and ER provider notified. Pt repositioned onto her right side.

## 2020-07-12 NOTE — ED Notes (Signed)
Pt is up and talking more than when she first came in. Pt states she does not recall why she was brought into the ED, but does state her abdominal pain is getting better.

## 2020-07-12 NOTE — ED Notes (Addendum)
Pt resting in bed with cardiac, bp and pulse ox monitor. Pt is able verbalize abdominal pain. Pt able to say the holiday we just celebrated was christmas and we are in December and that she is at the hospital. Pt is calm and cooperative with hospital staff.  Pt noted to have skin break down to the buttock, no bleeding noted.

## 2020-07-12 NOTE — H&P (Addendum)
History and Physical    [name] XQJ:[id number] DOB: Oct 27, 1930 DOA: 07/12/2020  Referring MD/NP/PA:   PCP: Glean Hess, MD   Patient coming from:  The patient is coming from home.  At baseline, pt is dependent for most of ADL.        Chief Complaint: abdominal pain  HPI: Tasha Reyes is a 86 y.o. female with medical history significant of  AAA status post repair, COPD on 2 L nasal cannula, DVT not on anticoagulants, malnutrition, dCHF, hypertension, hyperlipidemia, stroke, GERD, depression, anxiety, PUD, rectal bleeding, migraine headache, who presents with abdominal pain.  Per her son, pt was complaining of abdominal pain and had a large bloody bowel movement today. Pt is on plavix for hx of stroke.  Patient is some nausea, denies vomiting.  She states that she had diarrhea recently, but not today. Her abdominal pain is diffuse, constant, moderate, sharp, nonradiating. Patient has mild shortness of breath, denies chest pain, cough, fever or chills. Her oxygen saturation is 97% on home level of 2 L oxygen.  No symptoms of UTI.  ED Course: pt was found to have WBC 15.4, pending COVID-19 PCR, potassium 3.4, renal function okay, abnormal liver function (ALP 114, AST 103, AST 35, total bilirubin 0.8), temperature normal, blood pressure 162/63, heart rate 82, RR 23.  Chest x-ray showed COPD.  Patient is admitted to [facility] bed as inpatient.   CT of abdomen/pelvis/chest 1. Advanced Malignancy in the chest. - lobulated and enhancing tumor encompassing up to 6.5 cm extending from the right hilum to the peripheral pleural surface. - abnormal enhancing pleura along the diaphragm. Associated small to moderate right pleural effusion (partially sub pulmonic). -mediastinal metastases. - a spiculated left upper lobe lung nodule indeterminate for scarring versus a synchronous primary carcinoma. - Emphysema (ICD10-J43.9).  2. Superimposed primary Rectal Carcinoma also suspected,  with an indistinct thick and enhancing rectal tumor estimated at 6.6 cm. But no metastatic disease is identified in the abdomen.  3. Severe chronic vasculopathy also with thrombosis of a femoral artery bypass since 2019. 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 Extensive Aortic Atherosclerosis (ICD10-I70.0).  4. Cholelithiasis with patulous gallbladder but no strong evidence of acute cholecystitis by CT.  5. Mild chronic appearing L2 compression fracture is new since 2019.  Review of Systems:   General: no fevers, chills, no body weight gain, has poor appetite, has fatigue HEENT: no blurry vision, hearing changes or sore throat Respiratory: has dyspnea, no coughing, wheezing CV: no chest pain, no palpitations GI: has nausea, abdominal pain, diarrhea,  No vomiting, constipation GU: no dysuria, burning on urination, increased urinary frequency, hematuria  Ext: no leg edema Neuro: no unilateral weakness, numbness, or tingling, no vision change or hearing loss Skin: no rash, no skin tear. MSK: No muscle spasm, no deformity, no limitation of range of movement in spin Heme: No easy bruising.  Travel history: No recent long distant travel.  Allergy:  Allergies  Allergen Reactions  . Other Nausea And Vomiting    Cherries (fruit)    Past Medical History:  Diagnosis Date  . Acute on chronic respiratory failure ([place]) 05/20/2018  . Aneurysm of iliac artery (HCC) 05/11/2015  . CHF (congestive heart failure) ([place])   . COPD (chronic obstructive pulmonary disease) ([place])   . Cystitis   . History of DVT (deep vein thrombosis) 05/11/2015  . History of peptic ulcer disease 05/11/2015  . [name]   . Hypertension   . Insomnia   .  Losing weight   . Migraines   . [name]   . Rectal bleeding   . Stroke ([place])   . Vascular disease     Past Surgical History:  Procedure Laterality Date  . ABDOMINAL AORTIC ANEURYSM REPAIR W/  ENDOLUMINAL GRAFT  2008  . ABDOMINAL HYSTERECTOMY    . APPENDECTOMY    . BACK SURGERY    . blood clot      removal right leg  . COLONOSCOPY  2012  . ESOPHAGOGASTRODUODENOSCOPY  04/2013   gastric ulcers  . FEMORAL-POPLITEAL BYPASS GRAFT Right 2008  . HIP ARTHROPLASTY Right 06/17/2017   Procedure: ARTHROPLASTY BIPOLAR HIP (HEMIARTHROPLASTY);  Surgeon: Earnestine Leys, MD;  Location: [facility] ORS;  Service: Orthopedics;  Laterality: Right;  . JOINT REPLACEMENT Right 06/19/2017   right hip FX.  Marland Kitchen PARTIAL HYSTERECTOMY      Social History:  reports that she has been smoking cigarettes. She has a 6.10 pack-year smoking history. She has never used smokeless tobacco. She reports that she does not drink alcohol and does not use drugs.  Family History:  Family History  Problem Relation Age of Onset  . Brain cancer Father   . Kidney disease Mother   . Colon cancer Brother      Prior to Admission medications   Medication Sig Start Date End Date Taking? Authorizing Provider  albuterol (PROVENTIL) (2.5 MG/3ML) 0.083% nebulizer solution USE 1 VIAL VIA NEBULIZER EVERY 8 HOURS 01/29/20   Glean Hess, MD  atenolol (TENORMIN) 25 MG tablet TAKE 1 TABLET BY MOUTH TWICE DAILY 06/30/20   Glean Hess, MD  atorvastatin (LIPITOR) 10 MG tablet TAKE 1 TABLET(10 MG) BY MOUTH AT BEDTIME 06/20/19   Glean Hess, MD  clopidogrel (PLAVIX) 75 MG tablet TAKE 1 TABLET BY MOUTH DAILY 06/21/19   Kris Hartmann, NP  furosemide (LASIX) 20 MG tablet TAKE 1 TABLET(20 MG) BY MOUTH DAILY 02/15/20   Glean Hess, MD  HYDROcodone-acetaminophen (NORCO/VICODIN) 5-325 MG tablet Take 1 tablet by mouth every 8 (eight) hours as needed for severe pain. Must last 15 days 05/06/20 06/05/20  Milinda Pointer, MD  HYDROcodone-acetaminophen (NORCO/VICODIN) 5-325 MG tablet Take 1 tablet by mouth every 8 (eight) hours as needed for severe pain. Must last 15 days 06/05/20 07/05/20  Milinda Pointer, MD   HYDROcodone-acetaminophen (NORCO/VICODIN) 5-325 MG tablet Take 1 tablet by mouth every 8 (eight) hours as needed for severe pain. Must last 15 days 07/05/20 08/04/20  Milinda Pointer, MD  hydrOXYzine (VISTARIL) 25 MG capsule Take 1 capsule (25 mg total) by mouth 3 (three) times daily as needed for anxiety. 11/17/19   Glean Hess, MD  meclizine (ANTIVERT) 25 MG tablet Take 1 tablet (25 mg total) by mouth 3 (three) times daily as needed for dizziness or nausea. 01/20/20   Earleen Newport, MD  ondansetron (ZOFRAN-ODT) 8 MG disintegrating tablet DISSOLVE 1 TABLET(8 MG) ON THE TONGUE TWICE DAILY FOR 15 DAYS 04/03/20   Glean Hess, MD  OXYGEN Inhale 2 L/min into the lungs. Use via nasal cannula at night and during the day as needed    [provider]  pantoprazole (PROTONIX) 40 MG tablet TAKE 1 TABLET(40 MG) BY MOUTH TWICE DAILY 09/18/19   Glean Hess, MD    Physical Exam: Vitals:   07/12/20 1000 07/12/20 1100 07/12/20 1154 07/12/20 1349  BP: (!) 173/90 (!) 162/63 (!) 162/63 (!) 162/91  Pulse:   79 94  Resp: 15 12 14  16  Temp:   98 F (36.7 C) 98.8 F (37.1 C)  TempSrc:   Oral   SpO2:   93%   Weight:    39.8 kg  Height:    5\' 3"  (1.6 m)   General: Not in acute distress.  Very thin body habitus HEENT:       Eyes: PERRL, EOMI, no scleral icterus.       ENT: No discharge from the ears and nose, no pharynx injection, no tonsillar enlargement.        Neck: No JVD, no bruit, no mass felt. Heme: No neck lymph node enlargement. Cardiac: S1/S2, RRR, No murmurs, No gallops or rubs. Respiratory:  No rales, wheezing, rhonchi or rubs. GI: Soft, nondistended, has diffused tenderness, no rebound pain, no organomegaly, BS present. GU: No hematuria Ext: No pitting leg edema bilaterally. 2+DP/PT pulse bilaterally. Musculoskeletal: No joint deformities, No joint redness or warmth, no limitation of ROM in spin. Skin: No rashes.  Neuro: Alert, oriented X3, cranial nerves II-XII  grossly intact, moves all extremities. Psych: Patient is not psychotic, no suicidal or hemocidal ideation.  Labs on Admission: I have personally reviewed following labs and imaging studies  CBC: Recent Labs  Lab 07/12/20 0335 07/12/20 1356  WBC 15.4* 9.9  NEUTROABS 13.2*  --   HGB 11.8* 11.2*  HCT 37.7 36.1  MCV 94.0 95.0  PLT 575* 226*   Basic Metabolic Panel: Recent Labs  Lab 07/12/20 0335  NA 141  K 3.4*  CL 105  CO2 27  GLUCOSE 106*  BUN 18  CREATININE 0.73  CALCIUM 7.8*   GFR: Estimated Creatinine Clearance: 31.1 mL/min (by C-G formula based on SCr of 0.73 mg/dL). Liver Function Tests: Recent Labs  Lab 07/12/20 0335  AST 103*  ALT 35  ALKPHOS 114  BILITOT 0.8  PROT 6.5  ALBUMIN 2.5*   Recent Labs  Lab 07/12/20 0335  LIPASE 60*   No results for input(s): AMMONIA in the last 168 hours. Coagulation Profile: No results for input(s): INR, PROTIME in the last 168 hours. Cardiac Enzymes: No results for input(s): CKTOTAL, CKMB, CKMBINDEX, TROPONINI in the last 168 hours. BNP (last 3 results) No results for input(s): PROBNP in the last 8760 hours. HbA1C: No results for input(s): HGBA1C in the last 72 hours. CBG: No results for input(s): GLUCAP in the last 168 hours. Lipid Profile: No results for input(s): CHOL, HDL, LDLCALC, TRIG, CHOLHDL, LDLDIRECT in the last 72 hours. Thyroid Function Tests: No results for input(s): TSH, T4TOTAL, FREET4, T3FREE, THYROIDAB in the last 72 hours. Anemia Panel: No results for input(s): VITAMINB12, FOLATE, FERRITIN, TIBC, IRON, RETICCTPCT in the last 72 hours. Urine analysis:    Component Value Date/Time   COLORURINE YELLOW (A) 07/12/2020 1331   APPEARANCEUR CLEAR (A) 07/12/2020 1331   APPEARANCEUR HAZY 11/22/2013 1523   LABSPEC >1.046 (H) 07/12/2020 1331   LABSPEC SEE COMMENT 11/22/2013 1523   PHURINE 5.0 07/12/2020 1331   GLUCOSEU NEGATIVE 07/12/2020 1331   GLUCOSEU see comment 11/22/2013 1523   HGBUR MODERATE  (A) 07/12/2020 1331   BILIRUBINUR NEGATIVE 07/12/2020 1331   BILIRUBINUR neg 03/19/2017 1046   BILIRUBINUR SEE COMMENT 11/22/2013 1523   KETONESUR NEGATIVE 07/12/2020 1331   PROTEINUR 100 (A) 07/12/2020 1331   UROBILINOGEN 0.2 03/19/2017 1046   NITRITE NEGATIVE 07/12/2020 1331   LEUKOCYTESUR NEGATIVE 07/12/2020 1331   LEUKOCYTESUR SEE COMMENT 11/22/2013 1523   Sepsis Labs: @LABRCNTIP (procalcitonin:4,lacticidven:4) )No results found for this or any previous visit (from the past 240 hour(s)).  Radiological Exams on Admission: CT CHEST ABDOMEN PELVIS W CONTRAST  Result Date: 07/12/2020 CLINICAL DATA:  86 year old female with "large read bowel movement", decreased mental status, Chronic Emphysema (ICD10-J43.9) with widespread abnormal right lung opacity and right lung volume loss on chest x-ray new since 2019. EXAM: CT CHEST, ABDOMEN, AND PELVIS WITH CONTRAST TECHNIQUE: Multidetector CT imaging of the chest, abdomen and pelvis was performed following the standard protocol during bolus administration of intravenous contrast. CONTRAST:  75mL OMNIPAQUE IOHEXOL 300 MG/ML  SOLN COMPARISON:  Chest CT 11/12/2011.  CT Abdomen and Pelvis 09/06/2017. FINDINGS: CT CHEST FINDINGS Cardiovascular: Chronic Calcified aortic atherosclerosis. Mild right atrial enlargement. No pericardial effusion. Central pulmonary arteries appear to be patent. Mediastinum/Nodes: Chronic exophytic thyroid goiter at the thoracic inlet, stable since 2013. New bulky lymph node metastasis or metastatic tumor abutting the mediastinum along the left lateral margin of the ascending aorta on series 2, image 28, 22 mm. Heterogeneously enhancing lobulated tumor inseparable from the right hilum (series 2 image 31, 28). Tumor size up to 6.5 cm long axis (sagittal image 48, coronal image 60. Additional maximal 10 mm right precarinal lymph node suspicious for small nodal metastasis. Lungs/Pleura: Moderate size layering right pleural effusion with  simple fluid density superimposed on tumor extending from the right hilum to the peripheral pleura of the right mid chest (series 2, image 33). Surrounding lung consolidation. Short segment occlusion of the right upper lobe bronchus is associated on series 4, image 70. Superimposed enhancing atelectatic lung in the right middle and lower lobes. Underlying extensive centrilobular emphysema. Other major airways remain patent. New irregular and mildly spiculated opacity also in the left upper lung encompassing 10-15 mm (series 4, image 47. This is indeterminate for scarring versus synchronous primary. Musculoskeletal: Osteopenia. Right lateral ribs in proximity to the lung tumor remain intact. No acute or suspicious osseous lesion identified in the chest. CT ABDOMEN PELVIS FINDINGS Hepatobiliary: Chronic gallbladder sludge and/or stones. Patulous gallbladder today but no definite pericholecystic inflammation. Heterogeneous liver enhancement with no definite hepatic metastatic disease. Abnormal contour of the liver dome appears related to overlying pleural fluid and mild mass effect on the diaphragm, and abnormal pleural thickening and enhancement is identified on coronal image 29. Pancreas: Negative. Spleen: Negative. Adrenals/Urinary Tract: Adrenal glands remain within normal limits. Kidneys remain within normal limits. Urinary bladder mostly obscured by hip arthroplasty artifact. Stomach/Bowel: Lobular and enhancing rectal mass (series 2, image 123) new since 2019. This has indistinct margins, and is partially obscured by artifact from right hip arthroplasty, but is grossly estimated at 6.6 cm on series 2, image 120. No upstream dilated bowel. Decompressed sigmoid colon with extensive diverticulosis. Decompressed descending colon. Somewhat featureless large bowel loops elsewhere. Cecum partially located in the pelvis containing stool. Negative terminal ileum. Appendix cannot be delineated. Mostly decompressed  stomach. No free air.  No free fluid. Vascular/Lymphatic: Extensive Calcified aortic atherosclerosis. Chronic partially thrombosed distal aortic endograft is stable. The right iliac limb is chronically thrombosed. Reconstituted flow in the right iliac artery bifurcation. Furthermore, there is a chronic femoral bypass which has thrombosed since 2019 (series 2, image 99). No other acute arterial thrombosis identified in the abdomen or pelvis. Portal venous system is diminutive but patent. [id number] No lymphadenopathy identified in the abdomen. No definite lymphadenopathy in the pelvis. Reproductive: Uterus appears to be chronically absent. There are chronic bilateral ovarian cysts which have simple fluid density (series 2, image 103) and have not significantly changed since 2019 (series 2, image 99 on the left).  Other: Pelvis partially obscured by artifact from right hip arthroplasty. No definite pelvic free fluid. Musculoskeletal: Mild L2 superior endplate compression fracture is new since 2019 but has a chronic appearance. Chronic L4-L5 advanced disc and endplate degeneration. No acute or suspicious osseous lesion identified. Chronic right hip arthroplasty. IMPRESSION: 1. Advanced Malignancy in the chest. - lobulated and enhancing tumor encompassing up to 6.5 cm extending from the right hilum to the peripheral pleural surface. - abnormal enhancing pleura along the diaphragm. Associated small to moderate right pleural effusion (partially sub pulmonic). -mediastinal metastases. - a spiculated left upper lobe lung nodule indeterminate for scarring versus a synchronous primary carcinoma. - Emphysema (ICD10-J43.9). 2. Superimposed primary Rectal Carcinoma also suspected, with an indistinct thick and enhancing rectal tumor estimated at 6.6 cm. But no metastatic disease is identified in the abdomen. 3. Severe chronic vasculopathy also with thrombosis of a femoral artery bypass since 2019. Stable chronic Aortic Endograft with  thrombosed right iliac limb. Extensive Aortic Atherosclerosis (ICD10-I70.0). 4. Cholelithiasis with patulous gallbladder but no strong evidence of acute cholecystitis by CT. 5. Mild chronic appearing L2 compression fracture is new since 2019. Electronically Signed   By: Genevie Ann M.D.   On: 07/12/2020 06:58   DG Chest Portable 1 View  Result Date: 07/12/2020 CLINICAL DATA:  86 year old female with shortness of breath. EXAM: PORTABLE CHEST 1 VIEW COMPARISON:  Chest radiographs 06/30/2018 and earlier. FINDINGS: Portable AP upright view at 0345 hours. Chronic pulmonary hyperinflation with centrilobular emphysema. Moderately reduced volume in the right lung with widespread abnormal right lung opacity is new since 2019. Evidence of both airspace consolidation and right pleural effusion. Mediastinum somewhat deviated to the right. No pneumothorax. Left lung appears to remain clear. Calcified aortic atherosclerosis. Visualized tracheal air column is within normal limits. No acute osseous abnormality identified. IMPRESSION: Chronic Emphysema (ICD10-J43.9) with widespread abnormal right lung opacity and right lung volume loss, all new since 2019. Recommend Chest CT (IV contrast preferred) to further characterize. Electronically Signed   By: Genevie Ann M.D.   On: 07/12/2020 04:04     EKG: I have personally reviewed.  Sinus rhythm, QTC 514, LAE, LAD, poor R wave progression  Assessment/Plan Principal Problem:   Malignancy (HCC) Active Problems:   Dyslipidemia   Compulsive tobacco user syndrome   Essential (primary) hypertension   COPD (chronic obstructive pulmonary disease) (HCC)   Protein-calorie malnutrition, severe   AAA (abdominal aortic aneurysm) without rupture (HCC)   Rectal bleeding   Chronic pain syndrome   Chronic heart failure with preserved ejection fraction (HFpEF) (HCC)   Stroke (HCC)   PVD (peripheral vascular disease) (HCC)   Leukocytosis   Hypokalemia   Abnormal LFTs   Primary malignant  neoplasm of lung metastatic to other site [facility])   Acute blood loss anemia (ABLA)   Malignancy: possible primary malignant neoplasm of lung metastatic to other site and rectal cancer. I have extensive discussion with her son by phoen, who does not want patient to have any aggressive diagnostic test or treatment.  He wants patient to be hospice care, but no bed available today.  -Admit to MedSurg bed as inpatient -Supportive care -As needed oxycodone for pain -As needed hydroxyzine for nausea -Palliative consult  Rectal bleeding: Likely due to rectal cancer.  Hemoglobin slightly dropped from 12.0 on 01/20/2019 1-11.8. -2 large IV -Protonix 40 mg twice daily -Follow-up CBC every 6 hours -Hold Plavix  Dyslipidemia -lipitor  Compulsive tobacco user syndrome -Nicotine patch  Essential (primary) hypertension -IV hydralazine  as needed -Atenolol, Lasix,  COPD (chronic obstructive pulmonary disease) ([place]): Stable -As needed albuterol  Protein-calorie malnutrition, severe -Start Ensure  AAA (abdominal aortic aneurysm) without rupture [facility]): s/p repair -No acute issue  Chronic pain syndrome -Switch Norco to as needed oxycodone to avoid Tylenol since patient has abnormal liver function  Chronic heart failure with preserved ejection fraction (HFpEF) ([place]): 2D echo 05/21/2018 showed EF of 50-55%.  No leg edema JVD.  CHF is compensated. -Continue Lasix -Check BNP  Stroke (HCC) -Hold Plavix due to rectal bleeding -Continue Lipitor  PVD (peripheral vascular disease) (HCC) -Continue Lipitor -Hold Plavix  Leukocytosis: WBC 15.4, no source of infection identified, no fever, likely reactive -Follow-up with CBC  Hypokalemia: K= 3.4 on admission. - Repleted - Check Mg level  Abnormal LFTs: May be related to metastasized disease -Avoid using Tylenol  Acute blood loss anemia (ABLA): Hemoglobin 12.0 --> 11.8 -Follow-up of a CBC as above         DVT ppx: SCD Code Status: DNR  per her son Family Communication:  Yes, patient's son by phone Disposition Plan:  Anticipate discharge back to hospice environment Consults called:  none Admission status: Med-surg bed as inpt          Status is: Inpatient  Remains inpatient appropriate because:Inpatient level of care appropriate due to severity of illness.  Patient has has multiple comorbidities, now presents with advanced malignancy in chest, possible primary malignant neoplasm of lung metastatic to other side, and also possible primary rectal carcinoma by CT scan.  In addition patient has leukocytosis, hypokalemia, abnormal liver function.  Her presentation is highly complicated patient at high risk of deteriorating.  Need to be treated in hospital for at least 2 days, waiting for hospice bed available.  Dispo: The patient is from: Home              Anticipated d/c is to: hospice               Anticipated d/c date is: 2 days              Patient currently is not medically stable to d/c.           Date of Service 07/12/2020    Ivor Costa [facility]   If 7PM-7AM, please contact night-coverage [email or web address] 07/12/2020, 5:15 PM

## 2020-07-12 NOTE — Progress Notes (Signed)
[facility] hospital Liaison note: New referral for family interest in [place] home received from Palliative NP [place], [place] made aware.  Patient information sent to referral, hospice home eligibility has been confirmed. Writer spoke via telephone to patient's son Annie Main to initiate eduction regarding hospice services, philosophy, team approach to care and current visitation policy with understanding voiced.   Annie Main and the hospital care team were made aware that at this time, [facility] was unable to offer a bed today.  Liaisons will follow and update all regarding bed availability. Thank you for the opportunity to be involved in the care of this patient and her family.  Flo Shanks BSN, RN, [place] [phone]

## 2020-07-12 NOTE — ED Notes (Signed)
Purple discoloration noted to the toes. ER provider aware.

## 2020-07-13 DIAGNOSIS — C801 Malignant (primary) neoplasm, unspecified: Secondary | ICD-10-CM

## 2020-07-13 DIAGNOSIS — Z515 Encounter for palliative care: Secondary | ICD-10-CM

## 2020-07-13 LAB — CBC
HCT: 33.3 % — ABNORMAL LOW (ref 36.0–46.0)
HCT: 34.2 % — ABNORMAL LOW (ref 36.0–46.0)
Hemoglobin: 10.3 g/dL — ABNORMAL LOW (ref 12.0–15.0)
Hemoglobin: 10.6 g/dL — ABNORMAL LOW (ref 12.0–15.0)
MCH: 29.6 pg (ref 26.0–34.0)
MCH: 29.9 pg (ref 26.0–34.0)
MCHC: 30.9 g/dL (ref 30.0–36.0)
MCHC: 31 g/dL (ref 30.0–36.0)
MCV: 95.5 fL (ref 80.0–100.0)
MCV: 96.5 fL (ref 80.0–100.0)
Platelets: 445 10*3/uL — ABNORMAL HIGH (ref 150–400)
Platelets: 453 10*3/uL — ABNORMAL HIGH (ref 150–400)
RBC: 3.45 MIL/uL — ABNORMAL LOW (ref 3.87–5.11)
RBC: 3.58 MIL/uL — ABNORMAL LOW (ref 3.87–5.11)
RDW: 18 % — ABNORMAL HIGH (ref 11.5–15.5)
RDW: 18.1 % — ABNORMAL HIGH (ref 11.5–15.5)
WBC: 9.1 10*3/uL (ref 4.0–10.5)
WBC: 9.8 10*3/uL (ref 4.0–10.5)
nRBC: 0 % (ref 0.0–0.2)
nRBC: 0 % (ref 0.0–0.2)

## 2020-07-13 LAB — BASIC METABOLIC PANEL
Anion gap: 5 (ref 5–15)
BUN: 13 mg/dL (ref 8–23)
CO2: 31 mmol/L (ref 22–32)
Calcium: 7.7 mg/dL — ABNORMAL LOW (ref 8.9–10.3)
Chloride: 105 mmol/L (ref 98–111)
Creatinine, Ser: 0.7 mg/dL (ref 0.44–1.00)
GFR, Estimated: >60 mL/min (ref >60)
Glucose, Bld: 96 mg/dL (ref 70–99)
Potassium: 3.7 mmol/L (ref 3.5–5.1)
Sodium: 141 mmol/L (ref 135–145)

## 2020-07-13 LAB — MAGNESIUM: Magnesium: 1.7 mg/dL (ref 1.7–2.4)

## 2020-07-13 MED ORDER — LORAZEPAM 1 MG PO TABS: 1.0000 mg | ORAL_TABLET | ORAL | 0 refills | Status: AC | PRN

## 2020-07-13 MED ORDER — POLYVINYL ALCOHOL 1.4 % OP SOLN
1.0000 [drp] | Freq: Four times a day (QID) | OPHTHALMIC | 0 refills | Status: AC | PRN
Start: 2020-07-13 — End: ?

## 2020-07-13 MED ORDER — HYDROXYZINE HCL 10 MG PO TABS: 10.0000 mg | ORAL_TABLET | Freq: Three times a day (TID) | ORAL | 0 refills | Status: AC | PRN

## 2020-07-13 MED ORDER — HALOPERIDOL LACTATE 2 MG/ML PO CONC: 0.5000 mg | ORAL | 0 refills | Status: AC | PRN

## 2020-07-13 MED ORDER — MORPHINE SULFATE (PF) 2 MG/ML IV SOLN: 1.0000 mg | INTRAVENOUS | 0 refills | Status: AC | PRN

## 2020-07-13 MED ORDER — GLYCOPYRROLATE 1 MG PO TABS: 1.0000 mg | ORAL_TABLET | ORAL | Status: AC | PRN

## 2020-07-13 MED ORDER — ONDANSETRON 4 MG PO TBDP: 4.0000 mg | ORAL_TABLET | Freq: Four times a day (QID) | ORAL | 0 refills | Status: AC | PRN

## 2020-07-13 NOTE — TOC Transition Note (Signed)
Transition of Care [facility]) - CM/SW Discharge Note   Patient Details  Name: Tasha Reyes MRN: [id number] Date of Birth: [birth year]  Transition of Care [facility]) CM/SW Contact:  Magnus Ivan, LCSW Phone Number: 07/13/2020, 10:21 AM   Clinical Narrative:   Patient to discharge to [facility] in [place] today. [facility] Representatives have updated patient's son. They requested a 1:00 pick up time, [facility] transport arranged for 1:00 pick up. DNR and EMS Paperwork placed in Discharge Packet. RN and MD aware. No other TOC needs identified.    Final next level of care: [place] Barriers to Discharge: Barriers Resolved   Patient Goals and CMS Choice Patient states their goals for this hospitalization and ongoing recovery are:: hospice home CMS Medicare.gov Compare Post Acute Care list provided to:: Patient Represenative (must comment) Choice offered to / list presented to : Adult Children  Discharge Placement                Patient to be transferred to facility by: [facility] Name of family member notified: Son notified by [facility] Representatives Patient and family notified of of transfer: 07/13/20  Discharge Plan and Services                                     Social Determinants of Health (SDOH) Interventions     Readmission Risk Interventions No flowsheet data found.

## 2020-07-13 NOTE — Progress Notes (Signed)
Nutrition Brief Note  Chart reviewed. Patient has transitioned to comfort care.   No nutrition interventions warranted at this time. Please consult RD as needed.   Jacklynn Barnacle, MS, RD, LDN Pager number available on Amion

## 2020-07-13 NOTE — Progress Notes (Signed)
Daily Progress Note   Patient Name: Tasha Reyes       Date: 07/13/2020 DOB: 30-Jun-1931  Age: 86 y.o. MRN#: [id number] Attending Physician: Antonieta Pert, MD Primary Care Physician: Glean Hess, MD Admit Date: 07/12/2020  Reason for Consultation/Follow-up: Terminal Care  Subjective: Patient is resting in bed. No family at bedside. Patient does not respond to voice or touch. Respirations are even and unlabored. She appears comfortable; no changes to regimen recommended.  Awaiting hospice facility placement.   Length of Stay: 1  Current Medications: Scheduled Meds:  . atenolol  25 mg Oral BID  . atorvastatin  10 mg Oral Daily  . feeding supplement  237 mL Oral BID BM  . furosemide  20 mg Oral Daily  . nicotine  21 mg Transdermal Daily  . pantoprazole (PROTONIX) IV  40 mg Intravenous Q12H    Continuous Infusions:   PRN Meds: acetaminophen **OR** acetaminophen, albuterol, antiseptic oral rinse, glycopyrrolate **OR** glycopyrrolate **OR** glycopyrrolate, haloperidol **OR** haloperidol **OR** haloperidol lactate, hydrALAZINE, hydrOXYzine, LORazepam **OR** LORazepam **OR** LORazepam, morphine injection, ondansetron **OR** ondansetron (ZOFRAN) IV, polyvinyl alcohol  Physical Exam Constitutional:      Comments: Resting with eyes closed. Does not respond to voice or touch.              Vital Signs: BP (!) 156/83 (BP Location: Right Arm)   Pulse 60   Temp 97.8 F (36.6 C)   Resp 16   Ht 5\' 3"  (1.6 m)   Wt 39.8 kg   SpO2 (!) 88%   BMI 15.54 kg/m  SpO2: SpO2: (!) 88 % O2 Device: O2 Device: Nasal Cannula O2 Flow Rate: O2 Flow Rate (L/min): 2 L/min  Intake/output summary:   Intake/Output Summary (Last 24 hours) at 07/13/2020 1228 Last data filed at 07/12/2020  1339 Gross per 24 hour  Intake --  Output 60 ml  Net -60 ml   LBM:   Baseline Weight: Weight: 39.8 kg Most recent weight: Weight: 39.8 kg       Palliative Assessment/Data:      Patient Active Problem List   Diagnosis Date Noted  . Hospice care patient 07/13/2020  . Palliative care patient 07/13/2020  . Malignancy ([place]) 07/12/2020  . Abnormal LFTs 07/12/2020  . Acute blood loss anemia (ABLA) 07/12/2020  . Stroke ([place])   .  PVD (peripheral vascular disease) ([place])   . Leukocytosis   . Hypokalemia   . Primary malignant neoplasm of lung metastatic to other site [facility])   . Rectal cancer ([place])   . Unspecified inflammatory spondylopathy, lumbar region ([place]) 11/17/2019  . Chronic musculoskeletal pain 01/11/2019  . Pharmacologic therapy 01/11/2019  . Problems influencing health status 01/11/2019  . Chronic heart failure with preserved ejection fraction (HFpEF) ([place]) 11/11/2018  . Chronic diastolic heart failure ([place]) 05/29/2018  . Malnutrition of moderate degree 05/22/2018  . Chronic pain syndrome 03/02/2018  . Chronic low back pain (Secondary area of Pain) (Bilateral) w/ sciatica (Bilateral) (R>L) 02/24/2018  . Bilateral leg numbness 02/24/2018  . Accidental medication overdose 02/22/2018  . DDD (degenerative disc disease), lumbar 02/03/2018  . Chronic anticoagulation (Plavix) 02/03/2018  . Chronic lower extremity pain (Bilateral) (R>L) 01/20/2018  . Disorder of skeletal system 01/20/2018  . Chronic hip pain (Right) 10/21/2017  . Rectal bleeding 09/06/2017  . History of repair of hip fracture 06/16/2017  . AAA (abdominal aortic aneurysm) without rupture ([place]) 03/18/2017  . Edema due to malnutrition ([place]) 10/28/2016  . Dependence on supplemental oxygen 09/04/2016  . Protein-calorie malnutrition, severe 08/20/2016  . Nausea with vomiting 07/30/2016  . Vitamin D deficiency 01/10/2016  . Opioid-induced constipation ([facility]) 11/07/2015  . Osteoporosis, post-menopausal 11/07/2015   . Complaints of weakness of lower extremities (Bilateral) 11/07/2015  . Lumbar foraminal stenosis (Right) (Severe at L4-5) 06/20/2015  . Chronic lower extremity pain (Primary Area of Pain) (Right) 05/11/2015  . Lumbosacral Radiculopathy (Right) 05/11/2015  . Chronic radicular pain of lower extremity (Right) 05/11/2015  . Peripheral vascular disease ([place]) 05/11/2015  . Hx of long term use of blood thinners (Plavix) 05/11/2015  . Lower extremity pain (Right) 05/11/2015  . Neurogenic pain 05/11/2015  . History of DVT (deep vein thrombosis) 05/11/2015  . Lumbar facet syndrome (Bilateral) 05/11/2015  . Dyslipidemia 12/23/2014  . Compulsive tobacco user syndrome 12/23/2014  . History of bleeding peptic ulcer 12/23/2014  . Essential (primary) hypertension 12/23/2014  . Depression, major, single episode, complete remission ([place]) 12/23/2014  . COPD (chronic obstructive pulmonary disease) ([place]) 12/23/2014  . Restless leg 12/23/2014  . Chronic tension-type headache, intractable 12/23/2014    Palliative Care Assessment & Plan    Recommendations/Plan:  Waiting for hospice facility placement.    Code Status:    Code Status Orders  (From admission, onward)         Start     Ordered   07/12/20 1402  Do not attempt resuscitation (DNR)  Continuous       Question Answer Comment  In the event of cardiac or respiratory ARREST Do not call a "code blue"   In the event of cardiac or respiratory ARREST Do not perform Intubation, CPR, defibrillation or ACLS   In the event of cardiac or respiratory ARREST Use medication by any route, position, wound care, and other measures to relive pain and suffering. May use oxygen, suction and manual treatment of airway obstruction as needed for comfort.   Comments MOST form on chart.      07/12/20 1401        Code Status History    Date Active Date Inactive Code Status Order ID Comments User Context   07/12/2020 1042 07/12/2020 1401 DNR [id number]   Asencion Gowda, NP ED   05/20/2018 1928 05/23/2018 1608 Full Code [id number]  Epifanio Lesches, MD ED   09/06/2017 2256 09/08/2017 1822 Full Code [id number]  Henreitta Leber, MD Inpatient  06/16/2017 0813 06/21/2017 1232 Full Code [id number]  Harrie Foreman, MD Inpatient   08/19/2016 2014 08/21/2016 1834 Full Code [id number]  Nicholes Mango, MD Inpatient   Advance Care Planning Activity       Prognosis:   < 2 weeks  Discharge Planning:  Hospice facility  Care plan was discussed with primary MD.   Thank you for allowing the Palliative Medicine Team to assist in the care of this patient.   Total Time 15 min Prolonged Time Billed  no      Greater than 50%  of this time was spent counseling and coordinating care related to the above assessment and plan.  Asencion Gowda, NP  Please contact Palliative Medicine Team phone at [phone] for questions and concerns.

## 2020-07-13 NOTE — Progress Notes (Signed)
Called report to hospice home

## 2020-07-13 NOTE — Progress Notes (Signed)
[facility]) Hospital Liaison RN note:  [facility] does have a bed to offer today. Registration paperwork to be completed by son, Annie Main at the [facility] [place]. Transportation to be arranged for [facility] care team is aware.  Thank you for the opportunity to participate in this patient's care.  Zandra Abts, RN [facility] Liaison [phone]

## 2020-07-13 NOTE — Discharge Summary (Addendum)
Physician Discharge Summary  Tasha Reyes FMB:[id number] DOB: October 13, 1930 DOA: 07/12/2020  PCP: Glean Hess, MD  Admit date: 07/12/2020 Discharge date: 07/13/2020  Admitted From: home Disposition:  hospice  Recommendations for Outpatient Follow-up:  Follow up with hospice  Home Health:no  Equipment/Devices: none  Discharge Condition: Stable Code Status:   Code Status: DNR Diet recommendation:  Diet Order            Diet Heart Room service appropriate? Yes; Fluid consistency: Thin  Diet effective now                  Brief/Interim Summary: Dyspnea female with history of AAA status post repair COPD on 2 L nasal cannula, DVT not on anticoagulation, malnutrition, diastolic CHF, hypertension, hyperlipidemia, history of stroke on plavix , GERD, depression/anxiety, PVD, atrial bleeding, migraine headache presented with abdominal pain. History provided by the son as per the report-pt was complaining of abdominal pain and had a large bloody bowel movement along with ausea, and has had diarrhea recently. Her abdominal pain is diffuse, constant, moderate, sharp, nonradiating. Patient has mild shortness of breath, denies chest pain, cough, fever or chills. Her oxygen saturation is 97% on home level of 2 L oxygen.  No symptoms of UTI.  ED Course: pt was found to have WBC 15.4, pending COVID-19 PCR, potassium 3.4, renal function okay, abnormal liver function (ALP 114, AST 103, AST 35, total bilirubin 0.8), temperature normal, blood pressure 162/63, heart rate 82, RR 23.  Chest x-ray showed COPD. CT abdomen pelvis chest was very concerning with the following findings: 1. Advanced Malignancy in the chest. - lobulated and enhancing tumor encompassing up to 6.5 cm extending from the right hilum to the peripheral pleural surface. - abnormal enhancing pleura along the diaphragm. Associated small to moderate right pleural effusion (partially sub pulmonic). -mediastinal metastases. - a  spiculated left upper lobe lung nodule indeterminate for scarring versus a synchronous primary carcinoma. - Emphysema (ICD10-J43.9).  2. Superimposed primary Rectal Carcinoma also suspected, with an indistinct thick and enhancing rectal tumor estimated at 6.6 cm. But no metastatic disease is identified in the abdomen.  3. Severe chronic vasculopathy also with thrombosis of a femoral artery bypass since 2019. 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 Extensive Aortic Atherosclerosis (ICD10-I70.0).  4. Cholelithiasis with patulous gallbladder but no strong evidence of acute cholecystitis by CT. Palliative care was consulted patient wanted to focus on comfort until she dies and on longer wants to pursue further treatment and hospice was referred.  No bed available and patient is admitted for comfort measures She had a bed at hospice and being discharged today after discussion with family by the [facility] team.  Discharge Diagnoses:   Malignancy: possible primary malignant neoplasm of lung metastatic to other site and rectal cancer- please see CT report as above. On admission MD had extensive discussion with son and palliative care is involved and did not want to pursue aggressive diagnostic or further testing or treatment and patient is admitted for comfort measures.  Continue pain control supportive measures nausea medication.  Rectal bleeding with acute blood loss anemia: Likely due to rectal cancer.  With acute blood loss anemia. No furtehr h/h draw for comfort.Cont ppi. Hold plavix Recent Labs  Lab 07/12/20 0335 07/12/20 1356 07/12/20 1833 07/12/20 2345 07/13/20 0532  HGB 11.8* 11.2* 10.8* 10.3* 10.6*  HCT 37.7 36.1 34.9* 33.3* 34.2*   Other issues are chronic including:  Dyslipidemia Tobacco use disorder  Essential hypertension  COPD with chronic hypoxic respiratory failure on 2l  nasal cannula at home  Protein calorie malnutrition severe AAA s/p  repair Chronic pain syndrome managed with pain meds Heart failure with preserved EF History of stroke on Plavix - held 2/2 acute rectal bleeding PVD Abnormality/transaminitis likely from suspected malignancy Hypokalemia Since FOCUS is for comfort measures her Plavix antihypertensive diuretics has been discontinued to focus on comfort.  Consults:  PMT  Subjective: Minimally responding.  Discharge Exam: Vitals:   07/12/20 2023 07/13/20 0846  BP: 119/63 (!) 156/83  Pulse: 75 60  Resp:  16  Temp: 98.3 F (36.8 C) 97.8 F (36.6 C)  SpO2: 98% (!) 88%   General: Pt is sleepy,thin frail Cardiovascular: RRR, S1/S2 +, no rubs, no gallops Respiratory: CTA bilaterally, no wheezing, no rhonchi Abdominal: Soft, NT, ND, bowel sounds + Extremities: no edema, no cyanosis  Discharge Instructions   Allergies as of 07/13/2020      Reactions   Other Nausea And Vomiting   Cherries (fruit)      Medication List    STOP taking these medications   atenolol 25 MG tablet Commonly known as: TENORMIN   atorvastatin 10 MG tablet Commonly known as: LIPITOR   clopidogrel 75 MG tablet Commonly known as: PLAVIX   furosemide 20 MG tablet Commonly known as: LASIX   HYDROcodone-acetaminophen 5-325 MG tablet Commonly known as: NORCO/VICODIN   hydrOXYzine 25 MG capsule Commonly known as: Vistaril   meclizine 25 MG tablet Commonly known as: ANTIVERT   pantoprazole 40 MG tablet Commonly known as: PROTONIX     TAKE these medications   albuterol (2.5 MG/3ML) 0.083% nebulizer solution Commonly known as: PROVENTIL USE 1 VIAL VIA NEBULIZER EVERY 8 HOURS   glycopyrrolate 1 MG tablet Commonly known as: ROBINUL Take 1 tablet (1 mg total) by mouth every 4 (four) hours as needed (excessive secretions).   haloperidol 2 MG/ML solution Commonly known as: HALDOL Place 0.3 mLs (0.6 mg total) under the tongue every 4 (four) hours as needed for agitation (or delirium).   hydrOXYzine 10 MG  tablet Commonly known as: ATARAX/VISTARIL Take 1 tablet (10 mg total) by mouth 3 (three) times daily as needed for nausea.   LORazepam 1 MG tablet Commonly known as: ATIVAN Take 1 tablet (1 mg total) by mouth every 4 (four) hours as needed for anxiety.   morphine 2 MG/ML injection Inject 0.5 mLs (1 mg total) into the vein every 30 (thirty) minutes as needed (or dyspnea).   ondansetron 4 MG disintegrating tablet Commonly known as: ZOFRAN-ODT Take 1 tablet (4 mg total) by mouth every 6 (six) hours as needed for nausea. What changed:   medication strength  See the new instructions.   OXYGEN Inhale 2 L/min into the lungs. Use via nasal cannula at night and during the day as needed   polyvinyl alcohol 1.4 % ophthalmic solution Commonly known as: LIQUIFILM TEARS Place 1 drop into both eyes 4 (four) times daily as needed for dry eyes.       Allergies  Allergen Reactions  . Other Nausea And Vomiting    Cherries (fruit)    The results of significant diagnostics from this hospitalization (including imaging, microbiology, ancillary and laboratory) are listed below for reference.    Microbiology: Recent Results (from the past 240 hour(s))  SARS CORONAVIRUS 2 (TAT 6-24 HRS) Nasopharyngeal Nasopharyngeal Swab     Status: None   Collection Time: 07/12/20 11:14 AM   Specimen: Nasopharyngeal Swab  Result Value Ref Range  Status   SARS Coronavirus 2 NEGATIVE NEGATIVE Final    Comment: (NOTE) SARS-CoV-2 target nucleic acids are NOT DETECTED.  The SARS-CoV-2 RNA is generally detectable in upper and lower respiratory specimens during the acute phase of infection. Negative results do not preclude SARS-CoV-2 infection, do not rule out co-infections with other pathogens, and should not be used as the sole basis for treatment or other patient management decisions. Negative results must be combined with clinical observations, patient history, and epidemiological information. The  expected result is Negative.  Fact Sheet for Patients: SugarRoll.be  Fact Sheet for Healthcare Providers: https://www.woods-mathews.com/  This test is not yet approved or cleared by the [place] FDA and  has been authorized for detection and/or diagnosis of SARS-CoV-2 by FDA under an Emergency Use Authorization (EUA). This EUA will remain  in effect (meaning this test can be used) for the duration of the COVID-19 declaration under Se ction 564(b)(1) of the Act, 21 U.S.C. section 360bbb-3(b)(1), unless the authorization is terminated or revoked sooner.  Performed at [facility], [place] [address] [place], [place] [address]     Procedures/Studies: CT CHEST ABDOMEN PELVIS W CONTRAST  Result Date: 07/12/2020 CLINICAL DATA:  86 year old female with "large read bowel movement", decreased mental status, Chronic Emphysema (ICD10-J43.9) with widespread abnormal right lung opacity and right lung volume loss on chest x-ray new since 2019. EXAM: CT CHEST, ABDOMEN, AND PELVIS WITH CONTRAST TECHNIQUE: Multidetector CT imaging of the chest, abdomen and pelvis was performed following the standard protocol during bolus administration of intravenous contrast. CONTRAST:  58mL OMNIPAQUE IOHEXOL 300 MG/ML  SOLN COMPARISON:  Chest CT 11/12/2011.  CT Abdomen and Pelvis 09/06/2017. FINDINGS: CT CHEST FINDINGS Cardiovascular: Chronic Calcified aortic atherosclerosis. Mild right atrial enlargement. No pericardial effusion. Central pulmonary arteries appear to be patent. Mediastinum/Nodes: Chronic exophytic thyroid goiter at the thoracic inlet, stable since 2013. New bulky lymph node metastasis or metastatic tumor abutting the mediastinum along the left lateral margin of the ascending aorta on series 2, image 28, 22 mm. Heterogeneously enhancing lobulated tumor inseparable from the right hilum (series 2 image 31, 28). Tumor size up to 6.5 cm long axis (sagittal image  48, coronal image 60. Additional maximal 10 mm right precarinal lymph node suspicious for small nodal metastasis. Lungs/Pleura: Moderate size layering right pleural effusion with simple fluid density superimposed on tumor extending from the right hilum to the peripheral pleura of the right mid chest (series 2, image 33). Surrounding lung consolidation. Short segment occlusion of the right upper lobe bronchus is associated on series 4, image 70. Superimposed enhancing atelectatic lung in the right middle and lower lobes. Underlying extensive centrilobular emphysema. Other major airways remain patent. New irregular and mildly spiculated opacity also in the left upper lung encompassing 10-15 mm (series 4, image 47. This is indeterminate for scarring versus synchronous primary. Musculoskeletal: Osteopenia. Right lateral ribs in proximity to the lung tumor remain intact. No acute or suspicious osseous lesion identified in the chest. CT ABDOMEN PELVIS FINDINGS Hepatobiliary: Chronic gallbladder sludge and/or stones. Patulous gallbladder today but no definite pericholecystic inflammation. Heterogeneous liver enhancement with no definite hepatic metastatic disease. Abnormal contour of the liver dome appears related to overlying pleural fluid and mild mass effect on the diaphragm, and abnormal pleural thickening and enhancement is identified on coronal image 29. Pancreas: Negative. Spleen: Negative. Adrenals/Urinary Tract: Adrenal glands remain within normal limits. Kidneys remain within normal limits. Urinary bladder mostly obscured by hip arthroplasty artifact. Stomach/Bowel: Lobular and enhancing rectal  mass (series 2, image 123) new since 2019. This has indistinct margins, and is partially obscured by artifact from right hip arthroplasty, but is grossly estimated at 6.6 cm on series 2, image 120. No upstream dilated bowel. Decompressed sigmoid colon with extensive diverticulosis. Decompressed descending colon. Somewhat  featureless large bowel loops elsewhere. Cecum partially located in the pelvis containing stool. Negative terminal ileum. Appendix cannot be delineated. Mostly decompressed stomach. No free air.  No free fluid. Vascular/Lymphatic: Extensive Calcified aortic atherosclerosis. Chronic partially thrombosed distal aortic endograft is stable. The right iliac limb is chronically thrombosed. Reconstituted flow in the right iliac artery bifurcation. Furthermore, there is a chronic femoral bypass which has thrombosed since 2019 (series 2, image 99). No other acute arterial thrombosis identified in the abdomen or pelvis. Portal venous system is diminutive but patent. [id number] No lymphadenopathy identified in the abdomen. No definite lymphadenopathy in the pelvis. Reproductive: Uterus appears to be chronically absent. There are chronic bilateral ovarian cysts which have simple fluid density (series 2, image 103) and have not significantly changed since 2019 (series 2, image 99 on the left). Other: Pelvis partially obscured by artifact from right hip arthroplasty. No definite pelvic free fluid. Musculoskeletal: Mild L2 superior endplate compression fracture is new since 2019 but has a chronic appearance. Chronic L4-L5 advanced disc and endplate degeneration. No acute or suspicious osseous lesion identified. Chronic right hip arthroplasty. IMPRESSION: 1. Advanced Malignancy in the chest. - lobulated and enhancing tumor encompassing up to 6.5 cm extending from the right hilum to the peripheral pleural surface. - abnormal enhancing pleura along the diaphragm. Associated small to moderate right pleural effusion (partially sub pulmonic). -mediastinal metastases. - a spiculated left upper lobe lung nodule indeterminate for scarring versus a synchronous primary carcinoma. - Emphysema (ICD10-J43.9). 2. Superimposed primary Rectal Carcinoma also suspected, with an indistinct thick and enhancing rectal tumor estimated at 6.6 cm. But no  metastatic disease is identified in the abdomen. 3. Severe chronic vasculopathy also with thrombosis of a femoral artery bypass since 2019. 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 Extensive Aortic Atherosclerosis (ICD10-I70.0). 4. Cholelithiasis with patulous gallbladder but no strong evidence of acute cholecystitis by CT. 5. Mild chronic appearing L2 compression fracture is new since 2019. Electronically Signed   By: Genevie Ann M.D.   On: 07/12/2020 06:58   DG Chest Portable 1 View  Result Date: 07/12/2020 CLINICAL DATA:  86 year old female with shortness of breath. EXAM: PORTABLE CHEST 1 VIEW COMPARISON:  Chest radiographs 06/30/2018 and earlier. FINDINGS: Portable AP upright view at 0345 hours. Chronic pulmonary hyperinflation with centrilobular emphysema. Moderately reduced volume in the right lung with widespread abnormal right lung opacity is new since 2019. Evidence of both airspace consolidation and right pleural effusion. Mediastinum somewhat deviated to the right. No pneumothorax. Left lung appears to remain clear. Calcified aortic atherosclerosis. Visualized tracheal air column is within normal limits. No acute osseous abnormality identified. IMPRESSION: Chronic Emphysema (ICD10-J43.9) with widespread abnormal right lung opacity and right lung volume loss, all new since 2019. Recommend Chest CT (IV contrast preferred) to further characterize. Electronically Signed   By: Genevie Ann M.D.   On: 07/12/2020 04:04    Labs: BNP (last 3 results) Recent Labs    07/12/20 0335  BNP 454.0*   Basic Metabolic Panel: Recent Labs  Lab 07/12/20 0335 07/13/20 0532  NA 141 141  K 3.4* 3.7  CL 105 105  CO2 27 31  GLUCOSE 106* 96  BUN 18 13  CREATININE  0.73 0.70  CALCIUM 7.8* 7.7*  MG  --  1.7   Liver Function Tests: Recent Labs  Lab 07/12/20 0335  AST 103*  ALT 35  ALKPHOS 114  BILITOT 0.8  PROT 6.5  ALBUMIN 2.5*   Recent Labs  Lab 07/12/20 0335  LIPASE 60*    No results for input(s): AMMONIA in the last 168 hours. CBC: Recent Labs  Lab 07/12/20 0335 07/12/20 1356 07/12/20 1833 07/12/20 2345 07/13/20 0532  WBC 15.4* 9.9 10.7* 9.8 9.1  NEUTROABS 13.2*  --   --   --   --   HGB 11.8* 11.2* 10.8* 10.3* 10.6*  HCT 37.7 36.1 34.9* 33.3* 34.2*  MCV 94.0 95.0 95.4 96.5 95.5  PLT 575* 485* 518* 453* 445*   Cardiac Enzymes: No results for input(s): CKTOTAL, CKMB, CKMBINDEX, TROPONINI in the last 168 hours. BNP: Invalid input(s): POCBNP CBG: No results for input(s): GLUCAP in the last 168 hours. D-Dimer No results for input(s): DDIMER in the last 72 hours. Hgb A1c No results for input(s): HGBA1C in the last 72 hours. Lipid Profile No results for input(s): CHOL, HDL, LDLCALC, TRIG, CHOLHDL, LDLDIRECT in the last 72 hours. Thyroid function studies No results for input(s): TSH, T4TOTAL, T3FREE, THYROIDAB in the last 72 hours.  Invalid input(s): FREET3 Anemia work up No results for input(s): VITAMINB12, FOLATE, FERRITIN, TIBC, IRON, RETICCTPCT in the last 72 hours. Urinalysis    Component Value Date/Time   COLORURINE YELLOW (A) 07/12/2020 1331   APPEARANCEUR CLEAR (A) 07/12/2020 1331   APPEARANCEUR HAZY 11/22/2013 1523   LABSPEC >1.046 (H) 07/12/2020 1331   LABSPEC SEE COMMENT 11/22/2013 1523   PHURINE 5.0 07/12/2020 1331   GLUCOSEU NEGATIVE 07/12/2020 1331   GLUCOSEU see comment 11/22/2013 1523   HGBUR MODERATE (A) 07/12/2020 1331   BILIRUBINUR NEGATIVE 07/12/2020 1331   BILIRUBINUR neg 03/19/2017 1046   BILIRUBINUR SEE COMMENT 11/22/2013 1523   KETONESUR NEGATIVE 07/12/2020 1331   PROTEINUR 100 (A) 07/12/2020 1331   UROBILINOGEN 0.2 03/19/2017 1046   NITRITE NEGATIVE 07/12/2020 1331   LEUKOCYTESUR NEGATIVE 07/12/2020 1331   LEUKOCYTESUR SEE COMMENT 11/22/2013 1523   Sepsis Labs Invalid input(s): PROCALCITONIN,  WBC,  LACTICIDVEN Microbiology Recent Results (from the past 240 hour(s))  SARS CORONAVIRUS 2 (TAT 6-24 HRS)  Nasopharyngeal Nasopharyngeal Swab     Status: None   Collection Time: 07/12/20 11:14 AM   Specimen: Nasopharyngeal Swab  Result Value Ref Range Status   SARS Coronavirus 2 NEGATIVE NEGATIVE Final    Comment: (NOTE) SARS-CoV-2 target nucleic acids are NOT DETECTED.  The SARS-CoV-2 RNA is generally detectable in upper and lower respiratory specimens during the acute phase of infection. Negative results do not preclude SARS-CoV-2 infection, do not rule out co-infections with other pathogens, and should not be used as the sole basis for treatment or other patient management decisions. Negative results must be combined with clinical observations, patient history, and epidemiological information. The expected result is Negative.  Fact Sheet for Patients: SugarRoll.be  Fact Sheet for Healthcare Providers: https://www.woods-mathews.com/  This test is not yet approved or cleared by the [place] FDA and  has been authorized for detection and/or diagnosis of SARS-CoV-2 by FDA under an Emergency Use Authorization (EUA). This EUA will remain  in effect (meaning this test can be used) for the duration of the COVID-19 declaration under Se ction 564(b)(1) of the Act, 21 U.S.C. section 360bbb-3(b)(1), unless the authorization is terminated or revoked sooner.  Performed at [facility], [place]  [address] [place], [place] [address]      Time coordinating discharge: 35 minutes  SIGNED: Antonieta Pert, MD  [facility] 07/13/2020, 12:45 PM  If 7PM-7AM, please contact night-coverage [email or web address]

## 2020-07-15 DIAGNOSIS — M4856XD Collapsed vertebra, not elsewhere classified, lumbar region, subsequent encounter for fracture with routine healing: Secondary | ICD-10-CM | POA: Diagnosis not present

## 2020-07-15 DIAGNOSIS — G4733 Obstructive sleep apnea (adult) (pediatric): Secondary | ICD-10-CM | POA: Diagnosis not present

## 2020-07-15 DIAGNOSIS — C7989 Secondary malignant neoplasm of other specified sites: Secondary | ICD-10-CM | POA: Diagnosis not present

## 2020-07-15 DIAGNOSIS — Z8711 Personal history of peptic ulcer disease: Secondary | ICD-10-CM | POA: Diagnosis not present

## 2020-07-15 DIAGNOSIS — K5790 Diverticulosis of intestine, part unspecified, without perforation or abscess without bleeding: Secondary | ICD-10-CM | POA: Diagnosis not present

## 2020-07-15 DIAGNOSIS — E43 Unspecified severe protein-calorie malnutrition: Secondary | ICD-10-CM | POA: Diagnosis not present

## 2020-07-15 DIAGNOSIS — M5116 Intervertebral disc disorders with radiculopathy, lumbar region: Secondary | ICD-10-CM | POA: Diagnosis not present

## 2020-07-15 DIAGNOSIS — I739 Peripheral vascular disease, unspecified: Secondary | ICD-10-CM | POA: Diagnosis not present

## 2020-07-15 DIAGNOSIS — I723 Aneurysm of iliac artery: Secondary | ICD-10-CM | POA: Diagnosis not present

## 2020-07-15 DIAGNOSIS — Z87448 Personal history of other diseases of urinary system: Secondary | ICD-10-CM | POA: Diagnosis not present

## 2020-07-15 DIAGNOSIS — M549 Dorsalgia, unspecified: Secondary | ICD-10-CM | POA: Diagnosis not present

## 2020-07-15 DIAGNOSIS — E785 Hyperlipidemia, unspecified: Secondary | ICD-10-CM | POA: Diagnosis not present

## 2020-07-15 DIAGNOSIS — R634 Abnormal weight loss: Secondary | ICD-10-CM | POA: Diagnosis not present

## 2020-07-15 DIAGNOSIS — Z86718 Personal history of other venous thrombosis and embolism: Secondary | ICD-10-CM | POA: Diagnosis not present

## 2020-07-15 DIAGNOSIS — J439 Emphysema, unspecified: Secondary | ICD-10-CM | POA: Diagnosis not present

## 2020-07-15 DIAGNOSIS — C20 Malignant neoplasm of rectum: Secondary | ICD-10-CM | POA: Diagnosis not present

## 2020-07-15 DIAGNOSIS — I700 Atherosclerosis of aorta: Secondary | ICD-10-CM | POA: Diagnosis not present

## 2020-07-15 DIAGNOSIS — Z9981 Dependence on supplemental oxygen: Secondary | ICD-10-CM | POA: Diagnosis not present

## 2020-07-15 DIAGNOSIS — I5032 Chronic diastolic (congestive) heart failure: Secondary | ICD-10-CM | POA: Diagnosis not present

## 2020-07-15 DIAGNOSIS — J9611 Chronic respiratory failure with hypoxia: Secondary | ICD-10-CM | POA: Diagnosis not present

## 2020-07-15 DIAGNOSIS — F17210 Nicotine dependence, cigarettes, uncomplicated: Secondary | ICD-10-CM | POA: Diagnosis not present

## 2020-07-15 DIAGNOSIS — F418 Other specified anxiety disorders: Secondary | ICD-10-CM | POA: Diagnosis not present

## 2020-07-15 DIAGNOSIS — C7800 Secondary malignant neoplasm of unspecified lung: Secondary | ICD-10-CM | POA: Diagnosis not present

## 2020-07-15 DIAGNOSIS — I110 Hypertensive heart disease with heart failure: Secondary | ICD-10-CM | POA: Diagnosis not present

## 2020-07-15 DIAGNOSIS — I69318 Other symptoms and signs involving cognitive functions following cerebral infarction: Secondary | ICD-10-CM | POA: Diagnosis not present

## 2020-08-02 ENCOUNTER — Encounter: Payer: Medicare Other | Admitting: Pain Medicine

## 2020-08-15 DEATH — deceased
